# Patient Record
Sex: Female | Born: 1940 | Race: White | Hispanic: No | Marital: Married | Smoking: Current every day smoker
Health system: Southern US, Community
[De-identification: ages and names within clinical notes are randomized; demographics above are authoritative.]

## PROBLEM LIST (undated history)

## (undated) DIAGNOSIS — I480 Paroxysmal atrial fibrillation: Secondary | ICD-10-CM

## (undated) DIAGNOSIS — M549 Dorsalgia, unspecified: Secondary | ICD-10-CM

## (undated) DIAGNOSIS — Z720 Tobacco use: Secondary | ICD-10-CM

## (undated) DIAGNOSIS — I639 Cerebral infarction, unspecified: Secondary | ICD-10-CM

## (undated) DIAGNOSIS — H3530 Unspecified macular degeneration: Secondary | ICD-10-CM

## (undated) DIAGNOSIS — I2510 Atherosclerotic heart disease of native coronary artery without angina pectoris: Secondary | ICD-10-CM

## (undated) DIAGNOSIS — G8929 Other chronic pain: Secondary | ICD-10-CM

## (undated) DIAGNOSIS — H409 Unspecified glaucoma: Secondary | ICD-10-CM

## (undated) DIAGNOSIS — K219 Gastro-esophageal reflux disease without esophagitis: Secondary | ICD-10-CM

## (undated) DIAGNOSIS — E785 Hyperlipidemia, unspecified: Secondary | ICD-10-CM

## (undated) DIAGNOSIS — R002 Palpitations: Secondary | ICD-10-CM

## (undated) DIAGNOSIS — I10 Essential (primary) hypertension: Secondary | ICD-10-CM

## (undated) HISTORY — DX: Hyperlipidemia, unspecified: E78.5

## (undated) HISTORY — PX: TUBAL LIGATION: SHX77

## (undated) HISTORY — PX: CATARACT EXTRACTION: SUR2

## (undated) HISTORY — DX: Atherosclerotic heart disease of native coronary artery without angina pectoris: I25.10

## (undated) HISTORY — DX: Palpitations: R00.2

## (undated) HISTORY — DX: Tobacco use: Z72.0

## (undated) HISTORY — DX: Essential (primary) hypertension: I10

## (undated) HISTORY — DX: Unspecified macular degeneration: H35.30

## (undated) HISTORY — PX: APPENDECTOMY: SHX54

---

## 1999-08-18 ENCOUNTER — Ambulatory Visit (HOSPITAL_COMMUNITY): Admission: RE | Admit: 1999-08-18 | Discharge: 1999-08-18 | Payer: Self-pay | Admitting: Ophthalmology

## 2000-04-21 ENCOUNTER — Ambulatory Visit (HOSPITAL_COMMUNITY): Admission: RE | Admit: 2000-04-21 | Discharge: 2000-04-21 | Payer: Self-pay | Admitting: Cardiology

## 2000-06-07 ENCOUNTER — Ambulatory Visit (HOSPITAL_COMMUNITY): Admission: RE | Admit: 2000-06-07 | Discharge: 2000-06-07 | Payer: Self-pay | Admitting: Ophthalmology

## 2000-10-10 ENCOUNTER — Other Ambulatory Visit: Admission: RE | Admit: 2000-10-10 | Discharge: 2000-10-10 | Payer: Self-pay | Admitting: *Deleted

## 2000-10-21 ENCOUNTER — Encounter: Payer: Self-pay | Admitting: Emergency Medicine

## 2000-10-21 ENCOUNTER — Inpatient Hospital Stay (HOSPITAL_COMMUNITY): Admission: EM | Admit: 2000-10-21 | Discharge: 2000-10-23 | Payer: Self-pay | Admitting: Emergency Medicine

## 2000-10-25 ENCOUNTER — Ambulatory Visit (HOSPITAL_COMMUNITY): Admission: RE | Admit: 2000-10-25 | Discharge: 2000-10-25 | Payer: Self-pay | Admitting: Pulmonary Disease

## 2001-03-27 ENCOUNTER — Ambulatory Visit (HOSPITAL_COMMUNITY): Admission: RE | Admit: 2001-03-27 | Discharge: 2001-03-27 | Payer: Self-pay | Admitting: Internal Medicine

## 2001-09-16 ENCOUNTER — Inpatient Hospital Stay (HOSPITAL_COMMUNITY): Admission: EM | Admit: 2001-09-16 | Discharge: 2001-09-20 | Payer: Self-pay | Admitting: Internal Medicine

## 2001-09-16 ENCOUNTER — Encounter: Payer: Self-pay | Admitting: Internal Medicine

## 2001-09-20 ENCOUNTER — Encounter: Payer: Self-pay | Admitting: Internal Medicine

## 2001-11-02 ENCOUNTER — Ambulatory Visit (HOSPITAL_COMMUNITY): Admission: RE | Admit: 2001-11-02 | Discharge: 2001-11-02 | Payer: Self-pay | Admitting: Internal Medicine

## 2001-11-02 ENCOUNTER — Encounter: Payer: Self-pay | Admitting: Internal Medicine

## 2002-11-23 ENCOUNTER — Ambulatory Visit (HOSPITAL_COMMUNITY): Admission: RE | Admit: 2002-11-23 | Discharge: 2002-11-23 | Payer: Self-pay | Admitting: Internal Medicine

## 2002-11-23 ENCOUNTER — Encounter: Payer: Self-pay | Admitting: Internal Medicine

## 2002-11-28 ENCOUNTER — Ambulatory Visit (HOSPITAL_COMMUNITY): Admission: RE | Admit: 2002-11-28 | Discharge: 2002-11-28 | Payer: Self-pay | Admitting: Internal Medicine

## 2002-12-10 ENCOUNTER — Encounter (HOSPITAL_COMMUNITY): Admission: RE | Admit: 2002-12-10 | Discharge: 2003-01-09 | Payer: Self-pay | Admitting: Internal Medicine

## 2002-12-10 ENCOUNTER — Encounter: Payer: Self-pay | Admitting: Internal Medicine

## 2003-03-25 ENCOUNTER — Observation Stay (HOSPITAL_COMMUNITY): Admission: EM | Admit: 2003-03-25 | Discharge: 2003-03-26 | Payer: Self-pay | Admitting: *Deleted

## 2003-04-11 ENCOUNTER — Ambulatory Visit (HOSPITAL_COMMUNITY): Admission: RE | Admit: 2003-04-11 | Discharge: 2003-04-11 | Payer: Self-pay | Admitting: Internal Medicine

## 2003-07-17 ENCOUNTER — Ambulatory Visit (HOSPITAL_COMMUNITY): Admission: RE | Admit: 2003-07-17 | Discharge: 2003-07-17 | Payer: Self-pay | Admitting: Internal Medicine

## 2003-09-12 ENCOUNTER — Encounter (HOSPITAL_COMMUNITY): Admission: RE | Admit: 2003-09-12 | Discharge: 2003-10-12 | Payer: Self-pay | Admitting: Neurosurgery

## 2004-03-04 ENCOUNTER — Ambulatory Visit (HOSPITAL_COMMUNITY): Admission: RE | Admit: 2004-03-04 | Discharge: 2004-03-04 | Payer: Self-pay | Admitting: Internal Medicine

## 2004-06-11 ENCOUNTER — Ambulatory Visit: Payer: Self-pay | Admitting: *Deleted

## 2004-06-29 ENCOUNTER — Encounter (HOSPITAL_COMMUNITY): Admission: RE | Admit: 2004-06-29 | Discharge: 2004-07-29 | Payer: Self-pay | Admitting: *Deleted

## 2004-06-29 ENCOUNTER — Ambulatory Visit: Payer: Self-pay | Admitting: Cardiology

## 2004-07-01 ENCOUNTER — Ambulatory Visit: Payer: Self-pay | Admitting: *Deleted

## 2004-10-08 ENCOUNTER — Ambulatory Visit (HOSPITAL_COMMUNITY): Admission: RE | Admit: 2004-10-08 | Discharge: 2004-10-08 | Payer: Self-pay | Admitting: Internal Medicine

## 2005-03-24 ENCOUNTER — Ambulatory Visit: Payer: Self-pay | Admitting: Internal Medicine

## 2005-03-31 ENCOUNTER — Ambulatory Visit (HOSPITAL_COMMUNITY): Admission: RE | Admit: 2005-03-31 | Discharge: 2005-03-31 | Payer: Self-pay | Admitting: Internal Medicine

## 2005-03-31 ENCOUNTER — Ambulatory Visit: Payer: Self-pay | Admitting: Internal Medicine

## 2005-04-01 ENCOUNTER — Ambulatory Visit (HOSPITAL_COMMUNITY): Admission: RE | Admit: 2005-04-01 | Discharge: 2005-04-01 | Payer: Self-pay | Admitting: Internal Medicine

## 2005-06-14 ENCOUNTER — Ambulatory Visit: Payer: Self-pay | Admitting: Internal Medicine

## 2005-08-11 ENCOUNTER — Ambulatory Visit: Payer: Self-pay | Admitting: Internal Medicine

## 2005-08-11 ENCOUNTER — Encounter (INDEPENDENT_AMBULATORY_CARE_PROVIDER_SITE_OTHER): Payer: Self-pay | Admitting: Specialist

## 2005-08-11 ENCOUNTER — Ambulatory Visit (HOSPITAL_COMMUNITY): Admission: RE | Admit: 2005-08-11 | Discharge: 2005-08-11 | Payer: Self-pay | Admitting: Internal Medicine

## 2005-09-29 ENCOUNTER — Ambulatory Visit (HOSPITAL_COMMUNITY): Admission: RE | Admit: 2005-09-29 | Discharge: 2005-09-29 | Payer: Self-pay | Admitting: Internal Medicine

## 2005-12-10 ENCOUNTER — Ambulatory Visit (HOSPITAL_COMMUNITY): Admission: RE | Admit: 2005-12-10 | Discharge: 2005-12-10 | Payer: Self-pay | Admitting: Internal Medicine

## 2005-12-10 ENCOUNTER — Ambulatory Visit: Payer: Self-pay | Admitting: Internal Medicine

## 2006-01-13 ENCOUNTER — Ambulatory Visit (HOSPITAL_COMMUNITY): Admission: RE | Admit: 2006-01-13 | Discharge: 2006-01-13 | Payer: Self-pay | Admitting: Internal Medicine

## 2006-04-20 ENCOUNTER — Ambulatory Visit (HOSPITAL_COMMUNITY): Admission: RE | Admit: 2006-04-20 | Discharge: 2006-04-20 | Payer: Self-pay | Admitting: Internal Medicine

## 2007-01-02 ENCOUNTER — Ambulatory Visit (HOSPITAL_COMMUNITY): Admission: RE | Admit: 2007-01-02 | Discharge: 2007-01-02 | Payer: Self-pay | Admitting: Internal Medicine

## 2007-02-14 ENCOUNTER — Ambulatory Visit (HOSPITAL_COMMUNITY): Admission: RE | Admit: 2007-02-14 | Discharge: 2007-02-14 | Payer: Self-pay | Admitting: Internal Medicine

## 2007-03-23 ENCOUNTER — Ambulatory Visit (HOSPITAL_COMMUNITY): Admission: RE | Admit: 2007-03-23 | Discharge: 2007-03-23 | Payer: Self-pay | Admitting: Internal Medicine

## 2007-04-04 ENCOUNTER — Encounter (HOSPITAL_COMMUNITY): Admission: RE | Admit: 2007-04-04 | Discharge: 2007-05-04 | Payer: Self-pay | Admitting: Internal Medicine

## 2007-05-25 HISTORY — PX: COLONOSCOPY: SHX174

## 2007-06-16 ENCOUNTER — Ambulatory Visit (HOSPITAL_COMMUNITY): Admission: RE | Admit: 2007-06-16 | Discharge: 2007-06-16 | Payer: Self-pay | Admitting: Internal Medicine

## 2007-09-15 ENCOUNTER — Ambulatory Visit (HOSPITAL_COMMUNITY): Admission: RE | Admit: 2007-09-15 | Discharge: 2007-09-15 | Payer: Self-pay | Admitting: Internal Medicine

## 2009-01-08 ENCOUNTER — Ambulatory Visit (HOSPITAL_COMMUNITY): Admission: RE | Admit: 2009-01-08 | Discharge: 2009-01-08 | Payer: Self-pay | Admitting: Internal Medicine

## 2009-07-06 ENCOUNTER — Observation Stay (HOSPITAL_COMMUNITY): Admission: EM | Admit: 2009-07-06 | Discharge: 2009-07-08 | Payer: Self-pay | Admitting: Emergency Medicine

## 2009-07-06 ENCOUNTER — Encounter (INDEPENDENT_AMBULATORY_CARE_PROVIDER_SITE_OTHER): Payer: Self-pay | Admitting: General Surgery

## 2010-03-25 ENCOUNTER — Ambulatory Visit (HOSPITAL_COMMUNITY): Admission: RE | Admit: 2010-03-25 | Discharge: 2010-03-25 | Payer: Self-pay | Admitting: Internal Medicine

## 2010-06-14 ENCOUNTER — Encounter: Payer: Self-pay | Admitting: Internal Medicine

## 2010-06-14 ENCOUNTER — Encounter: Payer: Self-pay | Admitting: *Deleted

## 2010-08-12 LAB — GLUCOSE, CAPILLARY
Glucose-Capillary: 149 mg/dL — ABNORMAL HIGH (ref 70–99)
Glucose-Capillary: 165 mg/dL — ABNORMAL HIGH (ref 70–99)
Glucose-Capillary: 177 mg/dL — ABNORMAL HIGH (ref 70–99)
Glucose-Capillary: 203 mg/dL — ABNORMAL HIGH (ref 70–99)
Glucose-Capillary: 35 mg/dL — CL (ref 70–99)
Glucose-Capillary: 49 mg/dL — ABNORMAL LOW (ref 70–99)
Glucose-Capillary: 60 mg/dL — ABNORMAL LOW (ref 70–99)
Glucose-Capillary: 98 mg/dL (ref 70–99)

## 2010-08-12 LAB — CBC
HCT: 40 % (ref 36.0–46.0)
Hemoglobin: 13.5 g/dL (ref 12.0–15.0)
MCHC: 33.6 g/dL (ref 30.0–36.0)
RBC: 4.36 MIL/uL (ref 3.87–5.11)
WBC: 14.8 10*3/uL — ABNORMAL HIGH (ref 4.0–10.5)

## 2010-08-12 LAB — COMPREHENSIVE METABOLIC PANEL
ALT: 13 U/L (ref 0–35)
AST: 17 U/L (ref 0–37)
Alkaline Phosphatase: 51 U/L (ref 39–117)
CO2: 28 mEq/L (ref 19–32)
Creatinine, Ser: 0.81 mg/dL (ref 0.4–1.2)
GFR calc Af Amer: >60 mL/min (ref >60)
GFR calc non Af Amer: >60 mL/min (ref >60)
Glucose, Bld: 124 mg/dL — ABNORMAL HIGH (ref 70–99)
Potassium: 3.9 mEq/L (ref 3.5–5.1)
Sodium: 137 mEq/L (ref 135–145)

## 2010-08-12 LAB — URINALYSIS, ROUTINE W REFLEX MICROSCOPIC
Bilirubin Urine: NEGATIVE
Glucose, UA: NEGATIVE mg/dL
Ketones, ur: NEGATIVE mg/dL
pH: 5.5 (ref 5.0–8.0)

## 2010-08-12 LAB — URINE MICROSCOPIC-ADD ON

## 2010-08-12 LAB — DIFFERENTIAL
Basophils Relative: 0 % (ref 0–1)
Eosinophils Absolute: 0.2 10*3/uL (ref 0.0–0.7)
Eosinophils Relative: 2 % (ref 0–5)
Lymphocytes Relative: 10 % — ABNORMAL LOW (ref 12–46)
Lymphs Abs: 1.4 10*3/uL (ref 0.7–4.0)
Monocytes Relative: 4 % (ref 3–12)
Neutro Abs: 12.5 10*3/uL — ABNORMAL HIGH (ref 1.7–7.7)
Neutrophils Relative %: 85 % — ABNORMAL HIGH (ref 43–77)

## 2010-08-12 LAB — URINE CULTURE: Colony Count: 25000

## 2010-08-26 ENCOUNTER — Encounter: Payer: Self-pay | Admitting: Cardiology

## 2010-08-26 ENCOUNTER — Ambulatory Visit (INDEPENDENT_AMBULATORY_CARE_PROVIDER_SITE_OTHER): Payer: BC Managed Care – PPO | Admitting: Cardiology

## 2010-08-26 ENCOUNTER — Encounter: Payer: Self-pay | Admitting: *Deleted

## 2010-08-26 DIAGNOSIS — J45909 Unspecified asthma, uncomplicated: Secondary | ICD-10-CM | POA: Insufficient documentation

## 2010-08-26 DIAGNOSIS — R079 Chest pain, unspecified: Secondary | ICD-10-CM | POA: Insufficient documentation

## 2010-08-26 DIAGNOSIS — I359 Nonrheumatic aortic valve disorder, unspecified: Secondary | ICD-10-CM

## 2010-08-26 DIAGNOSIS — I10 Essential (primary) hypertension: Secondary | ICD-10-CM | POA: Insufficient documentation

## 2010-08-26 DIAGNOSIS — E785 Hyperlipidemia, unspecified: Secondary | ICD-10-CM | POA: Insufficient documentation

## 2010-08-26 DIAGNOSIS — H3530 Unspecified macular degeneration: Secondary | ICD-10-CM | POA: Insufficient documentation

## 2010-08-26 DIAGNOSIS — E119 Type 2 diabetes mellitus without complications: Secondary | ICD-10-CM

## 2010-08-26 DIAGNOSIS — Z720 Tobacco use: Secondary | ICD-10-CM | POA: Insufficient documentation

## 2010-08-26 DIAGNOSIS — R002 Palpitations: Secondary | ICD-10-CM

## 2010-08-26 DIAGNOSIS — I2510 Atherosclerotic heart disease of native coronary artery without angina pectoris: Secondary | ICD-10-CM | POA: Insufficient documentation

## 2010-08-26 MED ORDER — EZETIMIBE 10 MG PO TABS: 10.0000 mg | ORAL_TABLET | Freq: Every day | ORAL | Status: DC

## 2010-08-26 MED ORDER — NIACIN ER (ANTIHYPERLIPIDEMIC) 500 MG PO TBCR: 500.0000 mg | EXTENDED_RELEASE_TABLET | Freq: Every day | ORAL | Status: DC

## 2010-08-26 NOTE — Progress Notes (Signed)
HPI:  Barbara [name] is seen for initial evaluation of the kind request of Dr. Ouida Sills for evaluation of chest discomfort and palpitations.  These symptoms generally occur contemporaneously with a sense of rapid heartbeat and mild to moderate anterior chest burning radiating to the left neck and ear.  She lies down for a few minutes resulting in resolution of symptoms.  Chest discomfort is typically gone by the time that palpitations become imperceptible.  There is associated mild dyspnea but no diaphoresis nor nausea.  There is no relationship to exertion.  She also denies any relationship to body position, time of day, meals or any other factor she can identify.  Current Outpatient Prescriptions on File Prior to Visit  Medication Sig Dispense Refill  . aspirin 325 MG tablet Take 325 mg by mouth daily.        . insulin NPH (HUMULIN N) 100 UNIT/ML injection Inject 38 Units into the skin daily.        . insulin NPH-insulin regular (NOVOLIN 70/30) (70-30) 100 UNIT/ML injection Inject 4 Units into the skin 2 (two) times daily.        Marland Kitchen lisinopril (PRINIVIL,ZESTRIL) 40 MG tablet Take 40 mg by mouth daily.        . metFORMIN (GLUCOPHAGE) 500 MG tablet Take 500 mg by mouth daily.        . pravastatin (PRAVACHOL) 20 MG tablet Take 20 mg by mouth daily.        . niacin (NIASPAN) 500 MG CR tablet Take 1 tablet (500 mg total) by mouth at bedtime.  90 tablet  3    Allergies  Allergen Reactions  . Penicillins   . Sulfonamide Derivatives       Past Medical History  Diagnosis Date  . Hypertension   . Hyperlipidemia     total cholesterol of 271 and LDL of 187 in 07/2010  . CAD (coronary artery disease)     non-obstructive; negative stress nuclear and normal echo in 08/2009 by [facility] cardiac cath in 03/2000-20%  main 20% LAD, 30% circumflex, 30% RCA, hyperdynamic LV  . Macular degeneration   . Diabetes mellitus     A1c of 7.6 in 04/2010; moderate to high dose insulin  . Tobacco abuse   . Asthma      Mild     Past Surgical History  Procedure Date  . Cataract extraction     left     History reviewed. No pertinent family history.   History   Social History  . Marital Status: Married    Spouse Name: N/A    Number of Children: 1  . Years of Education: N/A   Occupational History  . retired     Interior and spatial designer   Social History Main Topics  . Smoking status: Current Everyday Smoker -- 0.5 packs/day    Types: Cigarettes  . Smokeless tobacco: Never Used  . Alcohol Use: No  . Drug Use: No  . Sexually Active: Not on file   Other Topics Concern  . Not on file   Social History Narrative  . No narrative on file     ROS:   Recurrent conjunctivitis on the left; macular degeneration; partial lower dentures; remote history of peptic ulcer disease with a history of gastric ulcers; all other systems reviewed and are negative.  PHYSICAL EXAM: BP 98/51  Pulse 80  Ht 5\' 3"  (1.6 m)  Wt 151 lb (68.493 kg)  BMI 26.75 kg/m2  SpO2 95%  General-Well-developed; no acute distress Body  habitus- Proportionate weight and height HEENT-[place]/AT; PERRL; EOM intact; conjunctiva and lids nl Neck-No JVD; right carotid bruit vs.transmitted murmur Endocrine-No thyromegaly Lungs-No tachypnea, clear without rales, rhonchi or wheezes Cardiovascular- normal PMI; normal S1 and S2; grade 2/6 early peaking systolic ejection murmur at the cardiac base Abdomen-BS normal; soft and non-tender without masses or organomegaly Musculoskeletal-No deformities, cyanosis or clubbing Neurologic-Nl cranial nerves; symmetric strength and tone Skin- Warm, no sig. lesions Extremities-Nl distal pulses; no edema  EKG:   Normal sinus rhythm; left atrial abnormality; right ventricular conduction delay; no previous tracing for comparison.  ASSESSMENT AND PLAN:

## 2010-08-26 NOTE — Patient Instructions (Signed)
Your physician recommends that you schedule a follow-up appointment in: 6 weeks Your physician has recommended you make the following change in your medication: begin zetia 10mg  daily, niaspan 500mg  at bedtime  Your physician recommends that you return for lab work in: 1 month  Your physician has requested that you have a stress echocardiogram. For further information please visit [email or web address] Please follow instruction sheet as given.  Your physician has requested that you have an echocardiogram. Echocardiography is a painless test that uses sound waves to create images of your heart. It provides your doctor with information about the size and shape of your heart and how well your heart's chambers and valves are working. This procedure takes approximately one hour. There are no restrictions for this procedure.  Your physician has recommended that you wear an event monitor. Event monitors are medical devices that record the heart's electrical activity. Doctors most often [place] these monitors to diagnose arrhythmias. Arrhythmias are problems with the speed or rhythm of the heartbeat. The monitor is a small, portable device. You can wear one while you do your normal daily activities. This is usually used to diagnose what is causing palpitations/syncope (passing out).

## 2010-08-26 NOTE — Assessment & Plan Note (Signed)
Patient understands the adverse effects of cigarette smoking.  This topic will be discussed in detail at future visits.

## 2010-08-26 NOTE — Assessment & Plan Note (Signed)
Blood pressure is excellent; current medication will be continued.

## 2010-08-26 NOTE — Assessment & Plan Note (Signed)
Chest discomfort is associated with palpitations and dust may be caused by arrhythmia rather than coronary disease.  For further evaluation, event recording and a stress echocardiogram will be undertaken.

## 2010-08-26 NOTE — Assessment & Plan Note (Signed)
Recent lipid profile values were quite suboptimal.  Patient has only been able to tolerate 10 mg of pravastatin.  Non-statin agents will be necessary.  Will start ezetimibe at a dose of 10 mg q.d. And Niaspan 500 mg q.d.  A lipid profile will be repeated in one month.

## 2010-08-26 NOTE — Assessment & Plan Note (Signed)
Event recorder will be provided as noted above.

## 2010-08-28 DIAGNOSIS — R55 Syncope and collapse: Secondary | ICD-10-CM

## 2010-09-02 ENCOUNTER — Ambulatory Visit (HOSPITAL_COMMUNITY)
Admission: RE | Admit: 2010-09-02 | Discharge: 2010-09-02 | Disposition: A | Discharge status: Home / Self Care | Payer: Medicare Other | Source: Ambulatory Visit | Attending: Cardiology | Admitting: Cardiology

## 2010-09-02 ENCOUNTER — Ambulatory Visit (HOSPITAL_COMMUNITY): Payer: Medicare Other

## 2010-09-02 DIAGNOSIS — I10 Essential (primary) hypertension: Secondary | ICD-10-CM | POA: Insufficient documentation

## 2010-09-02 DIAGNOSIS — I2510 Atherosclerotic heart disease of native coronary artery without angina pectoris: Secondary | ICD-10-CM

## 2010-09-02 DIAGNOSIS — R002 Palpitations: Secondary | ICD-10-CM | POA: Insufficient documentation

## 2010-09-02 DIAGNOSIS — E119 Type 2 diabetes mellitus without complications: Secondary | ICD-10-CM | POA: Insufficient documentation

## 2010-09-02 DIAGNOSIS — R072 Precordial pain: Secondary | ICD-10-CM

## 2010-09-26 LAB — LIPID PANEL
Cholesterol: 219 mg/dL — ABNORMAL HIGH (ref 0–200)
LDL Cholesterol: 127 mg/dL — ABNORMAL HIGH (ref 0–99)
Total CHOL/HDL Ratio: 5.8 Ratio
VLDL: 54 mg/dL — ABNORMAL HIGH (ref 0–40)

## 2010-09-29 ENCOUNTER — Encounter: Payer: Self-pay | Admitting: *Deleted

## 2010-09-29 NOTE — Progress Notes (Signed)
Result letter to pt.

## 2010-10-08 ENCOUNTER — Encounter: Payer: Self-pay | Admitting: Cardiology

## 2010-10-08 ENCOUNTER — Ambulatory Visit (INDEPENDENT_AMBULATORY_CARE_PROVIDER_SITE_OTHER): Payer: Medicare Other | Admitting: Cardiology

## 2010-10-08 DIAGNOSIS — I2510 Atherosclerotic heart disease of native coronary artery without angina pectoris: Secondary | ICD-10-CM

## 2010-10-08 DIAGNOSIS — I679 Cerebrovascular disease, unspecified: Secondary | ICD-10-CM

## 2010-10-08 DIAGNOSIS — R002 Palpitations: Secondary | ICD-10-CM

## 2010-10-08 MED ORDER — ACEBUTOLOL HCL 200 MG PO CAPS: 200.0000 mg | ORAL_CAPSULE | Freq: Two times a day (BID) | ORAL | Status: DC

## 2010-10-08 NOTE — Assessment & Plan Note (Signed)
No symptoms to suggest active myocardial ischemia; no evidence for progression of disease.

## 2010-10-08 NOTE — Assessment & Plan Note (Addendum)
Patient is quite sensitive to increased frequency of PACs, which occurs frequently.  She has experienced weakness while taking moderate dose metoprolol.  Sectral will be started at a dose of 200 mg b.i.d in substitution for metoprolol.

## 2010-10-08 NOTE — Progress Notes (Signed)
HPI : Barbara [name] is a pleasant woman currently undergoing evaluation for palpitations.  Since her last visit, she has only intermittently been troubled with mild palpitations which lasts a matter of seconds.  They're sometimes associated with dyspnea and lightheadedness.  She occasionally notes chest discomfort.  Current Outpatient Prescriptions on File Prior to Visit  Medication Sig Dispense Refill  . aspirin 325 MG tablet Take 325 mg by mouth daily.        Marland Kitchen ezetimibe (ZETIA) 10 MG tablet Take 1 tablet (10 mg total) by mouth daily.  90 tablet  3  . insulin NPH (HUMULIN N) 100 UNIT/ML injection Inject 38 Units into the skin daily.        . insulin NPH-insulin regular (NOVOLIN 70/30) (70-30) 100 UNIT/ML injection Inject 4 Units into the skin 2 (two) times daily.        Marland Kitchen lisinopril (PRINIVIL,ZESTRIL) 40 MG tablet Take 40 mg by mouth daily.        . pravastatin (PRAVACHOL) 20 MG tablet Take 20 mg by mouth daily.        Marland Kitchen DISCONTD: metFORMIN (GLUCOPHAGE) 500 MG tablet Take 500 mg by mouth daily.        Marland Kitchen DISCONTD: niacin (NIASPAN) 500 MG CR tablet Take 1 tablet (500 mg total) by mouth at bedtime.  90 tablet  3     Allergies  Allergen Reactions  . Penicillins   . Sulfonamide Derivatives       Past medical history, social history, and family history reviewed and updated.  ROS: No orthopnea, PND or, lightheadedness or syncope.  PHYSICAL EXAM: BP 106/60  Pulse 64  Ht 5\' 4"  (1.626 m)  Wt 151 lb (68.493 kg)  BMI 25.92 kg/m2  SpO2 95%  General-Well developed; no acute distress Body habitus-proportionate weight and height Neck-No JVD; Right carotid bruit Lungs-clear lung fields; resonant to percussion; modest systolic ejection murmur Cardiovascular-normal PMI; normal S1 and S2 Abdomen-normal bowel sounds; soft and non-tender without masses or organomegaly Musculoskeletal-No deformities, no cyanosis or clubbing Neurologic-Normal cranial nerves; symmetric strength and tone Skin-Warm, no  significant lesions Extremities-distal pulses intact; no edema  Event recorder-normal sinus rhythm; sinus bradycardia; sinus arrhythmia; frequent PACs; short runs of supraventricular tachycardia; a single 4 beat run of ventricular tachycardia, all without reported symptoms.  ASSESSMENT AND PLAN:

## 2010-10-08 NOTE — Patient Instructions (Signed)
Your physician recommends that you schedule a follow-up appointment in: 3 weeks Your physician has recommended you make the following change in your medication: stop metoprolol, begin sectral 200mg  twice daily Call for adverse effects if any

## 2010-10-09 DIAGNOSIS — I679 Cerebrovascular disease, unspecified: Secondary | ICD-10-CM | POA: Insufficient documentation

## 2010-10-09 NOTE — Discharge Summary (Signed)
[place]. [facility]  Patient:    Barbara [name], Barbara [name]                      MRN: [id number] Adm. Date:  [id number] Attending:  Ivor Messier                           Discharge Summary  NO DICTATION DD:  08/18/99 TD:  08/18/99 Job: [id number]

## 2010-10-09 NOTE — H&P (Signed)
NAME:  Barbara [name], Barbara [name]                         ACCOUNT NO.:  [id number]   MEDICAL RECORD NO.:  [id number]                   PATIENT TYPE:   LOCATION:                                       FACILITY:  [facility]   PHYSICIAN:  Lionel December, M.D.                 DATE OF BIRTH:  [birth year]   DATE OF ADMISSION:  11/22/2002  DATE OF DISCHARGE:                                HISTORY & PHYSICAL   CHIEF COMPLAINT:  Rectal bleeding.   HISTORY OF PRESENT ILLNESS:  Barbara [name] is a 70 year old Caucasian female who  presents today for further evaluation of rectal bleeding and epigastric  pain.  She called in on November 14, 2002, with complaints of rectal bleeding.  She has had intermittent rectal bleeding chronically.  Her last colonoscopy  was in November of 2002, at which time she had a single small polyp in the  cecum.  She had multiple small polyps in the rectum.  The cecal polyp was a  tubular adenoma.  She was advised followup colonoscopy in five years.  She  complains of intermittent rectal bleeding.  Last week she had to wear an  undergarment pad.  She was having bleeding without a bowel movement.  Volume  was small.  Denies any constipation, diarrhea, and melena.  She complains of  intermittent epigastric pain which has been going on for months.  It is  worse with eating.  She has nausea and rare vomiting.  She occasionally has  heartburn symptoms.  Denies any dysphagia or odynophagia.  She has an  abdominal ultrasound scheduled for tomorrow.   CURRENT MEDICATIONS:  1. Zocor 20 mg daily.  2. Aspirin 325 mg daily.  3. Novolin N 40 units daily.  4. Aciphex 20 mg daily, started today.  5. Vitamin E 1000 units daily.  6. Vitamin C 1000 units daily.  7. Caltrate 600 mg b.i.d.   ALLERGIES:  1. PENICILLIN.  2. SULFA.   PAST MEDICAL HISTORY:  1. Diabetes mellitus.  2. CVA two years ago.  3. Hypercholesterolemia.   PAST SURGICAL HISTORY:  1. Tubal ligation.  2. Cardiac catheterization.  3.  Three colonoscopies with a history of tubular adenoma as outlined above.  4. Two eye surgeries.   FAMILY HISTORY:  Mother died of MI.  Father died of throat cancer.  She has  two sisters who died of breast cancer and one who died of kidney failure.  A  brother with prostate cancer.  No family history of colorectal cancer.   SOCIAL HISTORY:  She has been married for 46 years.  She has one daughter  who required colectomy due to constipation/obstipation.  She is on  disability.  She quit smoking six months ago.  Denies any alcohol use.   REVIEW OF SYSTEMS:  GASTROINTESTINAL:  Please HPI.  GENERAL:  Denies any  weight loss.  CARDIOPULMONARY:  Denies any shortness of breath  or chest  pain.   PHYSICAL EXAMINATION:  WEIGHT:  147 pounds.  HEIGHT:  5 feet 4 inches.  VITAL SIGNS:  Temperature 98.5 degrees, blood pressure 118/68, pulse 78.  GENERAL APPEARANCE:  A pleasant, well-nourished, well-developed, Caucasian  female in no acute distress.  SKIN:  Warm and dry.  No jaundice.  HEENT:  Conjunctivae are pink.  Sclerae nonicteric.  Oropharyngeal mucosa  moist and pink.  No lesions, erythema, or exudate.  NECK:  No lymphadenopathy or thyromegaly.  CHEST:  Lungs clear to auscultation.  CARDIAC:  Regular rate and rhythm.  Normal S1 and S2.  No murmurs, rubs, or  gallops.  ABDOMEN:  Positive bowel sounds.  Soft and nondistended.  She has mild  epigastric tenderness to deep palpation.  No organomegaly or masses.  RECTAL:  Examination deferred.  The patient states that Dr. Ouida Sills did rectal  exam and was found to have hemorrhoids.  EXTREMITIES:  No edema.   IMPRESSION:  The patient is a pleasant 70 year old lady with a history of  colonic tubular adenomas, last colonoscopy in November of 2002.  She  continues to have intermittent small volume hematochezia, which is feel is  mostly due to benign anorectal source.  She complains of intermittent  postprandial epigastric pain associated with nausea.   She has occasional  heartburn.  Symptoms may be due to gastrointestinal reflux disease, peptic  ulcer disease, or biliary etiology.  I do not suspect there is a correlation  with her rectal bleeding and her epigastric pain.  Given the chronicity of  her symptoms, I have offered an upper endoscopy today.  She does have  gallbladder workup in progress.  I have advised her that if it turns out  that she is felt to have acute gallbladder disease, we can cancel the upper  endoscopy.  With regards to rectal bleeding, at this time I do not feel that  she needs to have a colonoscopy.  It has only been a year-and-a-half.  For  the patient's reassurance, I will discuss further with Dr. Karilyn Cota as well.   PLAN:  1. We will scheduled an EGD.  2. She will proceed with abdominal ultrasound as planned and ordered by Dr.     Ouida Sills.  3. Begin Aciphex 20 mg p.o. daily.  Medication provided by Dr. Ouida Sills.  4. Will check three Hemoccults at times when there is no obvious rectal     bleeding.  5.     Will discuss further with Dr. Karilyn Cota with regards to possible colonoscopy.     I feel at this time that repeat exam is not warranted.  6. She has hemorrhoid cream/suppositories at home which she may use p.r.n.     Tana Coast, P.A.                        Lionel December, M.D.    LL/MEDQ  D:  11/22/2002  T:  11/22/2002  Job:  [id number]   cc:   Kingsley Callander. Ouida Sills, M.D.  [address]  [place]  [place] [address]  Fax: [phone]   Lionel December, M.D.  [address]  [place]  [place] [address]  Fax: [phone]

## 2010-10-09 NOTE — H&P (Signed)
[place]. [facility]  Patient:    Barbara [name], Barbara [name]                      MRN: [id number] Adm. Date:  [id number] Disc. Date: [id number] Attending:  Ivor Messier                         History and Physical  INDICATIONS:  This was a planned outpatient surgical admission of this 70 year old insulin-dependent diabetic admitted for cataract implant surgery of the left eye.  HISTORY OF PRESENT ILLNESS:  This patient has been followed in my office for care since May 03, 1994.  At that time, the patient had been told that she had cataract formation in the left eye.  Over the ensuing year, the very incipient cataract became more obvious and mature, reducing her vision to 20/200.  The patient has elected to proceed with cataract implant surgery at this time, noting extreme photophobia and glare problems with associated blurred vision and decreased vision in the left eye.  She has signed an informed consent and arrangements made for her outpatient admission at this time.  PAST MEDICAL HISTORY:  The patient is under the care of Dr. Ouida Sills.  Her current  medications include the above-noted insulin, Novolin N insulin.  Otherwise, the  patient states she is in excellent general health.  REVIEW OF SYSTEMS:  No cardiorespiratory complaints.  PHYSICAL EXAMINATION:  VITAL SIGNS:  As recorded on admission.  Blood pressure 157/71, temperature 98.5, pulse 78, respirations 16.  GENERAL:  The patient is a pleasant, well-nourished, well-developed 70 year old  white female admitted for cataract implant surgery of the left eye.  HEENT:  Eyes:  Visual acuity as noted above.  External ocular and slitlamp examination:  Nuclear cataract formation greater in the left than right eye. Also, some component of posterior subcapsular cataract change.  Applanation tonometry  24 mm right eye, 23 left eye.  Refraction:  No help in improving vision. Fundus examination:   Dilated with Mydriacyl ophthalmic solution, cataract haze both eyes. The retina is attached with normal optic nerve, blood vessels, and macula.  No diabetic retinopathy is seen.  CHEST:  Lungs clear to percussion and auscultation.  HEART:  Normal sinus rhythm.  No cardiomegaly, no murmurs.  ABDOMEN:  Negative.  EXTREMITIES:  Negative.  ADMISSION DIAGNOSES:  Senile cataract both eyes, posterior subcapsular type. 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  SURGICAL PLAN:  Planned extracapsular cataract extraction with primary insertion of posterior chamber intraocular lens implant. DD:  08/18/99 TD:  08/18/99 Job: [id number]

## 2010-10-09 NOTE — H&P (Signed)
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 and urinated  all over herself.  After this, she was found to have significant right arm  and right leg weakness and inability to get out of the bed.  She then  promptly took two aspirins.  Her blood sugar upon arrival to the hospital  was 75, and she was noted to have 2/5 weakness in right upper extremity and  right lower extremity.  She is admitted for impending CVA/TIA.  She denies  any antecedent palpitations, chest pain, dizziness, or diaphoresis.  CT scan  in the ER was essentially unremarkable except for left sphenoid sinusitis  which may be chronic.   PAST MEDICAL HISTORY:  1. Insulin-dependent diabetes.  2. Hyperlipidemia.  3. Status post CVA.  4. Asthma.  5. History of borderline hypertension.   PAST SURGICAL HISTORY:  Unremarkable.   SOCIAL HISTORY:  She drinks alcohol occasionally.  She is married.  She  smoked one pack per day for 30 years, she quit two years ago.   CURRENT MEDICATIONS:  1. NPH insulin 38 units in the morning.  2. Aspirin 81 mg in the day.  3. Zocor 20 mg per day.  4. Albuterol inhaler p.r.n.   ALLERGIES:  1. PENICILLIN, rash.  2. SULFA, rash.   PHYSICAL EXAMINATION:  VITAL SIGNS:  Blood pressure 170/64, pulse 72 and  regular, temperature 97.6, O2 sat is 99%.  HEENT:  Head is normocephalic, atraumatic.  Eyes:  PERRLA.  Extraocular  movements  intact.  Sclerae clear.  Conjunctivae pink.  Throat shows no  erythema or exudates.  NECK:  Shows no JVD, no carotid bruit, no thyromegaly, no thyroid bruits.  LUNGS:  Show prolonged expiratory phase, no rales, rhonchi, or wheezes  appreciable.  HEART:  Regular rhythm, no murmurs, gallops, heaves, thrills, or rubs.  ABDOMEN:  Soft and nontender.  Bowel sounds are normoactive.  No guarding,  no rebound, no masses, no megaly.  EXTREMITIES:  No cyanosis, clubbing, or edema.  NEUROLOGIC:  Cranial nerves II-XII grossly intact.  Right arm and right leg  have 2/5 motor strength.  Plantar is downgoing on the left and equivocal on the right.  Reflexes are  1+ and symmetrical.   IMPRESSION:  1. Impending cerebrovascular accident/transient ischemic attack with some     partial resolution.  2. Insulin-dependent diabetes.  3. Status post cerebrovascular accident with mild right hemiparesis from two     years ago.  4. Hyperlipidemia.  5. Borderline hypertension.  6. Asthma.   PLAN:  Admit, Lovenox, aspirin, Zocor, get MRI in the morning, carotid  ultrasounds.  I will make further recommendations as the database expands.     ___________________________________________                                         Melvyn Novas, MD   RMD/MEDQ  D:  03/25/2003  T:  03/25/2003  Job:  [id number]

## 2010-10-09 NOTE — Op Note (Signed)
NAMECAGNEY, STEENSON               ACCOUNT NO.:  [id number]   MEDICAL RECORD NO.:  [id number]          PATIENT TYPE:  AMB   LOCATION:  DAY                           FACILITY:  [facility]   PHYSICIAN:  Lionel December, M.D.    DATE OF BIRTH:  Nov 03, 1940   DATE OF PROCEDURE:  08/11/2005  DATE OF DISCHARGE:                                 OPERATIVE REPORT   PROCEDURE:  Colonoscopy with polypectomy.   INDICATIONS:  Barbara [name] is 70 year old Caucasian female who has recurrent  hematochezia. She has history of colonic polyp. She had a cecal adenoma  removed in 2 attempts back in 2002. Her last colonoscopy was in November,  2002. Family history is negative for colorectal carcinoma.   Procedure and risks were reviewed with the patient and informed consent was  obtained.   MEDS FOR CONSCIOUS SEDATION:  Demerol 50 mg IV Versed 6 mg IV in divided  dose.   FINDINGS:  Procedure performed in endoscopy suite. The patient's vital signs  and O2 saturations were monitored during procedure and remained stable. The  patient was placed in the left lateral position and rectal examination  performed. No abnormality noted on external or digital exam. The patient was  placed in the left lateral position and rectal examination performed. No  abnormality noted on external or digital exam. Olympus videoscope was placed  in the rectum. Rectal mucosa was normal. There were a few tiny hyperplastic  polyps which were left alone.   The scope was retroflexed to examine anorectal junction and hemorrhoids were  noted below the dentate line. Endoscope was straightened and advanced to the  sigmoid colon and beyond. Preparation was satisfactory. Scope was passed to  cecum which was identified by ileocecal valve and appendiceal orifice.  Pictures taken for the record. There was a large polyp with a sessile  component was behind the fold, difficult to see. Part of this polyp could  not be elevated with submucosal injection of  normal saline. The part which  was bulky was snared piecemeal. The part which was difficult to see was  treated partially with APC in a short pulse mode. All of the pieces that  were snared were retrieved for histologic examination. As the scope was  withdrawn colonic mucosa was examined, for the second time, and there no  other abnormalities. Endoscope was withdrawn. The patient tolerated the  procedure well.   FINAL DIAGNOSIS:  1.  Cecal polyp on a fold across the ileocecal valve with bulky and a      sessile component.  This polyp was treated both with combination of      snare polypectomy and APC. Part of the polyp has not been treated; it is      a difficult approach.  2.  External hemorrhoids felt to be the source of hematochezia.   RECOMMENDATIONS:  She will resume her ASA in 5 days. Anusol-HC suppository 1  per rectum at bedtime for 2 weeks. She will a keep stool diary as to  frequency of bleeding episodes. If histology reveals benign polyp. She will  return for repeat  exam in 2-3 months.      Lionel December, M.D.  Electronically Signed     NR/MEDQ  D:  08/11/2005  T:  08/12/2005  Job:  [id number]   cc:   Kingsley Callander. Ouida Sills, MD  Fax: [phone]

## 2010-10-09 NOTE — H&P (Signed)
[facility]  Patient:    Barbara [name], Barbara [name] Visit Number: [id number] MRN: [id number]          Service Type: MED Location: 3A A331 01 Attending Physician:  Cassell Smiles. Dictated by:   Kari Baars, M.D. Admit Date:  09/16/2001   CC:         John Giovanni, M.D.  Carylon Perches, M.D.   History and Physical  REASON FOR ADMISSION:  Possible pyelonephritis.  HISTORY OF PRESENT ILLNESS:  This is a 70 year old who was in her usual state of fairly poor health at home when she developed chills today.  She says that she has not felt exactly right the last day or two.  When she came to the emergency room she was noted to have a temperature of 104 orally and was treated with Tylenol, etc., but appeared to have a possible pyelonephritis. She was complaining of back pain in the right flank area for about a week or so.  She had chills, as mentioned, prior to coming to the emergency room, saID that she is feeling a little better now.  She denies actually any dysuria. She has not been coughing.  She has not had any sputum production.  PAST MEDICAL HISTORY: 1. Diabetes. 2. Hyperlipidemia. 3. Hypertension. 4. Glaucoma. 5. History of cataract surgery. 6. Cataract, which showed normal left ventricular function and no significant    obstructive lesions. 7. Colon polyps remained in 2002. 8. Tubal ligation. 9. Multiple rib fractures in the past.  MEDICATIONS: 1. Novolin N 38 units daily. 2. Vitamin C. 3. Zinc. 4. Vitamin E. 5. Aspirin 1 daily. 6. She apparently has been on Zocor in the past.  ALLERGIES:  SULFA and PENICILLIN.  SOCIAL HISTORY:  She had about an 80-pack-year smoking history.  She does not use alcohol.  She lives at home with her husband.  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  REVIEW OF SYSTEMS:  Otherwise essentially negative.  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  PHYSICAL  EXAMINATION:  VITAL SIGNS:  On admission temperature was 104.1, pulse 109, respirations 28, blood pressure 152/79.  HEENT:  Pupils are equal, round, and reactive to light and accommodation. Nose and throat clear.  NECK:  Supple.  Without masses, bruits, jugular venous distention.  CHEST:  Clear.  Without wheezes, rales, or rhonchi.  HEART:  Regular.  Without murmur, gallop, or rub.  ABDOMEN:  Flank is minimally tender.  Abdomen is nontender.  EXTREMITIES:  Show no edema.  CNS:  Grossly intact.  LABORATORY DATA:  Urine shows moderate hemoglobin, positive urine nitrite, positive leukocyte esterase.  Sodium 133, glucose 343, potassium 4.2, chloride 100, CO2 24.  Liver enzymes are normal.  White count 6400, 87% neutrophils, 12 lymphocytes, hemoglobin 13.5, hematocrit 41.  ASSESSMENT:  She has probably a pyelonephritis.  PLAN:  Admit for IV treatment.  She is going to be on sliding scale insulin. IV antibiotics. Dictated by:   Kari Baars, M.D. Attending Physician:  Cassell Smiles DD:  09/16/01 TD:  09/17/01 Job: [id number]

## 2010-10-09 NOTE — Op Note (Signed)
[place]. [facility]  Patient:    Barbara [name], Barbara [name]                      MRN: [id number] Proc. Date: 08/18/99 Adm. Date:  [id number] Disc. Date: [id number] Attending:  Ivor Messier                           Operative Report  PREOPERATIVE DIAGNOSIS:  Posterior subcapsular cataract left eye.  POSTOPERATIVE DIAGNOSIS:  Posterior subcapsular cataract left eye.  OPERATION:  Planned extracapsular cataract extraction--phacoemulsification, primary insertion of posterior chamber intraocular lens implant.  SURGEON:  Guadelupe Sabin, M.D.  ASSISTANT:  Nurse.  ANESTHESIA:  Local 4% Xylocaine, 0.75 Marcaine, retrobulbar block, topical tetracaine, intraocular Xylocaine.  Anesthesia standby required in this diabetic patient.  The patient had an elevated blood sugar at 356 and was receiving an insulin drip.  She was seen by Dr. Zoila Shutter, the attending anesthesiologist, and felt to be in satisfactory condition for the proposed surgery under local anesthesia.  OPERATIVE PROCEDURE:  After the patient was prepped and draped, a speculum was inserted in the left eye.  The eye was turned downward and a superior rectus traction suture placed.  Schiotz tonometry was recorded at 6-7 scale units with a 5.5 g weight.  A peritomy was performed adjacent to the limbus from the 11 to 1  oclock position.  The corneoscleral junction was cleaned and a corneoscleral groove made with a 45-degree Superblade.  The anterior chamber was then entered  with the diamond 2.5 mm keratome at the 12 oclock position and the 15-degree blade at the 2:30 position.  Using a bent 26-gauge needle on a Healon syringe, a circular capsulorrhexis was begun and then completed with the Gradle forceps. Hydrodissection and hydrodelineation were performed using 1% Xylocaine.  The 30-degree phacoemulsification tip was then inserted with slow emulsification of the lens nucleus.  Total ultrasonic  time 1 minute 41 seconds.  Average power level 7%. Total amount of fluid used during the emulsification 50 cc.  Following removal f the nucleus, the residual cortex and posterior capsule were aspirated. Posterior capsule plaque was aspirated.  The posterior capsule appeared intact with a brilliant red fundus reflex.  It was therefore elected to insert an [employer] [id number] silicone posterior chamber intraocular lens implant with V absorber.  Diopter strength +20.00.  This was inserted with the McDonald forceps into the anterior chamber and then centered into the capsular bag using the [facility] lens rotator.  The lens appeared to be well-centered.  The Healon, which had been used during the procedure, was then aspirated and replaced with balanced salt solution with a small amount of added Miochol.  The operative incision appeared to be self-sealing and no sutures were required.  The conjunctiva was brushed forward over the operative incision.  Eserine ophthalmic ointment was instilled in the conjunctival cul-de-sac and a light patch and protector shield applied. Duration of procedure and anesthesia administration 45 minutes.  The patient tolerated the procedure well in general and left the operating room for the main recovery room. The patient was taken to the main recovery room due to her elevated blood sugar and the intravenous insulin drip continued until her blood sugar stabilized so that she could go home. DD:  08/18/99 TD:  08/18/99 Job: [id number]

## 2010-10-09 NOTE — Discharge Summary (Signed)
[facility]  Patient:    Barbara [name], Barbara [name] Visit Number: [id number] MRN: [id number]          Service Type: MED Location: 3A A331 01 Attending Physician:  Carylon Perches Dictated by:   Carylon Perches, M.D. Admit Date:  09/16/2001 Discharge Date: 09/20/2001                             Discharge Summary  SUBJECTIVE:  She continues to experience fever and right flank pain.  She is eating much better now.  She has right CVA tenderness which is fairly mild on examination. Her abdomen is otherwise benign.  Chest clear.  She has wanted to use albuterol on a p.r.n. basis.  No wheezes.  Her chest x-ray is clear. Heart regular with no murmurs.  She feels some peripheral edema, but really has no significant edema noticeable.  IMPRESSION:  Escherichia coli pyelonephritis and bacteremia.  Her sensitivities show a wide range of sensitivities to all antibiotics tested. Will continue Levaquin for now.  Will obtain an ultrasound of the kidney primarily to exclude any obstruction or abscess formation on the right side where her pain persists.  Will reduce her IV fluids. Dictated by:   Carylon Perches, M.D. Attending Physician:  Carylon Perches DD:  09/19/01 TD:  09/20/01 Job: [id number]

## 2010-10-09 NOTE — Assessment & Plan Note (Signed)
Right carotid bruit noted; no history of neurologic symptoms.  Patient reports periodic carotid ultrasounds; however, I can only locate a study from 2004 at which time she had mild atherosclerosis without significant focal disease.

## 2010-10-09 NOTE — H&P (Signed)
NAMEWEN, Barbara [name]               ACCOUNT NO.:  [id number]   MEDICAL RECORD NO.:  [id number]          PATIENT TYPE:  AMB   LOCATION:                                FACILITY:  [facility]   PHYSICIAN:  Lionel December, M.D.    DATE OF BIRTH:  1940-10-18   DATE OF ADMISSION:  06/14/2005  DATE OF DISCHARGE:  LH                                HISTORY & PHYSICAL   PRESENTING COMPLAINT:  Abdominal pain and rectal bleeding.   Barbara [name] is a 70 year old Caucasian female patient of Dr. Ouida Sills who was there  for a scheduled visit.  She was last seen on March 24, 2005.  At that time  she was complaining of epigastric pain and intermittent hematochezia which  she had for over 5 months.  Her hematochezia was felt to be secondary to  hemorrhoids.  She was asked to keep better records.  As far as her  epigastric pain is concerned, she has history of peptic ulcer disease, and  she is status post therapy for H. pylori gastritis in the past.  She had EGD  on March 31, 2005.  She had nonerosive antral gastritis, otherwise normal  EGD.  Felt that gastritis would not explain her pain.  She then had upper  abdominal ultrasound which was within normal limits.  She had been  prescribed Levosin .  Initially she thought it made her nauseated, but it  has helped with discomfort.  She states she is not having any more  epigastric pain, but she has continued to pass blood per rectum.  As  planned, she has kept a written record.  She had 5 episodes in November, 2  episodes in December, and only 1 episode this month.  She usually passes  blood with a bowel movement, at times she passed blood just with urination.  Only on 1 occasion it was dark blood; the rest of the time, it was bright  red.  It is small to moderate in amount.  She also complains of a stabbing  pain across her lower abdomen which is intermittent and does not last too  long and seemed to be relieved with a bowel movement or passing flatus.  She  has a good  appetite.  Last week she had non-bloody diarrhea for 3 days.  She  feels she may have had a virus.  She is now back to having normal stools.  She is maintaining her weight.  No history of frank melena, and her  heartburn is well controlled with therapy.   MEDICATIONS:  1.  ASA 325 mg daily.  2.  Novolin N 36 units q.a.m.  3.  Albuterol inhaler 2 puffs 4 times a day p.r.n.  4.  Flax seed oil 1 g daily.  5.  Fish oil 1 g daily.  6.  Vitamin C 500 mg daily.  7.  Caltrate 600 with vitamin D x2 daily.  8.  Vitamin D daily.  9.  Nexium 40 mg q.a.m.  10. Novolin R via sliding scale.  11. Lisinopril 40 mg daily.  12. Pepto Bismol p.r.n.  13. Lescol 80 mg daily.  14. Fibercaps 2 tablets daily.   PAST MEDICAL HISTORY:  1.  History of gastric ulcer diagnosed in July 2004.  She also had      gastroduodenitis.  She is status post therapy for H. pylori gastritis.      Followup EGD in November revealed this ulcer to have healed completely.      Recent EGD April 10, 2005, was negative for peptic ulcer disease and      revealed nonerosive antral gastritis.  2.  History of colonic polyps.  She had a cecal adenoma removed in February      2002.  A residual polyp was removed in November 2002 (broadly      hyperplastic and broadly adenomatous). She is scheduled to undergo      surveillance colonoscopy later this year.  3.  She has been diabetic for over 30 years.  4.  Hyperlipidemia.  5.  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  6.  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  7.  Tubal ligation.  8.  Bilateral cataract extraction.   ALLERGIES:  PENICILLIN and SULFA.   FAMILY HISTORY:  Positive for various malignancies.  Father died of  laryngeal carcinoma at age 46.  Two sisters died of breast carcinoma, one at  age 77 and the other one at age 38.  One brother died last year of stomach  cancer at age 3.  One sister died of renal failure.   SOCIAL  HISTORY:  She is married.  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  She has a daughter who has been treated for breast carcinoma and  remains in remission (age 18).  She smoked cigarettes for many years but  quit 3 years ago.  She drinks alcohol very occasionally.   OBJECTIVE:  VITAL SIGNS:  150 pounds.  She is 5 feet 4 inches tall.  Pulse  80 per minute.  Blood pressure 122/62, temperature 98.9.  HEENT:  Conjunctivae pink, sclerae nonicteric.  Oropharyngeal mucosa is  normal.  She has a complete upper and partial lower plate.  NECK:  No neck masses or thyromegaly noted.  CARDIAC: Regular rhythm, normal S1 and S2.  No murmur or gallop noted.  LUNGS:  Clear to auscultation.  ABDOMEN: Full but symmetrical.  She has areas of ecchymosis __________.  No  bruits noted.  On palpation, it is soft with mild tenderness at both iliac  fossa as well as mid epigastric area.  No organomegaly or masses noted.  RECTAL:  Exam was deferred.  EXTREMITIES: No peripheral edema or clubbing noted.   ASSESSMENT:  1.  Rectal bleeding.  This is chronic, suspected to be secondary to      hemorrhoids.  However, she does not have chronic bowel irregularity.  I      suspect that this is hemorrhoidal.  Since it is recurrent, we need to go      ahead and reexamine her colon now instead of waiting to the end of the      year.  She will have to hold off ASA just for 2 days.  She is agreeable.  2.  History of gastric ulcer and epigastric pain.  Recently EGD was      negative.  She appears to be doing better with p.r.n. use of Levosin SL      not mentioned above.   PLAN:  Colonoscopy to be performed at [facility] in the near  future.  Procedure risks reviewed with the patient.  She will only take 18  to 20 units of Novolin N the day before but continue with sliding scale.  She is also advised to drink a sugar drink or orange juice should her sugar drop.  She knows to check on the day of procedure  to make sure she is not  hyperglycemic.  She will be given cautious sedation for the procedure.      Lionel December, M.D.  Electronically Signed     NR/MEDQ  D:  06/14/2005  T:  06/14/2005  Job:  [id number]   cc:   Kingsley Callander. Ouida Sills, MD  Fax: [phone]   [facility]  Fax: [phone]

## 2010-10-09 NOTE — Op Note (Signed)
[name], [name]               ACCOUNT NO.:  [id number]   MEDICAL RECORD NO.:  [id number]          PATIENT TYPE:  AMB   LOCATION:  DAY                           FACILITY:  [facility]   PHYSICIAN:  Lionel December, M.D.    DATE OF BIRTH:  10/29/1940   DATE OF PROCEDURE:  03/31/2005  DATE OF DISCHARGE:                                 OPERATIVE REPORT   PROCEDURE:  Esophagogastroduodenoscopy.   INDICATIONS:  Barbara [name] is 70 year old Caucasian female with a five-month  history of epigastric pain. She has history of gastric ulcer which was  documented to have healed completely two years ago. She is on ASA for  antiplatelet function, but she is also on PPI daily. She is undergoing  diagnostic EGD. Procedure risks were reviewed with the patient, and informed  consent was obtained.   PREMEDICATION:  Cetacaine spray for oropharyngeal topical anesthesia,  Demerol 50 mg IV, Versed 5 mg IV in divided dose.   FINDINGS:  Procedure performed in endoscopy suite. The patient's vital signs  and O2 saturation were monitored during the procedure and remained stable.  The patient was placed in left lateral position, and Olympus videoscope was  passed via oropharynx without any difficulty into the esophagus.   Esophagus. Mucosa of the esophagus normal. GE junction was a 41 cm.   Stomach. It was empty and distended very well with insufflation. Folds of  proximal stomach were normal. Examination of mucosa revealed focal  prepyloric erythema, but no erosions or ulcers were noted. Angularis, fundus  and cardia were examined by retroflexing scope and were normal.   Duodenum. Bulbar mucosa was normal. Scope was passed to the second part of  duodenum where mucosa and folds were normal. Endoscope was withdrawn. The  patient tolerated the procedure well.   FINAL DIAGNOSIS:  Nonerosive antral gastritis, otherwise normal EGD. No  evidence of recurrent peptic ulcer disease. This gastritis would not explain  her  pain.   PLAN:  1.  She will continue Nexium as before.  2.  Levsin SL t.i.d. p.r.n.  3.  Upper abdominal ultrasound. thanks      Lionel December, M.D.  Electronically Signed     NR/MEDQ  D:  03/31/2005  T:  03/31/2005  Job:  [id number]

## 2010-10-09 NOTE — Op Note (Signed)
NAME:  Barbara [name], BLUESTONE                         ACCOUNT NO.:  [id number]   MEDICAL RECORD NO.:  [id number]                   PATIENT TYPE:  AMB   LOCATION:  DAY                                  FACILITY:  [facility]   PHYSICIAN:  Lionel December, M.D.                 DATE OF BIRTH:  04-25-1941   DATE OF PROCEDURE:  11/28/2002  DATE OF DISCHARGE:                                  PROCEDURE NOTE   PROCEDURE:  Esophagogastroduodenoscopy with esophageal dilatation.   ENDOSCOPIST:  Lionel December, M.D.   INDICATIONS:  Taytem is a 70 year old Caucasian female who has recent  epigastric pain.  Recent ultrasound was negative.  She also has been having  intermittent hematochezia, but this is felt to be due to hemorrhoids and her  last colonoscopy was in November 2002 and I, therefore, recommended not  proceeding with another colonoscopy.  The procedure and risks were reviewed  with the patient and informed consent was obtained.   PREOPERATIVE MEDICATIONS:  Cetacaine spray for oropharyngeal topical  anesthesia, Demerol 50 mg IV and Versed 5 mg IV in divided dose.   INSTRUMENT:  Olympus video system.   FINDINGS:  Procedure performed in endoscopy suite.  The patient's vital  signs and O2 saturation were monitored during the procedure and remained  stable.  The patient was placed in the left lateral recumbent position and  endoscope was passed via the oropharynx without any difficulty into the  esophagus.   ESOPHAGUS:  Mucosa of the esophagus was normal.  There was a tiny island of  gastric-type mucosa proximal to the GE junction which was wavy. There was no  ring or stricture noted.  The stomach was empty and distended with _____  insufflation.   STOMACH:  The folds in the proximal stomach were normal.  Examination of the  mucosa revealed a few antral erosions along with prepyloric ulcer about 8-10  mm in maximal diameter and had a clean base.  Pyloric channel was patent.  Angularis and fundus  were examined by retroflexing the scope and were  normal.   DUODENUM:  Examination of the bulb revealed patchy mucosal edema and  erythema, but no ulcers or erosions were noted.  The mucosal and folds of  the second part of the duodenum were normal. The endoscope was pulled back  into the stomach.  A stomach biopsy was taken from this ulcer margin.   Endoscope was withdrawn.  The patient tolerated the procedure well.   FINAL DIAGNOSES:  1. An 8-10 mm gastric ulcer in prepyloric area.  2. Erosive gastritis.  3. Bulbar duodenitis.    RECOMMENDATIONS:  1. H. pylori serology will be checked.  She will continue Aciphex at 20 mg     p.o. q.a.m.  2. As discussed with Dr. Ouida Sills, over the phone, ASA was decreased for the     time being.  It will be reduced to  81 mg daily.  3. Will bring her back in 10 weeks to document complete healing of the     ulcer.  Once this is documented she possibly could go back on 325 mg of     ASA daily, but she will need to stay on PPI chronically.                                               Lionel December, M.D.    NR/MEDQ  D:  11/28/2002  T:  11/29/2002  Job:  [id number]   cc:   Kingsley Callander. Ouida Sills, M.D.  [address]  [place]  [place] [address]  Fax: [phone]

## 2010-10-09 NOTE — Group Therapy Note (Signed)
[facility]  Patient:    Barbara [name], Barbara [name] Visit Number: [id number] MRN: [id number]          Service Type: MED Location: 3A A331 01 Attending Physician:  Cassell Smiles. Dictated by:   Kari Baars, M.D. Proc. Date: 09/17/01 Admit Date:  09/16/2001   CC:         Carylon Perches, M.D.   Progress Note  PROBLEM:  Presumed pyelonephritis.  SUBJECTIVE:  Ms. Ellenberger says she feels a great deal better this morning.  She is afebrile now.  She is still complaining of back pain.  She is on Vicodin for that and the Vicodin has not totally resolved her back pain, but she does feel better.  She has no other new complaints at this point.  OBJECTIVE:  Her examination shows that her chest is clear.  She is still tender in the back.  Her heart is regular.  She is afebrile.  Blood pressure 110/70, pulse about 80.  LABORATORY DATA:  White count 10,700, hemoglobin 11.1, platelets 174.  Glucose 69, BUN 10, creatinine 0.8.  Chloride 110, CO2 of 24, sodium 137, potassium 3.5.  Her blood sugars have been very well-controlled in general.  She does have a positive blood culture that preliminarily is showing gram-positive cocci.  ASSESSMENT:  It is not totally clear that what she has at this point is pyelonephritis, but I did not see a definite infiltrate on her chest x-ray, and she did have an abnormal urinalysis.  PLAN:  She is afebrile now, so I am not going to change her medication from the Levaquin that she is currently getting.  After the organism is known, then perhaps she may need a second antibiotic.  Will plan to continue with the current treatments and increase her Vicodin to two q.i.d. p.r.n. for pain. No other changes. Dictated by:   Kari Baars, M.D. Attending Physician:  Cassell Smiles DD:  09/17/01 TD:  09/18/01 Job: [id number]

## 2010-10-09 NOTE — Cardiovascular Report (Signed)
[place]  Patient:    Barbara [name], Barbara [name]                      MRN: [id number] Proc. Date: 04/21/00 Adm. Date:  [id number] Attending:  Learta Codding CC:         Carylon Perches, M.D.  Thomas C. Wall, M.D. [facility]   Cardiac Catheterization  PROCEDURE:  Coronary Arteriography.  CARDIOLOGIST:  Noralyn Pick. Eden Emms, M.D. [facility]  INDICATION:  Chest pain in a diabetic of over 30 years.  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  Left anterior descending artery had 20% multiple discrete lesions in the proximal and mid portion.  Distal vessel had a 40% discrete lesion.  The circumflex coronary artery had 30% multiple discrete lesions in the mid and distal portion.  Right coronary artery was dominant.  There were 30% multiple discrete lesions in the proximal, mid, and distal vessel.  The PDA had 20% multiple discrete lesions.  RAO VENTRICULOGRAPHY:  RAO ventriculography showed hyperdynamic LV function. The ejection fraction was in excess of 80%.  There was no gradient across the aortic valve and no MR.  Aortic pressure was 110/50.  LV pressure was 110/4.  IMPRESSION:  The patients chest pain would appear to be noncardiac in etiology.  She will be discharged home later today to follow up with Dr. Ouida Sills. DD:  04/21/00 TD:  04/21/00 Job: [id number]

## 2010-10-09 NOTE — Discharge Summary (Signed)
   NAME:  Barbara [name], Barbara [name]                         ACCOUNT NO.:  [id number]   MEDICAL RECORD NO.:  [id number]                   PATIENT TYPE:  INP   LOCATION:  A201                                 FACILITY:  [facility]   PHYSICIAN:  Kingsley Callander. Ouida Sills, M.D.                  DATE OF BIRTH:  [birth year]   DATE OF ADMISSION:  03/25/2003  DATE OF DISCHARGE:  03/26/2003                                 DISCHARGE SUMMARY   DISCHARGE DIAGNOSES:  1. Transient ischemic attack.  2. Diabetes.  3. Hyperlipidemia.  4. History of stroke.   PROCEDURES:  1. CT scan of the head.  2. MRI of the head.  3. Carotid ultrasound.   HOSPITAL COURSE:  This patient is a 70 year old female who presented with  weakness in the right arm and leg and difficulty walking.  A CT scan in the  emergency room revealed no acute intracranial abnormality.  Her symptoms  resolved.  She was treated with Lovenox and aspirin.  She was monitored on  2A with telemetry and had no arrhythmias.  She underwent ultrasound of her  carotids which revealed no hemodynamically significant stenosis.  She had an  MRI of her brain which revealed no acute intracranial abnormality.   Her diabetes was treated with Humulin N and sliding scale regular insulin.   She had a hemoglobin of 13.8.  Her renal function was normal with a  creatinine of 0.6.  She had previously been on ACE inhibitor but had stopped  it due to low blood pressures at home.  She has continued Zocor and aspirin  at home.   Since her neurological exam returned to baseline, she was felt to be stable  for discharge on March 26, 2003.  She will be seen in followup in the  office in two weeks.   DISCHARGE MEDICATIONS:  1. Zocor 20 mg q.h.s.  2. Aspirin 81 mg daily.  3. Novolin 38 units daily.  4. Prevacid 30 mg daily.     ___________________________________________                                         Kingsley Callander. Ouida Sills, M.D.   ROF/MEDQ  D:  03/26/2003  T:  03/26/2003  Job:   [id number]

## 2010-10-09 NOTE — H&P (Signed)
NAME:  Barbara [name], Barbara [name]               ACCOUNT NO.:  [id number]   MEDICAL RECORD NO.:  [id number]          PATIENT TYPE:  AMB   LOCATION:                                FACILITY:  [facility]   PHYSICIAN:  Lionel December, M.D.    DATE OF BIRTH:  1941/05/20   DATE OF ADMISSION:  DATE OF DISCHARGE:  LH                                HISTORY & PHYSICAL   PRESENTING COMPLAINTS:  Recurrent epigastric pain, rectal bleeding.   HISTORY OF PRESENT ILLNESS:  Barbara [name] is a 70 year old Caucasian female,  patient of Dr. Ouida Sills, who was here for a scheduled visit  .  She states for  the last five months she has been having epigastric pain off and on, at  times it is significant, other times it is mild.  She has not found any  association with her meals.  The pain is always relieved with Pepto-Bismol.  She is concerned her ulcer is back.  She is on ASA for antiplatelet function  but does not take any other OTC NSAIDs.  She denies nausea, vomiting,  anorexia, or weight loss.  In fact, she has been gradually gaining weight.  She says this is because she is not able to exercise much.  She also  complains of rectal bleeding off and on which she has had for five months.  She passes bright red blood without any red bowel movements.  Sometimes she  urinates and she squirts blood.  Other times she finds herself bleeding  taking a shower.  She denies constipation but has occasional diarrhea.  She  states she has not seen even a drop of blood in the last three weeks.   She is presently on:  1.  ASA 325 mg every day.  2.  Novolin N 36 units in the a.m.  3.  Novolin R via sliding scale.  4.  Vitamin E 1,000 units every day.  5.  Albuterol inhaler two puffs q.i.d. p.r.n.  6.  Flax seed oil 1 gram every day.  7.  Fish oil 1 gram every day.  8.  Vitamin C 500 mg every day.  9.  Caltrate 600 with D every day.  10. Tums two every day.  11. Nexium 40 mg q.a.m.  12. Lipitor 10 mg every day.  13. Lisinopril 40 mg every day.  14. Pepto-Bismol one b.i.d.   PAST MEDICAL HISTORY:  1.  History of gastric ulcer which was initially diagnosed, in July 2004,      when she presented with post prandial epigastric pain and nausea.  This      was a benign ulcer.  She has been treated for H. pylori gastritis.  She      returned for a followup EGD in November and also was noted to have      healed completely, but she was noted to have a few erosions.  2.  History of colonic polyps, last colonoscopy was in February 2002.  She      had a cecal polyp which was partially excised.  She returned for  followup colonoscopy, in November 2002, and the rest of the polyp was      removed.  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  She is due for followup colonoscopy in      one year.  3.  Diabetes mellitus.  4.  Hyperlipidemia.  5.  She has had CVA x3.  She states it has affected her short term memory.  6.  She has intermittent back pain.  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   PRIOR SURGERIES:  1.  Tubal ligation.  2.  She has had two eye surgeries.   ALLERGIES:  1.  PENICILLIN.  2.  SULFA.   FAMILY HISTORY:  Negative for colorectal carcinoma but positive for CAD in  her mother.  Her father died of unknown carcinoma at age 77.  One sister had  breast carcinoma, another died of renal failure.   SOCIAL HISTORY:  She is married.  She has a daughter, age 62, who has been  treated for breast carcinoma and remains in remission.  She is on  disability.  She smoked for years but quit about three years ago.  She  drinks alcohol rarely.   PHYSICAL EXAMINATION:  VITAL SIGNS:  Weight 150/5 pounds.  She is 54 inches  tall.  Pulse 80 per minute.  Blood pressure 130/62.  Temp is 98.0.  HEENT:  Conjunctivae is pink.  Sclerae is nonicteric.  Oropharyngeal mucosa  is normal.  She has complete upper and a partial lower plate.  NECK:  No neck masses or thyromegaly noted.  CARDIAC:  Regular rhythm.  Normal S1 and S2.  No  murmur or gallop noted.  LUNGS:  Clear to auscultation.  ABDOMEN:  Symmetrical and soft with mild mid epigastric tenderness.  No  organomegaly or masses.  RECTAL:  Reveals a small central skin tag.  The digital exam is normal.  She  has formed stool in the vault which is brown but guaiac positive.  EXTREMITIES:  No peripheral edema or clubbing noted.   ASSESSMENT:  Barbara [name] is a 71 year old Caucasian female with two  gastrointestinal issues.  1.  Recurrent epigastric pain.  She has a history of gastric ulcer which was      documented to have healed completely in November 2004.  She has been      treated for Helicobacter pylori gastritis with ten days of double dose      proton pump inhibitor, Biaxin, and Flagyl since she is allergic to      penicillin.  Her symptoms are suggestive of recurrent peptic ulcer      disease.  Her risk factors include chronic ASA therapy.  2.  Intermittent rectal bleeding of five months' duration.  Suspect this is      secondary to hemorrhoids but other lesions need to be ruled out.  Since      she has not bled for the last three weeks, we will monitor her for now.      Her colonoscopy may have to be done now rather than a year from now.   RECOMMENDATIONS:  1.  Diagnostic esophagogastroduodenoscopy to be performed at [facility] in the near future.  2.  She will have CBC at that time.  3.  She will keep a written record as to the frequency of these bleeding      episodes.  Whether or not she will need a colonoscopy in  the near future      will depend on whether or not she has frequent bleed.      Lionel December, M.D.  Electronically Signed     NR/MEDQ  D:  03/24/2005  T:  03/24/2005  Job:  [id number]   cc:   Kingsley Callander. Ouida Sills, MD  Fax: [phone]   [facility]  Fax: [phone]

## 2010-10-09 NOTE — Group Therapy Note (Signed)
   NAME:  Barbara [name], Barbara [name]                         ACCOUNT NO.:  [id number]   MEDICAL RECORD NO.:  [id number]                   PATIENT TYPE:  INP   LOCATION:  A201                                 FACILITY:  [facility]   PHYSICIAN:  Kingsley Callander. Ouida Sills, M.D.                  DATE OF BIRTH:  19-Oct-1940   DATE OF PROCEDURE:  DATE OF DISCHARGE:                                   PROGRESS NOTE   [name] was admitted during the night after awakening with weakness on  her right side.  She had involuntarily voided as well.  There had been no  witnessed seizure activity.  She was evaluated in the emergency room with a  CT scan which revealed no acute intracranial process.   At the time of my exam her strength has returned to normal on the right  side.  She is alert and oriented.  Chest clear.  Heart regular, with no  murmurs.  No carotid bruits.   IMPRESSION:  1. Transient ischemic attack:  She has been started on Lovenox.  She has     been on aspirin and Zocor.  She had previously been on an ACE inhibitor     but stopped it, she states, because of low blood pressures at home.  An     MRI and a carotid ultrasound were ordered by the admitting physician.  2. Diabetes:  Will reduce her Novolin N dose while she is in the hospital     and treat with sliding scale short-acting insulin as needed based on a.c.     and h.s. Accu-Cheks.      ___________________________________________                                            Kingsley Callander. Ouida Sills, M.D.   ROF/MEDQ  D:  03/26/2003  T:  03/26/2003  Job:  [id number]

## 2010-10-09 NOTE — H&P (Signed)
[place]  Patient:    Barbara [name], Barbara [name]                     MRN: [id number] Adm. Date:  06/07/00 Attending:  Guadelupe Sabin, M.D.                         History and Physical  This was a planned outpatient surgical readmission of this 70 year old white female insulin-dependent diabetic admitted for cataract implant surgery of the right eye.  HISTORY OF PRESENT ILLNESS:  This patient had noted deterioration of vision in both eyes due to cataract formation.  The patient was previously admitted to this hospital on August 13, 1999, and had cataract implant surgery performed of the left eye without complications.  The patient tolerated the procedure well and vision returned to 20/20.  Meanwhile the unoperated eye had deteriorated to 20/50 to 20/65 and the patient has elected to proceed with similar surgery. She signed an informed consent and arrangements were made for her outpatient admission at this time.  PAST MEDICAL HISTORY:  The patient is under the care of Dr. Ouida Sills.  The patient resides in [place], [place].  Dr. Ouida Sills feels the patient has a past history of diabetes, hyperlipidemia, hypercholesterolemia.  She has been followed by her cardiologist and is said to be in satisfactory condition for the proposed surgery.  REVIEW OF SYSTEMS:  No current cardiorespiratory complaints.  PHYSICAL EXAMINATION:  GENERAL:  The patient is a pleasant, well-nourished, well-developed white female in no acute ocular distress.  VITAL SIGNS:  Blood pressure 127/64, temperature 98.4, pulse 73, respirations 18.  HEENT:  Eyes; visual acuity as noted above.  External ocular and slit lamp examination; the eyes are white and clear with nuclear cataract formation right eye and a well-centered posterior chamber interocular lens implant in the left eye.  Fundus examination reveals a clear vitreous, attached retina, with normal optic nerve and blood vessels.   No diabetic retinopathy is present.  The macula appears clear.  CHEST:  Lungs clear to auscultation and percussion.  HEART:  Normal sinus rhythm with no cardiomegaly and no murmurs.  ABDOMEN:  Negative.  EXTREMITIES:  Negative.  ADMISSION DIAGNOSIS: 1. Senile cataract, right eye. 2. Pseudophakia, left eye. 3. Diabetes mellitus, insulin-dependent type.  PLAN:  Cataract implant surgery right eye. DD:  06/07/00 TD:  06/07/00 Job: [id number]

## 2010-10-09 NOTE — Op Note (Signed)
Barbara [name], Barbara [name]               ACCOUNT NO.:  [id number]   MEDICAL RECORD NO.:  [id number]          PATIENT TYPE:  AMB   LOCATION:  DAY                           FACILITY:  [facility]   PHYSICIAN:  Lionel December, M.D.    DATE OF BIRTH:  04/03/1941   DATE OF PROCEDURE:  12/10/2005  DATE OF DISCHARGE:                                 OPERATIVE REPORT   PROCEDURE:  Colonoscopy with APC of residual cecal polyp.   INDICATION:  [name] is 70 year old Caucasian female who has history of  colonic polyps.  She underwent surveillance colonoscopy in March of 2007.  She had cecal polyp which was partly polypoid and partially sessile.  It was  treated in piecemeal fashion.  It was a benign adenoma.  She is now  returning just to make sure she does not have a residual polyp and if she  does, it will be treated.  Procedure was reviewed with the patient and  informed consent was obtained.   MEDICATIONS FOR CONSCIOUS SEDATION:  Demerol 50 mg IV and Versed 5 mg IV.   FINDINGS:  Procedure performed in endoscopy suite.  The patient's vital  signs and O2 sats were monitored during the procedure and remained stable.  The patient was placed in the left lateral recumbent position and rectal  examination performed.  No abnormality noted on external or digital exam.  Olympus videoscope was placed in the rectum, advanced under vision into  sigmoid colon and beyond.  Preparation was satisfactory.  Scope was passed  into cecum which was identified by ileocecal valve and appendix appendiceal  orifice.  There was some residual polyp to the right of appendiceal orifice  and there was a small residual polyp above that.  All of these were treated  with APC.  The area behind the valve on the right side was clearly seen and  there was no more residual polyp left.  Pictures taken for the record.  As  the scope was withdrawn, colonic mucosa was once again carefully examined.  There no other lesions noted.  There were a few  tiny hyperplastic appearing  polyps in the rectum which were left alone.  Scope was retroflexed to  examine anorectal junction and hemorrhoids were noted below the dentate  line.  Endoscope was straightened and withdrawn.  The patient tolerated the  procedure well.   FINAL DIAGNOSES:  1.  Examination performed to cecum.  2.  Residual cecal polyp ablated with the use of argon plasma coagulator.  3.  External hemorrhoids.   RECOMMENDATIONS:  1.  Standard instructions given.  She will resume her ASA in 3 days.  I am      afraid to keep her off it longer given history of CVA.  2.  She will have yearly Hemoccults and repeat exam in 3 years from now.      Lionel December, M.D.  Electronically Signed     NR/MEDQ  D:  12/10/2005  T:  12/10/2005  Job:  [id number]   cc:   Kingsley Callander. Ouida Sills, MD  Fax: [phone]

## 2010-10-09 NOTE — Op Note (Signed)
NAME:  Barbara [name], Barbara [name]                         ACCOUNT NO.:  [id number]   MEDICAL RECORD NO.:  [id number]                   PATIENT TYPE:  AMB   LOCATION:  DAY                                  FACILITY:  [facility]   PHYSICIAN:  Lionel December, M.D.                 DATE OF BIRTH:  Jan 08, 1941   DATE OF PROCEDURE:  DATE OF DISCHARGE:                                 OPERATIVE REPORT   PROCEDURE:  Esophagogastroduodenoscopy.   ENDOSCOPIST:  Lionel December, M.D.   INDICATIONS:  Barbara [name] is a 70 year old Caucasian female who was found to have  large gastric ulcer at antrum about 14 weeks ago.  She has been treated for  H. pylori gastritis.  She remains on PPI but still has intermittent pain.  She was hospitalized recently for what was felt to be TIA. She is presently  on ASA 81 mg daily and it is felt that she needs a higher dose.  She is  undergoing EGD to document complete healing of this ulcer before therapy  change.  Procedure and risks were reviewed with the patient and informed  consent was obtained.   PREOPERATIVE MEDICATIONS:  Demerol 50 mg IV and Versed 5 mg IV in divided  dose.   FINDINGS:  Procedure performed in endoscopy suite.  The patient's vital  signs and O2 saturation were monitored during the procedure and remained  stable.  The patient was placed in the left lateral recumbent position and  Olympus videoscope was passed via the oropharynx without any difficulty into  the esophagus.   ESOPHAGUS:  Mucosa of the esophagus was normal throughout.  Squamocolumnar  junction was unremarkable.   STOMACH:  It was empty and distended very well with insufflation.  The folds  of the proximal stomach were normal.  Examination of the mucosa revealed a  few antral erosions, but antral/prepyloric ulcer had completely healed.  The  pyloric channel was patent.  Angularis, fundus, and cardia were examined by  retroflexing the scope and were normal.   DUODENUM:  Examination of the bulb and  postbulbar duodenum was normal.   The endoscope was withdrawn.  The patient tolerated the procedure well.   FINAL DIAGNOSIS:  The gastric ulcer has completely healed, but she still has  a few antral erosions.   RECOMMENDATIONS:  1. She will continue Nexium 40 mg q.a.m. as long as she is on ASA.  2. I feel that the benefit of a higher dose of ASA exceeds the risk; and,     therefore, the dose will be increased to 325     mg daily with meals.  3. The patient advised not to take any OTC NSAID.  Should she develop a     tarry stool she needs to get in contact with Dr. Ouida Sills or myself.      ___________________________________________  Lionel December, M.D.   NR/MEDQ  D:  04/11/2003  T:  04/11/2003  Job:  [id number]

## 2010-10-09 NOTE — Op Note (Signed)
[place]. [facility]  Patient:    Barbara [name], Barbara [name]                     MRN: [id number] Proc. Date: 06/07/00 Adm. Date:  06/07/00 Attending:  Guadelupe Sabin, M.D.                           Operative Report  PREOPERATIVE DIAGNOSIS:  Senile cataract, right eye.  POSTOPERATIVE DIAGNOSIS:  Senile cataract, right eye.  PROCEDURE:  Planned extracapsular cataract extraction - phakoemulsification, primary insertion of posterior chamber interocular lens implant.  SURGEON:  Guadelupe Sabin, M.D.  ASSISTANT:  Nurse.  ANESTHESIA:  Local 4% Xylocaine, 0.75% Marcaine retrobulbar block, topical Tetracaine, interocular Xylocaine.  Anesthesia standby required in this diabetic patient.  The patient given sodium Pentothal intravenously during the period of retrobulbar injection.  DESCRIPTION OF PROCEDURE:  After the patient was prepped and draped, a lid speculum was inserted in the right eye.  The eye was turned downward and a superior rectus traction suture placed.  Schiotz tonometry was recorded at 6 to 7 scale units with a 5.5 gram weight.  A peritomy was performed adjacent to the limbus from 11 to 1 oclock position.  The corneoscleral junction was cleaned and a corneoscleral groove made with a 45 degree Superblade.  The anterior chamber was then entered with a 2.5 mm diamond Keratome at the 12 oclock position and the 15 degree blade at the 2:30 position.  Using a bent 26 gauge needle on a Helon syringe, a circular capsulorrhexis was begun and then completed with the Graebo forceps.  Hydrodissection and hydrodelineationw were performed using 1% Xylocaine.  The 30 degree phacoemulsification tip was then inserted with slow controlled emulsification of the lens nucleus.  Total ultrasonic time 43 seconds, average power level 11%, total amount of fluid used 30 cc.  Following removal of the nucleus, the residual cortex was aspirated with the irrigation aspiration tip.   The posterior capsule appeared intact with a brilliant red fundus reflex.  It was therefore elected to insert an [employer] [id number] silicon posterior chamber interocular lens implant with UV absorber.  Diopter strength +21.50.  This was inserted with the McDonald forceps into the anterior chamber and then centered into the capsular bag using the [facility] lens rotator.  The lens appeared to be well centered.  The Helon which had been used during the procedure was then aspirated and replaced with balanced salt solution and Miochol ophthalmic solution.  The operative incisions appeared to be self sealing and no sutures were required.  Maxitrol ointment was instilled in the conjunctival cul-de-sac and a light patch and protector shield applied.  Duration of the procedure and anesthesia administration was 45 minutes.  The patient tolerated the procedure well in general and left the operating room for the recovery room in good condition. DD:  06/07/00 TD:  06/07/00 Job: [id number]

## 2010-11-02 ENCOUNTER — Ambulatory Visit: Payer: Medicare Other | Admitting: Cardiology

## 2010-12-03 ENCOUNTER — Encounter: Payer: Self-pay | Admitting: Cardiology

## 2010-12-03 ENCOUNTER — Ambulatory Visit (INDEPENDENT_AMBULATORY_CARE_PROVIDER_SITE_OTHER): Payer: Medicare Other | Admitting: Cardiology

## 2010-12-03 DIAGNOSIS — I10 Essential (primary) hypertension: Secondary | ICD-10-CM

## 2010-12-03 DIAGNOSIS — R002 Palpitations: Secondary | ICD-10-CM

## 2010-12-03 DIAGNOSIS — E785 Hyperlipidemia, unspecified: Secondary | ICD-10-CM

## 2010-12-03 MED ORDER — METOPROLOL SUCCINATE ER 25 MG PO TB24: 25.0000 mg | ORAL_TABLET | Freq: Every day | ORAL | Status: DC

## 2010-12-03 NOTE — Patient Instructions (Signed)
**Note De-identified Barbara [name] Obfuscation** Your physician recommends that you continue on your current medications as directed. Please refer to the Current Medication list given to you today.  Your physician recommends that you schedule a follow-up appointment in: 6 months  

## 2010-12-03 NOTE — Assessment & Plan Note (Signed)
Blood pressure stable ? ?

## 2010-12-03 NOTE — Assessment & Plan Note (Signed)
Recent lipid panel reviewed with patient. This is treated by Dr. Ouida Sills who she is scheduled to see.

## 2010-12-03 NOTE — Assessment & Plan Note (Signed)
Patient had a reaction after one dose a sacral and stopped it. She is doing quite well on Toprol XL 25 mg once daily. Her palpitations have lessened. We will continue with this dose.

## 2010-12-03 NOTE — Progress Notes (Signed)
GLO:VFIE is a 70 year old white female patient who is here for followup of palpitations. She last saw Dr. Dietrich Pates Oct 12, 2010 complaining of palpitations. She had been experiencing pain and weakness while taking moderate doses of metoprolol so he substituted Sectral 200 mg b.i.d. After taking her first dose she became quite short of breath and claims to have developed a severe allergic reaction. She start metoprolol 25 mg once daily and is doing much better. Her palpitations are much better controlled and she denies any weakness symptoms associated with it. She has no cardiac complaints today.  The patient does continue to smoke a pack of cigarettes every 3 days.  Allergies  Allergen Reactions  . Penicillins   . Sulfonamide Derivatives     Current Outpatient Prescriptions on File Prior to Visit  Medication Sig Dispense Refill  . Ascorbic Acid (VITAMIN C) 1000 MG tablet Take 1,000 mg by mouth daily.        Marland Kitchen aspirin 325 MG tablet Take 325 mg by mouth daily.        . Calcium Carbonate (CALTRATE 600 PO) Take 1 tablet by mouth daily.        . Cholecalciferol (VITAMIN D) 1000 UNITS capsule Take 1,000 Units by mouth daily.        . Coenzyme Q10 (CO Q 10) 100 MG CAPS Take 1 tablet by mouth daily.        Marland Kitchen ezetimibe (ZETIA) 10 MG tablet Take 1 tablet (10 mg total) by mouth daily.  90 tablet  3  . insulin NPH (HUMULIN N) 100 UNIT/ML injection Inject 38 Units into the skin daily.        . insulin NPH-insulin regular (NOVOLIN 70/30) (70-30) 100 UNIT/ML injection Inject 4 Units into the skin 2 (two) times daily.        Marland Kitchen lisinopril (PRINIVIL,ZESTRIL) 40 MG tablet Take 40 mg by mouth daily.        . Multiple Vitamins-Minerals (ICAPS PO) Take 1 capsule by mouth daily.        . pravastatin (PRAVACHOL) 20 MG tablet Take 20 mg by mouth daily.          Past Medical History  Diagnosis Date  . Hypertension   . Hyperlipidemia     total cholesterol of 271 and LDL of 187 in 07/2010  . CAD (coronary artery  disease)     non-obstructive; negative stress nuclear and normal echo in 08/2009 [facility] in 03/2000-20%  main 20% LAD, 30% circumflex, 30% RCA, hyperdynamic LV  . Macular degeneration   . Diabetes mellitus     A1c of 7.6 in 04/2010; moderate to high dose insulin  . Tobacco abuse   . Asthma     Mild  . Palpitations   . Chest pain     Past Surgical History  Procedure Date  . Cataract extraction     left  . Appendectomy   . Tubal ligation   . Colonoscopy     within the past 3 years    No family history on file.  History   Social History  . Marital Status: Married    Spouse Name: N/A    Number of Children: 1  . Years of Education: N/A   Occupational History  . retired     Interior and spatial designer   Social History Main Topics  . Smoking status: Current Everyday Smoker -- 0.5 packs/day    Types: Cigarettes  . Smokeless tobacco: Never Used  . Alcohol Use: No  .  Drug Use: No  . Sexually Active: Not on file   Other Topics Concern  . Not on file   Social History Narrative  . No narrative on file    ROS: See HPI Eyes: Negative Ears:Negative for hearing loss, tinnitus Cardiovascular: Negative for chest pain, palpitations,irregular heartbeat, dyspnea, dyspnea on exertion, near-syncope, orthopnea, paroxysmal nocturnal dyspnia and syncope,edema, claudication, cyanosis,.  Respiratory:   Negative for cough, hemoptysis, shortness of breath, sleep disturbances due to breathing, sputum production and wheezing.   Endocrine: Negative for cold intolerance and heat intolerance.  Hematologic/Lymphatic: Negative for adenopathy and bleeding problem. Does not bruise/bleed easily.  Musculoskeletal: Negative.   Gastrointestinal: Negative for nausea, vomiting, reflux, abdominal pain, diarrhea, constipation.   Genitourinary: Negative for bladder incontinence, dysuria, flank pain, frequency, hematuria, hesitancy, nocturia and urgency.  Neurological: Negative.    Allergic/Immunologic: Negative for environmental allergies.   PHYSICAL EXAM  BP 138/63  Pulse 74  Ht 5\' 4"  (1.626 m)  Wt 153 lb (69.4 kg)  BMI 26.26 kg/m2  SpO2 98% Well-nournished, in no acute distress. Neck: No JVD, HJR, Bruit, or thyroid enlargement Lungs: No tachypnea, clear without wheezing, rales, or rhonchi Cardiovascular: RRR, PMI not displaced, heart sounds normal, no murmurs, gallops, bruit, thrill, or heave. Abdomen: BS normal. Soft without organomegaly, masses, lesions or tenderness. Extremities: without cyanosis, clubbing or edema. Good distal pulses bilateral SKin: Warm, no lesions or rashes  Musculoskeletal: No deformities Neuro: no focal signs   ASSESSMENT AND PLAN:

## 2010-12-11 ENCOUNTER — Encounter: Payer: Self-pay | Admitting: Cardiology

## 2011-06-02 ENCOUNTER — Ambulatory Visit (INDEPENDENT_AMBULATORY_CARE_PROVIDER_SITE_OTHER): Payer: Medicare Other | Admitting: Cardiology

## 2011-06-02 ENCOUNTER — Encounter: Payer: Self-pay | Admitting: Cardiology

## 2011-06-02 DIAGNOSIS — R002 Palpitations: Secondary | ICD-10-CM

## 2011-06-02 DIAGNOSIS — R079 Chest pain, unspecified: Secondary | ICD-10-CM

## 2011-06-02 DIAGNOSIS — I10 Essential (primary) hypertension: Secondary | ICD-10-CM

## 2011-06-02 DIAGNOSIS — E785 Hyperlipidemia, unspecified: Secondary | ICD-10-CM

## 2011-06-02 DIAGNOSIS — I2510 Atherosclerotic heart disease of native coronary artery without angina pectoris: Secondary | ICD-10-CM

## 2011-06-02 DIAGNOSIS — E119 Type 2 diabetes mellitus without complications: Secondary | ICD-10-CM

## 2011-06-02 DIAGNOSIS — F17200 Nicotine dependence, unspecified, uncomplicated: Secondary | ICD-10-CM

## 2011-06-02 DIAGNOSIS — Z720 Tobacco use: Secondary | ICD-10-CM

## 2011-06-02 MED ORDER — LORAZEPAM 0.5 MG PO TABS: 0.5000 mg | ORAL_TABLET | Freq: Three times a day (TID) | ORAL | Status: AC | PRN

## 2011-06-02 MED ORDER — NITROGLYCERIN 0.4 MG SL SUBL: 0.4000 mg | SUBLINGUAL_TABLET | SUBLINGUAL | Status: DC | PRN

## 2011-06-02 MED ORDER — CHOLESTYRAMINE POWD: 1.0000 | Freq: Three times a day (TID) | Status: DC

## 2011-06-02 NOTE — Progress Notes (Signed)
Patient ID: FRANZISKA PODGURSKI, female   DOB: 1940-09-13, 71 y.o.   MRN: [id number] HPI: Scheduled return visit for this nice woman with a history of palpitations, multiple cardiovascular risk factors and nonobstructive coronary disease.  Since her last visit, she has done fine.  She has occasional palpitations lasting matter of seconds.  She notes no chest discomfort nor dyspnea and denies claudication.  She's had no neurologic symptoms.  Prior to Admission medications   Medication Sig Start Date End Date Taking? Authorizing Provider  Ascorbic Acid (VITAMIN C) 1000 MG tablet Take 1,000 mg by mouth daily.     Yes Historical Provider, MD  aspirin 325 MG tablet Take 325 mg by mouth daily.     Yes Historical Provider, MD  Biotin 5000 MCG CAPS Take 1 capsule by mouth daily.     Yes Historical Provider, MD  Calcium Carbonate (CALTRATE 600 PO) Take 1 tablet by mouth daily.     Yes Historical Provider, MD  Cholecalciferol (VITAMIN D) 1000 UNITS capsule Take 1,000 Units by mouth daily.     Yes Historical Provider, MD  Coenzyme Q10 (CO Q 10) 100 MG CAPS Take 1 tablet by mouth daily.     Yes Historical Provider, MD  ezetimibe (ZETIA) 10 MG tablet Take 1 tablet (10 mg total) by mouth daily. 08/26/10  Yes Gerrit Friends. Akshay Spang, MD  insulin NPH (HUMULIN N) 100 UNIT/ML injection Inject 38 Units into the skin daily.     Yes Historical Provider, MD  insulin NPH-insulin regular (NOVOLIN 70/30) (70-30) 100 UNIT/ML injection Inject 4 Units into the skin 2 (two) times daily.     Yes Historical Provider, MD  lisinopril (PRINIVIL,ZESTRIL) 40 MG tablet Take 40 mg by mouth daily.     Yes Historical Provider, MD  metoprolol succinate (TOPROL-XL) 25 MG 24 hr tablet Take 1 tablet (25 mg total) by mouth daily. 12/03/10  Yes Gerrit Friends. Halvor Behrend, MD  Multiple Vitamins-Minerals (ICAPS PO) Take 1 capsule by mouth daily.     Yes Historical Provider, MD  pravastatin (PRAVACHOL) 20 MG tablet Take 20 mg by mouth daily.     Yes Historical Provider,  MD  vitamin E 1000 UNIT capsule Take 1,000 Units by mouth daily.     Yes Historical Provider, MD    Allergies  Allergen Reactions  . Penicillins   . Sulfonamide Derivatives       Past medical history, social history, and family history reviewed and updated.  ROS: See history of present illness.  PHYSICAL EXAM: BP 112/64  Pulse 69  Ht 5\' 4"  (1.626 m)  Wt 69.001 kg (152 lb 1.9 oz)  BMI 26.11 kg/m2  General-Well developed; no acute distress Body habitus-proportionate weight and height Neck-No JVD; no carotid bruits Lungs-clear lung fields; resonant to percussion Cardiovascular-normal PMI; normal S1 and S2 Abdomen-normal bowel sounds; soft and non-tender without masses or organomegaly Musculoskeletal-No deformities, no cyanosis or clubbing Neurologic-Normal cranial nerves; symmetric strength and tone Skin-Warm, no significant lesions Extremities-1-2+ distal pulses; no edema  ASSESSMENT AND PLAN:  Industry Bing, MD 06/02/2011 1:20 PM

## 2011-06-02 NOTE — Assessment & Plan Note (Addendum)
Patient reports that cigarette consumption is down to 1/4 pack per day.  She is encouraged to quit entirely.

## 2011-06-02 NOTE — Assessment & Plan Note (Signed)
No recent chest discomfort.  Functional testing is not required.

## 2011-06-02 NOTE — Assessment & Plan Note (Addendum)
Patient has been able to tolerate only low doses of nonpalpable statins and thus has not had adequate control of hyperlipidemia.  Nonpharmacologic approaches including plant sterols, oat brain and red yeast rice were suggested to her.  Cholestyramine will also be started at a dose of 6 g 3 times a day with meals and lipids recheck in 1-2 months.

## 2011-06-02 NOTE — Assessment & Plan Note (Signed)
Blood pressure control is excellent; current medications will be continued. 

## 2011-06-02 NOTE — Assessment & Plan Note (Signed)
No current symptoms to suggest the presence of myocardial ischemia.  We will continue to optimally control cardiovascular risk factors.

## 2011-06-02 NOTE — Assessment & Plan Note (Signed)
She does not appear to have any significant arrhythmias.  Low-dose acebutolol has been well tolerated and is providing adequate treatment for this problem.

## 2011-06-02 NOTE — Assessment & Plan Note (Signed)
Diabetic control is good.  No renal involvement apparent based upon urine albumin excretion.

## 2011-06-02 NOTE — Patient Instructions (Signed)
Your physician recommends that you schedule a follow-up appointment in: 2 months  Your physician has recommended you make the following change in your medication:  1 - Ativan 0.5 mg and take 1/2 tablet 3 times a day as needed.  May increase to 1 tablet 3 times a day if needed. 2 - Cholestyramine powder 1 scoop 3 times a day in water with meals  Increase Oat Bran Substitute benechol for butter

## 2011-08-13 ENCOUNTER — Ambulatory Visit: Payer: Medicare Other | Admitting: Cardiology

## 2011-08-23 ENCOUNTER — Other Ambulatory Visit (HOSPITAL_COMMUNITY): Payer: Self-pay | Admitting: Internal Medicine

## 2011-08-23 DIAGNOSIS — R1011 Right upper quadrant pain: Secondary | ICD-10-CM

## 2011-08-24 ENCOUNTER — Ambulatory Visit (HOSPITAL_COMMUNITY)
Admission: RE | Admit: 2011-08-24 | Discharge: 2011-08-24 | Disposition: A | Discharge status: Home / Self Care | Payer: Medicare Other | Source: Ambulatory Visit | Attending: Internal Medicine | Admitting: Internal Medicine

## 2011-08-24 ENCOUNTER — Encounter: Payer: Self-pay | Admitting: Cardiology

## 2011-08-24 DIAGNOSIS — R110 Nausea: Secondary | ICD-10-CM | POA: Insufficient documentation

## 2011-08-24 DIAGNOSIS — R1011 Right upper quadrant pain: Secondary | ICD-10-CM

## 2011-08-31 ENCOUNTER — Ambulatory Visit: Payer: Medicare Other | Admitting: Cardiology

## 2011-09-01 ENCOUNTER — Ambulatory Visit (INDEPENDENT_AMBULATORY_CARE_PROVIDER_SITE_OTHER): Payer: Medicare Other | Admitting: Internal Medicine

## 2011-09-01 ENCOUNTER — Encounter (INDEPENDENT_AMBULATORY_CARE_PROVIDER_SITE_OTHER): Payer: Self-pay | Admitting: Internal Medicine

## 2011-09-01 VITALS — BP 118/60 | HR 72 | Temp 98.4°F | Ht 64.0 in | Wt 152.6 lb

## 2011-09-01 DIAGNOSIS — R109 Unspecified abdominal pain: Secondary | ICD-10-CM

## 2011-09-01 DIAGNOSIS — R1032 Left lower quadrant pain: Secondary | ICD-10-CM | POA: Insufficient documentation

## 2011-09-01 NOTE — Patient Instructions (Addendum)
HIda scan and CmetAbdominal Pain Abdominal pain can be caused by many things. Your caregiver decides the seriousness of your pain by an examination and possibly blood tests and X-rays. Many cases can be observed and treated at home. Most abdominal pain is not caused by a disease and will probably improve without treatment. However, in many cases, more time must pass before a clear cause of the pain can be found. Before that point, it may not be known if you need more testing, or if hospitalization or surgery is needed. HOME CARE INSTRUCTIONS    Do not take laxatives unless directed by your caregiver.   Take pain medicine only as directed by your caregiver.   Only take over-the-counter or prescription medicines for pain, discomfort, or fever as directed by your caregiver.   Try a clear liquid diet (broth, tea, or water) for as long as directed by your caregiver. Slowly move to a bland diet as tolerated.  SEEK IMMEDIATE MEDICAL CARE IF:    The pain does not go away.   You have a fever.   You keep throwing up (vomiting).   The pain is felt only in portions of the abdomen. Pain in the right side could possibly be appendicitis. In an adult, pain in the left lower portion of the abdomen could be colitis or diverticulitis.   You pass bloody or black tarry stools.  MAKE SURE YOU:    Understand these instructions.   Will watch your condition.   Will get help right away if you are not doing well or get worse.  Document Released: 02/17/2005 Document Revised: 04/29/2011 Document Reviewed: 12/27/2007 [facility] Patient Information 2012 [place], [place].

## 2011-09-01 NOTE — Progress Notes (Signed)
Subjective:     Patient ID: Barbara [name], female   DOB: 10-07-1940, 71 y.o.   MRN: [id number]  HPIPresents today with c/o severe rt upper quadrant pain x 1 yr. The pain comes and goes. The pain will occur 3-4 times a week.  The pain is not related to food. She can just be sitting or turn a certain way and she will have the pain.  The pain will also radiate around to her left side.  Appetite is good. No weight loss. Sometimes she will swell in her epigastric region and will become hard.  She will have a BM about every day.  No melena or bright red rectal bleeding.     Abdominal US 08/23/2011 normal. CBD 4.1 EGD11/2004: Hx of large gastric ulcer at antrum. The gastric ulcer has completely healed, but she still has a few antral erosions. 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 No high grade dysplasia or invasive malignancy identified.  HIDA scan 04/04/2007: Patent biliary tree. Abnormal GB response to fatty meal stimulation with decreased gall bladder ejection fraction of 26%.  Review of Systems Current Outpatient Prescriptions  Medication Sig Dispense Refill  . Ascorbic Acid (VITAMIN C) 1000 MG tablet Take 1,000 mg by mouth daily.        Marland Kitchen aspirin 325 MG tablet Take 325 mg by mouth daily.        . Biotin 5000 MCG CAPS Take 1 capsule by mouth daily.        . Calcium Carbonate (CALTRATE 600 PO) Take 1 tablet by mouth daily.        . Cholecalciferol (VITAMIN D) 1000 UNITS capsule Take 1,000 Units by mouth daily.        . Coenzyme Q10 (CO Q 10) 100 MG CAPS Take 1 tablet by mouth daily.        Marland Kitchen ezetimibe (ZETIA) 10 MG tablet Take 1 tablet (10 mg total) by mouth daily.  90 tablet  3  . insulin NPH (HUMULIN N) 100 UNIT/ML injection Inject 38 Units into the skin daily.        . insulin NPH-insulin regular (NOVOLIN 70/30) (70-30) 100 UNIT/ML injection Inject 4 Units into the skin 2 (two) times daily.        Marland Kitchen lisinopril (PRINIVIL,ZESTRIL) 40 MG tablet Take 40 mg by mouth daily.        .  metoprolol succinate (TOPROL-XL) 25 MG 24 hr tablet Take 1 tablet (25 mg total) by mouth daily.  90 tablet  3  . Multiple Vitamins-Minerals (ICAPS PO) Take 1 capsule by mouth daily.        . nitroGLYCERIN (NITROSTAT) 0.4 MG SL tablet Place 1 tablet (0.4 mg total) under the tongue every 5 (five) minutes as needed for chest pain.  100 tablet  3  . pravastatin (PRAVACHOL) 20 MG tablet Take 20 mg by mouth daily.        . vitamin E 1000 UNIT capsule Take 1,000 Units by mouth daily.         Past Medical History  Diagnosis Date  . Hypertension   . Hyperlipidemia     total cholesterol of 271 and LDL of 187 in 07/2010  . 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 Macular degeneration   . Diabetes mellitus     A1c of 7.6 in 04/2010; moderate  to high dose insulin  . Tobacco abuse   . Asthma     Mild  . Palpitations   . Chest pain    History   Social History  . Marital Status: Married    Spouse Name: N/A    Number of Children: 1  . Years of Education: N/A   Occupational History  . retired     Interior and spatial designer   Social History Main Topics  . Smoking status: Current Everyday Smoker -- 0.5 packs/day    Types: Cigarettes  . Smokeless tobacco: Never Used   Comment: QUIT SMOKING 6 months ago after smoking20 yrs  . Alcohol Use: No  . Drug Use: No  . Sexually Active: Not on file   Other Topics Concern  . Not on file   Social History Narrative  . No narrative on file   Past Surgical History  Procedure Date  . Cataract extraction     left  . Appendectomy   . Tubal ligation   . Colonoscopy 2009   History   Social History  . Marital Status: Married    Spouse Name: N/A    Number of Children: 1  . 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 Smoking status: Current Everyday Smoker -- 0.5 packs/day    Types: Cigarettes  . Smokeless tobacco: Never Used   Comment: QUIT SMOKING 6 months ago after smoking20 yrs  . Alcohol Use: No  . Drug Use: No  . Sexually Active: Not on file   Other Topics Concern  . Not on file   Social History Narrative  . No narrative on file   Family Status  Relation Status Death Age  . Mother Deceased 51    myocardial infarction  . Father Deceased 72    throat cancer  . Sister Deceased 47's    breast cnacer  . Brother Deceased 83    MI  . Brother Deceased 55    MI  . Sister Deceased 68's    breast cancer  . Sister Deceased 80's    renal failure   Allergies  Allergen Reactions  . Penicillins   . Sulfonamide Derivatives        Objective:   Physical Exam Filed Vitals:   09/01/11 1545  Height: 5\' 4"  (1.626 m)  Weight: 152 lb 9.6 oz (69.219 kg)   Alert and oriented. Skin warm and dry. Oral mucosa is moist.   . Sclera anicteric, conjunctivae is pink. Thyroid not enlarged. No cervical lymphadenopathy. Lungs clear. Heart regular rate and rhythm.  Abdomen is soft. Bowel sounds are positive. No hepatomegaly. No abdominal masses felt. No tenderness.  No edema to lower extremities. Patient is alert and oriented.      Assessment:   Rt upper quadrant pain.  Abnormal HIDA scan in 2008    Plan:    CMET, HIDA can, Further recommendations once we have results back.

## 2011-09-02 LAB — COMPREHENSIVE METABOLIC PANEL
ALT: 12 U/L (ref 0–35)
Albumin: 4.2 g/dL (ref 3.5–5.2)
CO2: 25 mEq/L (ref 19–32)
Chloride: 106 mEq/L (ref 96–112)
Glucose, Bld: 59 mg/dL — ABNORMAL LOW (ref 70–99)
Potassium: 4.6 mEq/L (ref 3.5–5.3)
Sodium: 138 mEq/L (ref 135–145)
Total Protein: 6.4 g/dL (ref 6.0–8.3)

## 2011-09-07 ENCOUNTER — Encounter (HOSPITAL_COMMUNITY)
Admission: RE | Admit: 2011-09-07 | Discharge: 2011-09-07 | Disposition: A | Discharge status: Home / Self Care | Payer: Medicare Other | Source: Ambulatory Visit | Attending: Internal Medicine | Admitting: Internal Medicine

## 2011-09-07 ENCOUNTER — Encounter (HOSPITAL_COMMUNITY): Payer: Self-pay

## 2011-09-07 DIAGNOSIS — R1011 Right upper quadrant pain: Secondary | ICD-10-CM | POA: Insufficient documentation

## 2011-09-07 DIAGNOSIS — R109 Unspecified abdominal pain: Secondary | ICD-10-CM

## 2011-09-07 MED ORDER — SINCALIDE 5 MCG IJ SOLR
1.4800 ug | Freq: Once | INTRAMUSCULAR | Status: AC
  Administered 2011-09-07: 1.48 ug via INTRAVENOUS

## 2011-09-07 MED ORDER — TECHNETIUM TC 99M MEBROFENIN IV KIT
5.0000 | PACK | Freq: Once | INTRAVENOUS | Status: AC | PRN
  Administered 2011-09-07: 4.9 via INTRAVENOUS

## 2011-09-08 ENCOUNTER — Telehealth (INDEPENDENT_AMBULATORY_CARE_PROVIDER_SITE_OTHER): Payer: Self-pay | Admitting: Internal Medicine

## 2011-09-08 ENCOUNTER — Other Ambulatory Visit (INDEPENDENT_AMBULATORY_CARE_PROVIDER_SITE_OTHER): Payer: Self-pay | Admitting: *Deleted

## 2011-09-08 DIAGNOSIS — R1114 Bilious vomiting: Secondary | ICD-10-CM

## 2011-09-08 NOTE — Telephone Encounter (Signed)
Vomiting yellow bile. She is better today. She is requesting an EGD. No acid reflux. She has a hx of ulcer.  No burning.  Frequent burping.Dewayne Hatch, Please schedule and EGD with Dr. Karilyn Cota.

## 2011-09-08 NOTE — Telephone Encounter (Signed)
EGD sch'd 10/21/11--I offered patient appt 09/10/11 for EGD but she had something to do, I explained my next available was end of May and she was ok with waiting until end of May

## 2011-10-01 ENCOUNTER — Ambulatory Visit: Payer: Medicare Other | Admitting: Cardiology

## 2011-10-20 ENCOUNTER — Encounter (HOSPITAL_COMMUNITY): Payer: Self-pay | Admitting: Pharmacy Technician

## 2011-10-20 MED ORDER — SODIUM CHLORIDE 0.45 % IV SOLN
Freq: Once | INTRAVENOUS | Status: AC
  Administered 2011-10-21: 1000 mL via INTRAVENOUS

## 2011-10-21 ENCOUNTER — Encounter (HOSPITAL_COMMUNITY)
Admission: RE | Disposition: A | Discharge status: Home / Self Care | Payer: Self-pay | Source: Ambulatory Visit | Attending: Internal Medicine

## 2011-10-21 ENCOUNTER — Ambulatory Visit (HOSPITAL_COMMUNITY)
Admission: RE | Admit: 2011-10-21 | Discharge: 2011-10-21 | Disposition: A | Discharge status: Home / Self Care | Payer: Medicare Other | Source: Ambulatory Visit | Attending: Internal Medicine | Admitting: Internal Medicine

## 2011-10-21 ENCOUNTER — Encounter (HOSPITAL_COMMUNITY): Payer: Self-pay | Admitting: *Deleted

## 2011-10-21 DIAGNOSIS — R1011 Right upper quadrant pain: Secondary | ICD-10-CM

## 2011-10-21 DIAGNOSIS — R112 Nausea with vomiting, unspecified: Secondary | ICD-10-CM

## 2011-10-21 DIAGNOSIS — E785 Hyperlipidemia, unspecified: Secondary | ICD-10-CM | POA: Insufficient documentation

## 2011-10-21 DIAGNOSIS — K219 Gastro-esophageal reflux disease without esophagitis: Secondary | ICD-10-CM | POA: Insufficient documentation

## 2011-10-21 DIAGNOSIS — K2980 Duodenitis without bleeding: Secondary | ICD-10-CM | POA: Insufficient documentation

## 2011-10-21 DIAGNOSIS — Z79899 Other long term (current) drug therapy: Secondary | ICD-10-CM | POA: Insufficient documentation

## 2011-10-21 DIAGNOSIS — R1114 Bilious vomiting: Secondary | ICD-10-CM

## 2011-10-21 DIAGNOSIS — K2990 Gastroduodenitis, unspecified, without bleeding: Secondary | ICD-10-CM

## 2011-10-21 DIAGNOSIS — I10 Essential (primary) hypertension: Secondary | ICD-10-CM | POA: Insufficient documentation

## 2011-10-21 DIAGNOSIS — R1013 Epigastric pain: Secondary | ICD-10-CM | POA: Insufficient documentation

## 2011-10-21 DIAGNOSIS — K208 Other esophagitis without bleeding: Secondary | ICD-10-CM

## 2011-10-21 DIAGNOSIS — Z7982 Long term (current) use of aspirin: Secondary | ICD-10-CM | POA: Insufficient documentation

## 2011-10-21 DIAGNOSIS — K2970 Gastritis, unspecified, without bleeding: Secondary | ICD-10-CM

## 2011-10-21 DIAGNOSIS — E119 Type 2 diabetes mellitus without complications: Secondary | ICD-10-CM | POA: Insufficient documentation

## 2011-10-21 DIAGNOSIS — K259 Gastric ulcer, unspecified as acute or chronic, without hemorrhage or perforation: Secondary | ICD-10-CM | POA: Insufficient documentation

## 2011-10-21 DIAGNOSIS — Z794 Long term (current) use of insulin: Secondary | ICD-10-CM | POA: Insufficient documentation

## 2011-10-21 HISTORY — PX: ESOPHAGOGASTRODUODENOSCOPY: SHX5428

## 2011-10-21 HISTORY — DX: Gastro-esophageal reflux disease without esophagitis: K21.9

## 2011-10-21 LAB — GLUCOSE, CAPILLARY

## 2011-10-21 SURGERY — EGD (ESOPHAGOGASTRODUODENOSCOPY)
Anesthesia: Moderate Sedation

## 2011-10-21 MED ORDER — MIDAZOLAM HCL 5 MG/5ML IJ SOLN
INTRAMUSCULAR | Status: DC | PRN
  Administered 2011-10-21 (×2): 2 mg via INTRAVENOUS

## 2011-10-21 MED ORDER — MEPERIDINE HCL 25 MG/ML IJ SOLN
INTRAMUSCULAR | Status: DC | PRN
  Administered 2011-10-21 (×2): 25 mg via INTRAVENOUS

## 2011-10-21 MED ORDER — MIDAZOLAM HCL 5 MG/5ML IJ SOLN
INTRAMUSCULAR | Status: AC
  Filled 2011-10-21: qty 10

## 2011-10-21 MED ORDER — PANTOPRAZOLE SODIUM 40 MG PO TBEC: 40.0000 mg | DELAYED_RELEASE_TABLET | Freq: Two times a day (BID) | ORAL | Status: DC

## 2011-10-21 MED ORDER — BUTAMBEN-TETRACAINE-BENZOCAINE 2-2-14 % EX AERO
INHALATION_SPRAY | CUTANEOUS | Status: DC | PRN
  Administered 2011-10-21: 2 via TOPICAL

## 2011-10-21 MED ORDER — STERILE WATER FOR IRRIGATION IR SOLN
Status: DC | PRN
  Administered 2011-10-21: 12:00:00

## 2011-10-21 MED ORDER — MEPERIDINE HCL 50 MG/ML IJ SOLN
INTRAMUSCULAR | Status: AC
  Filled 2011-10-21: qty 1

## 2011-10-21 MED ORDER — PANTOPRAZOLE SODIUM 40 MG PO TBEC: 40.0000 mg | DELAYED_RELEASE_TABLET | Freq: Every day | ORAL | Status: DC

## 2011-10-21 NOTE — Discharge Instructions (Signed)
Discontinue Prilosec or omeprazole. Start pantoprazole 40 mg by mouth twice a day ; 30 minutes before breakfast and evening meal. No driving for 24 hours  Physician will contact you with the results of blood work . Office visit in 3 weeks       Endoscopy Care After Please read the instructions outlined below and refer to this sheet in the next few weeks. These discharge instructions provide you with general information on caring for yourself after you leave the hospital. Your doctor may also give you specific instructions. While your treatment has been planned according to the most current medical practices available, unavoidable complications occasionally occur. If you have any problems or questions after discharge, please call your doctor. HOME CARE INSTRUCTIONS Activity  You may resume your regular activity but move at a slower pace for the next 24 hours.   Take frequent rest periods for the next 24 hours.   Walking will help expel (get rid of) the air and reduce the bloated feeling in your abdomen.   No driving for 24 hours (because of the anesthesia (medicine) used during the test).   You may shower.   Do not sign any important legal documents or operate any machinery for 24 hours (because of the anesthesia used during the test).  Nutrition  Drink plenty of fluids.   You may resume your normal diet.   Begin with a light meal and progress to your normal diet.   Avoid alcoholic beverages for 24 hours or as instructed by your caregiver.  Medications You may resume your normal medications unless your caregiver tells you otherwise. What you can expect today  You may experience abdominal discomfort such as a feeling of fullness or "gas" pains.   You may experience a sore throat for 2 to 3 days. This is normal. Gargling with salt water may help this.  Follow-up Your doctor will discuss the results of your test with you. SEEK IMMEDIATE MEDICAL CARE IF:  You have excessive  nausea (feeling sick to your stomach) and/or vomiting.   You have severe abdominal pain and distention (swelling).   You have trouble swallowing.   You have a temperature over 100 F (37.8 C).   You have rectal bleeding or vomiting of blood.  Document Released: 12/23/2003 Document Revised: 04/29/2011 Document Reviewed: 07/05/2007 [facility] Patient Information 2012 [place], [place].

## 2011-10-21 NOTE — Op Note (Signed)
EGD PROCEDURE REPORT  PATIENT:  Barbara [name]  MR#:  [id number] Birthdate:  [birth year], 71 y.o., female Endoscopist:  Dr. Malissa Hippo, MD Referred By:  Dr. Carylon Perches, MD Procedure Date: 10/21/2011  Procedure:   EGD  Indications:  Patient is 71 year old occasion female with history of gastric ulcer who presents with worsening right-sided low quadrant and epigastric pain associated with nausea and vomiting. Ultrasound was negative for cholelithiasis and HIDA scan revealed EF was 76%. She is on full dose aspirin but does not take other NSAIDs.            Informed Consent:  The risks, benefits, alternatives & imponderables which include, but are not limited to, bleeding, infection, perforation, drug reaction and potential missed lesion have been reviewed.  The potential for biopsy, lesion removal, esophageal dilation, etc. have also been discussed.  Questions have been answered.  All parties agreeable.  Please see history & physical in medical record for more information.  Medications:  Demerol 50 mg IV Versed 4 mg IV Cetacaine spray topically for oropharyngeal anesthesia  Description of procedure:  The endoscope was introduced through the mouth and advanced to the second portion of the duodenum without difficulty or limitations. The mucosal surfaces were surveyed very carefully during advancement of the scope and upon withdrawal.  Findings:  Esophagus:  Mucosa of the esophagus was normal. Edema and edema noted at GE junction on the gastric side. GEJ:  39 cm Stomach:  Stomach was empty and distended very well with insufflation. Folds in the proximal stomach are normal. Examination mucosa revealed multiple antral erosions along with stellate scar on a raised mucosa assistant with healing ulcer. Angularis fundus and cardia were examined by retroflexing the scope and were normal. Patent pylorus with edema erythema and mucosal friability. Duodenum:  Patchy bulbar erythema edema and erosions.  Motor changes noted just past the angle of the duodenum. 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 Multiple antral erosions with healing ulcer. Pyloric channel patent but mucosa revealed inflammatory changes. Erosive bulbar duodenitis.  Recommendations:  Discontinue omeprazole. H. pylori serology. Pantoprazole 40 mg by mouth twice a day Office visit in 3 weeks  Hollie Wojahn U  10/21/2011  11:56 AM  CC: Dr. Carylon Perches, MD, MD & Dr. Bonnetta Barry ref. provider found

## 2011-10-21 NOTE — H&P (Signed)
Barbara [name] is an 71 y.o. female.   Chief Complaint: Patient is here for diagnostic EGD. HPI: Patient is 71 year old Caucasian female with several month history of intermittent right upper quadrant epigastric pain now coming more frequent troubles associated with nausea and vomiting. She generally vomits bile. At times she wakes up with this pain in the middle of night. Ultrasound was negative for cholelithiasis. She had hepatobiliary scan he have a 76%. She did report pain during the procedure. She denies melena weight loss.. her appetite is fair. She takes full dose aspirin for antiplatelet function. This morning she had an episode of hematochezia felt to be secondary to hemorrhoids. She has history of colonic polyps and have undergone regular colonoscopies.  Past Medical History  Diagnosis Date  . Hypertension   . Hyperlipidemia     total cholesterol of 271 and LDL of 187 in 07/2010  . 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 Macular degeneration   . Diabetes mellitus     A1c of 7.6 in 04/2010; moderate to high dose insulin  . Tobacco abuse   . Asthma     Mild  . Palpitations   . Chest pain   . GERD (gastroesophageal reflux disease)     Past Surgical History  Procedure Date  . Cataract extraction     left  . Appendectomy   . Tubal ligation   . Colonoscopy 2009    History reviewed. No pertinent family history. Social History:  reports that she has quit smoking. Her smoking use included Cigarettes. She has a 7.5 pack-year smoking history. She has never used smokeless tobacco. She reports that she does not drink alcohol or use illicit drugs.  Allergies:  Allergies  Allergen Reactions  . Bee Venom Anaphylaxis  . Sulfonamide Derivatives Nausea And Vomiting  . Penicillins Itching and Rash     Medications Prior to Admission  Medication Sig Dispense Refill  . albuterol (PROVENTIL HFA;VENTOLIN HFA) 108 (90 BASE) MCG/ACT inhaler Inhale 2 puffs into the lungs every 4 (four) hours as needed. FOR SHORTNESS OF BREATH      . aspirin 325 MG tablet Take 325 mg by mouth at bedtime.       . Calcium Carbonate Antacid (TUMS E-X SUGAR FREE PO) Take 2 tablets by mouth every morning.      . Cholecalciferol (VITAMIN D) 1000 UNITS capsule Take 1,000 Units by mouth at bedtime.       Marland Kitchen HYDROcodone-acetaminophen (VICODIN) 5-500 MG per tablet Take 1 tablet by mouth every 4 (four) hours as needed. FOR PAIN      . insulin NPH (HUMULIN N) 100 UNIT/ML injection Inject 38 Units into the skin every morning.       Marland Kitchen ketotifen (ZADITOR) 0.025 % ophthalmic solution Place 1 drop into both eyes 2 (two) times daily.      Marland Kitchen lisinopril (PRINIVIL,ZESTRIL) 40 MG tablet Take 40 mg by mouth at bedtime.       . metoprolol succinate (TOPROL-XL) 25 MG 24 hr tablet Take 1 tablet (25 mg total) by mouth daily.  90 tablet  3  . Bevacizumab (AVASTIN) 100 MG/4ML SOLN Inject 1.25 mg into the vein every 8 (eight) weeks.      . insulin regular (NOVOLIN R,HUMULIN R) 100 units/mL injection Inject 2-10 Units into the skin 3 (three) times daily before  meals. SLIDING SCALE      . Multiple Vitamins-Minerals (ICAPS PO) Take 1 capsule by mouth 2 (two) times daily.       . nitroGLYCERIN (NITROSTAT) 0.4 MG SL tablet Place 0.4 mg under the tongue every 5 (five) minutes x 3 doses as needed.      Marland Kitchen omeprazole (PRILOSEC) 20 MG capsule Take 20 mg by mouth daily as needed. FOR ACID REFLUX      . vitamin A 8000 UNIT capsule Take 8,000 Units by mouth at bedtime.      . vitamin C (ASCORBIC ACID) 500 MG tablet Take 500 mg by mouth at bedtime.      . vitamin E 1000 UNIT capsule Take 1,000 Units by mouth at bedtime.       Marland Kitchen zinc gluconate 50 MG tablet Take 50 mg by mouth at bedtime.        Results for orders placed during the hospital encounter of  10/21/11 (from the past 48 hour(s))  GLUCOSE, CAPILLARY     Status: Abnormal   Collection Time   10/21/11 10:56 AM      Component Value Range Comment   Glucose-Capillary 181 (*) 70 - 99 (mg/dL)    No results found.  ROS  Blood pressure 136/63, pulse 65, temperature 98.2 F (36.8 C), temperature source Oral, resp. rate 27, SpO2 97.00%. Physical Exam  Constitutional: She appears well-developed and well-nourished.  HENT:  Mouth/Throat: Oropharynx is clear and moist.  Eyes: Conjunctivae are normal. No scleral icterus.  Neck: No thyromegaly present.  Cardiovascular: Normal rate, regular rhythm and normal heart sounds.   No murmur heard. Respiratory: Effort normal and breath sounds normal.  GI: Soft. Bowel sounds are normal. She exhibits no distension and no mass. There is Tenderness: mild tenderness at the epigastrium and right upper quadrant..  Musculoskeletal: She exhibits no edema.  Lymphadenopathy:    She has no cervical adenopathy.  Neurological: She is alert.  Skin: Skin is warm and dry.     Assessment/Plan Epigastric/right upper quadrant abdominal pain associated with nausea and vomiting. Diagnostic EGD.  [name] U 10/21/2011, 11:28 AM

## 2011-10-22 ENCOUNTER — Telehealth (INDEPENDENT_AMBULATORY_CARE_PROVIDER_SITE_OTHER): Payer: Self-pay | Admitting: *Deleted

## 2011-10-22 NOTE — Telephone Encounter (Signed)
Per Dr Patty Sermons op note, patient needs ov in 3 weeks

## 2011-10-26 ENCOUNTER — Encounter (HOSPITAL_COMMUNITY): Payer: Self-pay | Admitting: Internal Medicine

## 2011-11-16 ENCOUNTER — Encounter (INDEPENDENT_AMBULATORY_CARE_PROVIDER_SITE_OTHER): Payer: Self-pay | Admitting: Internal Medicine

## 2011-11-16 ENCOUNTER — Ambulatory Visit (INDEPENDENT_AMBULATORY_CARE_PROVIDER_SITE_OTHER): Payer: Medicare Other | Admitting: Internal Medicine

## 2011-11-16 VITALS — BP 116/72 | HR 76 | Temp 97.8°F | Resp 20 | Ht 64.0 in | Wt 152.2 lb

## 2011-11-16 DIAGNOSIS — R1011 Right upper quadrant pain: Secondary | ICD-10-CM | POA: Insufficient documentation

## 2011-11-16 DIAGNOSIS — R1031 Right lower quadrant pain: Secondary | ICD-10-CM

## 2011-11-16 DIAGNOSIS — K279 Peptic ulcer, site unspecified, unspecified as acute or chronic, without hemorrhage or perforation: Secondary | ICD-10-CM

## 2011-11-16 DIAGNOSIS — R1032 Left lower quadrant pain: Secondary | ICD-10-CM

## 2011-11-16 MED ORDER — DICYCLOMINE HCL 10 MG PO CAPS: 10.0000 mg | ORAL_CAPSULE | Freq: Two times a day (BID) | ORAL | Status: DC

## 2011-11-16 NOTE — Progress Notes (Signed)
Presenting complaint;  Followup for right upper quadrant and lower abdominal pain. Subjective:  Barbara [name] 71 year old Caucasian female patient of Dr. Ilean Skill was there for scheduled visit. She does not feel well. She is quite frustrated because we have not been able to ameliorate her symptoms. She had EGD on 10/21/2011 which revealed mild changes of reflux esophagitis limited to GE junction along with multiple antral erosions and [name] healing gastric ulcer and bulbar erosions. She was switched from omeprazole to pantoprazole at 40 mg twice [name] day. H. pylori serology was negative. She continues to complain of postprandial pain and right upper quadrant which radiates posteriorly. It is associated with frequent nausea and sporadic vomiting. She has had 4 episodes of vomiting since her EGD. She denies hematemesis or melena. This pain is described [name] mild to moderate and not intractable. She is convinced that her symptoms are secondary to gallbladder disease despite negative ultrasound and HIDA scan. She tells me her sister had similar symptoms in negative studies but had severely diseased gallbladder. She also complains of intermittent pain across her lower abdomen. Unlike RUQ pain this does not occur every day and not necessarily associated with meals. It may be associated with urgency and diarrhea. Most of the time her stool caliber is thin. Patient's last colonoscopy was in August 2010. Her appetite is not normal she has not lost any weight  Current Medications: Current Outpatient Prescriptions  Medication Sig Dispense Refill  . albuterol (PROVENTIL HFA;VENTOLIN HFA) 108 (90 BASE) MCG/ACT inhaler Inhale 2 puffs into the lungs every 4 (four) hours as needed. FOR SHORTNESS OF BREATH      . aspirin 325 MG tablet Take 325 mg by mouth at bedtime.       . Bevacizumab (AVASTIN) 100 MG/4ML SOLN Inject 1.25 mg into the vein every 8 (eight) weeks.      . Calcium Carbonate Antacid (TUMS E-X SUGAR FREE PO) Take 2  tablets by mouth every morning.      . Cholecalciferol (VITAMIN D) 1000 UNITS capsule Take 1,000 Units by mouth at bedtime.       Marland Kitchen HYDROcodone-acetaminophen (VICODIN) 5-500 MG per tablet Take 1 tablet by mouth every 4 (four) hours as needed. FOR PAIN      . insulin regular (NOVOLIN R,HUMULIN R) 100 units/mL injection Inject 2-10 Units into the skin 3 (three) times daily before meals. SLIDING SCALE      . lisinopril (PRINIVIL,ZESTRIL) 40 MG tablet Take 40 mg by mouth at bedtime.       . metoprolol succinate (TOPROL-XL) 25 MG 24 hr tablet Take 1 tablet (25 mg total) by mouth daily.  90 tablet  3  . Multiple Vitamins-Minerals (ICAPS PO) Take 1 capsule by mouth 2 (two) times daily.       . nitroGLYCERIN (NITROSTAT) 0.4 MG SL tablet Place 0.4 mg under the tongue every 5 (five) minutes x 3 doses as needed.      . pantoprazole (PROTONIX) 40 MG tablet Take 1 tablet (40 mg total) by mouth 2 (two) times daily before [name] meal.  60 tablet  5  . vitamin C (ASCORBIC ACID) 500 MG tablet Take 500 mg by mouth at bedtime.      . vitamin E 1000 UNIT capsule Take 1,000 Units by mouth at bedtime.       Marland Kitchen zinc gluconate 50 MG tablet Take 50 mg by mouth at bedtime.         Objective: Blood pressure 116/72, pulse 76, temperature 97.8 F (  36.6 C), temperature source Oral, resp. rate 20, height 5\' 4"  (1.626 m), weight 152 lb 3.2 oz (69.037 kg). Patient is alert and appears to be in no acute distress Conjunctiva is pink. Sclera is nonicteric Oropharyngeal mucosa is normal. No neck masses or thyromegaly noted. Cardiac exam with regular rhythm normal S1 and S2. No murmur or gallop noted. Lungs are clear to auscultation. Abdomen abdomen is full. Bowel sounds are normal. No bruits noted. She has mild tenderness across her lower abdomen and also had right upper quadrant. He reports discomfort in right upper quadrant during palpation at LLQ. No organomegaly or masses.  No LE edema or clubbing  noted.    Assessment: Patient's GI symptoms can be summarized as below.  #1. Right upper quadrant pain associated with nausea and sporadic vomiting triggered by meals. It is very suspicious for gallbladder disease but ultrasound was negative for cholelithiasis or gallbladder wall thickening and HIDA scan reveals EF of 76% but it appears her pain was reproduced. Endoscopic findings would not explain patient's symptoms. Therefore surgical consultation is sought what further evaluate her symptoms of abdominopelvic CT to make sure she does not have pancreatic lesion disease to visceral arteries. #2. Intermittent lower abdominal pain appears to be secondary to IBS.   Plan:  Dicyclomine 10 mg by mouth before breakfast and lunch daily. Continue pantoprazole at 40 mg by mouth twice [name] day. Abdominal pelvic CT with contrast to be scheduled.

## 2011-11-16 NOTE — Patient Instructions (Signed)
Abdominopelvic CT to be scheduled. Call if you have any side effects with dicyclomine.

## 2011-11-17 ENCOUNTER — Encounter (INDEPENDENT_AMBULATORY_CARE_PROVIDER_SITE_OTHER): Payer: Self-pay | Admitting: Internal Medicine

## 2011-11-18 ENCOUNTER — Other Ambulatory Visit: Payer: Self-pay | Admitting: Cardiology

## 2011-11-18 MED ORDER — METOPROLOL SUCCINATE ER 25 MG PO TB24: 25.0000 mg | ORAL_TABLET | Freq: Every day | ORAL | Status: DC

## 2011-11-24 ENCOUNTER — Other Ambulatory Visit (INDEPENDENT_AMBULATORY_CARE_PROVIDER_SITE_OTHER): Payer: Self-pay | Admitting: Internal Medicine

## 2011-11-30 ENCOUNTER — Ambulatory Visit (HOSPITAL_COMMUNITY)
Admission: RE | Admit: 2011-11-30 | Discharge: 2011-11-30 | Disposition: A | Discharge status: Home / Self Care | Payer: Medicare Other | Source: Ambulatory Visit | Attending: Internal Medicine | Admitting: Internal Medicine

## 2011-11-30 DIAGNOSIS — K228 Other specified diseases of esophagus: Secondary | ICD-10-CM | POA: Insufficient documentation

## 2011-11-30 DIAGNOSIS — I10 Essential (primary) hypertension: Secondary | ICD-10-CM | POA: Insufficient documentation

## 2011-11-30 DIAGNOSIS — K279 Peptic ulcer, site unspecified, unspecified as acute or chronic, without hemorrhage or perforation: Secondary | ICD-10-CM

## 2011-11-30 DIAGNOSIS — R1031 Right lower quadrant pain: Secondary | ICD-10-CM

## 2011-11-30 DIAGNOSIS — K2289 Other specified disease of esophagus: Secondary | ICD-10-CM | POA: Insufficient documentation

## 2011-11-30 DIAGNOSIS — E119 Type 2 diabetes mellitus without complications: Secondary | ICD-10-CM | POA: Insufficient documentation

## 2011-11-30 DIAGNOSIS — R1011 Right upper quadrant pain: Secondary | ICD-10-CM | POA: Insufficient documentation

## 2011-11-30 MED ORDER — IOHEXOL 300 MG/ML  SOLN
100.0000 mL | Freq: Once | INTRAMUSCULAR | Status: AC | PRN
  Administered 2011-11-30: 100 mL via INTRAVENOUS

## 2011-12-30 ENCOUNTER — Encounter (INDEPENDENT_AMBULATORY_CARE_PROVIDER_SITE_OTHER): Payer: Self-pay | Admitting: *Deleted

## 2011-12-31 ENCOUNTER — Encounter (INDEPENDENT_AMBULATORY_CARE_PROVIDER_SITE_OTHER): Payer: Self-pay

## 2012-01-10 ENCOUNTER — Ambulatory Visit (INDEPENDENT_AMBULATORY_CARE_PROVIDER_SITE_OTHER): Payer: Medicare Other | Admitting: Internal Medicine

## 2012-01-27 ENCOUNTER — Other Ambulatory Visit (INDEPENDENT_AMBULATORY_CARE_PROVIDER_SITE_OTHER): Payer: Self-pay | Admitting: *Deleted

## 2012-01-27 ENCOUNTER — Telehealth (INDEPENDENT_AMBULATORY_CARE_PROVIDER_SITE_OTHER): Payer: Self-pay | Admitting: *Deleted

## 2012-01-27 ENCOUNTER — Encounter (INDEPENDENT_AMBULATORY_CARE_PROVIDER_SITE_OTHER): Payer: Self-pay | Admitting: *Deleted

## 2012-01-27 DIAGNOSIS — Z86010 Personal history of colonic polyps: Secondary | ICD-10-CM

## 2012-01-27 DIAGNOSIS — Z800 Family history of malignant neoplasm of digestive organs: Secondary | ICD-10-CM

## 2012-01-27 MED ORDER — PEG-KCL-NACL-NASULF-NA ASC-C 100 G PO SOLR: 1.0000 | Freq: Once | ORAL | Status: DC

## 2012-01-27 NOTE — Telephone Encounter (Signed)
Patient needs movi prep 

## 2012-02-21 ENCOUNTER — Encounter (INDEPENDENT_AMBULATORY_CARE_PROVIDER_SITE_OTHER): Payer: Self-pay | Admitting: *Deleted

## 2012-03-01 ENCOUNTER — Telehealth (INDEPENDENT_AMBULATORY_CARE_PROVIDER_SITE_OTHER): Payer: Self-pay | Admitting: *Deleted

## 2012-03-01 NOTE — Telephone Encounter (Signed)
PCP/Requesting MD: fagan  Name & DOB: allyx pinnix 07/29/1940   Procedure: tcs  Reason/Indication:  Hx polyps, fam hx colon ca  Has patient had this procedure before?  yes  If so, when, by whom and where?  8/10  Is there a family history of colon cancer?  yes  Who?  What age when diagnosed?  sister  Is patient diabetic?   yes      Does patient have prosthetic heart valve?  no  Do you have a pacemaker?  no  Has patient had joint replacement within last 12 months?  no  Is patient on Coumadin, Plavix and/or Aspirin? yes  Medications: asa 325 mg daily, protonix 40 mg daily, dicyclomine 10 mg daily, lisinopril 40 mg daily, novolin insulin 38 units daily, novolin sliding scale, metoprolol 25 mg daily, vitamins  Allergies: sulfur, pcn  Medication Adjustment: asa 2 days, decrease novolin insulin to 20 units  Procedure date & time: 03/30/12 at 730

## 2012-03-01 NOTE — Telephone Encounter (Signed)
triage

## 2012-03-02 NOTE — Telephone Encounter (Signed)
agree

## 2012-03-22 ENCOUNTER — Encounter (HOSPITAL_COMMUNITY): Payer: Self-pay | Admitting: Pharmacy Technician

## 2012-03-24 ENCOUNTER — Telehealth (INDEPENDENT_AMBULATORY_CARE_PROVIDER_SITE_OTHER): Payer: Self-pay | Admitting: *Deleted

## 2012-03-24 MED ORDER — PEG-KCL-NACL-NASULF-NA ASC-C 100 G PO SOLR: 1.0000 | Freq: Once | ORAL | Status: DC

## 2012-03-24 NOTE — Telephone Encounter (Signed)
Please resend, patient states pharmacy didn't have

## 2012-03-30 ENCOUNTER — Encounter (HOSPITAL_COMMUNITY): Payer: Self-pay

## 2012-03-30 ENCOUNTER — Ambulatory Visit (HOSPITAL_COMMUNITY)
Admission: RE | Admit: 2012-03-30 | Discharge: 2012-03-30 | Disposition: A | Discharge status: Home / Self Care | Payer: Medicare Other | Source: Ambulatory Visit | Attending: Internal Medicine | Admitting: Internal Medicine

## 2012-03-30 ENCOUNTER — Encounter (HOSPITAL_COMMUNITY)
Admission: RE | Disposition: A | Discharge status: Home / Self Care | Payer: Self-pay | Source: Ambulatory Visit | Attending: Internal Medicine

## 2012-03-30 DIAGNOSIS — E119 Type 2 diabetes mellitus without complications: Secondary | ICD-10-CM | POA: Insufficient documentation

## 2012-03-30 DIAGNOSIS — K5730 Diverticulosis of large intestine without perforation or abscess without bleeding: Secondary | ICD-10-CM | POA: Insufficient documentation

## 2012-03-30 DIAGNOSIS — I10 Essential (primary) hypertension: Secondary | ICD-10-CM | POA: Insufficient documentation

## 2012-03-30 DIAGNOSIS — D126 Benign neoplasm of colon, unspecified: Secondary | ICD-10-CM | POA: Insufficient documentation

## 2012-03-30 DIAGNOSIS — Z800 Family history of malignant neoplasm of digestive organs: Secondary | ICD-10-CM | POA: Insufficient documentation

## 2012-03-30 DIAGNOSIS — Z860100 Personal history of colon polyps, unspecified: Secondary | ICD-10-CM | POA: Insufficient documentation

## 2012-03-30 DIAGNOSIS — Z01812 Encounter for preprocedural laboratory examination: Secondary | ICD-10-CM | POA: Insufficient documentation

## 2012-03-30 DIAGNOSIS — D129 Benign neoplasm of anus and anal canal: Secondary | ICD-10-CM

## 2012-03-30 DIAGNOSIS — D128 Benign neoplasm of rectum: Secondary | ICD-10-CM

## 2012-03-30 DIAGNOSIS — Z794 Long term (current) use of insulin: Secondary | ICD-10-CM | POA: Insufficient documentation

## 2012-03-30 HISTORY — DX: Dorsalgia, unspecified: M54.9

## 2012-03-30 HISTORY — PX: COLONOSCOPY: SHX5424

## 2012-03-30 HISTORY — DX: Cerebral infarction, unspecified: I63.9

## 2012-03-30 HISTORY — DX: Other chronic pain: G89.29

## 2012-03-30 LAB — GLUCOSE, CAPILLARY: Glucose-Capillary: 210 mg/dL — ABNORMAL HIGH (ref 70–99)

## 2012-03-30 SURGERY — COLONOSCOPY
Anesthesia: Moderate Sedation

## 2012-03-30 MED ORDER — NYSTATIN-TRIAMCINOLONE 100000-0.1 UNIT/GM-% EX OINT: TOPICAL_OINTMENT | Freq: Two times a day (BID) | CUTANEOUS | Status: DC

## 2012-03-30 MED ORDER — SODIUM CHLORIDE 0.45 % IV SOLN
INTRAVENOUS | Status: DC
  Administered 2012-03-30: 07:00:00 via INTRAVENOUS

## 2012-03-30 MED ORDER — STERILE WATER FOR IRRIGATION IR SOLN
Status: DC | PRN
  Administered 2012-03-30: 08:00:00

## 2012-03-30 MED ORDER — MIDAZOLAM HCL 5 MG/5ML IJ SOLN
INTRAMUSCULAR | Status: DC | PRN
  Administered 2012-03-30 (×2): 2 mg via INTRAVENOUS

## 2012-03-30 MED ORDER — MIDAZOLAM HCL 5 MG/5ML IJ SOLN
INTRAMUSCULAR | Status: AC
  Filled 2012-03-30: qty 10

## 2012-03-30 MED ORDER — MEPERIDINE HCL 50 MG/ML IJ SOLN
INTRAMUSCULAR | Status: AC
  Filled 2012-03-30: qty 1

## 2012-03-30 MED ORDER — MEPERIDINE HCL 50 MG/ML IJ SOLN
INTRAMUSCULAR | Status: DC | PRN
  Administered 2012-03-30 (×2): 25 mg via INTRAVENOUS

## 2012-03-30 NOTE — H&P (Signed)
Barbara [name] is an 71 y.o. female.   Chief Complaint: Patient is in for colonoscopy. HPI: Patient is 71 year old Caucasian female with history of colonic adenomas and is here for surveillance colonoscopy. Her last exam was 3 years ago at [facility] in [facility]. He did notice some hematochezia after she movie prep. She denies frank bleeding. She has good appetite and weight stable. She still has intermittent epigastric pain. She has history of PUD. Family history is significant for colon carcinoma sister was diagnosed at age 40 and is doing fine 4 years later.  Past Medical History  Diagnosis Date  . Hypertension   . Hyperlipidemia     total cholesterol of 271 and LDL of 187 in 07/2010  . 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 Macular degeneration   . Diabetes mellitus     A1c of 7.6 in 04/2010; moderate to high dose insulin  . Tobacco abuse   . Asthma     Mild  . Palpitations   . Chest pain   . GERD (gastroesophageal reflux disease)   . Stroke 1993 2008    short term memory loss  . Chronic back pain     Past Surgical History  Procedure Date  . Cataract extraction     left  . Appendectomy   . Tubal ligation   . Colonoscopy 2009  . Esophagogastroduodenoscopy 10/21/2011    Procedure: ESOPHAGOGASTRODUODENOSCOPY (EGD);  Surgeon: Malissa Hippo, MD;  Location: [facility];  Service: Endoscopy;  Laterality: N/A;  1200    History reviewed. No pertinent family history. Social History:  reports that she has quit smoking. Her smoking use included Cigarettes. She has a 7.5 pack-year smoking history. She has never used smokeless tobacco. She reports that she does not drink alcohol or use illicit drugs.  Allergies:  Allergies  Allergen Reactions  . Bee Venom Anaphylaxis  . Sulfonamide  Derivatives Nausea And Vomiting  . Penicillins Itching and Rash    Medications Prior to Admission  Medication Sig Dispense Refill  . aspirin 325 MG tablet Take 325 mg by mouth at bedtime.       . Bevacizumab (AVASTIN) 100 MG/4ML SOLN Inject 1.25 mg into the vein every 8 (eight) weeks.      Marland Kitchen BIOTIN 5000 PO Take 1 tablet by mouth daily.      . Calcium Carbonate Antacid (TUMS E-X SUGAR FREE PO) Take 2 tablets by mouth every morning.      . Cholecalciferol (VITAMIN D) 1000 UNITS capsule Take 1,000 Units by mouth at bedtime.       Marland Kitchen HYDROcodone-acetaminophen (VICODIN) 5-500 MG per tablet Take 1 tablet by mouth every 4 (four) hours as needed. FOR PAIN      . insulin NPH (HUMULIN N,NOVOLIN N) 100 UNIT/ML injection Inject 38 Units into the skin daily.      . insulin regular (NOVOLIN R,HUMULIN R) 100 units/mL injection Inject 2-10 Units into the skin 3 (three) times daily before meals. SLIDING SCALE      . lisinopril (PRINIVIL,ZESTRIL) 40 MG tablet Take 40 mg by mouth at bedtime.       . metoprolol succinate (TOPROL-XL) 25 MG 24 hr tablet Take 1 tablet (25 mg total) by mouth daily.  90 tablet  3  . Multiple Vitamins-Minerals (ICAPS PO) Take 1 capsule by mouth  2 (two) times daily.       . pantoprazole (PROTONIX) 40 MG tablet Take 1 tablet (40 mg total) by mouth 2 (two) times daily before a meal.  60 tablet  5  . peg 3350 powder (MOVIPREP) 100 G SOLR Take 1 kit (100 g total) by mouth once.  1 kit  0  . Polyethyl Glycol-Propyl Glycol (SYSTANE OP) Apply 1 drop to eye 2 (two) times daily.      . vitamin C (ASCORBIC ACID) 500 MG tablet Take 1,000 mg by mouth at bedtime.       . vitamin E 1000 UNIT capsule Take 1,000 Units by mouth at bedtime.       Marland Kitchen zinc gluconate 50 MG tablet Take 50 mg by mouth at bedtime.      Marland Kitchen albuterol (PROVENTIL HFA;VENTOLIN HFA) 108 (90 BASE) MCG/ACT inhaler Inhale 2 puffs into the lungs every 4 (four) hours as needed. FOR SHORTNESS OF BREATH      . nitroGLYCERIN (NITROSTAT) 0.4  MG SL tablet Place 0.4 mg under the tongue every 5 (five) minutes x 3 doses as needed.        Results for orders placed during the hospital encounter of 03/30/12 (from the past 48 hour(s))  GLUCOSE, CAPILLARY     Status: Abnormal   Collection Time   03/30/12  7:07 AM      Component Value Range Comment   Glucose-Capillary 210 (*) 70 - 99 mg/dL    No results found.  ROS  Blood pressure 121/56, pulse 67, temperature 98.1 F (36.7 C), temperature source Oral, resp. rate 20, height 5\' 4"  (1.626 m), weight 152 lb (68.947 kg), SpO2 96.00%. Physical Exam  Constitutional: She appears well-developed and well-nourished.  HENT:  Mouth/Throat: Oropharynx is clear and moist.  Eyes: Conjunctivae normal are normal. No scleral icterus.  Neck: No thyromegaly present.  Cardiovascular: Normal rate, regular rhythm and normal heart sounds.   No murmur heard. Respiratory: Effort normal and breath sounds normal.  GI: She exhibits no distension and no mass. There is Tenderness: mild epigastric tenderness..  Musculoskeletal: She exhibits no edema.  Lymphadenopathy:    She has no cervical adenopathy.  Neurological: She is alert.  Skin: Skin is warm and dry.     Assessment/Plan History of colonic adenomas. Family history of colorectal carcinoma in a first-degree relative late onset. Surveillance colonoscopy  Barbara [name] U 03/30/2012, 7:37 AM

## 2012-03-30 NOTE — Op Note (Signed)
COLONOSCOPY PROCEDURE REPORT  PATIENT:  Barbara [name]  MR#:  [id number] Birthdate:  04-25-1941, 71 y.o., female Endoscopist:  Dr. Malissa Hippo, MD Referred By:  Dr. Carylon Perches, MD Procedure Date: 03/30/2012  Procedure:   Colonoscopy with snare polypectomy.  Indications: Patient is 71 year old Caucasian female with history of colonic polyps and family history of colon carcinoma. Patient's last colonoscopy was 3 years ago at [facility] in [facility].  Informed Consent:  The procedure and risks were reviewed with the patient and informed consent was obtained.  Medications:  Demerol 50 mg IV Versed 4 mg IV  Description of procedure:  After a digital rectal exam was performed, that colonoscope was advanced from the anus through the rectum and colon to the area of the cecum, ileocecal valve and appendiceal orifice. The cecum was deeply intubated. These structures were well-seen and photographed for the record. From the level of the cecum and ileocecal valve, the scope was slowly and cautiously withdrawn. The mucosal surfaces were carefully surveyed utilizing scope tip to flexion to facilitate fold flattening as needed. The scope was pulled down into the rectum where a thorough exam including retroflexion was performed.  Findings:   Prep satisfactory. Polyp at cecum across from ileocecal valve next to a scar.  It was about 10 x 6 mm. Polyp was snared piecemeal. Polypectomy complete. Another small cecal polyp was coagulated using snare tip. Few scattered diverticula at sigmoid colon. Three small polyps the rectum where ablated using snare tip. Hemorrhoids below the dentate line.   Therapeutic/Diagnostic Maneuvers Performed:  See above  Complications:  None  Cecal Withdrawal Time:  19 minutes  Impression:  Examination performed to cecum. 10 x 6 mm cecal polyp snared piecemeal. This polyp appeared to be recurrence of previous polypectomy site. Scattered diverticula at sigmoid  colon. Three small rectal polyps coagulated using snare tip. External hemorrhoids.  Recommendations:  No aspirin for 5 days. I would be contacting patient with biopsy results and further recommendations.  Juelle Dickmann U  03/30/2012 8:18 AM  CC: Dr. Carylon Perches, MD & Dr. Bonnetta Barry ref. provider found

## 2012-04-04 ENCOUNTER — Encounter (HOSPITAL_COMMUNITY): Payer: Self-pay | Admitting: Internal Medicine

## 2012-04-04 ENCOUNTER — Encounter (INDEPENDENT_AMBULATORY_CARE_PROVIDER_SITE_OTHER): Payer: Self-pay | Admitting: *Deleted

## 2012-10-20 ENCOUNTER — Other Ambulatory Visit: Payer: Self-pay | Admitting: *Deleted

## 2012-10-20 MED ORDER — METOPROLOL SUCCINATE ER 25 MG PO TB24: 25.0000 mg | ORAL_TABLET | Freq: Every day | ORAL | Status: DC

## 2013-02-12 ENCOUNTER — Encounter: Payer: Self-pay | Admitting: *Deleted

## 2013-02-27 ENCOUNTER — Other Ambulatory Visit (HOSPITAL_COMMUNITY): Payer: Self-pay | Admitting: Internal Medicine

## 2013-02-27 ENCOUNTER — Ambulatory Visit (HOSPITAL_COMMUNITY)
Admission: RE | Admit: 2013-02-27 | Discharge: 2013-02-27 | Disposition: A | Discharge status: Home / Self Care | Payer: Medicare Other | Source: Ambulatory Visit | Attending: Internal Medicine | Admitting: Internal Medicine

## 2013-02-27 DIAGNOSIS — I700 Atherosclerosis of aorta: Secondary | ICD-10-CM | POA: Insufficient documentation

## 2013-02-27 DIAGNOSIS — M549 Dorsalgia, unspecified: Secondary | ICD-10-CM

## 2013-02-27 DIAGNOSIS — M51379 Other intervertebral disc degeneration, lumbosacral region without mention of lumbar back pain or lower extremity pain: Secondary | ICD-10-CM | POA: Insufficient documentation

## 2013-02-27 DIAGNOSIS — M5137 Other intervertebral disc degeneration, lumbosacral region: Secondary | ICD-10-CM | POA: Insufficient documentation

## 2013-02-28 ENCOUNTER — Other Ambulatory Visit (HOSPITAL_COMMUNITY): Payer: Self-pay | Admitting: Internal Medicine

## 2013-02-28 DIAGNOSIS — M549 Dorsalgia, unspecified: Secondary | ICD-10-CM

## 2013-03-01 ENCOUNTER — Ambulatory Visit (HOSPITAL_COMMUNITY)
Admission: RE | Admit: 2013-03-01 | Discharge: 2013-03-01 | Disposition: A | Discharge status: Home / Self Care | Payer: Medicare Other | Source: Ambulatory Visit | Attending: Internal Medicine | Admitting: Internal Medicine

## 2013-03-01 DIAGNOSIS — M47814 Spondylosis without myelopathy or radiculopathy, thoracic region: Secondary | ICD-10-CM | POA: Insufficient documentation

## 2013-03-01 DIAGNOSIS — IMO0002 Reserved for concepts with insufficient information to code with codable children: Secondary | ICD-10-CM | POA: Insufficient documentation

## 2013-03-01 DIAGNOSIS — R209 Unspecified disturbances of skin sensation: Secondary | ICD-10-CM | POA: Insufficient documentation

## 2013-03-01 DIAGNOSIS — M549 Dorsalgia, unspecified: Secondary | ICD-10-CM

## 2013-08-14 ENCOUNTER — Emergency Department (HOSPITAL_COMMUNITY): Payer: Medicare Other

## 2013-08-14 ENCOUNTER — Encounter (HOSPITAL_COMMUNITY): Payer: Self-pay | Admitting: Emergency Medicine

## 2013-08-14 ENCOUNTER — Inpatient Hospital Stay (HOSPITAL_COMMUNITY)
Admission: EM | Admit: 2013-08-14 | Discharge: 2013-08-16 | DRG: 310 | Disposition: A | Discharge status: Home / Self Care | Payer: Medicare Other | Attending: Internal Medicine | Admitting: Internal Medicine

## 2013-08-14 DIAGNOSIS — K219 Gastro-esophageal reflux disease without esophagitis: Secondary | ICD-10-CM | POA: Diagnosis present

## 2013-08-14 DIAGNOSIS — I679 Cerebrovascular disease, unspecified: Secondary | ICD-10-CM

## 2013-08-14 DIAGNOSIS — E785 Hyperlipidemia, unspecified: Secondary | ICD-10-CM

## 2013-08-14 DIAGNOSIS — E119 Type 2 diabetes mellitus without complications: Secondary | ICD-10-CM

## 2013-08-14 DIAGNOSIS — R011 Cardiac murmur, unspecified: Secondary | ICD-10-CM

## 2013-08-14 DIAGNOSIS — Z7901 Long term (current) use of anticoagulants: Secondary | ICD-10-CM

## 2013-08-14 DIAGNOSIS — R002 Palpitations: Secondary | ICD-10-CM

## 2013-08-14 DIAGNOSIS — Z8249 Family history of ischemic heart disease and other diseases of the circulatory system: Secondary | ICD-10-CM

## 2013-08-14 DIAGNOSIS — I4891 Unspecified atrial fibrillation: Secondary | ICD-10-CM

## 2013-08-14 DIAGNOSIS — Z794 Long term (current) use of insulin: Secondary | ICD-10-CM

## 2013-08-14 DIAGNOSIS — R079 Chest pain, unspecified: Secondary | ICD-10-CM

## 2013-08-14 DIAGNOSIS — I10 Essential (primary) hypertension: Secondary | ICD-10-CM

## 2013-08-14 DIAGNOSIS — H3530 Unspecified macular degeneration: Secondary | ICD-10-CM | POA: Diagnosis present

## 2013-08-14 DIAGNOSIS — Z720 Tobacco use: Secondary | ICD-10-CM

## 2013-08-14 DIAGNOSIS — Z79899 Other long term (current) drug therapy: Secondary | ICD-10-CM

## 2013-08-14 DIAGNOSIS — F17200 Nicotine dependence, unspecified, uncomplicated: Secondary | ICD-10-CM | POA: Diagnosis present

## 2013-08-14 DIAGNOSIS — Z5181 Encounter for therapeutic drug level monitoring: Secondary | ICD-10-CM

## 2013-08-14 DIAGNOSIS — I2510 Atherosclerotic heart disease of native coronary artery without angina pectoris: Secondary | ICD-10-CM

## 2013-08-14 DIAGNOSIS — Z8673 Personal history of transient ischemic attack (TIA), and cerebral infarction without residual deficits: Secondary | ICD-10-CM

## 2013-08-14 DIAGNOSIS — J45909 Unspecified asthma, uncomplicated: Secondary | ICD-10-CM

## 2013-08-14 DIAGNOSIS — J449 Chronic obstructive pulmonary disease, unspecified: Secondary | ICD-10-CM

## 2013-08-14 LAB — COMPREHENSIVE METABOLIC PANEL
ALK PHOS: 95 U/L (ref 39–117)
ALT: 15 U/L (ref 0–35)
AST: 19 U/L (ref 0–37)
Albumin: 3.7 g/dL (ref 3.5–5.2)
BUN: 20 mg/dL (ref 6–23)
CALCIUM: 9.4 mg/dL (ref 8.4–10.5)
CO2: 24 meq/L (ref 19–32)
Chloride: 98 mEq/L (ref 96–112)
Creatinine, Ser: 0.83 mg/dL (ref 0.50–1.10)
GFR, EST AFRICAN AMERICAN: 81 mL/min — AB (ref >90)
GFR, EST NON AFRICAN AMERICAN: 70 mL/min — AB (ref >90)
GLUCOSE: 185 mg/dL — AB (ref 70–99)
Potassium: 4.8 mEq/L (ref 3.7–5.3)
Sodium: 134 mEq/L — ABNORMAL LOW (ref 137–147)
TOTAL PROTEIN: 7.2 g/dL (ref 6.0–8.3)
Total Bilirubin: <0.2 mg/dL — ABNORMAL LOW (ref 0.3–1.2)

## 2013-08-14 LAB — CBC
HEMATOCRIT: 38.4 % (ref 36.0–46.0)
HEMOGLOBIN: 12.8 g/dL (ref 12.0–15.0)
MCH: 30.1 pg (ref 26.0–34.0)
MCHC: 33.3 g/dL (ref 30.0–36.0)
MCV: 90.4 fL (ref 78.0–100.0)
Platelets: 237 10*3/uL (ref 150–400)
RBC: 4.25 MIL/uL (ref 3.87–5.11)
RDW: 13.1 % (ref 11.5–15.5)
WBC: 9.3 10*3/uL (ref 4.0–10.5)

## 2013-08-14 LAB — TROPONIN I

## 2013-08-14 MED ORDER — ASPIRIN 81 MG PO CHEW
324.0000 mg | CHEWABLE_TABLET | Freq: Once | ORAL | Status: AC
  Administered 2013-08-14: 324 mg via ORAL
  Filled 2013-08-14: qty 4

## 2013-08-14 MED ORDER — SODIUM CHLORIDE 0.9 % IV SOLN
20.0000 mL | INTRAVENOUS | Status: DC
  Administered 2013-08-14: 20 mL via INTRAVENOUS

## 2013-08-14 MED ORDER — DILTIAZEM HCL 100 MG IV SOLR
5.0000 mg/h | INTRAVENOUS | Status: DC
  Administered 2013-08-14: 5 mg/h via INTRAVENOUS
  Filled 2013-08-14: qty 100

## 2013-08-14 NOTE — ED Provider Notes (Signed)
CSN: [id number]     Arrival date & time 08/14/13  2234 History  This chart was scribed for Teressa Lower, MD by Jenne Campus, ED Scribe. This patient was seen in room APA04/APA04 and the patient's care was started at 11:04 PM.    Chief Complaint  Patient presents with  . Chest Pain      Patient is a 73 y.o. female presenting with chest pain. The history is provided by the patient. No language interpreter was used.  Chest Pain Pain location:  Substernal area Pain quality: pressure   Pain radiates to:  L arm, R arm, L jaw and R jaw Duration:  3 hours Timing:  Constant Progression:  Improving Context: at rest   Associated symptoms: diaphoresis, nausea, palpitations and shortness of breath   Associated symptoms: no cough and not vomiting     HPI Comments: Barbara [name] is a 73 y.o. female with a h/o A. Fib per pt who presents to the Emergency Department complaining of CP described as heaviness with radiation into the bilateral jaws and bilateral arms. The pain started around 8 PM tonight while at rest with associated nausea, SOB and diaphoresis during the episode as well. She rates her pain an 8 out of 10 currently. She states that she has been having palpitations for the past few weeks and admits that she was having palpations tonight prior to developing the CP. She denies any leg pain or swelling. She is currently on ASA but denies any other anticoagulant therapy due to a prior TIA.  Last cardiac cath was in 201 and was that showed mild CAD. She had a normal stress nuclear and echo in 2011.  Cards is Dr. Lattie Haw, she has an appt with Dr. Harl Bowie at [facility]    Past Medical History  Diagnosis Date  . Hypertension   . Hyperlipidemia     total cholesterol of 271 and LDL of 187 in 07/2010  . Arteriosclerotic cardiovascular disease (ASCVD)     non-obstructive; negative stress nuclear and normal [facility] 08/[facility] in 03/2000-20%  main 20%  LAD, 30% circumflex, 30% RCA, hyperdynamic LV  . Macular degeneration   . Diabetes mellitus     A1c of 7.6 in 04/2010; moderate to high dose insulin  . Tobacco abuse   . Asthma     Mild  . Palpitations   . Chest pain   . GERD (gastroesophageal reflux disease)   . Stroke 1993 2008    short term memory loss  . Chronic back pain    Past Surgical History  Procedure Laterality Date  . Cataract extraction      left  . Appendectomy    . Tubal ligation    . Colonoscopy  2009  . Esophagogastroduodenoscopy  10/21/2011    Procedure: ESOPHAGOGASTRODUODENOSCOPY (EGD);  Surgeon: Rogene Houston, MD;  Location: [facility];  Service: Endoscopy;  Laterality: N/A;  1200  . Colonoscopy  03/30/2012    Procedure: COLONOSCOPY;  Surgeon: Rogene Houston, MD;  Location: [facility];  Service: Endoscopy;  Laterality: N/A;  730   History reviewed. No pertinent family history. History  Substance Use Topics  . Smoking status: Former Smoker -- 0.50 packs/day for 15 years    Types: Cigarettes  . Smokeless tobacco: Never Used     Comment: QUIT SMOKING 6 months ago after smoking20 yrs  . Alcohol Use: No   No OB history provided.  Review of Systems  Constitutional:  Positive for diaphoresis.  Respiratory: Positive for shortness of breath. Negative for cough.   Cardiovascular: Positive for chest pain and palpitations. Negative for leg swelling.  Gastrointestinal: Positive for nausea. Negative for vomiting.  Musculoskeletal: Negative for myalgias.  All other systems reviewed and are negative.     Allergies  Bee venom; Sulfonamide derivatives; and Penicillins  Home Medications   Current Outpatient Rx  Name  Route  Sig  Dispense  Refill  . albuterol (PROVENTIL HFA;VENTOLIN HFA) 108 (90 BASE) MCG/ACT inhaler   Inhalation   Inhale 2 puffs into the lungs every 4 (four) hours as needed. FOR SHORTNESS OF BREATH         . Bevacizumab (AVASTIN) 100 MG/4ML SOLN   Intravenous   Inject 1.25 mg into  the vein every 8 (eight) weeks.         Marland Kitchen BIOTIN 5000 PO   Oral   Take 1 tablet by mouth daily.         . Calcium Carbonate Antacid (TUMS E-X SUGAR FREE PO)   Oral   Take 2 tablets by mouth every morning.         . Cholecalciferol (VITAMIN D) 1000 UNITS capsule   Oral   Take 1,000 Units by mouth at bedtime.          Marland Kitchen HYDROcodone-acetaminophen (VICODIN) 5-500 MG per tablet   Oral   Take 1 tablet by mouth every 4 (four) hours as needed. FOR PAIN         . insulin NPH (HUMULIN N,NOVOLIN N) 100 UNIT/ML injection   Subcutaneous   Inject 38 Units into the skin daily.         . insulin regular (NOVOLIN R,HUMULIN R) 100 units/mL injection   Subcutaneous   Inject 2-10 Units into the skin 3 (three) times daily before meals. SLIDING SCALE         . lisinopril (PRINIVIL,ZESTRIL) 40 MG tablet   Oral   Take 40 mg by mouth at bedtime.          . metoprolol succinate (TOPROL-XL) 25 MG 24 hr tablet   Oral   Take 1 tablet (25 mg total) by mouth daily.   90 tablet   0     Patient needs to schedule follow up for further re ...   . Multiple Vitamins-Minerals (ICAPS PO)   Oral   Take 1 capsule by mouth 2 (two) times daily.          Marland Kitchen EXPIRED: nitroGLYCERIN (NITROSTAT) 0.4 MG SL tablet   Sublingual   Place 0.4 mg under the tongue every 5 (five) minutes x 3 doses as needed.         . nystatin-triamcinolone ointment (MYCOLOG)   Topical   Apply topically 2 (two) times daily. Applied to the anal canal twice daily for 10 days and thereafter on as needed basis   60 g   0   . EXPIRED: pantoprazole (PROTONIX) 40 MG tablet   Oral   Take 1 tablet (40 mg total) by mouth 2 (two) times daily before a meal.   60 tablet   5   . Polyethyl Glycol-Propyl Glycol (SYSTANE OP)   Ophthalmic   Apply 1 drop to eye 2 (two) times daily.         . vitamin C (ASCORBIC ACID) 500 MG tablet   Oral   Take 1,000 mg by mouth at bedtime.          . vitamin E 1000 UNIT  capsule    Oral   Take 1,000 Units by mouth at bedtime.          Marland Kitchen zinc gluconate 50 MG tablet   Oral   Take 50 mg by mouth at bedtime.          Triage Vitals: BP 114/90  Pulse 53  Temp(Src) 97.9 F (36.6 C) (Oral)  Resp 20  Ht 5\' 4"  (1.626 m)  Wt 150 lb (68.04 kg)  BMI 25.73 kg/m2  SpO2 98%  Physical Exam  Nursing note and vitals reviewed. Constitutional: She is oriented to person, place, and time. She appears well-developed and well-nourished. No distress.  HENT:  Head: Normocephalic and atraumatic.  Eyes: EOM are normal.  Neck: Neck supple. No tracheal deviation present.  Cardiovascular: An irregular rhythm present. Tachycardia present.   Pulmonary/Chest: Effort normal and breath sounds normal. No respiratory distress.  Musculoskeletal: Normal range of motion. She exhibits edema (trace symmetric pre-tibal edema ). She exhibits no tenderness (no calf tenderness ).  Neurological: She is alert and oriented to person, place, and time.  Skin: Skin is warm and dry.  Psychiatric: She has a normal mood and affect. Her behavior is normal.    ED Course  Procedures (including critical care time)  DIAGNOSTIC STUDIES: Oxygen Saturation is 98% on RA, normal by my interpretation.    COORDINATION OF CARE: 11:08 PM-Discussed treatment plan which includes CXR, CBC panel, CMP and possible transfer to [facility] with pt at bedside and pt agreed to plan.   11:45 PM- Pt rechecked. HR is 70s to 90s and irregular. Pt still reports a 6 out of 10 pain.  Labs Review Labs Reviewed  COMPREHENSIVE METABOLIC PANEL - Abnormal; Notable for the following:    Sodium 134 (*)    Glucose, Bld 185 (*)    Total Bilirubin <0.2 (*)    GFR calc non Af Amer 70 (*)    GFR calc Af Amer 81 (*)    All other components within normal limits  CBC  TROPONIN I   Imaging Review Dg Chest Portable 1 View  08/14/2013   CLINICAL DATA:  Chest pain.  EXAM: PORTABLE CHEST - 1 VIEW  COMPARISON:  None available for comparison at  time of study interpretation.  FINDINGS: The cardiac silhouette appears mildly enlarged, mediastinal silhouette is nonsuspicious, mildly calcified aortic knob. Mild interstitial prominence without pleural effusions or focal consolidations. No pneumothorax.  Multiple EKG lines overlie the patient and may obscure subtle underlying pathology. Soft tissue planes and included osseous structures are nonsuspicious.  IMPRESSION: Mild cardiomegaly and apparent COPD without superimposed acute cardiopulmonary process.   Electronically Signed   By: Elon Alas   On: 08/14/2013 23:41   CRITICAL CARE Performed by: Teressa Lower Total critical care time: 40 Critical care time was exclusive of separately billable procedures and treating other patients. Critical care was necessary to treat or prevent imminent or life-threatening deterioration. Critical care was time spent personally by me on the following activities: development of treatment plan with patient and/or surrogate as well as nursing, discussions with consultants, evaluation of patient's response to treatment, examination of patient, obtaining history from patient or surrogate, ordering and performing treatments and interventions, ordering and review of laboratory studies, ordering and review of radiographic studies, pulse oximetry and re-evaluation of patient's condition. ASA PO. Diltiazem drip rate control improves rate not pain. CAR consult. MED consult/ admit. IV narcotics pain control   Date: 08/15/2013  Rate: 123  Rhythm: atrial fibrillation  QRS Axis:  normal  Intervals: normal  ST/T Wave abnormalities: nonspecific ST changes  Conduction Disutrbances:none  Narrative Interpretation: rapid afib  Old EKG Reviewed: none available  12:18 AM d/w CAR [place] as above, he feels PT is appropriate for local admit to hospitalist versus CAR admit/ transfer to [facility]. Dr Arnoldo Morale will admit SDU  MDM   Dx: Chest pain, rapid afib  ECG, CXR, labs obtained/  reviewed Diltiazem rate control IV narcotics pain control   I personally performed the services described in this documentation, which was scribed in my presence. The recorded information has been reviewed and is accurate.      Teressa Lower, MD 08/15/13 [id number]

## 2013-08-14 NOTE — ED Notes (Addendum)
Pt reporting pain in mid chest, radiating into jaws and arms.  Reporting some nausea and some diaphoresis. Reports symptoms began tonight about 8. Pt reports she did have some ASA tonight.

## 2013-08-15 ENCOUNTER — Ambulatory Visit: Payer: Medicare Other | Admitting: Cardiology

## 2013-08-15 ENCOUNTER — Encounter (HOSPITAL_COMMUNITY): Payer: Self-pay | Admitting: *Deleted

## 2013-08-15 DIAGNOSIS — J449 Chronic obstructive pulmonary disease, unspecified: Secondary | ICD-10-CM

## 2013-08-15 DIAGNOSIS — I4891 Unspecified atrial fibrillation: Principal | ICD-10-CM

## 2013-08-15 DIAGNOSIS — R079 Chest pain, unspecified: Secondary | ICD-10-CM

## 2013-08-15 DIAGNOSIS — J45909 Unspecified asthma, uncomplicated: Secondary | ICD-10-CM

## 2013-08-15 DIAGNOSIS — R011 Cardiac murmur, unspecified: Secondary | ICD-10-CM

## 2013-08-15 DIAGNOSIS — I2510 Atherosclerotic heart disease of native coronary artery without angina pectoris: Secondary | ICD-10-CM

## 2013-08-15 DIAGNOSIS — I10 Essential (primary) hypertension: Secondary | ICD-10-CM

## 2013-08-15 DIAGNOSIS — E119 Type 2 diabetes mellitus without complications: Secondary | ICD-10-CM

## 2013-08-15 DIAGNOSIS — F17200 Nicotine dependence, unspecified, uncomplicated: Secondary | ICD-10-CM

## 2013-08-15 DIAGNOSIS — I7090 Unspecified atherosclerosis: Secondary | ICD-10-CM

## 2013-08-15 DIAGNOSIS — I679 Cerebrovascular disease, unspecified: Secondary | ICD-10-CM

## 2013-08-15 DIAGNOSIS — J4489 Other specified chronic obstructive pulmonary disease: Secondary | ICD-10-CM

## 2013-08-15 DIAGNOSIS — R002 Palpitations: Secondary | ICD-10-CM

## 2013-08-15 DIAGNOSIS — E785 Hyperlipidemia, unspecified: Secondary | ICD-10-CM

## 2013-08-15 DIAGNOSIS — I059 Rheumatic mitral valve disease, unspecified: Secondary | ICD-10-CM

## 2013-08-15 LAB — PROTIME-INR
INR: 1.14 (ref 0.00–1.49)
Prothrombin Time: 14.4 seconds (ref 11.6–15.2)

## 2013-08-15 LAB — BASIC METABOLIC PANEL
BUN: 17 mg/dL (ref 6–23)
CALCIUM: 8.5 mg/dL (ref 8.4–10.5)
CO2: 25 meq/L (ref 19–32)
CREATININE: 0.81 mg/dL (ref 0.50–1.10)
Chloride: 106 mEq/L (ref 96–112)
GFR calc Af Amer: 83 mL/min — ABNORMAL LOW (ref >90)
GFR calc non Af Amer: 72 mL/min — ABNORMAL LOW (ref >90)
Glucose, Bld: 124 mg/dL — ABNORMAL HIGH (ref 70–99)
Potassium: 4.7 mEq/L (ref 3.7–5.3)
Sodium: 141 mEq/L (ref 137–147)

## 2013-08-15 LAB — CBC
HCT: 36 % (ref 36.0–46.0)
Hemoglobin: 11.8 g/dL — ABNORMAL LOW (ref 12.0–15.0)
MCH: 30.1 pg (ref 26.0–34.0)
MCHC: 32.8 g/dL (ref 30.0–36.0)
MCV: 91.8 fL (ref 78.0–100.0)
PLATELETS: 210 10*3/uL (ref 150–400)
RBC: 3.92 MIL/uL (ref 3.87–5.11)
RDW: 13.2 % (ref 11.5–15.5)
WBC: 8.6 10*3/uL (ref 4.0–10.5)

## 2013-08-15 LAB — GLUCOSE, CAPILLARY
GLUCOSE-CAPILLARY: 187 mg/dL — AB (ref 70–99)
GLUCOSE-CAPILLARY: 120 mg/dL — AB (ref 70–99)
GLUCOSE-CAPILLARY: 151 mg/dL — AB (ref 70–99)
Glucose-Capillary: 68 mg/dL — ABNORMAL LOW (ref 70–99)

## 2013-08-15 LAB — TROPONIN I
Troponin I: <0.3 ng/mL (ref <0.30)
Troponin I: <0.3 ng/mL (ref <0.30)
Troponin I: <0.3 ng/mL (ref <0.30)

## 2013-08-15 LAB — HEPARIN LEVEL (UNFRACTIONATED)
Heparin Unfractionated: 0.8 IU/mL — ABNORMAL HIGH (ref 0.30–0.70)
Heparin Unfractionated: 0.48 IU/mL (ref 0.30–0.70)

## 2013-08-15 LAB — MRSA PCR SCREENING: MRSA by PCR: NEGATIVE

## 2013-08-15 MED ORDER — POLYETHYL GLYCOL-PROPYL GLYCOL 0.4-0.3 % OP SOLN: 1.0000 [drp] | Freq: Two times a day (BID) | OPHTHALMIC | Status: DC

## 2013-08-15 MED ORDER — HEPARIN (PORCINE) IN NACL 100-0.45 UNIT/ML-% IJ SOLN
750.0000 [IU]/h | INTRAMUSCULAR | Status: DC
  Administered 2013-08-15: 900 [IU]/h via INTRAVENOUS
  Administered 2013-08-15: 750 [IU]/h via INTRAVENOUS
  Filled 2013-08-15 (×2): qty 250

## 2013-08-15 MED ORDER — POLYVINYL ALCOHOL 1.4 % OP SOLN
2.0000 [drp] | Freq: Two times a day (BID) | OPHTHALMIC | Status: DC
  Administered 2013-08-15 – 2013-08-16 (×3): 2 [drp] via OPHTHALMIC
  Filled 2013-08-15: qty 15

## 2013-08-15 MED ORDER — ALUM & MAG HYDROXIDE-SIMETH 200-200-20 MG/5ML PO SUSP: 30.0000 mL | Freq: Four times a day (QID) | ORAL | Status: DC | PRN

## 2013-08-15 MED ORDER — WARFARIN - PHARMACIST DOSING INPATIENT: Status: DC

## 2013-08-15 MED ORDER — NITROGLYCERIN 2 % TD OINT
0.5000 [in_us] | TOPICAL_OINTMENT | Freq: Four times a day (QID) | TRANSDERMAL | Status: DC
  Administered 2013-08-15: 0.5 [in_us] via TOPICAL
  Filled 2013-08-15: qty 1

## 2013-08-15 MED ORDER — OXYCODONE HCL 5 MG PO TABS
5.0000 mg | ORAL_TABLET | ORAL | Status: DC | PRN
  Administered 2013-08-15: 5 mg via ORAL
  Filled 2013-08-15: qty 1

## 2013-08-15 MED ORDER — INSULIN ASPART 100 UNIT/ML ~~LOC~~ SOLN
0.0000 [IU] | Freq: Three times a day (TID) | SUBCUTANEOUS | Status: DC
  Administered 2013-08-15: 2 [IU] via SUBCUTANEOUS

## 2013-08-15 MED ORDER — PANTOPRAZOLE SODIUM 40 MG PO TBEC
40.0000 mg | DELAYED_RELEASE_TABLET | Freq: Two times a day (BID) | ORAL | Status: DC
  Administered 2013-08-15 – 2013-08-16 (×3): 40 mg via ORAL
  Filled 2013-08-15 (×3): qty 1

## 2013-08-15 MED ORDER — LISINOPRIL 10 MG PO TABS: 20.0000 mg | ORAL_TABLET | Freq: Every day | ORAL | Status: DC

## 2013-08-15 MED ORDER — DILTIAZEM HCL 30 MG PO TABS
30.0000 mg | ORAL_TABLET | Freq: Four times a day (QID) | ORAL | Status: DC
  Administered 2013-08-15 – 2013-08-16 (×4): 30 mg via ORAL
  Filled 2013-08-15 (×4): qty 1

## 2013-08-15 MED ORDER — SODIUM CHLORIDE 0.9 % IV SOLN: 250.0000 mL | INTRAVENOUS | Status: DC | PRN

## 2013-08-15 MED ORDER — ASPIRIN EC 325 MG PO TBEC
325.0000 mg | DELAYED_RELEASE_TABLET | Freq: Every day | ORAL | Status: DC
  Administered 2013-08-15: 325 mg via ORAL
  Filled 2013-08-15: qty 1

## 2013-08-15 MED ORDER — HEPARIN BOLUS VIA INFUSION
3000.0000 [IU] | Freq: Once | INTRAVENOUS | Status: AC
  Administered 2013-08-15: 3000 [IU] via INTRAVENOUS
  Filled 2013-08-15: qty 3000

## 2013-08-15 MED ORDER — SODIUM CHLORIDE 0.9 % IJ SOLN: 3.0000 mL | INTRAMUSCULAR | Status: DC | PRN

## 2013-08-15 MED ORDER — ACETAMINOPHEN 650 MG RE SUPP: 650.0000 mg | Freq: Four times a day (QID) | RECTAL | Status: DC | PRN

## 2013-08-15 MED ORDER — VITAMIN E 45 MG (100 UNIT) PO CAPS
1000.0000 [IU] | ORAL_CAPSULE | Freq: Every day | ORAL | Status: DC
  Administered 2013-08-15: 1000 [IU] via ORAL
  Filled 2013-08-15 (×2): qty 2

## 2013-08-15 MED ORDER — SODIUM CHLORIDE 0.9 % IJ SOLN
3.0000 mL | Freq: Two times a day (BID) | INTRAMUSCULAR | Status: DC
  Administered 2013-08-15: 3 mL via INTRAVENOUS

## 2013-08-15 MED ORDER — DILTIAZEM HCL ER COATED BEADS 120 MG PO CP24: 120.0000 mg | ORAL_CAPSULE | Freq: Every day | ORAL | Status: DC

## 2013-08-15 MED ORDER — VITAMIN C 500 MG PO TABS
1000.0000 mg | ORAL_TABLET | Freq: Every day | ORAL | Status: DC
  Administered 2013-08-15: 1000 mg via ORAL
  Filled 2013-08-15: qty 2

## 2013-08-15 MED ORDER — PATIENT'S GUIDE TO USING COUMADIN BOOK
Freq: Once | Status: DC
  Filled 2013-08-15: qty 1

## 2013-08-15 MED ORDER — INSULIN GLARGINE 100 UNIT/ML ~~LOC~~ SOLN
25.0000 [IU] | Freq: Every day | SUBCUTANEOUS | Status: DC
  Administered 2013-08-15 – 2013-08-16 (×2): 25 [IU] via SUBCUTANEOUS
  Filled 2013-08-15 (×3): qty 0.25

## 2013-08-15 MED ORDER — HYDROMORPHONE HCL PF 1 MG/ML IJ SOLN: 0.5000 mg | INTRAMUSCULAR | Status: DC | PRN

## 2013-08-15 MED ORDER — NITROGLYCERIN 0.4 MG SL SUBL: 0.4000 mg | SUBLINGUAL_TABLET | SUBLINGUAL | Status: DC | PRN

## 2013-08-15 MED ORDER — INSULIN ASPART 100 UNIT/ML ~~LOC~~ SOLN: 0.0000 [IU] | Freq: Every day | SUBCUTANEOUS | Status: DC

## 2013-08-15 MED ORDER — METOPROLOL SUCCINATE ER 25 MG PO TB24
25.0000 mg | ORAL_TABLET | Freq: Every day | ORAL | Status: DC
  Administered 2013-08-15 – 2013-08-16 (×2): 25 mg via ORAL
  Filled 2013-08-15 (×2): qty 1

## 2013-08-15 MED ORDER — ONDANSETRON HCL 4 MG/2ML IJ SOLN: 4.0000 mg | Freq: Four times a day (QID) | INTRAMUSCULAR | Status: DC | PRN

## 2013-08-15 MED ORDER — LISINOPRIL 10 MG PO TABS: 40.0000 mg | ORAL_TABLET | Freq: Every day | ORAL | Status: DC

## 2013-08-15 MED ORDER — WARFARIN SODIUM 5 MG PO TABS: 5.0000 mg | ORAL_TABLET | Freq: Once | ORAL | Status: DC

## 2013-08-15 MED ORDER — WARFARIN VIDEO: Freq: Once | Status: DC

## 2013-08-15 MED ORDER — INSULIN NPH (HUMAN) (ISOPHANE) 100 UNIT/ML ~~LOC~~ SUSP
38.0000 [IU] | Freq: Every day | SUBCUTANEOUS | Status: DC
  Filled 2013-08-15: qty 10

## 2013-08-15 MED ORDER — FENTANYL CITRATE 0.05 MG/ML IJ SOLN
50.0000 ug | Freq: Once | INTRAMUSCULAR | Status: AC
  Administered 2013-08-15: 50 ug via INTRAVENOUS
  Filled 2013-08-15: qty 2

## 2013-08-15 MED ORDER — ONDANSETRON HCL 4 MG PO TABS: 4.0000 mg | ORAL_TABLET | Freq: Four times a day (QID) | ORAL | Status: DC | PRN

## 2013-08-15 MED ORDER — OCUVITE-LUTEIN PO CAPS
1.0000 | ORAL_CAPSULE | Freq: Two times a day (BID) | ORAL | Status: DC
  Administered 2013-08-15 – 2013-08-16 (×3): 1 via ORAL
  Filled 2013-08-15 (×3): qty 1

## 2013-08-15 MED ORDER — VITAMIN D 1000 UNITS PO TABS
1000.0000 [IU] | ORAL_TABLET | Freq: Every day | ORAL | Status: DC
  Administered 2013-08-15: 1000 [IU] via ORAL
  Filled 2013-08-15: qty 1

## 2013-08-15 MED ORDER — ACETAMINOPHEN 325 MG PO TABS: 650.0000 mg | ORAL_TABLET | Freq: Four times a day (QID) | ORAL | Status: DC | PRN

## 2013-08-15 MED ORDER — ALBUTEROL SULFATE (2.5 MG/3ML) 0.083% IN NEBU: 2.5000 mg | INHALATION_SOLUTION | RESPIRATORY_TRACT | Status: DC | PRN

## 2013-08-15 NOTE — Progress Notes (Signed)
Heart Rate ranging from 65-77, Cardizem gtt discontinued at this time per titration orders protocol.

## 2013-08-15 NOTE — Progress Notes (Signed)
[facility] for Heparin Indication: atrial fibrillation  Allergies  Allergen Reactions  . Bee Venom Anaphylaxis  . Sulfonamide Derivatives Nausea And Vomiting  . Penicillins Itching and Rash    Patient Measurements: Height: 5\' 4"  (162.6 cm) Weight: 150 lb (68.04 kg) IBW/kg (Calculated) : 54.7 Heparin Dosing Weight: 68 kg  Vital Signs: Temp: 98.8 F (37.1 C) (03/25 2000) Temp src: Oral (03/25 2000) BP: 87/55 mmHg (03/25 2000) Pulse Rate: 42 (03/25 2000)  Labs:  Recent Labs  08/14/13 2258 08/15/13 0134 08/15/13 0444 08/15/13 0727 08/15/13 1021 08/15/13 1552 08/15/13 1918  HGB 12.8  --  11.8*  --   --   --   --   HCT 38.4  --  36.0  --   --   --   --   PLT 237  --  210  --   --   --   --   LABPROT  --   --  14.4  --   --   --   --   INR  --   --  1.14  --   --   --   --   HEPARINUNFRC  --   --   --   --  0.80*  --  0.48  CREATININE 0.83  --  0.81  --   --   --   --   TROPONINI <0.30 <0.30  --  <0.30  --  <0.30  --     Estimated Creatinine Clearance: 61.2 ml/min (by C-G formula based on Cr of 0.81).   Medical History: Past Medical History  Diagnosis Date  . Hypertension   . Hyperlipidemia     total cholesterol of 271 and LDL of 187 in 07/2010  . 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 Macular degeneration   . Diabetes mellitus     A1c of 7.6 in 04/2010; moderate to high dose insulin  . Tobacco abuse   . Asthma     Mild  . Palpitations   . Chest pain   . GERD (gastroesophageal reflux disease)   . Stroke 1993 2008    short term memory loss  . Chronic back pain     Medications:  Prescriptions prior to admission  Medication Sig Dispense Refill  . Bevacizumab (AVASTIN) 100 MG/4ML SOLN Inject 1.25 mg into the vein every 8 (eight)  weeks.      Marland Kitchen BIOTIN 5000 PO Take 1 tablet by mouth daily.      . Calcium Carbonate Antacid (TUMS E-X SUGAR FREE PO) Take 2 tablets by mouth every morning.      . Cholecalciferol (VITAMIN D) 1000 UNITS capsule Take 1,000 Units by mouth at bedtime.       Marland Kitchen HYDROcodone-acetaminophen (VICODIN) 5-500 MG per tablet Take 1 tablet by mouth every 4 (four) hours as needed. FOR PAIN      . insulin NPH (HUMULIN N,NOVOLIN N) 100 UNIT/ML injection Inject 36 Units into the skin daily before breakfast.       . insulin regular (NOVOLIN R,HUMULIN R) 100 units/mL injection Inject 2-10 Units into the skin 3 (three) times daily before meals. SLIDING SCALE      . lisinopril (PRINIVIL,ZESTRIL) 40 MG tablet Take 40 mg by mouth at bedtime.       . metoprolol tartrate (LOPRESSOR)  25 MG tablet Take 12.5-25 mg by mouth See admin instructions. Patient takes 25 mg at 0800 and 2000. She also takes 12.5 mg at [facility] if she feels her heart fluttering/racing.      . Multiple Vitamins-Minerals (ICAPS PO) Take 1 capsule by mouth 2 (two) times daily.       Vladimir Faster Glycol-Propyl Glycol (SYSTANE OP) Apply 1 drop to eye 2 (two) times daily.      . vitamin C (ASCORBIC ACID) 500 MG tablet Take 1,000 mg by mouth at bedtime.       . vitamin E 1000 UNIT capsule Take 1,000 Units by mouth at bedtime.       Marland Kitchen zinc gluconate 50 MG tablet Take 50 mg by mouth at bedtime.      . nitroGLYCERIN (NITROSTAT) 0.4 MG SL tablet Place 0.4 mg under the tongue every 5 (five) minutes x 3 doses as needed.        Assessment: 73 yo F admitted with Afib.  CHADS2 score =4.  Patient has been started on heparin.  Warfarin cancelled; noted plan for NOAC tomorrow.  Heparin level therapeutic.  No bleeding noted.   Goal of Therapy:  Heparin level 0.3-0.7 units/ml Monitor platelets by anticoagulation protocol: Yes   Plan:  Continue heparin at 750 units/hr Daily heparin level & CBC while on heparin F/U orders for [facility] tomorrow  Biagio Borg 08/15/2013,9:58 PM

## 2013-08-15 NOTE — Consult Note (Signed)
The patient was seen and examined, and I agree with the assessment and plan as documented above, with modifications as noted below. Pt admitted with tachycardia-palpitations, found to be in rapid atrial fibrillation. Has been experiencing both palpitations and chest tightness with radiation into neck/jaw and bilateral arms, accompanied by fatigue, at a minimum of 1-2 times per week and sometimes more frequently for the past several weeks. She is currently on a diltiazem infusion at 5 mg/hr along with Toprol-XL 25 mg daily for rate control, along with a heparin infusion. She smokes 0.5 ppd, and has a h/o diabetes, stroke, and HTN.  She is currently going in and out of atrial fibrillation. I will transition her to short-acting diltiazem 30 mg q 6 hours and attempt to wean her off the infusion (have discussed with her nurse). I will reduce lisinopril dose as she is relatively hypotensive, as I am concomitantly starting her on oral diltiazem. Will also d/c nitro patch and use SL nitro prn. I will continue heparin for the time being. Will obtain an echocardiogram to assess both systolic function and valvular pathology. If she has no significant valvular pathology, I will plan to start a novel anticoagulant, as he would prefer not to be on warfarin long-term due to frequent INR monitoring. Her CHADS-Vasc score is 6/9, thus giving her a high unadjusted ischemic annual stroke rate and I discussed the importance of anticoagulation with her. She has several cardiovascular risk factors, and I feel an outpatient nuclear stress test is warranted, which I can help to arrange when I follow up with her in clinic. Prior cath showed nonobstructive CAD.

## 2013-08-15 NOTE — Progress Notes (Signed)
ANTICOAGULATION CONSULT NOTE - Initial Consult  Pharmacy Consult for Heparin Indication: atrial fibrillation  Allergies  Allergen Reactions  . Bee Venom Anaphylaxis  . Sulfonamide Derivatives Nausea And Vomiting  . Penicillins Itching and Rash    Patient Measurements: Height: 5\' 4"  (162.6 cm) Weight: 150 lb (68.04 kg) IBW/kg (Calculated) : 54.7 Heparin Dosing Weight: 68 kg  Vital Signs: Temp: 97.8 F (36.6 C) (03/25 0216) Temp src: Oral (03/25 0216) BP: 110/69 mmHg (03/25 0145) Pulse Rate: 72 (03/25 0145)  Labs:  Recent Labs  08/14/13 2258 08/15/13 0134  HGB 12.8  --   HCT 38.4  --   PLT 237  --   CREATININE 0.83  --   TROPONINI <0.30 <0.30    Estimated Creatinine Clearance: 59.7 ml/min (by C-G formula based on Cr of 0.83).   Medical History: Past Medical History  Diagnosis Date  . Hypertension   . Hyperlipidemia     total cholesterol of 271 and LDL of 187 in 07/2010  . 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 Macular degeneration   . Diabetes mellitus     A1c of 7.6 in 04/2010; moderate to high dose insulin  . Tobacco abuse   . Asthma     Mild  . Palpitations   . Chest pain   . GERD (gastroesophageal reflux disease)   . Stroke 1993 2008    short term memory loss  . Chronic back pain     Medications:  Prescriptions prior to admission  Medication Sig Dispense Refill  . albuterol (PROVENTIL HFA;VENTOLIN HFA) 108 (90 BASE) MCG/ACT inhaler Inhale 2 puffs into the lungs every 4 (four) hours as needed. FOR SHORTNESS OF BREATH      . Bevacizumab (AVASTIN) 100 MG/4ML SOLN Inject 1.25 mg into the vein every 8 (eight) weeks.      Marland Kitchen BIOTIN 5000 PO Take 1 tablet by mouth daily.      . Calcium Carbonate Antacid (TUMS E-X SUGAR FREE PO) Take 2 tablets by mouth every morning.       . Cholecalciferol (VITAMIN D) 1000 UNITS capsule Take 1,000 Units by mouth at bedtime.       Marland Kitchen HYDROcodone-acetaminophen (VICODIN) 5-500 MG per tablet Take 1 tablet by mouth every 4 (four) hours as needed. FOR PAIN      . insulin NPH (HUMULIN N,NOVOLIN N) 100 UNIT/ML injection Inject 38 Units into the skin daily.      . insulin regular (NOVOLIN R,HUMULIN R) 100 units/mL injection Inject 2-10 Units into the skin 3 (three) times daily before meals. SLIDING SCALE      . lisinopril (PRINIVIL,ZESTRIL) 40 MG tablet Take 40 mg by mouth at bedtime.       . metoprolol succinate (TOPROL-XL) 25 MG 24 hr tablet Take 1 tablet (25 mg total) by mouth daily.  90 tablet  0  . Multiple Vitamins-Minerals (ICAPS PO) Take 1 capsule by mouth 2 (two) times daily.       . nitroGLYCERIN (NITROSTAT) 0.4 MG SL tablet Place 0.4 mg under the tongue every 5 (five) minutes x 3 doses as needed.      . nystatin-triamcinolone ointment (MYCOLOG) Apply topically 2 (two) times daily. Applied to the anal canal twice daily for 10 days and thereafter on as needed basis  60 g  0  . pantoprazole (PROTONIX) 40  MG tablet Take 1 tablet (40 mg total) by mouth 2 (two) times daily before a meal.  60 tablet  5  . Polyethyl Glycol-Propyl Glycol (SYSTANE OP) Apply 1 drop to eye 2 (two) times daily.      . vitamin C (ASCORBIC ACID) 500 MG tablet Take 1,000 mg by mouth at bedtime.       . vitamin E 1000 UNIT capsule Take 1,000 Units by mouth at bedtime.       Marland Kitchen zinc gluconate 50 MG tablet Take 50 mg by mouth at bedtime.        Assessment: Okay for Protocol ,  Baseline anticoag labs ordered.  Heparin to be started for Afib.  No bleeding complications noted per RN.  Goal of Therapy:  Heparin level 0.3-0.7 units/ml Monitor platelets by anticoagulation protocol: Yes   Plan:  Give 3000 units bolus x 1 Start heparin infusion at 900 units/hr Check anti-Xa level in 6-8 hours and daily while on heparin Continue to monitor H&H and  platelets  Pricilla Larsson 08/15/2013,2:39 AM

## 2013-08-15 NOTE — Progress Notes (Signed)
[facility] for Heparin---> Coumadin Indication: atrial fibrillation  Allergies  Allergen Reactions  . Bee Venom Anaphylaxis  . Sulfonamide Derivatives Nausea And Vomiting  . Penicillins Itching and Rash    Patient Measurements: Height: 5\' 4"  (162.6 cm) Weight: 150 lb (68.04 kg) IBW/kg (Calculated) : 54.7 Heparin Dosing Weight: 68 kg  Vital Signs: Temp: 97.8 F (36.6 C) (03/25 0800) Temp src: Oral (03/25 0800) BP: 96/60 mmHg (03/25 1007) Pulse Rate: 117 (03/25 1007)  Labs:  Recent Labs  08/14/13 2258 08/15/13 0134 08/15/13 0444 08/15/13 0727 08/15/13 1021  HGB 12.8  --  11.8*  --   --   HCT 38.4  --  36.0  --   --   PLT 237  --  210  --   --   LABPROT  --   --  14.4  --   --   INR  --   --  1.14  --   --   HEPARINUNFRC  --   --   --   --  0.80*  CREATININE 0.83  --  0.81  --   --   TROPONINI <0.30 <0.30  --  <0.30  --     Estimated Creatinine Clearance: 61.2 ml/min (by C-G formula based on Cr of 0.81).   Medical History: Past Medical History  Diagnosis Date  . Hypertension   . Hyperlipidemia     total cholesterol of 271 and LDL of 187 in 07/2010  . 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 Macular degeneration   . Diabetes mellitus     A1c of 7.6 in 04/2010; moderate to high dose insulin  . Tobacco abuse   . Asthma     Mild  . Palpitations   . Chest pain   . GERD (gastroesophageal reflux disease)   . Stroke 1993 2008    short term memory loss  . Chronic back pain     Medications:  Prescriptions prior to admission  Medication Sig Dispense Refill  . Bevacizumab (AVASTIN) 100 MG/4ML SOLN Inject 1.25 mg into the vein every 8 (eight) weeks.      Marland Kitchen BIOTIN 5000 PO Take 1 tablet by mouth daily.      . Calcium Carbonate Antacid (TUMS  E-X SUGAR FREE PO) Take 2 tablets by mouth every morning.      . Cholecalciferol (VITAMIN D) 1000 UNITS capsule Take 1,000 Units by mouth at bedtime.       Marland Kitchen HYDROcodone-acetaminophen (VICODIN) 5-500 MG per tablet Take 1 tablet by mouth every 4 (four) hours as needed. FOR PAIN      . insulin NPH (HUMULIN N,NOVOLIN N) 100 UNIT/ML injection Inject 36 Units into the skin daily before breakfast.       . insulin regular (NOVOLIN R,HUMULIN R) 100 units/mL injection Inject 2-10 Units into the skin 3 (three) times daily before meals. SLIDING SCALE      . lisinopril (PRINIVIL,ZESTRIL) 40 MG tablet Take 40 mg by mouth at bedtime.       . metoprolol tartrate (LOPRESSOR) 25 MG tablet Take 12.5-25 mg by mouth See admin instructions. Patient takes 25 mg at 0800 and 2000. She also takes 12.5 mg at [facility] if she feels her heart fluttering/racing.      . Multiple Vitamins-Minerals (ICAPS PO) Take 1 capsule by mouth  2 (two) times daily.       Vladimir Faster Glycol-Propyl Glycol (SYSTANE OP) Apply 1 drop to eye 2 (two) times daily.      . vitamin C (ASCORBIC ACID) 500 MG tablet Take 1,000 mg by mouth at bedtime.       . vitamin E 1000 UNIT capsule Take 1,000 Units by mouth at bedtime.       Marland Kitchen zinc gluconate 50 MG tablet Take 50 mg by mouth at bedtime.      . nitroGLYCERIN (NITROSTAT) 0.4 MG SL tablet Place 0.4 mg under the tongue every 5 (five) minutes x 3 doses as needed.        Assessment: 73 yo F admitted with Afib.  CHADS2 score =4.  Patient has been started on anticoagulation.  Initial heparin level above goal range.  INR at baseline. No bleeding noted.   Goal of Therapy:  Heparin level 0.3-0.7 units/ml Monitor platelets by anticoagulation protocol: Yes INR 2-3   Plan:  Decrease heparin to 750 units/hr Recheck 8hr heparin level Daily heparin level & CBC while on heparin Coumadin 5mg  po x1  Daily INR Initiate Coumadin education  Biagio Borg 08/15/2013,11:09 AM

## 2013-08-15 NOTE — Progress Notes (Signed)
UR chart review completed.  

## 2013-08-15 NOTE — Progress Notes (Signed)
Barbara [name], Barbara [name]               ACCOUNT NO.:  [id number]  MEDICAL RECORD NO.:  [id number]  LOCATION:  IC05                          FACILITY:  [facility]  PHYSICIAN:  Paula Compton. Willey Blade, MD       DATE OF BIRTH:  10/11/1940  DATE OF PROCEDURE:  08/15/2013 DATE OF DISCHARGE:                                PROGRESS NOTE   SUBJECTIVE:  Ms. Burnett was admitted last night after presenting with palpitations and chest pain.  She was found to be in rapid atrial fibrillation which has responded well to IV diltiazem.  She has been treated with IV heparin drip as well.  She has no chest pain now.  OBJECTIVE:  VITAL SIGNS:  Temperature 97.9, pulse 60, blood pressure 101/54. LUNGS:  Clear. HEART:  Irregularly irregular. ABDOMEN:  Soft and nontender. EXTREMITIES:  No edema. NEURO:  Alert, oriented and in no distress.  IMPRESSION/PLAN: 1. Paroxysmal atrial fibrillation.  Continue IV diltiazem and heparin.     Consult Cardiology.  She had an appointment with Cardiology already     set for today. 2. Coronary artery disease and chest pain.  Three troponins are normal     at less than 0.30. 3. Diabetes.  Glucose 59 by Accu-Chek this morning.  We will modify     her long-acting insulin to Lantus while she is in the hospital.     She has been using Humulin N for years and has been resistant to     transitioning this to Lantus or Levemir.  Her glucose at the lab     earlier this morning was 68.     Paula Compton. Willey Blade, MD     ROF/MEDQ  D:  08/15/2013  T:  08/15/2013  Job:  [id number]

## 2013-08-15 NOTE — Consult Note (Signed)
Reason for Consult:chest pain & atrial fibrillation Referring Physician: Dr. Malachi Paradise Barbara [name] is an 73 y.o. female.  HPI: This is a 73 y.o. Female patient scheduled to see Dr. Harl Bowie in the office today(former patient of Dr. Lattie Haw). Yesterday about 4:00 while sitting in a recliner she developed rapid palpitations and chest tightness radiating into her neck, jaw, and arms. In the ER she was found to be in rapid atrial fibrillations treated with IV heparin and Cardizem. Troponins negative so far. She complains of 1 month history of palpitations occasionally accompanied by chest tightness. Activity makes it worse.   She has a history of nonobstructive CAD cath 2001(see below), negative stress nuclear and normal echo 2011 [facility]. She has long history of palpitations, but never documented Atrial fibrillation. She smokes 1/2 pack cigarettes/day with no intentions of quiting. She has HTN, DM, Hx CVA '93 & '08, hyperlipidemia.    Past Medical History  Diagnosis Date  . Hypertension   . Hyperlipidemia     total cholesterol of 271 and LDL of 187 in 07/2010  . 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 Macular degeneration   . Diabetes mellitus     A1c of 7.6 in 04/2010; moderate to high dose insulin  . Tobacco abuse   . Asthma     Mild  . Palpitations   . Chest pain   . GERD (gastroesophageal reflux disease)   . Stroke 1993 2008    short term memory loss  . Chronic back pain     Past Surgical History  Procedure Laterality Date  . Cataract extraction      left  . Appendectomy    . Tubal ligation    . Colonoscopy  2009  . Esophagogastroduodenoscopy  10/21/2011    Procedure: ESOPHAGOGASTRODUODENOSCOPY (EGD);  Surgeon: Rogene Houston, MD;  Location: [facility];  Service: Endoscopy;   Laterality: N/A;  1200  . Colonoscopy  03/30/2012    Procedure: COLONOSCOPY;  Surgeon: Rogene Houston, MD;  Location: [facility];  Service: Endoscopy;  Laterality: N/A;  730    History reviewed. No pertinent family history.  Social History:  reports that she has quit smoking. Her smoking use included Cigarettes. She has a 7.5 pack-year smoking history. She has never used smokeless tobacco. She reports that she does not drink alcohol or use illicit drugs.  Allergies:  Allergies  Allergen Reactions  . Bee Venom Anaphylaxis  . Sulfonamide Derivatives Nausea And Vomiting  . Penicillins Itching and Rash    Medications:  Scheduled Meds: . aspirin EC  325 mg Oral Daily  . cholecalciferol  1,000 Units Oral QHS  . insulin aspart  0-5 Units Subcutaneous QHS  . insulin aspart  0-9 Units Subcutaneous TID WC  . insulin glargine  25 Units Subcutaneous Daily  . lisinopril  40 mg Oral QHS  . metoprolol succinate  25 mg Oral Daily  . multivitamin-lutein  1 capsule Oral BID  . nitroGLYCERIN  0.5 inch Topical 4 times per day  . pantoprazole  40 mg Oral BID AC  . polyvinyl alcohol  2 drop Both Eyes BID  . sodium chloride  3 mL Intravenous Q12H  . vitamin C  1,000 mg Oral QHS  . vitamin E  1,000 Units Oral QHS   Continuous Infusions: . diltiazem (CARDIZEM) infusion  5 mg/hr (08/15/13 0600)  . heparin 900 Units/hr (08/15/13 0600)   PRN Meds:.sodium chloride, acetaminophen, acetaminophen, albuterol, alum & mag hydroxide-simeth, HYDROmorphone (DILAUDID) injection, nitroGLYCERIN, ondansetron (ZOFRAN) IV, ondansetron, oxyCODONE, sodium chloride   Results for orders placed during the hospital encounter of 08/14/13 (from the past 48 hour(s))  CBC     Status: None   Collection Time    08/14/13 10:58 PM      Result Value Ref Range   WBC 9.3  4.0 - 10.5 K/uL   RBC 4.25  3.87 - 5.11 MIL/uL   Hemoglobin 12.8  12.0 - 15.0 g/dL   HCT 38.4  36.0 - 46.0 %   MCV 90.4  78.0 - 100.0 fL   MCH 30.1  26.0  - 34.0 pg   MCHC 33.3  30.0 - 36.0 g/dL   RDW 13.1  11.5 - 15.5 %   Platelets 237  150 - 400 K/uL  COMPREHENSIVE METABOLIC PANEL     Status: Abnormal   Collection Time    08/14/13 10:58 PM      Result Value Ref Range   Sodium 134 (*) 137 - 147 mEq/L   Potassium 4.8  3.7 - 5.3 mEq/L   Chloride 98  96 - 112 mEq/L   CO2 24  19 - 32 mEq/L   Glucose, Bld 185 (*) 70 - 99 mg/dL   BUN 20  6 - 23 mg/dL   Creatinine, Ser 0.83  0.50 - 1.10 mg/dL   Calcium 9.4  8.4 - 10.5 mg/dL   Total Protein 7.2  6.0 - 8.3 g/dL   Albumin 3.7  3.5 - 5.2 g/dL   AST 19  0 - 37 U/L   ALT 15  0 - 35 U/L   Alkaline Phosphatase 95  39 - 117 U/L   Total Bilirubin <0.2 (*) 0.3 - 1.2 mg/dL   GFR calc non Af Amer 70 (*) >90 mL/min   GFR calc Af Amer 81 (*) >90 mL/min   Comment: (NOTE)     The eGFR has been calculated using the CKD EPI equation.     This calculation has not been validated in all clinical situations.     eGFR's persistently <90 mL/min signify possible Chronic Kidney     Disease.  TROPONIN I     Status: None   Collection Time    08/14/13 10:58 PM      Result Value Ref Range   Troponin I <0.30  <0.30 ng/mL   Comment:            Due to the release kinetics of cTnI,     a negative result within the first hours     of the onset of symptoms does not rule out     myocardial infarction with certainty.     If myocardial infarction is still suspected,     repeat the test at appropriate intervals.  TROPONIN I     Status: None   Collection Time    08/15/13  1:34 AM      Result Value Ref Range   Troponin I <0.30  <0.30 ng/mL   Comment:            Due to the release kinetics of cTnI,     a negative result within the first hours     of the onset of symptoms does not rule out     myocardial infarction with certainty.     If myocardial infarction is still suspected,  repeat the test at appropriate intervals.  MRSA PCR SCREENING     Status: None   Collection Time    08/15/13  2:00 AM      Result  Value Ref Range   MRSA by PCR NEGATIVE  NEGATIVE   Comment:            The GeneXpert MRSA Assay (FDA     approved for NASAL specimens     only), is one component of a     comprehensive MRSA colonization     surveillance program. It is not     intended to diagnose MRSA     infection nor to guide or     monitor treatment for     MRSA infections.  BASIC METABOLIC PANEL     Status: Abnormal   Collection Time    08/15/13  4:44 AM      Result Value Ref Range   Sodium 141  137 - 147 mEq/L   Comment: DELTA CHECK NOTED   Potassium 4.7  3.7 - 5.3 mEq/L   Chloride 106  96 - 112 mEq/L   CO2 25  19 - 32 mEq/L   Glucose, Bld 124 (*) 70 - 99 mg/dL   BUN 17  6 - 23 mg/dL   Creatinine, Ser 0.81  0.50 - 1.10 mg/dL   Calcium 8.5  8.4 - 10.5 mg/dL   GFR calc non Af Amer 72 (*) >90 mL/min   GFR calc Af Amer 83 (*) >90 mL/min   Comment: (NOTE)     The eGFR has been calculated using the CKD EPI equation.     This calculation has not been validated in all clinical situations.     eGFR's persistently <90 mL/min signify possible Chronic Kidney     Disease.  CBC     Status: Abnormal   Collection Time    08/15/13  4:44 AM      Result Value Ref Range   WBC 8.6  4.0 - 10.5 K/uL   RBC 3.92  3.87 - 5.11 MIL/uL   Hemoglobin 11.8 (*) 12.0 - 15.0 g/dL   HCT 36.0  36.0 - 46.0 %   MCV 91.8  78.0 - 100.0 fL   MCH 30.1  26.0 - 34.0 pg   MCHC 32.8  30.0 - 36.0 g/dL   RDW 13.2  11.5 - 15.5 %   Platelets 210  150 - 400 K/uL  PROTIME-INR     Status: None   Collection Time    08/15/13  4:44 AM      Result Value Ref Range   Prothrombin Time 14.4  11.6 - 15.2 seconds   INR 1.14  0.00 - 1.49  TROPONIN I     Status: None   Collection Time    08/15/13  7:27 AM      Result Value Ref Range   Troponin I <0.30  <0.30 ng/mL   Comment:            Due to the release kinetics of cTnI,     a negative result within the first hours     of the onset of symptoms does not rule out     myocardial infarction with  certainty.     If myocardial infarction is still suspected,     repeat the test at appropriate intervals.  GLUCOSE, CAPILLARY     Status: Abnormal   Collection Time    08/15/13  8:03 AM      Result  Value Ref Range   Glucose-Capillary 68 (*) 70 - 99 mg/dL    Dg Chest Portable 1 View  08/14/2013   CLINICAL DATA:  Chest pain.  EXAM: PORTABLE CHEST - 1 VIEW  COMPARISON:  None available for comparison at time of study interpretation.  FINDINGS: The cardiac silhouette appears mildly enlarged, mediastinal silhouette is nonsuspicious, mildly calcified aortic knob. Mild interstitial prominence without pleural effusions or focal consolidations. No pneumothorax.  Multiple EKG lines overlie the patient and may obscure subtle underlying pathology. Soft tissue planes and included osseous structures are nonsuspicious.  IMPRESSION: Mild cardiomegaly and apparent COPD without superimposed acute cardiopulmonary process.   Electronically Signed   By: Elon Alas   On: 08/14/2013 23:41    ROS See HPI Eyes: Negative Ears:Negative for hearing loss, tinnitus Cardiovascular: Positive for chest pain, palpitations,irregular heartbeat, dyspnea, dyspnea on exertion, no near-syncope, orthopnea, paroxysmal nocturnal dyspnea and syncope,edema, claudication, cyanosis,.  Respiratory:  positive for cough,shortness of breath,no sleep disturbances due to breathing, sputum production and wheezing.   Endocrine: Negative for cold intolerance and heat intolerance.  Hematologic/Lymphatic: Negative for adenopathy and bleeding problem. Does not bruise/bleed easily.  Musculoskeletal: chronic back pain.   Gastrointestinal: Negative for nausea, vomiting, reflux, abdominal pain, diarrhea, constipation.   Genitourinary: Negative for bladder incontinence, dysuria, flank pain, frequency, hematuria, hesitancy, nocturia and urgency.  Neurological: Negative.  Allergic/Immunologic: Negative for environmental allergies.  Blood pressure  101/54, pulse 60, temperature 97.9 F (36.6 C), temperature source Oral, resp. rate 14, height _0  (1.626 m), weight 150 lb (68.04 kg), SpO2 94.00%. Physical Exam PHYSICAL EXAM: Well-nournished, in no acute distress. Neck: No JVD, HJR, Bruit, or thyroid enlargement Lungs: No tachypnea, clear without wheezing, rales, or rhonchi Cardiovascular: RRR, with some skipping, 2/6 systolic murmur LSB no gallops, bruit, thrill, or heave. Abdomen: BS normal. Soft without organomegaly, masses, lesions or tenderness. Extremities: without cyanosis, clubbing or edema. Good distal pulses bilateral SKin: Warm, no lesions or rashes  Musculoskeletal: No deformities Neuro: no focal signs  EKG: Atrial fibrillation with controlled rate. Nonspecific ST changes.  Assessment/Plan:  Atrial fibrillation with rapid ventricular rate: Currently NSR, but still going in/out. Rated controlled on IV Cardizem & on IV heparin.Also on Toprol XL 3m. CHADS2 score is 4 so will need anticoagulation.  Chest pain worrisome for ischemia but Troponins negative. Nonobstructive CAD on cath 2001, negative stress myoview 2011, but multiple risk factors CAD. Will need evaluated with stress test.  HTN: controlled  DM  Tobacco Abuse: counselling given, but patient not willing to quit at this time.  Hx CVA 1993 & 2008   Hyperlipidemia: takes Zetia on occasion, but stopped Pravachol because it causes her legs to hurt.No recent lipid profile. Will order.  MErmalinda Barrios3/25/2015, 9:09 AM

## 2013-08-15 NOTE — H&P (Signed)
[facility] History and Physical  Barbara [name] CWC:[id number] DOB: 1940/07/27 DOA: 08/14/2013  Referring physician: EDP PCP: Asencion Noble, MD  Specialists:   Chief Complaint: Chest Pian and Palpitations  HPI: Barbara [name] is a 73 y.o. female with a history of CAD, HTN and DM2 who resented to the ED with complaints of worsening of her chest pain and palpitations since 8:30 pm.  She reports that she has had chest pain and palpitations off and on for months, but this evening she reports the pain was in her Substernal chest that radiated into her jaw and down both arms.  She had associated SOB, dizziness, and diaphoresis.   She wasevaluated in the ED and found to be in Atrial fibrillation with RVR and was started on a Cardizem drip.   She reports that she was to see her cardiologist  Dr. Harl Bowie in the AM on 03/25.    The first troponin was negative.    Review of Systems:  Constitutional: No Weight Loss, No Weight Gain, Night Sweats, Fevers, Chills, Fatigue, or Generalized Weakness HEENT: No Headaches, Difficulty Swallowing,Tooth/Dental Problems,Sore Throat,  No Sneezing, Rhinitis, Ear Ache, Nasal Congestion, or Post Nasal Drip,  Cardio-vascular:  +Chest pain, Orthopnea, PND, Edema in lower extremities, Anasarca, +Dizziness, +Palpitations  Resp: +Dyspnea, No DOE, No Productive Cough, No Non-Productive Cough, No Hemoptysis, No Change in Color of Mucus,  No Wheezing.    GI: No Heartburn, Indigestion, Abdominal Pain, Nausea, Vomiting, Diarrhea, Change in Bowel Habits,  Loss of Appetite  GU: No Dysuria, Change in Color of Urine, No Urgency or Frequency.  No flank pain.  Musculoskeletal: No Joint Pain or Swelling.  No Decreased Range of Motion. No Back Pain.  Neurologic: No Syncope, No Seizures, Muscle Weakness, Paresthesia, Vision Disturbance or Loss, No Diplopia, No Vertigo, No Difficulty Walking,  Skin: No Rash or Lesions. Psych: No Change in Mood or Affect. No Depression or Anxiety. No Memory  loss. No Confusion or Hallucinations   Past Medical History  Diagnosis Date  . Hypertension   . Hyperlipidemia     total cholesterol of 271 and LDL of 187 in 07/2010  . 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 Macular degeneration   . Diabetes mellitus     A1c of 7.6 in 04/2010; moderate to high dose insulin  . Tobacco abuse   . Asthma     Mild  . Palpitations   . Chest pain   . GERD (gastroesophageal reflux disease)   . Stroke 1993 2008    short term memory loss  . Chronic back pain       Past Surgical History  Procedure Laterality Date  . Cataract extraction      left  . Appendectomy    . Tubal ligation    . Colonoscopy  2009  . Esophagogastroduodenoscopy  10/21/2011    Procedure: ESOPHAGOGASTRODUODENOSCOPY (EGD);  Surgeon: Rogene Houston, MD;  Location: [facility];  Service: Endoscopy;  Laterality: N/A;  1200  . Colonoscopy  03/30/2012    Procedure: COLONOSCOPY;  Surgeon: Rogene Houston, MD;  Location: [facility];  Service: Endoscopy;  Laterality: N/A;  730       Prior to Admission medications   Medication Sig Start Date End Date Taking? Authorizing Provider  albuterol (PROVENTIL HFA;VENTOLIN HFA) 108 (90 BASE) MCG/ACT inhaler Inhale  2 puffs into the lungs every 4 (four) hours as needed. FOR SHORTNESS OF BREATH    Historical Provider, MD  Bevacizumab (AVASTIN) 100 MG/4ML SOLN Inject 1.25 mg into the vein every 8 (eight) weeks.    Historical Provider, MD  BIOTIN 5000 PO Take 1 tablet by mouth daily.    Historical Provider, MD  Calcium Carbonate Antacid (TUMS E-X SUGAR FREE PO) Take 2 tablets by mouth every morning.    Historical Provider, MD  Cholecalciferol (VITAMIN D) 1000 UNITS capsule Take 1,000 Units by mouth at bedtime.     Historical Provider, MD   HYDROcodone-acetaminophen (VICODIN) 5-500 MG per tablet Take 1 tablet by mouth every 4 (four) hours as needed. FOR PAIN    Historical Provider, MD  insulin NPH (HUMULIN N,NOVOLIN N) 100 UNIT/ML injection Inject 38 Units into the skin daily.    Historical Provider, MD  insulin regular (NOVOLIN R,HUMULIN R) 100 units/mL injection Inject 2-10 Units into the skin 3 (three) times daily before meals. SLIDING SCALE    Historical Provider, MD  lisinopril (PRINIVIL,ZESTRIL) 40 MG tablet Take 40 mg by mouth at bedtime.     Historical Provider, MD  metoprolol succinate (TOPROL-XL) 25 MG 24 hr tablet Take 1 tablet (25 mg total) by mouth daily. 10/20/12   Yehuda Savannah, MD  Multiple Vitamins-Minerals (ICAPS PO) Take 1 capsule by mouth 2 (two) times daily.     Historical Provider, MD  nitroGLYCERIN (NITROSTAT) 0.4 MG SL tablet Place 0.4 mg under the tongue every 5 (five) minutes x 3 doses as needed. 06/02/11 06/01/12  Yehuda Savannah, MD  nystatin-triamcinolone ointment [facility]) Apply topically 2 (two) times daily. Applied to the anal canal twice daily for 10 days and thereafter on as needed basis 03/30/12   Rogene Houston, MD  pantoprazole (PROTONIX) 40 MG tablet Take 1 tablet (40 mg total) by mouth 2 (two) times daily before a meal. 10/21/11 10/20/12  Rogene Houston, MD  Polyethyl Glycol-Propyl Glycol (SYSTANE OP) Apply 1 drop to eye 2 (two) times daily.    Historical Provider, MD  vitamin C (ASCORBIC ACID) 500 MG tablet Take 1,000 mg by mouth at bedtime.     Historical Provider, MD  vitamin E 1000 UNIT capsule Take 1,000 Units by mouth at bedtime.     Historical Provider, MD  zinc gluconate 50 MG tablet Take 50 mg by mouth at bedtime.    Historical Provider, MD      Allergies  Allergen Reactions  . Bee Venom Anaphylaxis  . Sulfonamide Derivatives Nausea And Vomiting  . Penicillins Itching and Rash     Social History:  reports that she has quit smoking. Her smoking use included Cigarettes. She has a  7.5 pack-year smoking history. She has never used smokeless tobacco. She reports that she does not drink alcohol or use illicit drugs.     Family History:     CAD in Mother and in 2 Brothers  SVT in Daughter and GrandDaughter    Physical Exam:  GEN:  Pleasant Elderly Thin well developed 73 y.o. female examined  and in no acute distress; cooperative with exam Filed Vitals:   08/14/13 2241 08/14/13 2325 08/14/13 2345 08/14/13 2351  BP: 114/90 138/91 120/88   Pulse: 53 88 92 103  Temp: 97.9 F (36.6 C)     TempSrc: Oral     Resp: 20 18 14 17   Height: 5\' 4"  (1.626 m)     Weight: 68.04 kg (150 lb)  SpO2: 98% 97% 97% 96%   Blood pressure 120/88, pulse 103, temperature 97.9 F (36.6 C), temperature source Oral, resp. rate 17, height 5\' 4"  (1.626 m), weight 68.04 kg (150 lb), SpO2 96.00%. PSYCH: She is alert and oriented x4; does not appear anxious does not appear depressed; affect is normal HEENT: Normocephalic and Atraumatic, Mucous membranes pink; PERRLA; EOM intact; Fundi:  Benign;  No scleral icterus, Nares: Patent, Oropharynx: Clear,  Fair Dentition, Neck:  FROM, no cervical lymphadenopathy nor thyromegaly or carotid bruit; no JVD; Breasts:: Not examined CHEST WALL: No tenderness CHEST: Normal respiration, clear to auscultation bilaterally HEART: Regular rate and rhythm; no murmurs rubs or gallops BACK: No kyphosis or scoliosis; no CVA tenderness ABDOMEN: Positive Bowel Sounds, soft non-tender; no masses, no organomegaly Rectal Exam: Not done EXTREMITIES: No cyanosis, clubbing or edema; no ulcerations. Genitalia: not examined PULSES: 2+ and symmetric SKIN: Normal hydration no rash or ulceration CNS:   Vascular: pulses palpable throughout    Labs on Admission:  Basic Metabolic Panel:  Recent Labs Lab 08/14/13 2258  NA 134*  K 4.8  CL 98  CO2 24  GLUCOSE 185*  BUN 20  CREATININE 0.83  CALCIUM 9.4   Liver Function Tests:  Recent Labs Lab 08/14/13 2258  AST  19  ALT 15  ALKPHOS 95  BILITOT <0.2*  PROT 7.2  ALBUMIN 3.7   No results found for this basename: LIPASE, AMYLASE,  in the last 168 hours No results found for this basename: AMMONIA,  in the last 168 hours CBC:  Recent Labs Lab 08/14/13 2258  WBC 9.3  HGB 12.8  HCT 38.4  MCV 90.4  PLT 237   Cardiac Enzymes:  Recent Labs Lab 08/14/13 2258  TROPONINI <0.30    BNP (last 3 results) No results found for this basename: PROBNP,  in the last 8760 hours CBG: No results found for this basename: GLUCAP,  in the last 168 hours  Radiological Exams on Admission: Dg Chest Portable 1 View  08/14/2013   CLINICAL DATA:  Chest pain.  EXAM: PORTABLE CHEST - 1 VIEW  COMPARISON:  None available for comparison at time of study interpretation.  FINDINGS: The cardiac silhouette appears mildly enlarged, mediastinal silhouette is nonsuspicious, mildly calcified aortic knob. Mild interstitial prominence without pleural effusions or focal consolidations. No pneumothorax.  Multiple EKG lines overlie the patient and may obscure subtle underlying pathology. Soft tissue planes and included osseous structures are nonsuspicious.  IMPRESSION: Mild cardiomegaly and apparent COPD without superimposed acute cardiopulmonary process.   Electronically Signed   By: Elon Alas   On: 08/14/2013 23:41      EKG: Independently reviewed.Atrial Fibrillation at Rate 123     Assessment/Plan:   73 y.o. female with  Principal Problem:   Atrial fibrillation with RVR Active Problems:   Chest pain   Hypertension   Hyperlipidemia   Diabetes mellitus   Arteriosclerotic cardiovascular disease (ASCVD)   Tobacco abuse   Asthma    1.  Atrial Fibrillation with RVR-    IV Cardizem Drip, and IV Heparin Drip, cycle troponins.   Cardiac monitoring.     2.   Chest Pain- Cardiac Moniotring,  Cycle Troponins, ASA, and O2,RX,  Nitropaste if BP tolerates.    3.    HTN-   Continue  Metoprolol, and Lisinopril Rx.    Monitor BPs.     4.    Hyperlipidemia- check lipids.    5.    DM2 on Insulinrx-  Continue Insulin  Rx, adn Add SSI coverage and check HbA1C.    6.    CAD/ASCVD-   See #2, and #4.    7.     Tobacco Abuse -  Decreasing, Counseled re: Cessation, Nicotine patch while inpatient.       Code Status:   FULL CODE Family Communication:    Family at Bedside Disposition Plan:       Inpatient to Stepdown  Time spent:  [facility] Pager [phone]  If 7PM-7AM, please contact night-coverage [email or web address] Password [email or web address] 08/15/2013, 1:22 AM

## 2013-08-15 NOTE — Progress Notes (Signed)
*  PRELIMINARY RESULTS* Echocardiogram 2D Echocardiogram has been performed.  [place], [place] 08/15/2013, 4:31 PM

## 2013-08-16 ENCOUNTER — Telehealth: Payer: Self-pay

## 2013-08-16 DIAGNOSIS — Z7901 Long term (current) use of anticoagulants: Secondary | ICD-10-CM

## 2013-08-16 DIAGNOSIS — I4891 Unspecified atrial fibrillation: Secondary | ICD-10-CM

## 2013-08-16 DIAGNOSIS — Z5181 Encounter for therapeutic drug level monitoring: Secondary | ICD-10-CM

## 2013-08-16 LAB — GLUCOSE, CAPILLARY
GLUCOSE-CAPILLARY: 65 mg/dL — AB (ref 70–99)
Glucose-Capillary: 140 mg/dL — ABNORMAL HIGH (ref 70–99)

## 2013-08-16 LAB — LIPID PANEL
CHOLESTEROL: 231 mg/dL — AB (ref 0–200)
HDL: 42 mg/dL (ref >39)
LDL Cholesterol: 158 mg/dL — ABNORMAL HIGH (ref 0–99)
Total CHOL/HDL Ratio: 5.5 RATIO
Triglycerides: 155 mg/dL — ABNORMAL HIGH (ref <150)
VLDL: 31 mg/dL (ref 0–40)

## 2013-08-16 LAB — CBC
HCT: 39.2 % (ref 36.0–46.0)
HEMOGLOBIN: 12.7 g/dL (ref 12.0–15.0)
MCH: 29.6 pg (ref 26.0–34.0)
MCHC: 32.4 g/dL (ref 30.0–36.0)
MCV: 91.4 fL (ref 78.0–100.0)
Platelets: 213 10*3/uL (ref 150–400)
RBC: 4.29 MIL/uL (ref 3.87–5.11)
RDW: 13.2 % (ref 11.5–15.5)
WBC: 7.1 10*3/uL (ref 4.0–10.5)

## 2013-08-16 LAB — PROTIME-INR
INR: 0.99 (ref 0.00–1.49)
Prothrombin Time: 12.9 seconds (ref 11.6–15.2)

## 2013-08-16 LAB — HEPARIN LEVEL (UNFRACTIONATED): HEPARIN UNFRACTIONATED: 0.34 [IU]/mL (ref 0.30–0.70)

## 2013-08-16 MED ORDER — RIVAROXABAN 20 MG PO TABS: 20.0000 mg | ORAL_TABLET | Freq: Every day | ORAL | Status: DC

## 2013-08-16 MED ORDER — LISINOPRIL 10 MG PO TABS: 10.0000 mg | ORAL_TABLET | Freq: Every day | ORAL | Status: DC

## 2013-08-16 MED ORDER — INSULIN GLARGINE 100 UNIT/ML ~~LOC~~ SOLN: 25.0000 [IU] | Freq: Every day | SUBCUTANEOUS | Status: DC

## 2013-08-16 MED ORDER — DILTIAZEM HCL 60 MG PO TABS: 60.0000 mg | ORAL_TABLET | Freq: Two times a day (BID) | ORAL | Status: DC

## 2013-08-16 NOTE — Progress Notes (Signed)
SUBJECTIVE: Pt feeling very well this morning and denies chest pain, shortness of breath, and jaw pain. She is still weighing the differences between warfarin and a target-specific oral anticoagulant, and goes back and forth.     Intake/Output Summary (Last 24 hours) at 08/16/13 0904 Last data filed at 08/16/13 0600  Gross per 24 hour  Intake 668.85 ml  Output   2200 ml  Net -1531.15 ml    Current Facility-Administered Medications  Medication Dose Route Frequency Provider Last Rate Last Dose  . 0.9 %  sodium chloride infusion  250 mL Intravenous PRN Theressa Millard, MD      . acetaminophen (TYLENOL) tablet 650 mg  650 mg Oral Q6H PRN Theressa Millard, MD       Or  . acetaminophen (TYLENOL) suppository 650 mg  650 mg Rectal Q6H PRN Theressa Millard, MD      . albuterol (PROVENTIL) (2.5 MG/3ML) 0.083% nebulizer solution 2.5 mg  2.5 mg Inhalation Q4H PRN Theressa Millard, MD      . alum & mag hydroxide-simeth (MAALOX/MYLANTA) 200-200-20 MG/5ML suspension 30 mL  30 mL Oral Q6H PRN Theressa Millard, MD      . cholecalciferol (VITAMIN D) tablet 1,000 Units  1,000 Units Oral QHS Theressa Millard, MD   1,000 Units at 08/15/13 2126  . diltiazem (CARDIZEM) tablet 60 mg  60 mg Oral Q12H Herminio Commons, MD      . HYDROmorphone (DILAUDID) injection 0.5-1 mg  0.5-1 mg Intravenous Q3H PRN Theressa Millard, MD      . insulin aspart (novoLOG) injection 0-5 Units  0-5 Units Subcutaneous QHS Asencion Noble, MD      . insulin aspart (novoLOG) injection 0-9 Units  0-9 Units Subcutaneous TID WC Asencion Noble, MD   2 Units at 08/15/13 1154  . insulin glargine (LANTUS) injection 25 Units  25 Units Subcutaneous Daily Asencion Noble, MD   25 Units at 08/15/13 1006  . lisinopril (PRINIVIL,ZESTRIL) tablet 20 mg  20 mg Oral QHS Herminio Commons, MD      . metoprolol succinate (TOPROL-XL) 24 hr tablet 25 mg  25 mg Oral Daily Theressa Millard, MD   25 mg at 08/15/13 1007  . multivitamin-lutein  (OCUVITE-LUTEIN) capsule 1 capsule  1 capsule Oral BID Theressa Millard, MD   1 capsule at 08/15/13 2126  . nitroGLYCERIN (NITROSTAT) SL tablet 0.4 mg  0.4 mg Sublingual Q5 Min x 3 PRN Theressa Millard, MD      . ondansetron (ZOFRAN) tablet 4 mg  4 mg Oral Q6H PRN Theressa Millard, MD       Or  . ondansetron (ZOFRAN) injection 4 mg  4 mg Intravenous Q6H PRN Theressa Millard, MD      . oxyCODONE (Oxy IR/ROXICODONE) immediate release tablet 5 mg  5 mg Oral Q4H PRN Theressa Millard, MD   5 mg at 08/15/13 2129  . pantoprazole (PROTONIX) EC tablet 40 mg  40 mg Oral BID AC Theressa Millard, MD   40 mg at 08/16/13 0753  . polyvinyl alcohol (LIQUIFILM TEARS) 1.4 % ophthalmic solution 2 drop  2 drop Both Eyes BID Theressa Millard, MD   2 drop at 08/15/13 2129  . Rivaroxaban (XARELTO) tablet 20 mg  20 mg Oral Q supper Herminio Commons, MD      . sodium chloride 0.9 % injection 3 mL  3 mL Intravenous Q12H Harvette C  Arnoldo Morale, MD   3 mL at 08/15/13 2126  . sodium chloride 0.9 % injection 3 mL  3 mL Intravenous PRN Theressa Millard, MD      . vitamin C (ASCORBIC ACID) tablet 1,000 mg  1,000 mg Oral QHS Theressa Millard, MD   1,000 mg at 08/15/13 2126  . vitamin E capsule 1,000 Units  1,000 Units Oral QHS Theressa Millard, MD   1,000 Units at 08/15/13 2130    Filed Vitals:   08/16/13 0200 08/16/13 0300 08/16/13 0400 08/16/13 0500  BP: 80/59 88/46 87/55  103/69  Pulse: 40 30 26 61  Temp:   98.2 F (36.8 C)   TempSrc:   Oral   Resp: 20 15 12 19   Height:      Weight:    150 lb 2.1 oz (68.1 kg)  SpO2: 92% 93% 92% 98%    PHYSICAL EXAM General: NAD Neck: No JVD, no thyromegaly.  Lungs: Clear to auscultation bilaterally with normal respiratory effort. CV: Nondisplaced PMI.  Regular rate and rhythm, normal S1/S2, no S3/S4, no murmur.  No pretibial edema.  No carotid bruit.  Normal pedal pulses.  Abdomen: Soft, nontender, no hepatosplenomegaly, no distention.  Neurologic: Alert and  oriented x 3.  Psych: Normal affect. Extremities: No clubbing or cyanosis.   TELEMETRY: Reviewed telemetry pt in sinus rhythm with occasional atrial ectopy and atrial fibrillation.  LABS: Basic Metabolic Panel:  Recent Labs  08/14/13 2258 08/15/13 0444  NA 134* 141  K 4.8 4.7  CL 98 106  CO2 24 25  GLUCOSE 185* 124*  BUN 20 17  CREATININE 0.83 0.81  CALCIUM 9.4 8.5   Liver Function Tests:  Recent Labs  08/14/13 2258  AST 19  ALT 15  ALKPHOS 95  BILITOT <0.2*  PROT 7.2  ALBUMIN 3.7   No results found for this basename: LIPASE, AMYLASE,  in the last 72 hours CBC:  Recent Labs  08/15/13 0444 08/16/13 0412  WBC 8.6 7.1  HGB 11.8* 12.7  HCT 36.0 39.2  MCV 91.8 91.4  PLT 210 213   Cardiac Enzymes:  Recent Labs  08/15/13 0134 08/15/13 0727 08/15/13 1552  TROPONINI <0.30 <0.30 <0.30   BNP: No components found with this basename: POCBNP,  D-Dimer: No results found for this basename: DDIMER,  in the last 72 hours Hemoglobin A1C: No results found for this basename: HGBA1C,  in the last 72 hours Fasting Lipid Panel:  Recent Labs  08/16/13 0412  CHOL 231*  HDL 42  LDLCALC 158*  TRIG 155*  CHOLHDL 5.5   Thyroid Function Tests: No results found for this basename: TSH, T4TOTAL, FREET3, T3FREE, THYROIDAB,  in the last 72 hours Anemia Panel: No results found for this basename: VITAMINB12, FOLATE, FERRITIN, TIBC, IRON, RETICCTPCT,  in the last 72 hours  RADIOLOGY: Dg Chest Portable 1 View  08/14/2013   CLINICAL DATA:  Chest pain.  EXAM: PORTABLE CHEST - 1 VIEW  COMPARISON:  None available for comparison at time of study interpretation.  FINDINGS: The cardiac silhouette appears mildly enlarged, mediastinal silhouette is nonsuspicious, mildly calcified aortic knob. Mild interstitial prominence without pleural effusions or focal consolidations. No pneumothorax.  Multiple EKG lines overlie the patient and may obscure subtle underlying pathology. Soft tissue  planes and included osseous structures are nonsuspicious.  IMPRESSION: Mild cardiomegaly and apparent COPD without superimposed acute cardiopulmonary process.   Electronically Signed   By: Elon Alas   On: 08/14/2013 23:41  ASSESSMENT AND PLAN: She is currently going in and out of atrial fibrillation, but much less so than before and her rate has been well controlled on diltiazem 30 mg q 6 hours, which I will switch to 60 mg bid. We had a lengthy discussion as the patient keeps changing her mind about warfarin vs Xarelto, as she saw a commercial that warned against bleeding complications. Ultimately, she has now decided to use Xarelto. I will also d/c both ASA and heparin, and start Xarelto 20 mg qhs. I will continue her on the reduced dose of lisinopril as she is now on oral diltiazem to ensure she does not become hypotensive. Will use SL nitro prn.  Echocardiogram showed normal LV systolic function and only mild mitral regurgitation. She prefers not to be on warfarin longterm due to frequent INR monitoring.  Her CHADS-Vasc score is 6/9, thus giving her a high unadjusted ischemic annual stroke rate and I discussed the importance of anticoagulation with her.  She has several cardiovascular risk factors, and I feel an outpatient nuclear stress test is warranted, which I can help to arrange when I follow up with her in clinic. Prior cath showed nonobstructive CAD.    Kate Sable, M.D., F.A.C.C.

## 2013-08-16 NOTE — Progress Notes (Signed)
Patient discharged to home, with daughter. Patient able to "teach back" information regarding follow-up appointments, activity, diet, and medications, and reasons to seek emergency treatment. Teaching given regarding xarelto, bleeding risk, and signs of bleeding. Patient instructed not stop medication without physician knowledge d/t risk of CVA. Vital signs stable at transfer.

## 2013-08-16 NOTE — Telephone Encounter (Signed)
Per staff message from [place] pt needs lexi cardiolite then post hospital fu with   I have placed the orders and will route to [employer]

## 2013-08-16 NOTE — Discharge Instructions (Signed)
Arrive for stress test at 7:15 am.  Information on my medicine - ELIQUIS (apixaban)  This medication education was reviewed with me or my healthcare representative as part of my discharge preparation.  The pharmacist that spoke with me during my hospital stay was:  Biagio Borg, [facility]  Why was Eliquis prescribed for you? Eliquis was prescribed for you to reduce the risk of a blood clot forming that can cause a stroke if you have a medical condition called atrial fibrillation (a type of irregular heartbeat).  What do You need to know about Eliquis ? Take your Eliquis TWICE DAILY - one tablet in the morning and one tablet in the evening with or without food. If you have difficulty swallowing the tablet whole please discuss with your pharmacist how to take the medication safely.  Take Eliquis exactly as prescribed by your doctor and DO NOT stop taking Eliquis without talking to the doctor who prescribed the medication.  Stopping may increase your risk of developing a stroke.  Refill your prescription before you run out.  After discharge, you should have regular check-up appointments with your healthcare provider that is prescribing your Eliquis.  In the future your dose may need to be changed if your kidney function or weight changes by a significant amount or as you get older.  What do you do if you miss a dose? If you miss a dose, take it as soon as you remember on the same day and resume taking twice daily.  Do not take more than one dose of ELIQUIS at the same time to make up a missed dose.  Important Safety Information A possible side effect of Eliquis is bleeding. You should call your healthcare provider right away if you experience any of the following:   Bleeding from an injury or your nose that does not stop.   Unusual colored urine (red or dark brown) or unusual colored stools (red or black).   Unusual bruising for unknown reasons.   A serious fall or if you hit  your head (even if there is no bleeding).  Some medicines may interact with Eliquis and might increase your risk of bleeding or clotting while on Eliquis. To help avoid this, consult your healthcare provider or pharmacist prior to using any new prescription or non-prescription medications, including herbals, vitamins, non-steroidal anti-inflammatory drugs (NSAIDs) and supplements.  This website has more information on Eliquis (apixaban): [email or web address].

## 2013-08-17 NOTE — Discharge Summary (Signed)
Barbara [name], Barbara [name]               ACCOUNT NO.:  [id number]  MEDICAL RECORD NO.:  [id number]  LOCATION:  IC05                          FACILITY:  [facility]  PHYSICIAN:  Paula Compton. Willey Blade, MD       DATE OF BIRTH:  [birth year]  DATE OF ADMISSION:  08/14/2013 DATE OF DISCHARGE:  03/26/2015LH                              DISCHARGE SUMMARY   DISCHARGE DIAGNOSES: 1. Paroxysmal atrial fibrillation. 2. Coronary artery disease. 3. Type 2 diabetes. 4. Hyperlipidemia. 5. Hypertension.  HOSPITAL COURSE:  This patient is a 73 year old female who was admitted with rapid heart beating and chest pain.  She was found to be in atrial fibrillation with a rapid ventricular response.  She was treated with IV diltiazem.  She was started on IV heparin.  Her echocardiogram revealed a normal ejection fraction with a left ventricular ejection fraction of 60% to 65%.  There were no wall motion abnormalities.  She had mild mitral regurgitation.  She converted to sinus rhythm.  Diltiazem was modified to the oral route.  She was continued on metoprolol.  The decision was made to start anticoagulation with Xarelto, based on her CHADS-VASc score.  She was hyperlipidemic with a cholesterol of 231 with an HDL of 42, LDL of 158, and triglycerides of 155.  She has a history of nonobstructive coronary artery disease.  Troponins were consistently normal at less than 0.30.  Diabetes therapy was modified to Lantus from Humulin N.  She was improved and stable for discharge on the morning of August 16, 2013.  She will have followup in the office in 2 weeks.  A TSH is pending.  DISCHARGE MEDICATIONS: 1. Diltiazem 60 mg b.i.d. 2. Lisinopril 10 mg daily. 3. Xarelto 20 mg daily. 4. Metoprolol 25 mg b.i.d. 5. Avastin 1.25 mg every 8 weeks. 6. Biotin 5000 one tablet daily. 7. Tums E-X 2 tablets daily. 8. Vitamin D 1000 units daily. 9. Vicodin q.4 h. p.r.n. 10.Lantus 25 units daily. 11.Multivitamin daily. 12.Sublingual  nitroglycerin p.r.n. 13.Vitamin C 1000 mg daily. 14.Vitamin E 1000 units daily. 15.Zinc 50 mg daily.     Paula Compton. Willey Blade, MD     ROF/MEDQ  D:  08/17/2013  T:  08/17/2013  Job:  [id number]

## 2013-08-20 ENCOUNTER — Encounter (HOSPITAL_COMMUNITY): Payer: Medicare Other

## 2013-08-20 ENCOUNTER — Inpatient Hospital Stay (HOSPITAL_COMMUNITY): Admit: 2013-08-20 | Payer: Medicare Other

## 2013-08-21 ENCOUNTER — Other Ambulatory Visit: Payer: Self-pay

## 2013-08-21 ENCOUNTER — Telehealth: Payer: Self-pay | Admitting: Cardiovascular Disease

## 2013-08-21 DIAGNOSIS — K922 Gastrointestinal hemorrhage, unspecified: Secondary | ICD-10-CM

## 2013-08-21 LAB — CBC
HCT: 35.9 % — ABNORMAL LOW (ref 36.0–46.0)
Hemoglobin: 12.5 g/dL (ref 12.0–15.0)
MCH: 29.4 pg (ref 26.0–34.0)
MCHC: 34.8 g/dL (ref 30.0–36.0)
MCV: 84.5 fL (ref 78.0–100.0)
Platelets: 255 10*3/uL (ref 150–400)
RBC: 4.25 MIL/uL (ref 3.87–5.11)
RDW: 13.8 % (ref 11.5–15.5)
WBC: 8.7 10*3/uL (ref 4.0–10.5)

## 2013-08-21 NOTE — Telephone Encounter (Signed)
Patient c/o dark stool today (normal stool yesterday) and "raw" inside mouth.Patient is on Xarelto.Denied abdominal pain,hematuria.  Upon advise of K.Lawrence NP patient will hold Xarelto and have cbc and hemoccult stool done

## 2013-08-21 NOTE — Telephone Encounter (Signed)
Patient states that she is having allergic reaction to Xarelto. Please return call/ tgs

## 2013-08-22 ENCOUNTER — Encounter: Payer: Self-pay | Admitting: *Deleted

## 2013-09-04 ENCOUNTER — Encounter: Payer: Self-pay | Admitting: Adult Health

## 2013-09-04 ENCOUNTER — Ambulatory Visit (INDEPENDENT_AMBULATORY_CARE_PROVIDER_SITE_OTHER): Payer: Medicare Other | Admitting: Adult Health

## 2013-09-04 VITALS — BP 132/49 | HR 68 | Ht 64.0 in | Wt 147.0 lb

## 2013-09-04 DIAGNOSIS — I2510 Atherosclerotic heart disease of native coronary artery without angina pectoris: Secondary | ICD-10-CM

## 2013-09-04 DIAGNOSIS — I4891 Unspecified atrial fibrillation: Secondary | ICD-10-CM

## 2013-09-04 DIAGNOSIS — I7090 Unspecified atherosclerosis: Secondary | ICD-10-CM

## 2013-09-04 DIAGNOSIS — I10 Essential (primary) hypertension: Secondary | ICD-10-CM

## 2013-09-04 MED ORDER — DILTIAZEM HCL ER COATED BEADS 120 MG PO CP24: 120.0000 mg | ORAL_CAPSULE | Freq: Every day | ORAL | Status: DC

## 2013-09-04 NOTE — Assessment & Plan Note (Signed)
Blood pressure is well-controlled currently. I have made no changes in her medications. For ease in administration, I have changed her Cardizem 60 mg twice a day 120 mg daily.

## 2013-09-04 NOTE — Assessment & Plan Note (Signed)
She remains in normal sinus rhythm. She continues on Xarelto, along with ties and 60 mg twice a day, and metoprolol 25 mg twice a day. She has had one episode of blood from her rectum which she attributes to hemorrhoids. She said it was brief, and refuses any followup labs. I have advised her she continues to have bleeding from her rectum she needs to call us immediately and stop the Xarelto. She may be having some issues with hemorrhoids, but I am concerned that if the bleeding continues she will have to beats seen by GI or surgery, to take care of the hemorrhoids in order to continue use of Xarelto as I find this to be extremely necessary with a history of paroxysmal atrial fibrillation. As significant as 3 months unless symptomatic.

## 2013-09-04 NOTE — Patient Instructions (Addendum)
Your physician recommends that you schedule a follow-up appointment in: 3 months with Dr Harl Bowie  Your physician has recommended you make the following change in your medication:  Start Diltazem 120 mg daily Stop Diltiazem 60 mg

## 2013-09-04 NOTE — Assessment & Plan Note (Signed)
No recurrent chest pain. She will continue current medication regimen as directed. She is no longer on aspirin

## 2013-09-04 NOTE — Progress Notes (Deleted)
Name: Barbara [name]    DOB: [birth year]  Age: 73 y.o.  MR#: [id number]       PCP:  Carylon Perches, MD      Insurance: Payor: [employer] [place] [employer] / Plan: BLUE MEDICARE / Product Type: *No Product type* /   CC:    Chief Complaint  Patient presents with  . Atrial Fibrillation  . Coronary Artery Disease    VS Filed Vitals:   09/04/13 1312  BP: 132/49  Pulse: 68  Height: 5\' 4"  (1.626 m)  Weight: 147 lb (66.679 kg)    Weights Current Weight  09/04/13 147 lb (66.679 kg)  08/16/13 150 lb 2.1 oz (68.1 kg)  03/30/12 152 lb (68.947 kg)    Blood Pressure  BP Readings from Last 3 Encounters:  09/04/13 132/49  08/16/13 103/69  03/30/12 110/60     Admit date:  (Not on file) Last encounter with RMR:  Visit date not found   Allergy Bee venom; Sulfonamide derivatives; and Penicillins  Current Outpatient Prescriptions  Medication Sig Dispense Refill  . Bevacizumab (AVASTIN) 100 MG/4ML SOLN Inject 1.25 mg into the vein every 8 (eight) weeks.      13/07/13 BIOTIN 5000 PO Take 1 tablet by mouth daily.      . Calcium Carbonate Antacid (TUMS E-X SUGAR FREE PO) Take 2 tablets by mouth every morning.      . Cholecalciferol (VITAMIN D) 1000 UNITS capsule Take 1,000 Units by mouth at bedtime.       Marland Kitchen diltiazem (CARDIZEM) 60 MG tablet Take 1 tablet (60 mg total) by mouth every 12 (twelve) hours.  60 tablet  12  . HYDROcodone-acetaminophen (VICODIN) 5-500 MG per tablet Take 1 tablet by mouth every 4 (four) hours as needed. FOR PAIN      . insulin glargine (LANTUS) 100 UNIT/ML injection Inject 0.25 mLs (25 Units total) into the skin daily.  10 mL  11  . insulin regular (NOVOLIN R,HUMULIN R) 100 units/mL injection Inject 2-10 Units into the skin 3 (three) times daily before meals. SLIDING SCALE      . lisinopril (PRINIVIL,ZESTRIL) 10 MG tablet Take 1 tablet (10 mg total) by mouth at bedtime.  30 tablet  12  . metoprolol tartrate (LOPRESSOR) 25 MG tablet Take 12.5-25 mg by mouth See admin  instructions. Patient takes 25 mg at 0800 and 2000. She also takes 12.5 mg at [facility] if she feels her heart fluttering/racing.      . Multiple Vitamins-Minerals (ICAPS PO) Take 1 capsule by mouth 2 (two) times daily.       . Rivaroxaban (XARELTO) 20 MG TABS tablet Take 1 tablet (20 mg total) by mouth daily with supper.  30 tablet  12  . vitamin C (ASCORBIC ACID) 500 MG tablet Take 1,000 mg by mouth at bedtime.       . vitamin E 1000 UNIT capsule Take 1,000 Units by mouth at bedtime.       [facility] zinc gluconate 50 MG tablet Take 50 mg by mouth at bedtime.      . nitroGLYCERIN (NITROSTAT) 0.4 MG SL tablet Place 0.4 mg under the tongue every 5 (five) minutes x 3 doses as needed.      Marland Kitchen Glycol-Propyl Glycol (SYSTANE OP) Apply 1 drop to eye 2 (two) times daily.       No current facility-administered medications for this visit.    Discontinued Meds:   There are no discontinued medications.  Patient Active Problem List  Diagnosis Date Noted  . Rapid atrial fibrillation 08/15/2013  . Atrial fibrillation with RVR 08/15/2013  . Right upper quadrant pain 11/16/2011  . Abdominal pain, bilateral lower quadrant 09/01/2011  . Cerebrovascular disease 10/09/2010  . Hypertension   . Hyperlipidemia   . Diabetes mellitus   . Arteriosclerotic cardiovascular disease (ASCVD)   . Macular degeneration   . Tobacco abuse   . Asthma   . Palpitations   . Chest pain     LABS    Component Value Date/Time   NA 141 08/15/2013 0444   NA 134* 08/14/2013 2258   NA 138 09/01/2011 1600   K 4.7 08/15/2013 0444   K 4.8 08/14/2013 2258   K 4.6 09/01/2011 1600   CL 106 08/15/2013 0444   CL 98 08/14/2013 2258   CL 106 09/01/2011 1600   CO2 25 08/15/2013 0444   CO2 24 08/14/2013 2258   CO2 25 09/01/2011 1600   GLUCOSE 124* 08/15/2013 0444   GLUCOSE 185* 08/14/2013 2258   GLUCOSE 59* 09/01/2011 1600   BUN 17 08/15/2013 0444   BUN 20 08/14/2013 2258   BUN 18 09/01/2011 1600   CREATININE 0.81 08/15/2013 0444   CREATININE  0.83 08/14/2013 2258   CREATININE 0.78 11/24/2011 0724   CREATININE 0.81 09/01/2011 1600   CREATININE 0.81 07/06/2009 1130   CALCIUM 8.5 08/15/2013 0444   CALCIUM 9.4 08/14/2013 2258   CALCIUM 8.8 09/01/2011 1600   GFRNONAA 72* 08/15/2013 0444   GFRNONAA 70* 08/14/2013 2258   GFRNONAA >60 07/06/2009 1130   GFRAA 83* 08/15/2013 0444   GFRAA 81* 08/14/2013 2258   GFRAA  Value: >60        The eGFR has been calculated using the MDRD equation. This calculation has not been validated in all clinical situations. eGFR's persistently <60 mL/min signify possible Chronic Kidney Disease. 07/06/2009 1130   CMP     Component Value Date/Time   NA 141 08/15/2013 0444   K 4.7 08/15/2013 0444   CL 106 08/15/2013 0444   CO2 25 08/15/2013 0444   GLUCOSE 124* 08/15/2013 0444   BUN 17 08/15/2013 0444   CREATININE 0.81 08/15/2013 0444   CREATININE 0.78 11/24/2011 0724   CALCIUM 8.5 08/15/2013 0444   PROT 7.2 08/14/2013 2258   ALBUMIN 3.7 08/14/2013 2258   AST 19 08/14/2013 2258   ALT 15 08/14/2013 2258   ALKPHOS 95 08/14/2013 2258   BILITOT <0.2* 08/14/2013 2258   GFRNONAA 72* 08/15/2013 0444   GFRAA 83* 08/15/2013 0444       Component Value Date/Time   WBC 8.7 08/21/2013 1205   WBC 7.1 08/16/2013 0412   WBC 8.6 08/15/2013 0444   HGB 12.5 08/21/2013 1205   HGB 12.7 08/16/2013 0412   HGB 11.8* 08/15/2013 0444   HCT 35.9* 08/21/2013 1205   HCT 39.2 08/16/2013 0412   HCT 36.0 08/15/2013 0444   MCV 84.5 08/21/2013 1205   MCV 91.4 08/16/2013 0412   MCV 91.8 08/15/2013 0444    Lipid Panel     Component Value Date/Time   CHOL 231* 08/16/2013 0412   TRIG 155* 08/16/2013 0412   HDL 42 08/16/2013 0412   CHOLHDL 5.5 08/16/2013 0412   VLDL 31 08/16/2013 0412   LDLCALC 158* 08/16/2013 0412    ABG No results found for this basename: phart, pco2, pco2art, po2, po2art, hco3, tco2, acidbasedef, o2sat     No results found for this basename: TSH   BNP (last 3 results) No results found for  this basename: PROBNP,  in the last 8760  hours Cardiac Panel (last 3 results) No results found for this basename: CKTOTAL, CKMB, TROPONINI, RELINDX,  in the last 72 hours  Iron/TIBC/Ferritin No results found for this basename: iron, tibc, ferritin     EKG Orders placed during the hospital encounter of 08/14/13  . ED EKG  . ED EKG  . EKG 12-LEAD  . EKG 12-LEAD  . EKG 12-LEAD  . EKG 12-LEAD  . EKG  . EKG 12-LEAD  . EKG 12-LEAD     Prior Assessment and Plan Problem List as of 09/04/2013     Cardiovascular and Mediastinum   Hypertension   Last Assessment & Plan   06/02/2011 Office Visit Written 06/02/2011  7:55 PM by Yehuda Savannah, MD     Blood pressure control is excellent; current medications will be continued.    Arteriosclerotic cardiovascular disease (ASCVD)   Last Assessment & Plan   06/02/2011 Office Visit Written 06/02/2011  7:51 PM by Yehuda Savannah, MD     No current symptoms to suggest the presence of myocardial ischemia.  We will continue to optimally control cardiovascular risk factors.    Cerebrovascular disease   Last Assessment & Plan   10/08/2010 Office Visit Written 10/09/2010  8:16 AM by Yehuda Savannah, MD     Right carotid bruit noted; no history of neurologic symptoms.  Patient reports periodic carotid ultrasounds; however, I can only locate a study from 2004 at which time she had mild atherosclerosis without significant focal disease.     Rapid atrial fibrillation   Atrial fibrillation with RVR     Respiratory   Asthma     Endocrine   Diabetes mellitus   Last Assessment & Plan   06/02/2011 Office Visit Written 06/02/2011  8:01 PM by Yehuda Savannah, MD     Diabetic control is good.  No renal involvement apparent based upon urine albumin excretion.      Other   Hyperlipidemia   Last Assessment & Plan   06/02/2011 Office Visit Edited 06/09/2011  3:40 PM by Yehuda Savannah, MD     Patient has been able to tolerate only low doses of nonpalpable statins and thus has not had adequate control  of hyperlipidemia.  Nonpharmacologic approaches including plant sterols, oat brain and red yeast rice were suggested to her.  Cholestyramine will also be started at a dose of 6 g 3 times a day with meals and lipids recheck in 1-2 months.    Macular degeneration   Tobacco abuse   Last Assessment & Plan   06/02/2011 Office Visit Edited 06/09/2011  3:41 PM by Yehuda Savannah, MD     Patient reports that cigarette consumption is down to 1/4 pack per day.  She is encouraged to quit entirely.    Palpitations   Last Assessment & Plan   06/02/2011 Office Visit Written 06/02/2011  7:56 PM by Yehuda Savannah, MD     She does not appear to have any significant arrhythmias.  Low-dose acebutolol has been well tolerated and is providing adequate treatment for this problem.    Chest pain   Last Assessment & Plan   06/02/2011 Office Visit Written 06/02/2011  7:53 PM by Yehuda Savannah, MD     No recent chest discomfort.  Functional testing is not required.    Abdominal pain, bilateral lower quadrant   Right upper quadrant pain       Imaging: Dg Chest Portable  1 View  08/14/2013   CLINICAL DATA:  Chest pain.  EXAM: PORTABLE CHEST - 1 VIEW  COMPARISON:  None available for comparison at time of study interpretation.  FINDINGS: The cardiac silhouette appears mildly enlarged, mediastinal silhouette is nonsuspicious, mildly calcified aortic knob. Mild interstitial prominence without pleural effusions or focal consolidations. No pneumothorax.  Multiple EKG lines overlie the patient and may obscure subtle underlying pathology. Soft tissue planes and included osseous structures are nonsuspicious.  IMPRESSION: Mild cardiomegaly and apparent COPD without superimposed acute cardiopulmonary process.   Electronically Signed   By: Elon Alas   On: 08/14/2013 23:41

## 2013-09-04 NOTE — Progress Notes (Signed)
HPI: Barbara [name] is a 73 year old patient of Dr.Branch is seen post hospitalization where she was admitted with atrial fibrillation with RVR with known history of CAD type 2 diabetes hyperlipidemia and hypertension. During hospitalization the patient was started on IV Cardizem and transition to by mouth diltiazem and metoprolol. She was also started on Xarelto.              Echocardiogram was completed during hospitalization revealing a normal EF with ejection fraction of 60-65%. Mitral regurg. She comes today with out further complaints of rapid palpitations. She does state that occasionally she did feel her "heart acting up" but it is not sustained, and she is asymptomatic with it. She is tolerating the Xarelto, but is complaining of the cost issue. She has had some hemorrhoidal bleeding since she has come home, but this was very transient. She refuses any further up lab work concerning this. In his only had one brief episode.   She denies any complaints of chest pain dyspnea on exertion or dizziness.          Allergies  Allergen Reactions  . Bee Venom Anaphylaxis  . Sulfonamide Derivatives Nausea And Vomiting  . Penicillins Itching and Rash    Current Outpatient Prescriptions  Medication Sig Dispense Refill  . Bevacizumab (AVASTIN) 100 MG/4ML SOLN Inject 1.25 mg into the vein every 8 (eight) weeks.      Marland Kitchen BIOTIN 5000 PO Take 1 tablet by mouth daily.      . Calcium Carbonate Antacid (TUMS E-X SUGAR FREE PO) Take 2 tablets by mouth every morning.      . Cholecalciferol (VITAMIN D) 1000 UNITS capsule Take 1,000 Units by mouth at bedtime.       Marland Kitchen HYDROcodone-acetaminophen (VICODIN) 5-500 MG per tablet Take 1 tablet by mouth every 4 (four) hours as needed. FOR PAIN      . insulin glargine (LANTUS) 100 UNIT/ML injection Inject 0.25 mLs (25 Units total) into the skin daily.  10 mL  11  . insulin regular (NOVOLIN R,HUMULIN R) 100 units/mL injection Inject 2-10 Units into the skin 3 (three)  times daily before meals. SLIDING SCALE      . lisinopril (PRINIVIL,ZESTRIL) 10 MG tablet Take 1 tablet (10 mg total) by mouth at bedtime.  30 tablet  12  . metoprolol tartrate (LOPRESSOR) 25 MG tablet Take 12.5-25 mg by mouth See admin instructions. Patient takes 25 mg at 0800 and 2000. She also takes 12.5 mg at [facility] if she feels her heart fluttering/racing.      . Multiple Vitamins-Minerals (ICAPS PO) Take 1 capsule by mouth 2 (two) times daily.       . Rivaroxaban (XARELTO) 20 MG TABS tablet Take 1 tablet (20 mg total) by mouth daily with supper.  30 tablet  12  . vitamin C (ASCORBIC ACID) 500 MG tablet Take 1,000 mg by mouth at bedtime.       . vitamin E 1000 UNIT capsule Take 1,000 Units by mouth at bedtime.       Marland Kitchen zinc gluconate 50 MG tablet Take 50 mg by mouth at bedtime.      Marland Kitchen diltiazem (CARDIZEM CD) 120 MG 24 hr capsule Take 1 capsule (120 mg total) by mouth daily.  90 capsule  3  . nitroGLYCERIN (NITROSTAT) 0.4 MG SL tablet Place 0.4 mg under the tongue every 5 (five) minutes x 3 doses as needed.      Vladimir Faster Glycol-Propyl Glycol (SYSTANE OP) Apply 1 drop  to eye 2 (two) times daily.       No current facility-administered medications for this visit.    Past Medical History  Diagnosis Date  . Hypertension   . Hyperlipidemia     total cholesterol of 271 and LDL of 187 in 07/2010  . 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 Macular degeneration   . Diabetes mellitus     A1c of 7.6 in 04/2010; moderate to high dose insulin  . Tobacco abuse   . Asthma     Mild  . Palpitations   . Chest pain   . GERD (gastroesophageal reflux disease)   . Stroke 1993 2008    short term memory loss  . Chronic back pain     Past Surgical History  Procedure Laterality Date  . Cataract extraction      left    . Appendectomy    . Tubal ligation    . Colonoscopy  2009  . Esophagogastroduodenoscopy  10/21/2011    Procedure: ESOPHAGOGASTRODUODENOSCOPY (EGD);  Surgeon: Rogene Houston, MD;  [facility];  Service: Endoscopy;  Laterality: N/A;  1200  . Colonoscopy  03/30/2012    Procedure: COLONOSCOPY;  Surgeon: Rogene Houston, MD;  [facility]: [facility];  Service: Endoscopy;  Laterality: N/A;  730    ROS: Review of systems complete and found to be negative unless listed above  PHYSICAL EXAM BP 132/49  Pulse 68  Ht 5\' 4"  (1.626 m)  Wt 147 lb (66.679 kg)  BMI 25.22 kg/m2  General: Well developed, well nourished, in no acute distress Head: Eyes PERRLA, No xanthomas.   Normal cephalic and atramatic  Lungs: Clear bilaterally to auscultation and percussion. Heart: HRRR S1 S2, with 1/6 systolic murmur  Pulses are 2+ & equal.            No carotid bruit. No JVD.  No abdominal bruits. No femoral bruits. Abdomen: Bowel sounds are positive, abdomen soft and non-tender without masses or                  Hernia's noted. Msk:  Back normal, normal gait. Normal strength and tone for age. Extremities: No clubbing, cyanosis or edema.  DP +1 Neuro: Alert and oriented X 3. Psych:  Good affect, responds appropriately     ASSESSMENT AND PLAN

## 2013-09-27 ENCOUNTER — Telehealth: Payer: Self-pay | Admitting: Adult Health

## 2013-09-27 MED ORDER — DILTIAZEM HCL 60 MG PO TABS
60.0000 mg | ORAL_TABLET | Freq: Two times a day (BID) | ORAL | Status: DC
Start: 2013-09-27 — End: 2013-10-01

## 2013-09-27 MED ORDER — LISINOPRIL 5 MG PO TABS: 5.0000 mg | ORAL_TABLET | Freq: Every day | ORAL | Status: DC

## 2013-09-27 NOTE — Telephone Encounter (Signed)
Pt of Barbara Sims, NP, will see Dr Harl Bowie on 10-01-13 . Patient states the time release does not work right on her and wants to go back to her old prescription for Diltiazem 60 mg. Pt states ever since she was put on the new medication (4-14) her heart has been racing and skipping more. Also, She is real weak and tired. BP is running 70's/40's. Took BP while on the phone it is 107/54 pulse 69   Pt advised she is taking Lisinopril 10 mg daily.  Please advise

## 2013-09-27 NOTE — Telephone Encounter (Signed)
Reviewed recent note by Ms. Lawrence NP. Patient of Dr. Harl Bowie. If she feels she was doing better on Diltiazem 60 mg BID, then can go back to that for now. Heart rate can be reassessed at her pending visit. Would cut Lisinopril to 5 mg daily - keep an eye on blood pressure.

## 2013-09-27 NOTE — Telephone Encounter (Signed)
Made pt aware . Medication sent via escribe.  

## 2013-10-01 ENCOUNTER — Encounter: Payer: Self-pay | Admitting: Cardiology

## 2013-10-01 ENCOUNTER — Ambulatory Visit (INDEPENDENT_AMBULATORY_CARE_PROVIDER_SITE_OTHER): Payer: Medicare Other | Admitting: Cardiology

## 2013-10-01 VITALS — BP 131/61 | HR 100 | Ht 64.0 in | Wt 147.4 lb

## 2013-10-01 DIAGNOSIS — I4891 Unspecified atrial fibrillation: Secondary | ICD-10-CM

## 2013-10-01 MED ORDER — LISINOPRIL 5 MG PO TABS: 2.5000 mg | ORAL_TABLET | Freq: Every day | ORAL | Status: DC

## 2013-10-01 MED ORDER — DILTIAZEM HCL 90 MG PO TABS: 90.0000 mg | ORAL_TABLET | Freq: Two times a day (BID) | ORAL | Status: DC

## 2013-10-01 NOTE — Progress Notes (Signed)
Clinical Summary Barbara [name] is a 73 y.o.female last seen by NP Purcell Nails, this is our first visit together. She is seen for the following medical problems. This is a focused visit on her symptomatic afib.   1. Afib - on rate control with metoprolol and dilit, xarelto for anticoag. - last visit reports increased symptoms when her dilt was changed from 60mg  bid to 120mg  daily. Reports some low blood pressures around this time as well - continues to have feelings of palpitations since last visit even after going back to dilt 60mg  bid. Occurs daily, with dizziness and occasional SOB.     Past Medical History  Diagnosis Date  . Hypertension   . Hyperlipidemia     total cholesterol of 271 and LDL of 187 in 07/2010  . 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 Macular degeneration   . Diabetes mellitus     A1c of 7.6 in 04/2010; moderate to high dose insulin  . Tobacco abuse   . Asthma     Mild  . Palpitations   . Chest pain   . GERD (gastroesophageal reflux disease)   . Stroke 1993 2008    short term memory loss  . Chronic back pain      Allergies  Allergen Reactions  . Bee Venom Anaphylaxis  . Sulfonamide Derivatives Nausea And Vomiting  . Penicillins Itching and Rash     Current Outpatient Prescriptions  Medication Sig Dispense Refill  . Bevacizumab (AVASTIN) 100 MG/4ML SOLN Inject 1.25 mg into the vein every 8 (eight) weeks.      Marland Kitchen BIOTIN 5000 PO Take 1 tablet by mouth daily.      . Calcium Carbonate Antacid (TUMS E-X SUGAR FREE PO) Take 2 tablets by mouth every morning.      . Cholecalciferol (VITAMIN D) 1000 UNITS capsule Take 1,000 Units by mouth at bedtime.       Marland Kitchen diltiazem (CARDIZEM) 60 MG tablet Take 1 tablet (60 mg total) by mouth 2 (two) times daily.  120 tablet  3  .  HYDROcodone-acetaminophen (VICODIN) 5-500 MG per tablet Take 1 tablet by mouth every 4 (four) hours as needed. FOR PAIN      . insulin glargine (LANTUS) 100 UNIT/ML injection Inject 0.25 mLs (25 Units total) into the skin daily.  10 mL  11  . insulin regular (NOVOLIN R,HUMULIN R) 100 units/mL injection Inject 2-10 Units into the skin 3 (three) times daily before meals. SLIDING SCALE      . lisinopril (PRINIVIL,ZESTRIL) 5 MG tablet Take 1 tablet (5 mg total) by mouth at bedtime.  60 tablet  4  . metoprolol tartrate (LOPRESSOR) 25 MG tablet Take 12.5-25 mg by mouth See admin instructions. Patient takes 25 mg at 0800 and 2000. She also takes 12.5 mg at [facility] if she feels her heart fluttering/racing.      . Multiple Vitamins-Minerals (ICAPS PO) Take 1 capsule by mouth 2 (two) times daily.       . nitroGLYCERIN (NITROSTAT) 0.4 MG SL tablet Place 0.4 mg under the tongue every 5 (five) minutes x 3 doses as needed.      Vladimir Faster Glycol-Propyl Glycol (SYSTANE OP) Apply 1 drop to eye 2 (two) times daily.      . Rivaroxaban (XARELTO) 20 MG TABS tablet Take 1 tablet (20  mg total) by mouth daily with supper.  30 tablet  12  . vitamin C (ASCORBIC ACID) 500 MG tablet Take 1,000 mg by mouth at bedtime.       . vitamin E 1000 UNIT capsule Take 1,000 Units by mouth at bedtime.       Marland Kitchen zinc gluconate 50 MG tablet Take 50 mg by mouth at bedtime.       No current facility-administered medications for this visit.     Past Surgical History  Procedure Laterality Date  . Cataract extraction      left  . Appendectomy    . Tubal ligation    . Colonoscopy  2009  . Esophagogastroduodenoscopy  10/21/2011    Procedure: ESOPHAGOGASTRODUODENOSCOPY (EGD);  Surgeon: Rogene Houston, MD;  [facility]: [facility];  Service: Endoscopy;  Laterality: N/A;  1200  . Colonoscopy  03/30/2012    Procedure: COLONOSCOPY;  Surgeon: Rogene Houston, MD;  [facility]: [facility];  Service: Endoscopy;  Laterality: N/A;  730      Allergies  Allergen Reactions  . Bee Venom Anaphylaxis  . Sulfonamide Derivatives Nausea And Vomiting  . Penicillins Itching and Rash      No family history on file.   Social History Ms. Buerkle reports that she has quit smoking. Her smoking use included Cigarettes. She has a 7.5 pack-year smoking history. She has never used smokeless tobacco. Ms. Widener reports that she does not drink alcohol.   Review of Systems CONSTITUTIONAL: No weight loss, fever, chills, weakness or fatigue.  HEENT: Eyes: No visual loss, blurred vision, double vision or yellow sclerae.No hearing loss, sneezing, congestion, runny nose or sore throat.  SKIN: No rash or itching.  CARDIOVASCULAR: per HPI RESPIRATORY: No shortness of breath, cough or sputum.  GASTROINTESTINAL: No anorexia, nausea, vomiting or diarrhea. No abdominal pain or blood.  GENITOURINARY: No burning on urination, no polyuria NEUROLOGICAL: No headache, dizziness, syncope, paralysis, ataxia, numbness or tingling in the extremities. No change in bowel or bladder control.  MUSCULOSKELETAL: No muscle, back pain, joint pain or stiffness.  LYMPHATICS: No enlarged nodes. No history of splenectomy.  PSYCHIATRIC: No history of depression or anxiety.  ENDOCRINOLOGIC: No reports of sweating, cold or heat intolerance. No polyuria or polydipsia.  Marland Kitchen   Physical Examination p 100 bp 131/61 Wt 147 lbs BMI 25 Gen: resting comfortably, no acute distress HEENT: no scleral icterus, pupils equal round and reactive, no palptable cervical adenopathy,  CV: irreg, no m/r/g, no JVD Resp: Clear to auscultation bilaterally GI: abdomen is soft, non-tender, non-distended, normal bowel sounds, no hepatosplenomegaly MSK: extremities are warm, no edema.  Skin: warm, no rash Neuro:  no focal deficits Psych: appropriate affect   Diagnostic Studies 03/2010 Cath RESULTS:  The left main coronary artery had a 20% discrete stenosis.  Left anterior descending  artery had 20% multiple discrete lesions in the  proximal and mid portion. Distal vessel had a 40% discrete lesion.  The circumflex coronary artery had 30% multiple discrete lesions in the mid  and distal portion.  Right coronary artery was dominant. There were 30% multiple discrete lesions  in the proximal, mid, and distal vessel. The PDA had 20% multiple discrete  lesions.  RAO VENTRICULOGRAPHY: RAO ventriculography showed hyperdynamic LV function.  The ejection fraction was in excess of 80%. There was no gradient across the  aortic valve and no MR. Aortic pressure was 110/50. LV pressure was 110/4.  IMPRESSION: The patients chest pain would appear to be noncardiac  in  etiology. She will be discharged home later today to follow up with Dr.  Willey Blade.   07/2013 Echo Study Conclusions - Procedure narrative: Transthoracic echocardiography. Image quality was suboptimal. The study was technically difficult, as a result of poor sound wave transmission. - Left ventricle: The cavity size was normal. Wall thickness was increased in a pattern of mild LVH. Systolic function was normal. The estimated ejection fraction was in the range of 60% to 65%. Wall motion was normal; there were no regional wall motion abnormalities. The study was not technically sufficient to allow evaluation of LV diastolic dysfunction due to atrial fibrillation. - Mitral valve: Mildly thickened leaflets . Mild regurgitation.     Assessment and Plan  1. Afib - currently having symptoms, will increase her dilt to 90mg  bid, continue current metoprolol dose - continue xarelto for anticoag  2. HTN - decrease lisinopril to 2.5mg  daily to allow room with bp for increased dilt    - f/u 1 month   Arnoldo Lenis, M.D., F.A.C.C.

## 2013-10-01 NOTE — Patient Instructions (Signed)
Your physician recommends that you schedule a follow-up appointment in: 1 month     Your physician has recommended you make the following change in your medication:     INCREASE Cardizem to 90 mg twice a day   DECREASE Lisinopril to 2.5 mg daily

## 2013-11-19 ENCOUNTER — Encounter: Payer: Self-pay | Admitting: Cardiology

## 2013-11-19 ENCOUNTER — Ambulatory Visit (INDEPENDENT_AMBULATORY_CARE_PROVIDER_SITE_OTHER): Payer: Medicare Other | Admitting: Cardiology

## 2013-11-19 VITALS — BP 122/62 | HR 49 | Ht 64.0 in | Wt 144.0 lb

## 2013-11-19 DIAGNOSIS — I10 Essential (primary) hypertension: Secondary | ICD-10-CM

## 2013-11-19 DIAGNOSIS — I4891 Unspecified atrial fibrillation: Secondary | ICD-10-CM

## 2013-11-19 NOTE — Progress Notes (Signed)
Clinical Summary Barbara [name] is a 73 y.o.female seen today for follow up of the following medical problems.  1. Afib  - on rate control with metoprolol and dilit  - last visit increased her diltiazem to 90mg  bid. She is taking metoprolol 25mg  tid. Overall symptoms much improved. Denies any lightheadedness or dizziness. Managed with shorting acting dilt and lopressor due to some occasional lability in bp and heart rate. - she stopped xarelto because of bright red blood in commode, off x 4 weeks. Has appt with GI soon. Has started ASA 325mg  daily, no bleeding issues on ASA.   2. HTN - compliant with meds - checks regularly at home, typically 120s/60  3. Hyperlipidemia - followed by pcp, reports prior side effects on statins   Past Medical History  Diagnosis Date  . Hypertension   . Hyperlipidemia     total cholesterol of 271 and LDL of 187 in 07/2010  . 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 Macular degeneration   . Diabetes mellitus     A1c of 7.6 in 04/2010; moderate to high dose insulin  . Tobacco abuse   . Asthma     Mild  . Palpitations   . Chest pain   . GERD (gastroesophageal reflux disease)   . Stroke 1993 2008    short term memory loss  . Chronic back pain      Allergies  Allergen Reactions  . Bee Venom Anaphylaxis  . Sulfonamide Derivatives Nausea And Vomiting  . Penicillins Itching and Rash     Current Outpatient Prescriptions  Medication Sig Dispense Refill  . Bevacizumab (AVASTIN) 100 MG/4ML SOLN Inject 1.25 mg into the vein every 8 (eight) weeks.      Marland Kitchen BIOTIN 5000 PO Take 1 tablet by mouth daily.      . Calcium Carbonate Antacid (TUMS E-X SUGAR FREE PO) Take 2 tablets by mouth every morning.      . Cholecalciferol (VITAMIN D) 1000 UNITS capsule Take 1,000  Units by mouth at bedtime.       Marland Kitchen diltiazem (CARDIZEM) 90 MG tablet Take 1 tablet (90 mg total) by mouth 2 (two) times daily.  180 tablet  3  . HYDROcodone-acetaminophen (VICODIN) 5-500 MG per tablet Take 1 tablet by mouth every 4 (four) hours as needed. FOR PAIN      . insulin glargine (LANTUS) 100 UNIT/ML injection Inject 0.25 mLs (25 Units total) into the skin daily.  10 mL  11  . insulin regular (NOVOLIN R,HUMULIN R) 100 units/mL injection Inject 2-10 Units into the skin 3 (three) times daily before meals. SLIDING SCALE      . lisinopril (PRINIVIL,ZESTRIL) 5 MG tablet Take 0.5 tablets (2.5 mg total) by mouth at bedtime.  45 tablet  3  . metoprolol tartrate (LOPRESSOR) 25 MG tablet Take 12.5-25 mg by mouth See admin instructions. Patient takes 25 mg at 0800 and 2000. She also takes 12.5 mg at [facility] if she feels her heart fluttering/racing.      . Multiple Vitamins-Minerals (ICAPS PO) Take 1 capsule by mouth 2 (two) times daily.       . nitroGLYCERIN (NITROSTAT) 0.4 MG SL tablet Place 0.4 mg under the tongue every 5 (five) minutes x 3 doses as needed.      Vladimir Faster Glycol-Propyl Glycol (SYSTANE OP)  Apply 1 drop to eye 2 (two) times daily.      . Rivaroxaban (XARELTO) 20 MG TABS tablet Take 1 tablet (20 mg total) by mouth daily with supper.  30 tablet  12  . vitamin C (ASCORBIC ACID) 500 MG tablet Take 1,000 mg by mouth at bedtime.       . vitamin E 1000 UNIT capsule Take 1,000 Units by mouth at bedtime.       Marland Kitchen zinc gluconate 50 MG tablet Take 50 mg by mouth at bedtime.       No current facility-administered medications for this visit.     Past Surgical History  Procedure Laterality Date  . Cataract extraction      left  . Appendectomy    . Tubal ligation    . Colonoscopy  2009  . Esophagogastroduodenoscopy  10/21/2011    Procedure: ESOPHAGOGASTRODUODENOSCOPY (EGD);  Surgeon: Rogene Houston, MD;  [facility];  Service: Endoscopy;  Laterality: N/A;  1200  . Colonoscopy   03/30/2012    Procedure: COLONOSCOPY;  Surgeon: Rogene Houston, MD;  [facility];  Service: Endoscopy;  Laterality: N/A;  730     Allergies  Allergen Reactions  . Bee Venom Anaphylaxis  . Sulfonamide Derivatives Nausea And Vomiting  . Penicillins Itching and Rash      No family history on file.   Social History Barbara [name] reports that she has quit smoking. Her smoking use included Cigarettes. She has a 7.5 pack-year smoking history. She has never used smokeless tobacco. Barbara [name] reports that she does not drink alcohol.   Review of Systems CONSTITUTIONAL: No weight loss, fever, chills, weakness or fatigue.  HEENT: Eyes: No visual loss, blurred vision, double vision or yellow sclerae.No hearing loss, sneezing, congestion, runny nose or sore throat.  SKIN: No rash or itching.  CARDIOVASCULAR: per HPI RESPIRATORY: No shortness of breath, cough or sputum.  GASTROINTESTINAL: +hematochezia GENITOURINARY: No burning on urination, no polyuria NEUROLOGICAL: No headache, dizziness, syncope, paralysis, ataxia, numbness or tingling in the extremities. No change in bowel or bladder control.  MUSCULOSKELETAL: No muscle, back pain, joint pain or stiffness.  LYMPHATICS: No enlarged nodes. No history of splenectomy.  PSYCHIATRIC: No history of depression or anxiety.  ENDOCRINOLOGIC: No reports of sweating, cold or heat intolerance. No polyuria or polydipsia.  Marland Kitchen   Physical Examination p 56 bp 122/62 Wt 144 lbs BMI 25 Gen: resting comfortably, no acute distress HEENT: no scleral icterus, pupils equal round and reactive, no palptable cervical adenopathy,  CV: RRR, no m/r/g, no JVD Resp: Clear to auscultation bilaterally GI: abdomen is soft, non-tender, non-distended, normal bowel sounds, no hepatosplenomegaly MSK: extremities are warm, no edema.  Skin: warm, no rash Neuro:  no focal deficits Psych: appropriate affect   Diagnostic Studies 03/2010 Cath  RESULTS:  The  left main coronary artery had a 20% discrete stenosis.  Left anterior descending artery had 20% multiple discrete lesions in the  proximal and mid portion. Distal vessel had a 40% discrete lesion.  The circumflex coronary artery had 30% multiple discrete lesions in the mid  and distal portion.  Right coronary artery was dominant. There were 30% multiple discrete lesions  in the proximal, mid, and distal vessel. The PDA had 20% multiple discrete  lesions.  RAO VENTRICULOGRAPHY: RAO ventriculography showed hyperdynamic LV function.  The ejection fraction was in excess of 80%. There was no gradient across the  aortic valve and no MR. Aortic pressure was 110/50.  LV pressure was 110/4.  IMPRESSION: The patients chest pain would appear to be noncardiac in  etiology. She will be discharged home later today to follow up with Dr.  Willey Blade.   07/2013 Echo  Study Conclusions - Procedure narrative: Transthoracic echocardiography. Image quality was suboptimal. The study was technically difficult, as a result of poor sound wave transmission. - Left ventricle: The cavity size was normal. Wall thickness was increased in a pattern of mild LVH. Systolic function was normal. The estimated ejection fraction was in the range of 60% to 65%. Wall motion was normal; there were no regional wall motion abnormalities. The study was not technically sufficient to allow evaluation of LV diastolic dysfunction due to atrial fibrillation. - Mitral valve: Mildly thickened leaflets . Mild regurgitation.        Assessment and Plan   1. Afib  - symptoms better after dilt increase last visit, she remains on short acting agents to allow more ability to vary her regimen on a daily basis as she can at times have labile heart rates and blood pressures - awaiting GI workup due to recent GI bleed, she is off xarelto for now, continuing aspirin  2. HTN  - at goal, continue current meds  3. Hyperlipidemia - followed by  pcp, reportedly side effects to multiple agents in the past  F/u 6 months      Arnoldo Lenis, M.D., F.A.C.C.

## 2013-11-19 NOTE — Patient Instructions (Signed)
Your physician wants you to follow-up in: 6 months You will receive a reminder letter in the mail two months in advance. If you don't receive a letter, please call our office to schedule the follow-up appointment.     Your physician recommends that you continue on your current medications as directed. Please refer to the Current Medication list given to you today.      Thank you for choosing [facility] !        

## 2013-11-28 ENCOUNTER — Ambulatory Visit (INDEPENDENT_AMBULATORY_CARE_PROVIDER_SITE_OTHER): Payer: Medicare Other | Admitting: Internal Medicine

## 2013-12-05 ENCOUNTER — Telehealth (INDEPENDENT_AMBULATORY_CARE_PROVIDER_SITE_OTHER): Payer: Self-pay | Admitting: *Deleted

## 2013-12-05 ENCOUNTER — Encounter (INDEPENDENT_AMBULATORY_CARE_PROVIDER_SITE_OTHER): Payer: Self-pay | Admitting: Internal Medicine

## 2013-12-05 ENCOUNTER — Other Ambulatory Visit (INDEPENDENT_AMBULATORY_CARE_PROVIDER_SITE_OTHER): Payer: Self-pay | Admitting: *Deleted

## 2013-12-05 ENCOUNTER — Ambulatory Visit (INDEPENDENT_AMBULATORY_CARE_PROVIDER_SITE_OTHER): Payer: Medicare Other | Admitting: Internal Medicine

## 2013-12-05 VITALS — BP 112/58 | HR 60 | Temp 98.0°F | Ht 64.0 in | Wt 146.0 lb

## 2013-12-05 DIAGNOSIS — Z860100 Personal history of colon polyps, unspecified: Secondary | ICD-10-CM | POA: Insufficient documentation

## 2013-12-05 DIAGNOSIS — K921 Melena: Secondary | ICD-10-CM

## 2013-12-05 DIAGNOSIS — K625 Hemorrhage of anus and rectum: Secondary | ICD-10-CM

## 2013-12-05 DIAGNOSIS — Z1211 Encounter for screening for malignant neoplasm of colon: Secondary | ICD-10-CM

## 2013-12-05 LAB — CBC
HCT: 36.8 % (ref 36.0–46.0)
HEMOGLOBIN: 12.4 g/dL (ref 12.0–15.0)
MCH: 29.4 pg (ref 26.0–34.0)
MCHC: 33.7 g/dL (ref 30.0–36.0)
MCV: 87.2 fL (ref 78.0–100.0)
Platelets: 236 10*3/uL (ref 150–400)
RBC: 4.22 MIL/uL (ref 3.87–5.11)
RDW: 13.4 % (ref 11.5–15.5)
WBC: 8.2 10*3/uL (ref 4.0–10.5)

## 2013-12-05 MED ORDER — PEG-KCL-NACL-NASULF-NA ASC-C 100 G PO SOLR: 1.0000 | Freq: Once | ORAL | Status: DC

## 2013-12-05 NOTE — Telephone Encounter (Signed)
Patient needs movi prep 

## 2013-12-05 NOTE — Progress Notes (Signed)
Subjective:     Patient ID: Barbara [name], female   DOB: 07-09-1940, 73 y.o.   MRN: [id number]  HPI Referred to our office for rectal bleeding.  She was Xarelto 3 months ago for a fast heart beat/atrial fib. She noticed she had rectal bleeding when she would take the [facility]. Stools were black in color. Stool were black for about a week in March. She also tells me she  had bright red rectal bleeding in 3 weeks.  She has been off the Xarelto for about a month. Since being off the [place], there has been no further rectal bleeding (atrial fib) Appetite is fair. She has lost about 8 pounds since March. No abdominal pain. She usually has a BM once a day.  Stools are brown in color. No recent BRRB. No melena.  There has been no change in her stool.   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  Multiple antral erosions with healing ulcer.  Pyloric channel patent but mucosa revealed inflammatory changes.  Erosive bulbar duodenitis.  03/30/2012 Procedure: Colonoscopy with snare polypectomy.  Indications: Patient is 73 year old Caucasian female with history of colonic polyps and family history of colon carcinoma (sister had colon cancer in her 28-70s). Patient's last colonoscopy was 3 years ago at [facility] in [facility] (per Dr. Olevia Perches notes). Impression:  Examination performed to cecum.  10 x 6 mm cecal polyp snared piecemeal. This polyp appeared to be recurrence of previous polypectomy site.  Scattered diverticula at sigmoid colon.  Three small rectal polyps coagulated using snare tip.  External hemorrhoids.  Notes Recorded by Rogene Houston, MD on 04/01/2012 at 2:47 PM Result/message left on her answering service. 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 Hypertension   . Hyperlipidemia     total cholesterol of 271 and LDL of 187 in 07/2010  . Arteriosclerotic  cardiovascular disease (ASCVD)     non-obstructive; negative stress nuclear and normal echo in 08/[facility] in 03/2000-20%  main 20% LAD, 30% circumflex, 30% RCA, hyperdynamic LV  . Macular degeneration   . Diabetes mellitus     A1c of 7.6 in 04/2010; moderate to high dose insulin  . Tobacco abuse   . Asthma     Mild  . Palpitations   . Chest pain   . GERD (gastroesophageal reflux disease)   . Stroke 1993 2008    short term memory loss  . Chronic back pain     Past Surgical History  Procedure Laterality Date  . Cataract extraction      left  . Appendectomy    . Tubal ligation    . Colonoscopy  2009  . Esophagogastroduodenoscopy  10/21/2011    Procedure: ESOPHAGOGASTRODUODENOSCOPY (EGD);  Surgeon: Rogene Houston, MD;  Location: [facility];  Service: Endoscopy;  Laterality: N/A;  1200  . Colonoscopy  03/30/2012    Procedure: COLONOSCOPY;  Surgeon: Rogene Houston, MD;  Location: [facility];  Service: Endoscopy;  Laterality: N/A;  730    Allergies  Allergen Reactions  . Bee Venom Anaphylaxis  . Sulfonamide Derivatives Nausea And Vomiting  . Penicillins Itching and Rash    Current Outpatient Prescriptions on File Prior to Visit  Medication Sig Dispense Refill  . aspirin 325 MG tablet Take 325 mg by mouth daily.      . Bevacizumab (AVASTIN)  100 MG/4ML SOLN Inject 1.25 mg into the vein every 8 (eight) weeks.      . Calcium Carbonate Antacid (TUMS E-X SUGAR FREE PO) Take 2 tablets by mouth every morning.      . Cholecalciferol (VITAMIN D) 1000 UNITS capsule Take 1,000 Units by mouth at bedtime.       Marland Kitchen diltiazem (CARDIZEM) 90 MG tablet Take 1 tablet (90 mg total) by mouth 2 (two) times daily.  180 tablet  3  . HYDROcodone-acetaminophen (VICODIN) 5-500 MG per tablet Take 1 tablet by mouth every 4 (four) hours as needed. FOR PAIN      . insulin regular (NOVOLIN R,HUMULIN R) 100 units/mL injection Inject 2-10 Units into the skin 3 (three) times  daily before meals. SLIDING SCALE      . metoprolol tartrate (LOPRESSOR) 25 MG tablet Take 25 mg by mouth 3 (three) times daily. Patient takes 25 mg at 0800 and 2000. She also takes 12.5 mg at [facility] if she feels her heart fluttering/racing.      Vladimir Faster Glycol-Propyl Glycol (SYSTANE OP) Apply 1 drop to eye 2 (two) times daily.      . vitamin C (ASCORBIC ACID) 500 MG tablet Take 1,000 mg by mouth at bedtime.       . vitamin E 1000 UNIT capsule Take 1,000 Units by mouth at bedtime.       . nitroGLYCERIN (NITROSTAT) 0.4 MG SL tablet Place 0.4 mg under the tongue every 5 (five) minutes x 3 doses as needed.       No current facility-administered medications on file prior to visit.        Objective:   Physical Exam   Filed Vitals:   12/05/13 1601  BP: 112/58  Pulse: 60  Temp: 98 F (36.7 C)  Height: 5\' 4"  (1.626 m)  Weight: 146 lb (66.225 kg)  Alert and oriented. Skin warm and dry. Oral mucosa is moist.   . Sclera anicteric, conjunctivae is pink. Thyroid not enlarged. No cervical lymphadenopathy. Lungs clear. Heart regular rate and rhythm.  Abdomen is soft. Bowel sounds are positive. No hepatomegaly. No abdominal masses felt. No tenderness.  No edema to lower extremities.        Assessment:     Rectal bleeding. Colonic neoplasm needs to be ruled out.  Family hx of colon cancer. Hx of melena: PUD needs to be ruled out.  I discussed with Dr. Laural Golden.     Plan:     EGD/Colonoscopy.The risks and benefits such as perforation, bleeding, and infection were reviewed with the patient and is agreeable. CBC today.

## 2013-12-05 NOTE — Patient Instructions (Signed)
EGD/Colonoscopy. The risks and benefits such as perforation, bleeding, and infection were reviewed with the patient and is agreeable. 

## 2013-12-05 NOTE — Addendum Note (Signed)
Addended by: Butch Penny on: 12/05/2013 04:56 PM   Modules accepted: Orders

## 2013-12-05 NOTE — Addendum Note (Signed)
Addended by: Worthy Keeler on: 12/05/2013 04:55 PM   Modules accepted: Orders

## 2013-12-10 ENCOUNTER — Encounter (HOSPITAL_COMMUNITY): Payer: Self-pay | Admitting: Pharmacy Technician

## 2013-12-13 ENCOUNTER — Ambulatory Visit (HOSPITAL_COMMUNITY)
Admission: RE | Admit: 2013-12-13 | Discharge: 2013-12-13 | Disposition: A | Discharge status: Home / Self Care | Payer: Medicare Other | Source: Ambulatory Visit | Attending: Internal Medicine | Admitting: Internal Medicine

## 2013-12-13 ENCOUNTER — Encounter (HOSPITAL_COMMUNITY): Payer: Self-pay | Admitting: *Deleted

## 2013-12-13 ENCOUNTER — Encounter (HOSPITAL_COMMUNITY)
Admission: RE | Disposition: A | Discharge status: Home / Self Care | Payer: Self-pay | Source: Ambulatory Visit | Attending: Internal Medicine

## 2013-12-13 ENCOUNTER — Ambulatory Visit: Admit: 2013-12-13 | Payer: Self-pay | Admitting: Internal Medicine

## 2013-12-13 DIAGNOSIS — Z794 Long term (current) use of insulin: Secondary | ICD-10-CM | POA: Diagnosis not present

## 2013-12-13 DIAGNOSIS — K219 Gastro-esophageal reflux disease without esophagitis: Secondary | ICD-10-CM | POA: Insufficient documentation

## 2013-12-13 DIAGNOSIS — Z7982 Long term (current) use of aspirin: Secondary | ICD-10-CM | POA: Diagnosis not present

## 2013-12-13 DIAGNOSIS — Z800 Family history of malignant neoplasm of digestive organs: Secondary | ICD-10-CM | POA: Diagnosis not present

## 2013-12-13 DIAGNOSIS — K5730 Diverticulosis of large intestine without perforation or abscess without bleeding: Secondary | ICD-10-CM | POA: Diagnosis not present

## 2013-12-13 DIAGNOSIS — Z860100 Personal history of colon polyps, unspecified: Secondary | ICD-10-CM | POA: Insufficient documentation

## 2013-12-13 DIAGNOSIS — K279 Peptic ulcer, site unspecified, unspecified as acute or chronic, without hemorrhage or perforation: Secondary | ICD-10-CM

## 2013-12-13 DIAGNOSIS — E119 Type 2 diabetes mellitus without complications: Secondary | ICD-10-CM | POA: Diagnosis not present

## 2013-12-13 DIAGNOSIS — K644 Residual hemorrhoidal skin tags: Secondary | ICD-10-CM | POA: Insufficient documentation

## 2013-12-13 DIAGNOSIS — Z79899 Other long term (current) drug therapy: Secondary | ICD-10-CM | POA: Diagnosis not present

## 2013-12-13 DIAGNOSIS — K259 Gastric ulcer, unspecified as acute or chronic, without hemorrhage or perforation: Secondary | ICD-10-CM | POA: Insufficient documentation

## 2013-12-13 DIAGNOSIS — F17200 Nicotine dependence, unspecified, uncomplicated: Secondary | ICD-10-CM | POA: Diagnosis not present

## 2013-12-13 DIAGNOSIS — K2960 Other gastritis without bleeding: Secondary | ICD-10-CM

## 2013-12-13 DIAGNOSIS — I10 Essential (primary) hypertension: Secondary | ICD-10-CM | POA: Insufficient documentation

## 2013-12-13 DIAGNOSIS — K625 Hemorrhage of anus and rectum: Secondary | ICD-10-CM | POA: Diagnosis not present

## 2013-12-13 DIAGNOSIS — K2100 Gastro-esophageal reflux disease with esophagitis, without bleeding: Secondary | ICD-10-CM

## 2013-12-13 DIAGNOSIS — E785 Hyperlipidemia, unspecified: Secondary | ICD-10-CM | POA: Insufficient documentation

## 2013-12-13 DIAGNOSIS — K921 Melena: Secondary | ICD-10-CM | POA: Insufficient documentation

## 2013-12-13 DIAGNOSIS — K449 Diaphragmatic hernia without obstruction or gangrene: Secondary | ICD-10-CM

## 2013-12-13 DIAGNOSIS — K2980 Duodenitis without bleeding: Secondary | ICD-10-CM

## 2013-12-13 HISTORY — PX: COLONOSCOPY: SHX5424

## 2013-12-13 HISTORY — PX: ESOPHAGOGASTRODUODENOSCOPY: SHX5428

## 2013-12-13 LAB — GLUCOSE, CAPILLARY: Glucose-Capillary: 263 mg/dL — ABNORMAL HIGH (ref 70–99)

## 2013-12-13 SURGERY — COLONOSCOPY
Anesthesia: Moderate Sedation

## 2013-12-13 SURGERY — EGD (ESOPHAGOGASTRODUODENOSCOPY)
Anesthesia: Moderate Sedation

## 2013-12-13 MED ORDER — MEPERIDINE HCL 50 MG/ML IJ SOLN
INTRAMUSCULAR | Status: DC | PRN
  Administered 2013-12-13 (×2): 25 mg via INTRAVENOUS

## 2013-12-13 MED ORDER — BUTAMBEN-TETRACAINE-BENZOCAINE 2-2-14 % EX AERO
INHALATION_SPRAY | CUTANEOUS | Status: DC | PRN
  Administered 2013-12-13: 2 via TOPICAL

## 2013-12-13 MED ORDER — PANTOPRAZOLE SODIUM 40 MG PO TBEC: 40.0000 mg | DELAYED_RELEASE_TABLET | Freq: Every day | ORAL | Status: DC

## 2013-12-13 MED ORDER — LIDOCAINE HCL 2 % EX GEL
CUTANEOUS | Status: AC
  Filled 2013-12-13: qty 30

## 2013-12-13 MED ORDER — MEPERIDINE HCL 50 MG/ML IJ SOLN
INTRAMUSCULAR | Status: AC
  Filled 2013-12-13: qty 1

## 2013-12-13 MED ORDER — STERILE WATER FOR IRRIGATION IR SOLN
Status: DC | PRN
  Administered 2013-12-13: 12:00:00

## 2013-12-13 MED ORDER — SODIUM CHLORIDE 0.9 % IV SOLN
INTRAVENOUS | Status: DC
  Administered 2013-12-13: 11:00:00 via INTRAVENOUS

## 2013-12-13 MED ORDER — MIDAZOLAM HCL 5 MG/5ML IJ SOLN
INTRAMUSCULAR | Status: AC
  Filled 2013-12-13: qty 10

## 2013-12-13 MED ORDER — MIDAZOLAM HCL 5 MG/5ML IJ SOLN
INTRAMUSCULAR | Status: DC | PRN
  Administered 2013-12-13: 2 mg via INTRAVENOUS
  Administered 2013-12-13: 1 mg via INTRAVENOUS
  Administered 2013-12-13: 2 mg via INTRAVENOUS

## 2013-12-13 NOTE — Op Note (Addendum)
EGD AND COLONOSCOPY PROCEDURE REPORT  PATIENT:  Barbara [name]  MR#:  [id number] Birthdate:  1940-08-25, 73 y.o., female Endoscopist:  Dr. Rogene Houston, MD Referred By:  Dr. Asencion Noble, MD  Procedure Date: 12/13/2013  Procedure:   EGD & Colonoscopy  Indications:  Patient is 73 year old Caucasian who experienced melena and related rectal bleeding while on Xeralto. She has history of colonic adenomas and family history of colon carcinoma. Rations last colonoscopy with polypectomy was in November 2013.            Informed Consent:  The risks, benefits, alternatives & imponderables which include, but are not limited to, bleeding, infection, perforation, drug reaction and potential missed lesion have been reviewed.  The potential for biopsy, lesion removal, esophageal dilation, etc. have also been discussed.  Questions have been answered.  All parties agreeable.  Please see history & physical in medical record for more information.  Medications:  Demerol 50 mg IV Versed 5 mg IV Cetacaine spray topically for oropharyngeal anesthesia  EGD  Description of procedure:  The endoscope was introduced through the mouth and advanced to the second portion of the duodenum without difficulty or limitations. The mucosal surfaces were surveyed very carefully during advancement of the scope and upon withdrawal.  Findings:  Esophagus:  Mucosa of the esophagus was normal. Focal erythema and edema noted at GE junction. GEJ:  38 cm Hiatus:  40 cm Stomach:  Stomach was empty and distended very well with insufflation. Folds in the proximal stomach were normal. Multiple antral erosions noted along with two small ulcers with clean base in prepyloric region. Mucosa at pyloric channel was friable but no ulcer or stricture noted. Angularis fundus and cardia were unremarkable. Duodenum:  Patchy bulbar edema and erythema noted.  Therapeutic/Diagnostic Maneuvers Performed:  None  COLONOSCOPY Description of procedure:   After a digital rectal exam was performed, that colonoscope was advanced from the anus through the rectum and colon to the area of the cecum, ileocecal valve and appendiceal orifice. The cecum was deeply intubated. These structures were well-seen and photographed for the record. From the level of the cecum and ileocecal valve, the scope was slowly and cautiously withdrawn. The mucosal surfaces were carefully surveyed utilizing scope tip to flexion to facilitate fold flattening as needed. The scope was pulled down into the rectum where a thorough exam including retroflexion was performed.  Findings:   Prep excellent. Scattered diverticula noted at descending and sigmoid colon. Normal rectal mucosa. Small hemorrhoids below the dentate line.  Therapeutic/Diagnostic Maneuvers Performed:  None  Complications:  None  Cecal Withdrawal Time:  7 minutes  Impression:   EGD findings; Mild changes of reflux esophagitis at GE junction. Small sliding hiatal hernia. Two small gastric ulcer at antrum along with multiple erosions. Nonerosive bulbar duodenitis.  Colonoscopy findings; Examination performed to cecum. No evidence of recurrent polyps. Left-sided diverticulosis. Small external hemorrhoids.  Comments; She possibly bled from gastric ulcers and colonic diverticula. No further workup unless bleeding recurs.  Recommendations:  Pantoprazole 40 mg by mouth every morning. Resume aspirin at full dose until office visit with cardiologist at which time will discuss anticoagulation options. H. pylori serology. Next colonoscopy in 5 years.   REHMAN,NAJEEB U  12/13/2013 12:44 PM  CC: Dr. Asencion Noble, MD & Dr. Rayne Du ref. provider found

## 2013-12-13 NOTE — Discharge Instructions (Signed)
Resume usual medications and high fiber diet. Pantoprazole 40 mg by mouth 30 minutes before breakfast daily. No driving for 24 hours. Physician will cause it results of blood test.    Peptic Ulcer A peptic ulcer is a sore in the lining of your esophagus (esophageal ulcer), stomach (gastric ulcer), or in the first part of your small intestine (duodenal ulcer). The ulcer causes erosion into the deeper tissue. CAUSES  Normally, the lining of the stomach and the small intestine protects itself from the acid that digests food. The protective lining can be damaged by:  An infection caused by a bacterium called Helicobacter pylori (H. pylori).  Regular use of nonsteroidal anti-inflammatory drugs (NSAIDs), such as ibuprofen or aspirin.  Smoking tobacco. Other risk factors include being older than 49, drinking alcohol excessively, and having a family history of ulcer disease.  SYMPTOMS   Burning pain or gnawing in the area between the chest and the belly button.  Heartburn.  Nausea and vomiting.  Bloating. The pain can be worse on an empty stomach and at night. If the ulcer results in bleeding, it can cause:  Black, tarry stools.  Vomiting of bright red blood.  Vomiting of coffee-ground-looking materials. DIAGNOSIS  A diagnosis is usually made based upon your history and an exam. Other tests and procedures may be performed to find the cause of the ulcer. Finding a cause will help determine the best treatment. Tests and procedures may include:  Blood tests, stool tests, or breath tests to check for the bacterium H. pylori.  An upper gastrointestinal (GI) series of the esophagus, stomach, and small intestine.  An endoscopy to examine the esophagus, stomach, and small intestine.  A biopsy. TREATMENT  Treatment may include:  Eliminating the cause of the ulcer, such as smoking, NSAIDs, or alcohol.  Medicines to reduce the amount of acid in your digestive tract.  Antibiotic  medicines if the ulcer is caused by the H. pylori bacterium.  An upper endoscopy to treat a bleeding ulcer.  Surgery if the bleeding is severe or if the ulcer created a hole somewhere in the digestive system. HOME CARE INSTRUCTIONS   Avoid tobacco, alcohol, and caffeine. Smoking can increase the acid in the stomach, and continued smoking will impair the healing of ulcers.  Avoid foods and drinks that seem to cause discomfort or aggravate your ulcer.  Only take medicines as directed by your caregiver. Do not substitute over-the-counter medicines for prescription medicines without talking to your caregiver.  Keep any follow-up appointments and tests as directed. SEEK MEDICAL CARE IF:   Your do not improve within 7 days of starting treatment.  You have ongoing indigestion or heartburn. SEEK IMMEDIATE MEDICAL CARE IF:   You have sudden, sharp, or persistent abdominal pain.  You have bloody or dark black, tarry stools.  You vomit blood or vomit that looks like coffee grounds.  You become light-headed, weak, or feel faint.  You become sweaty or clammy. MAKE SURE YOU:   Understand these instructions.  Will watch your condition.  Will get help right away if you are not doing well or get worse. Document Released: 05/07/2000 Document Revised: 09/24/2013 Document Reviewed: 12/08/2011 [facility] Patient Information 2015 [place], [place]. This information is not intended to replace advice given to you by your health care provider. Make sure you discuss any questions you have with your health care provider. Hemorrhoids Hemorrhoids are swollen veins around the rectum or anus. There are two types of hemorrhoids:   Internal hemorrhoids. These  occur in the veins just inside the rectum. They may poke through to the outside and become irritated and painful.  External hemorrhoids. These occur in the veins outside the anus and can be felt as a painful swelling or hard lump near the  anus. CAUSES  Pregnancy.   Obesity.   Constipation or diarrhea.   Straining to have a bowel movement.   Sitting for long periods on the toilet.  Heavy lifting or other activity that caused you to strain.  Anal intercourse. SYMPTOMS   Pain.   Anal itching or irritation.   Rectal bleeding.   Fecal leakage.   Anal swelling.   One or more lumps around the anus.  DIAGNOSIS  Your caregiver may be able to diagnose hemorrhoids by visual examination. Other examinations or tests that may be performed include:   Examination of the rectal area with a gloved hand (digital rectal exam).   Examination of anal canal using a small tube (scope).   A blood test if you have lost a significant amount of blood.  A test to look inside the colon (sigmoidoscopy or colonoscopy). TREATMENT Most hemorrhoids can be treated at home. However, if symptoms do not seem to be getting better or if you have a lot of rectal bleeding, your caregiver may perform a procedure to help make the hemorrhoids get smaller or remove them completely. Possible treatments include:   Placing a rubber band at the base of the hemorrhoid to cut off the circulation (rubber band ligation).   Injecting a chemical to shrink the hemorrhoid (sclerotherapy).   Using a tool to burn the hemorrhoid (infrared light therapy).   Surgically removing the hemorrhoid (hemorrhoidectomy).   Stapling the hemorrhoid to block blood flow to the tissue (hemorrhoid stapling).  HOME CARE INSTRUCTIONS   Eat foods with fiber, such as whole grains, beans, nuts, fruits, and vegetables. Ask your doctor about taking products with added fiber in them (fibersupplements).  Increase fluid intake. Drink enough water and fluids to keep your urine clear or pale yellow.   Exercise regularly.   Go to the bathroom when you have the urge to have a bowel movement. Do not wait.   Avoid straining to have bowel movements.   Keep the  anal area dry and clean. Use wet toilet paper or moist towelettes after a bowel movement.   Medicated creams and suppositories may be used or applied as directed.   Only take over-the-counter or prescription medicines as directed by your caregiver.   Take warm sitz baths for 15-20 minutes, 3-4 times a day to ease pain and discomfort.   Place ice packs on the hemorrhoids if they are tender and swollen. Using ice packs between sitz baths may be helpful.   Put ice in a plastic bag.   Place a towel between your skin and the bag.   Leave the ice on for 15-20 minutes, 3-4 times a day.   Do not use a donut-shaped pillow or sit on the toilet for long periods. This increases blood pooling and pain.  SEEK MEDICAL CARE IF:  You have increasing pain and swelling that is not controlled by treatment or medicine.  You have uncontrolled bleeding.  You have difficulty or you are unable to have a bowel movement.  You have pain or inflammation outside the area of the hemorrhoids. MAKE SURE YOU:  Understand these instructions.  Will watch your condition.  Will get help right away if you are not doing well or get worse.  Document Released: 05/07/2000 Document Revised: 04/26/2012 Document Reviewed: 03/14/2012 [facility] Patient Information 2015 [place], [place]. This information is not intended to replace advice given to you by your health care provider. Make sure you discuss any questions you have with your health care provider. Diverticulosis Diverticulosis is the condition that develops when small pouches (diverticula) form in the wall of your colon. Your colon, or large intestine, is where water is absorbed and stool is formed. The pouches form when the inside layer of your colon pushes through weak spots in the outer layers of your colon. CAUSES  No one knows exactly what causes diverticulosis. RISK FACTORS  Being older than 55. Your risk for this condition increases with age.  Diverticulosis is rare in people younger than 40 years. By age 39, almost everyone has it.  Eating a low-fiber diet.  Being frequently constipated.  Being overweight.  Not getting enough exercise.  Smoking.  Taking over-the-counter pain medicines, like aspirin and ibuprofen. SYMPTOMS  Most people with diverticulosis do not have symptoms. DIAGNOSIS  Because diverticulosis often has no symptoms, health care providers often discover the condition during an exam for other colon problems. In many cases, a health care provider will diagnose diverticulosis while using a flexible scope to examine the colon (colonoscopy). TREATMENT  If you have never developed an infection related to diverticulosis, you may not need treatment. If you have had an infection before, treatment may include:  Eating more fruits, vegetables, and grains.  Taking a fiber supplement.  Taking a live bacteria supplement (probiotic).  Taking medicine to relax your colon. HOME CARE INSTRUCTIONS   Drink at least 6-8 glasses of water each day to prevent constipation.  Try not to strain when you have a bowel movement.  Keep all follow-up appointments. If you have had an infection before:  Increase the fiber in your diet as directed by your health care provider or dietitian.  Take a dietary fiber supplement if your health care provider approves.  Only take medicines as directed by your health care provider. SEEK MEDICAL CARE IF:   You have abdominal pain.  You have bloating.  You have cramps.  You have not gone to the bathroom in 3 days. SEEK IMMEDIATE MEDICAL CARE IF:   Your pain gets worse.  Yourbloating becomes very bad.  You have a fever or chills, and your symptoms suddenly get worse.  You begin vomiting.  You have bowel movements that are bloody or black. MAKE SURE YOU:  Understand these instructions.  Will watch your condition.  Will get help right away if you are not doing well or  get worse. Document Released: 02/05/2004 Document Revised: 05/15/2013 Document Reviewed: 04/04/2013 [facility] Patient Information 2015 [place], [place]. This information is not intended to replace advice given to you by your health care provider. Make sure you discuss any questions you have with your health care provider.

## 2013-12-13 NOTE — H&P (Signed)
Barbara [name] is an 73 y.o. female.   Chief Complaint: Patient is here for EGD and colonoscopy. HPI: Patient is 73 year old Caucasian female who who presents with history of melena and rectal bleeding. Initially she had melena while she was on Xeralto. She stopped the medication for a few days and then back 100 and then had melena. She therefore has stopped the medication. She denies nausea vomiting abdominal pain. She has history of heartburn but lately she's been asymptomatic. She has history of colonic adenomas. Last colonoscopy with polypectomy was in November 2013. Family history is positive for CRC in her sister. H&H last week was 12.4 and 36.8  Past Medical History  Diagnosis Date  . Hypertension   . Hyperlipidemia     total cholesterol of 271 and LDL of 187 in 07/2010  . 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 Macular degeneration   . Diabetes mellitus     A1c of 7.6 in 04/2010; moderate to high dose insulin  . Tobacco abuse   . Asthma     Mild  . Palpitations   . Chest pain   . GERD (gastroesophageal reflux disease)   . Stroke 1993 2008    short term memory loss  . Chronic back pain     Past Surgical History  Procedure Laterality Date  . Cataract extraction      left  . Appendectomy    . Tubal ligation    . Colonoscopy  2009  . Esophagogastroduodenoscopy  10/21/2011    Procedure: ESOPHAGOGASTRODUODENOSCOPY (EGD);  Surgeon: Rogene Houston, MD;  [facility];  Service: Endoscopy;  Laterality: N/A;  1200  . Colonoscopy  03/30/2012    Procedure: COLONOSCOPY;  Surgeon: Rogene Houston, MD;  [facility];  Service: Endoscopy;  Laterality: N/A;  730    History reviewed. No pertinent family history. Social History:  reports that she has been smoking Cigarettes.  She  has a 3.75 pack-year smoking history. She has never used smokeless tobacco. She reports that she does not drink alcohol or use illicit drugs.  Allergies:  Allergies  Allergen Reactions  . Bee Venom Anaphylaxis  . Sulfonamide Derivatives Nausea And Vomiting  . Penicillins Itching and Rash    Medications Prior to Admission  Medication Sig Dispense Refill  . aspirin 325 MG tablet Take 325 mg by mouth daily.      . Bevacizumab (AVASTIN) 100 MG/4ML SOLN Inject 1.25 mg into the vein every 8 (eight) weeks.      . Calcium Carbonate Antacid (TUMS E-X SUGAR FREE PO) Take 2 tablets by mouth every morning.      . Cholecalciferol (VITAMIN D) 1000 UNITS capsule Take 1,000 Units by mouth at bedtime.       Marland Kitchen diltiazem (CARDIZEM) 90 MG tablet Take 1 tablet (90 mg total) by mouth 2 (two) times daily.  180 tablet  3  . HYDROcodone-acetaminophen (VICODIN) 5-500 MG per tablet Take 1 tablet by mouth every 4 (four) hours as needed. FOR PAIN      . insulin glargine (LANTUS) 100 UNIT/ML injection Inject 26 Units into the skin daily.      . insulin regular (NOVOLIN R,HUMULIN R) 100 units/mL injection Inject 2-10 Units into the skin 3 (three) times daily before meals. SLIDING SCALE      . lisinopril (  PRINIVIL,ZESTRIL) 5 MG tablet Take 5 mg by mouth at bedtime.      . metoprolol tartrate (LOPRESSOR) 25 MG tablet Take 25 mg by mouth 3 (three) times daily.       . Multiple Vitamins-Minerals (PRESERVISION AREDS 2) CAPS Take 1 capsule by mouth 2 (two) times daily.      . nitroGLYCERIN (NITROSTAT) 0.4 MG SL tablet Place 0.4 mg under the tongue every 5 (five) minutes x 3 doses as needed.      . peg 3350 powder (MOVIPREP) 100 G SOLR Take 1 kit (200 g total) by mouth once.  1 kit  0  . Polyethyl Glycol-Propyl Glycol (SYSTANE OP) Apply 1 drop to eye 2 (two) times daily.      . vitamin C (ASCORBIC ACID) 500 MG tablet Take 1,000 mg by mouth at bedtime.       . vitamin E 1000 UNIT capsule Take 1,000 Units by mouth at bedtime.          Results for orders placed during the hospital encounter of 12/13/13 (from the past 48 hour(s))  GLUCOSE, CAPILLARY     Status: Abnormal   Collection Time    12/13/13 11:22 AM      Result Value Ref Range   Glucose-Capillary 263 (*) 70 - 99 mg/dL   No results found.  ROS  Blood pressure 130/52, pulse 61, temperature 97 F (36.1 C), temperature source Oral, resp. rate 21, SpO2 97.00%. Physical Exam  Constitutional: She appears well-developed and well-nourished.  HENT:  Mouth/Throat: Oropharynx is clear and moist.  Eyes: Conjunctivae are normal. No scleral icterus.  Neck: No thyromegaly present.  Cardiovascular: Normal rate, regular rhythm and normal heart sounds.   No murmur heard. Respiratory: Effort normal and breath sounds normal.  GI: Soft. She exhibits no distension and no mass. There is no tenderness.  Musculoskeletal: She exhibits no edema.  Lymphadenopathy:    She has no cervical adenopathy.  Neurological: She is alert.  Skin: Skin is warm.     Assessment/Plan History of GI bleed while on anticoagulant(Barbara [name] and rectal bleeding). History of colonic adenomas and family history of CRC. Diagnostic EGD and colonoscopy.  Barbara [name] U 12/13/2013, 12:02 PM

## 2013-12-14 ENCOUNTER — Encounter (HOSPITAL_COMMUNITY): Payer: Self-pay | Admitting: Internal Medicine

## 2013-12-14 LAB — H. PYLORI ANTIBODY, IGG: H Pylori IgG: 0.64 {ISR}

## 2013-12-25 ENCOUNTER — Telehealth (INDEPENDENT_AMBULATORY_CARE_PROVIDER_SITE_OTHER): Payer: Self-pay | Admitting: *Deleted

## 2013-12-25 NOTE — Telephone Encounter (Signed)
Barbara [name] said Dr. Laural Golden prescribed Protonix for her. She has a bad heart and the medicine, every time she takes it, makes her heart hurt worse. Would like a call back at [phone].

## 2013-12-25 NOTE — Telephone Encounter (Signed)
Per Dr.Rehman the patient is to stop the Pantoprazole.  Patient was called and made aware.

## 2013-12-27 ENCOUNTER — Telehealth (INDEPENDENT_AMBULATORY_CARE_PROVIDER_SITE_OTHER): Payer: Self-pay | Admitting: *Deleted

## 2013-12-27 NOTE — Telephone Encounter (Signed)
Barbara [name] said Dr. Laural Golden was going to call something in, in the place of Pantoprazol. She was having problems yesterday and is needing something. Please refer to phone message dated 12/25/13: Per Dr.Rehman the patient is to stop the Pantoprazole. The return phone number is  [phone].

## 2013-12-27 NOTE — Telephone Encounter (Signed)
Dr.Rehman - I have looked through the info that we have on the patient. I have also called the pharmacies listed. Both only have Pantoprazole listed , no other PPI's.  What would you like for me to call in?

## 2013-12-28 NOTE — Telephone Encounter (Signed)
A prescription was called to [facility] for the Pepcid 20 mg Take 1 by mouth twice a day #60 with 5 refills. Patient was called and made aware.

## 2013-12-28 NOTE — Telephone Encounter (Signed)
Could try Pepcid 20 mg twice a day; Not be covered by insurance but can call script.

## 2013-12-31 NOTE — Telephone Encounter (Signed)
Patient having side effects with PPIs. She was begun on Pepcid 20 mg by mouth twice a day.

## 2014-01-03 ENCOUNTER — Telehealth: Payer: Self-pay | Admitting: Cardiology

## 2014-01-03 NOTE — Telephone Encounter (Signed)
Beth with Dr.Fagan's office called. Patient's daughter called patient's PCP and stated that they have been requesting a referral with Dr.Klein but we would not do it.  I looked in patient's chart and there is no mention of a referral or no reason for  Referral.  Office note states that patient's a-fib is controlled with meds and as of visit in 6/15 her symptoms had improved.  There is also no telephone note or any other documentation of patient or any family member requesting referral to [place].  Beth with Dr. Ria Comment office has asked that we contact patient for further details in regards to this,. / tgs

## 2014-01-03 NOTE — Telephone Encounter (Signed)
I will forward to Dr.Branch for his advise.

## 2014-01-04 NOTE — Telephone Encounter (Signed)
I am unaware of any such discussion and do not have anything documented. Dr Caryl Comes does not see patients in [facility], only in [place], any consideration for EP evaluation would have been to Dr Lovena Le in our [place] office. Please discuss with family and sort out the confusion, who did she talk to?. If they would like to see EP Dr Lovena Le we can have that arranged. If there is a special preference to see Dr Francena Hanly in [place] we will need to discuss with him for approval.   Zandra Abts MD

## 2014-01-04 NOTE — Telephone Encounter (Signed)
Scheduled apt with Dr.Klein in [place] on 9/29 at 2:30 pm  I spoke with pt,she never mentioned to Dr.Branch about wanting to see Dr.Klein but is appreciative of the apt.She believes her daughter was concerned and wants her to be seen

## 2014-02-19 ENCOUNTER — Ambulatory Visit (INDEPENDENT_AMBULATORY_CARE_PROVIDER_SITE_OTHER): Payer: Medicare Other | Admitting: Internal Medicine

## 2014-02-19 ENCOUNTER — Encounter: Payer: Self-pay | Admitting: Internal Medicine

## 2014-02-19 VITALS — BP 128/52 | HR 57 | Ht 64.0 in | Wt 142.6 lb

## 2014-02-19 DIAGNOSIS — I480 Paroxysmal atrial fibrillation: Secondary | ICD-10-CM

## 2014-02-19 DIAGNOSIS — I4891 Unspecified atrial fibrillation: Secondary | ICD-10-CM

## 2014-02-19 MED ORDER — ASPIRIN 325 MG PO TABS: 162.5000 mg | ORAL_TABLET | Freq: Every day | ORAL | Status: DC

## 2014-02-19 MED ORDER — METOPROLOL TARTRATE 25 MG PO TABS: 50.0000 mg | ORAL_TABLET | Freq: Two times a day (BID) | ORAL | Status: DC

## 2014-02-19 MED ORDER — DILTIAZEM HCL 90 MG PO TABS: 45.0000 mg | ORAL_TABLET | Freq: Two times a day (BID) | ORAL | Status: DC

## 2014-02-19 NOTE — Patient Instructions (Signed)
Your physician has recommended you make the following change in your medication:  1) CHANGE schedule and dosage for Metoprolol. You will take 50 mg Metoprolol twice a day. 2) DECREASE Cardizem to 45 mg twice a day. 3) STOP LISINOPRIL 4) DECREASE Aspirin dosage. Take 1/2 of a 325 mg tablet Aspirin for a dosage of 164 mg daily.  Your physician has requested that you have an adenosine myoview. For further information please visit [email or web address]. Please follow instruction sheet, as given.  Your physician has recommended that you wear an event monitor. Event monitors are medical devices that record the heart's electrical activity. Doctors most often [place] these monitors to diagnose arrhythmias. Arrhythmias are problems with the speed or rhythm of the heartbeat. The monitor is a small, portable device. You can wear one while you do your normal daily activities. This is usually used to diagnose what is causing palpitations/syncope (passing out).  Your physician recommends that you schedule a follow-up appointment in: 8 weeks with Dr. Caryl Comes.

## 2014-02-19 NOTE — Progress Notes (Signed)
ELECTROPHYSIOLOGY CONSULT NOTE  Patient ID: Barbara [name], MRN: [id number], DOB/AGE: 1941/02/22 73 y.o. Admit date: (Not on file) Date of Consult: 02/19/2014  Primary Physician: Asencion Noble, MD Primary Cardiologist:JB  Chief Complaint: Afib   HPI Barbara [name] is a 73 y.o. female  Self referred for question of atrial fibrillation.  She was hospitalized 3/15 with palpitations and chest pain. A diagnosis of atrial fibrillation was made. Anticoagulation was started subsequently discontinued because of bleeding without identifiable source and thought possibly related to hemorrhoids.  Echo 3/15  EF normal with normal LA size  Palpitations and associated with chest discomfort and some lightheadedness. She also has dyspnea on exertion with some peripheral edema.  she smokes.  She has a history of 3 prior strokes. One occurred about 15-20 years ago. Most recent couple years ago.    Past Medical History  Diagnosis Date  . Hypertension   . Hyperlipidemia     total cholesterol of 271 and LDL of 187 in 07/2010  . 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 Macular degeneration   . Diabetes mellitus     A1c of 7.6 in 04/2010; moderate to high dose insulin  . Tobacco abuse   . Asthma     Mild  . Palpitations   . Chest pain   . GERD (gastroesophageal reflux disease)   . Stroke 1993 2008    short term memory loss  . Chronic back pain       Surgical History:  Past Surgical History  Procedure Laterality Date  . Cataract extraction      left  . Appendectomy    . Tubal ligation    . Colonoscopy  2009  . Esophagogastroduodenoscopy  10/21/2011    Procedure: ESOPHAGOGASTRODUODENOSCOPY (EGD);  Surgeon: Rogene Houston, MD;  [facility];  Service: Endoscopy;  Laterality: N/A;  1200  .  Colonoscopy  03/30/2012    Procedure: COLONOSCOPY;  Surgeon: Rogene Houston, MD;  [facility];  Service: Endoscopy;  Laterality: N/A;  730  . Colonoscopy N/A 12/13/2013    Procedure: COLONOSCOPY;  Surgeon: Rogene Houston, MD;  [facility];  Service: Endoscopy;  Laterality: N/A;  1200  . Esophagogastroduodenoscopy N/A 12/13/2013    Procedure: ESOPHAGOGASTRODUODENOSCOPY (EGD);  Surgeon: Rogene Houston, MD;  [facility];  Service: Endoscopy;  Laterality: N/A;     Home Meds: Prior to Admission medications   Medication Sig Start Date End Date Taking? Authorizing Provider  aspirin 325 MG tablet Take 325 mg by mouth daily.   Yes Historical Provider, MD  Bevacizumab (AVASTIN) 100 MG/4ML SOLN Inject 1.25 mg into the vein every 8 (eight) weeks.   Yes Historical Provider, MD  Calcium Carbonate Antacid (TUMS E-X SUGAR FREE PO) Take 2 tablets by mouth every morning.   Yes Historical Provider, MD  Cholecalciferol (VITAMIN D) 1000 UNITS capsule Take 1,000 Units by mouth at bedtime.    Yes Historical Provider, MD  diltiazem (CARDIZEM) 90 MG tablet Take 1 tablet (90 mg total) by mouth 2 (two) times daily. 10/01/13  Yes Arnoldo Lenis, MD  famotidine (PEPCID) 20 MG tablet Take 20 mg by mouth 2 (two) times daily.   Yes Historical Provider, MD  HYDROcodone-acetaminophen (VICODIN) 5-500 MG per tablet Take 1 tablet by mouth every 4 (four)  hours as needed. FOR PAIN   Yes Historical Provider, MD  insulin glargine (LANTUS) 100 UNIT/ML injection Inject 26 Units into the skin daily. 08/16/13  Yes Asencion Noble, MD  insulin regular (NOVOLIN R,HUMULIN R) 100 units/mL injection Inject 2-10 Units into the skin 3 (three) times daily before meals. SLIDING SCALE   Yes Historical Provider, MD  lisinopril (PRINIVIL,ZESTRIL) 5 MG tablet Take 5 mg by mouth at bedtime. 10/01/13  Yes Arnoldo Lenis, MD  metoprolol tartrate (LOPRESSOR) 25 MG tablet Take 25 mg by mouth 3 (three) times daily.    Yes  Historical Provider, MD  Multiple Vitamins-Minerals (PRESERVISION AREDS 2) CAPS Take 1 capsule by mouth 2 (two) times daily.   Yes Historical Provider, MD  nitroGLYCERIN (NITROSTAT) 0.4 MG SL tablet Place 0.4 mg under the tongue every 5 (five) minutes x 3 doses as needed. 06/02/11  Yes Yehuda Savannah, MD  Polyethyl Glycol-Propyl Glycol (SYSTANE OP) Apply 1 drop to eye 2 (two) times daily.   Yes Historical Provider, MD  vitamin C (ASCORBIC ACID) 500 MG tablet Take 1,000 mg by mouth at bedtime.    Yes Historical Provider, MD  vitamin E 1000 UNIT capsule Take 1,000 Units by mouth at bedtime.    Yes Historical Provider, MD      Allergies:  Allergies  Allergen Reactions  . Bee Venom Anaphylaxis  . Penicillins Itching and Rash  . Sulfonamide Derivatives Nausea And Vomiting    History   Social History  . Marital Status: Married    Spouse Name: N/A    Number of Children: 1  . Years of Education: N/A   Occupational History  . retired     Theme park manager   Social History Main Topics  . Smoking status: Current Every Day Smoker -- 0.25 packs/day for 15 years    Types: Cigarettes  . Smokeless tobacco: Never Used     Comment: QUIT SMOKING 6 months ago after smoking20 yrs  . Alcohol Use: No  . Drug Use: No  . Sexual Activity: Not Currently   Other Topics Concern  . Not on file   Social History Narrative  . No narrative on file     History reviewed. No pertinent family history.   ROS:  Please see the history of present illness.     All other systems reviewed and negative.    Physical Exam:   Blood pressure 128/52, pulse 57, height 5\' 4"  (1.626 m), weight 142 lb 9.6 oz (64.683 kg). General: Well developed, well nourished female in no acute distress. Head: Normocephalic, atraumatic, sclera non-icteric, no xanthomas, nares are without discharge. EENT: normal Lymph Nodes:  none Back: without scoliosis/kyphosis , no CVA tendersness Neck: Negative for carotid bruits. JVD not  elevated. Lungs: Clear bilaterally to auscultation without wheezes, rales, or rhonchi. Breathing is unlabored. Heart: RRR with S1 S2.  2/6 systolic  murmur , rubs, or gallops appreciated. Abdomen: Soft, non-tender, non-distended with normoactive bowel sounds. No hepatomegaly. No rebound/guarding. No obvious abdominal masses. Msk:  Strength and tone appear normal for age. Extremities: No clubbing or cyanosis. No  edema.  Distal pedal pulses are 2+ and equal bilaterally. Skin: Warm and Dry Neuro: Alert and oriented X 3. CN III-XII intact Grossly normal sensory and motor function . Psych:  Responds to questions appropriately with a normal affect.      Labs: Cardiac Enzymes No results found for this basename: CKTOTAL, CKMB, TROPONINI,  in the last 72 hours CBC Lab Results  Component Value Date  WBC 8.2 12/05/2013   HGB 12.4 12/05/2013   HCT 36.8 12/05/2013   MCV 87.2 12/05/2013   PLT 236 12/05/2013   PROTIME: No results found for this basename: LABPROT, INR,  in the last 72 hours Chemistry No results found for this basename: NA, K, CL, CO2, BUN, CREATININE, CALCIUM, LABALBU, PROT, BILITOT, ALKPHOS, ALT, AST, GLUCOSE,  in the last 168 hours Lipids Lab Results  Component Value Date   CHOL 231* 08/16/2013   HDL 42 08/16/2013   LDLCALC 158* 08/16/2013   TRIG 155* 08/16/2013   BNP No results found for this basename: probnp   Miscellaneous No results found for this basename: DDIMER    Radiology/Studies:  No results found.  EKG: Sinus rhythm at 57 Intervals 15[birth year]  Old EKGs and in-hospital telemetry were reviewed. No shows atrial fibrillation. All of them show nonsustained atrial tachycardia   Assessment and Plan:  Palpitations with documented nonsustained atrial tachycardia  Prior stroke x3  GI bleeding recent-question hemorrhoidal  Chest pain  Diabetes   tobacco abuse  Orthostatic lightheadedness The first issue is whether or not she has atrial fibrillation as this  would inform anticoagulation recommendations. Nonsustained atrial tachycardia I think is insufficient. We will plan is a 30 day event recorder and this is not illuminating to proceed with an implantable loop recorder.  Given her hypotension, we will hold her lisinopril for now we'll decrease her diltiazem to 45 twice daily and use it as needed for palpitations.  As relates to the potential for antiarrhythmic therapy, and her chest pain syndrome, will undertake Myoview scanning; she cannot tolerate dobutamine in the past we will use a LexiScan She has multiple cardiac risk factors  I've encouraged her to stop smoking.        Virl Axe

## 2014-02-20 ENCOUNTER — Ambulatory Visit (INDEPENDENT_AMBULATORY_CARE_PROVIDER_SITE_OTHER): Payer: Medicare Other | Admitting: *Deleted

## 2014-02-20 ENCOUNTER — Other Ambulatory Visit (HOSPITAL_COMMUNITY): Payer: Self-pay | Admitting: Internal Medicine

## 2014-02-20 ENCOUNTER — Ambulatory Visit (HOSPITAL_COMMUNITY)
Admission: RE | Admit: 2014-02-20 | Discharge: 2014-02-20 | Disposition: A | Discharge status: Home / Self Care | Payer: Medicare Other | Source: Ambulatory Visit | Attending: Internal Medicine | Admitting: Internal Medicine

## 2014-02-20 VITALS — BP 128/58 | Ht 64.0 in | Wt 141.0 lb

## 2014-02-20 DIAGNOSIS — R05 Cough: Secondary | ICD-10-CM | POA: Diagnosis present

## 2014-02-20 DIAGNOSIS — R059 Cough, unspecified: Secondary | ICD-10-CM

## 2014-02-20 DIAGNOSIS — I4891 Unspecified atrial fibrillation: Secondary | ICD-10-CM

## 2014-02-20 DIAGNOSIS — R079 Chest pain, unspecified: Secondary | ICD-10-CM | POA: Diagnosis not present

## 2014-02-20 NOTE — Progress Notes (Signed)
Pt came in for event monitor placement per Dr. Caryl Comes. Pt had no complaints and placement and instructions were explained.

## 2014-02-20 NOTE — Patient Instructions (Addendum)
Thank you for choosing [facility]!!  Dr. Olin Pia office will contact you regarding your event monitor.  I have placed monitor on you today and gave you instructions.

## 2014-02-21 DIAGNOSIS — I480 Paroxysmal atrial fibrillation: Secondary | ICD-10-CM

## 2014-02-26 ENCOUNTER — Ambulatory Visit (HOSPITAL_COMMUNITY): Payer: Medicare Other | Attending: Cardiology | Admitting: Radiology

## 2014-02-26 VITALS — BP 127/58 | HR 57 | Ht 64.0 in | Wt 142.0 lb

## 2014-02-26 DIAGNOSIS — R42 Dizziness and giddiness: Secondary | ICD-10-CM | POA: Diagnosis not present

## 2014-02-26 DIAGNOSIS — I4891 Unspecified atrial fibrillation: Secondary | ICD-10-CM | POA: Insufficient documentation

## 2014-02-26 DIAGNOSIS — E109 Type 1 diabetes mellitus without complications: Secondary | ICD-10-CM | POA: Diagnosis not present

## 2014-02-26 DIAGNOSIS — R002 Palpitations: Secondary | ICD-10-CM | POA: Insufficient documentation

## 2014-02-26 DIAGNOSIS — I2510 Atherosclerotic heart disease of native coronary artery without angina pectoris: Secondary | ICD-10-CM | POA: Insufficient documentation

## 2014-02-26 DIAGNOSIS — R079 Chest pain, unspecified: Secondary | ICD-10-CM | POA: Insufficient documentation

## 2014-02-26 DIAGNOSIS — R0600 Dyspnea, unspecified: Secondary | ICD-10-CM | POA: Diagnosis not present

## 2014-02-26 DIAGNOSIS — I480 Paroxysmal atrial fibrillation: Secondary | ICD-10-CM

## 2014-02-26 DIAGNOSIS — Z794 Long term (current) use of insulin: Secondary | ICD-10-CM | POA: Insufficient documentation

## 2014-02-26 DIAGNOSIS — I10 Essential (primary) hypertension: Secondary | ICD-10-CM | POA: Insufficient documentation

## 2014-02-26 DIAGNOSIS — R0789 Other chest pain: Secondary | ICD-10-CM

## 2014-02-26 MED ORDER — TECHNETIUM TC 99M SESTAMIBI GENERIC - CARDIOLITE
11.0000 | Freq: Once | INTRAVENOUS | Status: AC | PRN
  Administered 2014-02-26: 11 via INTRAVENOUS

## 2014-02-26 MED ORDER — REGADENOSON 0.4 MG/5ML IV SOLN
0.4000 mg | Freq: Once | INTRAVENOUS | Status: AC
  Administered 2014-02-26: 0.4 mg via INTRAVENOUS

## 2014-02-26 MED ORDER — TECHNETIUM TC 99M SESTAMIBI GENERIC - CARDIOLITE
33.0000 | Freq: Once | INTRAVENOUS | Status: AC | PRN
  Administered 2014-02-26: 33 via INTRAVENOUS

## 2014-02-26 NOTE — Progress Notes (Signed)
  [facility] [address] [place], [place] [address] [phone]    Cardiology Nuclear Med Study  Barbara [name] is a 73 y.o. female     MRN : [id number]     DOB: [birth year]  Procedure Date: 02/26/2014  Nuclear Med Background Indication for Stress Test:  Evaluation for Ischemia History:  CAD, MPI 2011 (normal), Asthma, Afib Cardiac Risk Factors: Hypertension and IDDM  Symptoms:  Chest Pain, DOE, Light-Headedness and Palpitations   Nuclear Pre-Procedure Caffeine/Decaff Intake:  None NPO After: 7:30am   Lungs:  clear O2 Sat: 96% on room air. IV 0.9% NS with Angio Cath:  22g  IV Site: R Hand  IV Started by:  Matilde Haymaker, RN  Chest Size (in):  38 Cup Size: B  Height: 5\' 4"  (1.626 m)  Weight:  142 lb (64.411 kg)  BMI:  Body mass index is 24.36 kg/(m^2). Tech Comments:  Lopressor taken at [employer] today    Nuclear Med Study 1 or 2 day study: 1 day  Stress Test Type:  Treadmill/Lexiscan  Reading MD: n/a  Order Authorizing Provider:  Herbert Pun  Resting Radionuclide: Technetium 43m Sestamibi  Resting Radionuclide Dose: 11.0 mCi   Stress Radionuclide:  Technetium 30m Sestamibi  Stress Radionuclide Dose: 33.0 mCi           Stress Protocol Rest HR: 57 Stress HR: 93  Rest BP: 127/58 Stress BP: 131/54  Exercise Time (min): n/a METS: n/a           Dose of Adenosine (mg):  n/a Dose of Lexiscan: 0.4 mg  Dose of Atropine (mg): n/a Dose of Dobutamine: n/a mcg/kg/min (at max HR)  Stress Test Technologist: Glade Lloyd, BS-ES  Nuclear Technologist:  Earl Many, CNMT     Rest Procedure:  Myocardial perfusion imaging was performed at rest 45 minutes following the intravenous administration of Technetium 65m Sestamibi. Rest ECG: Normal sinus rhythm. Nonspecific ST flattening.  Stress Procedure:  The patient received IV Lexiscan 0.4 mg over 15-seconds with concurrent low level exercise and then Technetium 70m Sestamibi was injected at  30-seconds while the patient continued walking one more minute.  Quantitative spect images were obtained after a 45-minute delay.  During the infusion of Lexiscan the patient complained of SOB and chest heaviness.  These symptoms subsided in recovery.  Stress ECG: No significant change from baseline ECG  QPS Raw Data Images:  Normal; no motion artifact; normal heart/lung ratio. Stress Images:  There is scattered areas of slight decreased activity. This is insignificant. There is mild breast attenuation. Rest Images:  Rest images are the same as the stress images. Subtraction (SDS):  No evidence of ischemia. Transient Ischemic Dilatation (Normal <1.22):  1.06 Lung/Heart Ratio (Normal <0.45):  0.34  Quantitative Gated Spect Images QGS EDV:  51 ml QGS ESV:  15 ml  Impression Exercise Capacity:  Lexiscan with low level exercise. BP Response:  Normal blood pressure response. Clinical Symptoms:  Chest heaviness ECG Impression:  No significant ST segment change suggestive of ischemia. Comparison with Prior Nuclear Study: No images to compare  Overall Impression:  Normal stress nuclear study. There is no scar or ischemia. This is a low risk scan.  LV Ejection Fraction: 70%.  LV Wall Motion:  Normal Wall Motion.  Dola Argyle , MD

## 2014-03-06 ENCOUNTER — Other Ambulatory Visit: Payer: Self-pay

## 2014-03-06 MED ORDER — METOPROLOL TARTRATE 25 MG PO TABS: 50.0000 mg | ORAL_TABLET | Freq: Two times a day (BID) | ORAL | Status: DC

## 2014-03-07 ENCOUNTER — Encounter: Payer: Self-pay | Admitting: Internal Medicine

## 2014-03-07 NOTE — Telephone Encounter (Signed)
New message    Patient calling today stating someone just called her home did not leave a message .

## 2014-03-07 NOTE — Telephone Encounter (Signed)
This encounter was created in error - please disregard.

## 2014-03-08 ENCOUNTER — Other Ambulatory Visit: Payer: Self-pay

## 2014-03-08 MED ORDER — METOPROLOL TARTRATE 50 MG PO TABS: 50.0000 mg | ORAL_TABLET | Freq: Two times a day (BID) | ORAL | Status: DC

## 2014-03-25 ENCOUNTER — Telehealth: Payer: Self-pay | Admitting: *Deleted

## 2014-03-25 NOTE — Telephone Encounter (Signed)
Received EOS report from event monitor. Faxed to [address] office and will also have DOD sign so that it can be scanned to pt chart.

## 2014-03-28 ENCOUNTER — Other Ambulatory Visit: Payer: Self-pay | Admitting: *Deleted

## 2014-03-28 DIAGNOSIS — I480 Paroxysmal atrial fibrillation: Secondary | ICD-10-CM

## 2014-04-15 ENCOUNTER — Ambulatory Visit (INDEPENDENT_AMBULATORY_CARE_PROVIDER_SITE_OTHER): Payer: Medicare Other | Admitting: Internal Medicine

## 2014-04-15 ENCOUNTER — Encounter: Payer: Self-pay | Admitting: Internal Medicine

## 2014-04-15 VITALS — BP 142/60 | HR 59 | Ht 64.0 in | Wt 144.4 lb

## 2014-04-15 DIAGNOSIS — I10 Essential (primary) hypertension: Secondary | ICD-10-CM

## 2014-04-15 DIAGNOSIS — R42 Dizziness and giddiness: Secondary | ICD-10-CM

## 2014-04-15 DIAGNOSIS — Z8673 Personal history of transient ischemic attack (TIA), and cerebral infarction without residual deficits: Secondary | ICD-10-CM

## 2014-04-15 DIAGNOSIS — I471 Supraventricular tachycardia: Secondary | ICD-10-CM

## 2014-04-15 DIAGNOSIS — Z8719 Personal history of other diseases of the digestive system: Secondary | ICD-10-CM

## 2014-04-15 MED ORDER — APIXABAN 5 MG PO TABS
5.0000 mg | ORAL_TABLET | Freq: Two times a day (BID) | ORAL | Status: DC
Start: 2014-04-15 — End: 2015-02-04

## 2014-04-15 MED ORDER — APIXABAN 5 MG PO TABS: 5.0000 mg | ORAL_TABLET | Freq: Two times a day (BID) | ORAL | Status: DC

## 2014-04-15 NOTE — Patient Instructions (Signed)
Your physician has recommended you make the following change in your medication:  1) STOP Aspirin 2) START Eliquis 5 mg twice daily  Your physician wants you to follow-up in: 6 months with Dr. Caryl Comes. You will receive a reminder letter in the mail two months in advance. If you don't receive a letter, please call our office to schedule the follow-up appointment.

## 2014-04-15 NOTE — Progress Notes (Signed)
Patient Care Team: Asencion Noble, MD as PCP - General (Internal Medicine) Arnoldo Lenis, MD as Consulting Physician (Cardiology)   HPI  Barbara [name] is a 73 y.o. female Seen in follow-up for atrial fibrillation.  She had documented nonsustained atrial tachycardia but no atrial fibrillation. We elected to undertake a 30 day event recorder.  This was done in [place]. We do not have it available.  She has a history of prior strokes.  We undertook a Myoview scan to help inform antiarrhythmic drug decisions; 10/15 this was normal.   History of orthostatic hypotension and hypertension. Her blood pressures are much better.  Past Medical History  Diagnosis Date  . Hypertension   . Hyperlipidemia     total cholesterol of 271 and LDL of 187 in 07/2010  . 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 Macular degeneration   . Diabetes mellitus     A1c of 7.6 in 04/2010; moderate to high dose insulin  . Tobacco abuse   . Asthma     Mild  . Palpitations   . Chest pain   . GERD (gastroesophageal reflux disease)   . Stroke 1993 2008    short term memory loss  . Chronic back pain     Past Surgical History  Procedure Laterality Date  . Cataract extraction      left  . Appendectomy    . Tubal ligation    . Colonoscopy  2009  . Esophagogastroduodenoscopy  10/21/2011    Procedure: ESOPHAGOGASTRODUODENOSCOPY (EGD);  Surgeon: Rogene Houston, MD;  [facility];  Service: Endoscopy;  Laterality: N/A;  1200  . Colonoscopy  03/30/2012    Procedure: COLONOSCOPY;  Surgeon: Rogene Houston, MD;  [facility];  Service: Endoscopy;  Laterality: N/A;  730  . Colonoscopy N/A 12/13/2013    Procedure: COLONOSCOPY;  Surgeon: Rogene Houston, MD;  [facility];  Service:  Endoscopy;  Laterality: N/A;  1200  . Esophagogastroduodenoscopy N/A 12/13/2013    Procedure: ESOPHAGOGASTRODUODENOSCOPY (EGD);  Surgeon: Rogene Houston, MD;  [facility];  Service: Endoscopy;  Laterality: N/A;    Current Outpatient Prescriptions  Medication Sig Dispense Refill  . aspirin 325 MG tablet Take 0.5 tablets (162.5 mg total) by mouth daily. 30 tablet 15  . Bevacizumab (AVASTIN) 100 MG/4ML SOLN Inject 1.25 mg into the vein every 8 (eight) weeks.    . Calcium Carbonate Antacid (TUMS E-X SUGAR FREE PO) Take 2 tablets by mouth every morning.    . Cholecalciferol (VITAMIN D) 1000 UNITS capsule Take 1,000 Units by mouth at bedtime.     Marland Kitchen diltiazem (CARDIZEM) 90 MG tablet Take 0.5 tablets (45 mg total) by mouth 2 (two) times daily. 90 tablet 0  . famotidine (PEPCID) 20 MG tablet Take 20 mg by mouth 2 (two) times daily.    Marland Kitchen HYDROcodone-acetaminophen (VICODIN) 5-500 MG per tablet Take 1 tablet by mouth every 4 (four) hours as needed. FOR PAIN    . insulin glargine (LANTUS) 100 UNIT/ML injection Inject 28 Units into the skin daily.     . insulin regular (NOVOLIN R,HUMULIN R) 100 units/mL injection Inject 2-10 Units into the skin 3 (three) times daily before meals. SLIDING SCALE    . metoprolol (LOPRESSOR) 50 MG tablet Take 1 tablet (  50 mg total) by mouth 2 (two) times daily. 180 tablet 3  . Multiple Vitamins-Minerals (PRESERVISION AREDS 2) CAPS Take 1 capsule by mouth 2 (two) times daily.    . nitroGLYCERIN (NITROSTAT) 0.4 MG SL tablet Place 0.4 mg under the tongue every 5 (five) minutes x 3 doses as needed.    Vladimir Faster Glycol-Propyl Glycol (SYSTANE OP) Apply 1 drop to eye 2 (two) times daily.    . vitamin C (ASCORBIC ACID) 500 MG tablet Take 1,000 mg by mouth at bedtime.     . vitamin E 1000 UNIT capsule Take 1,000 Units by mouth at bedtime.      No current facility-administered medications for this visit.    Allergies  Allergen Reactions  . Bee Venom Anaphylaxis  .  Penicillins Itching and Rash  . Sulfonamide Derivatives Nausea And Vomiting    Review of Systems negative except from HPI and PMH  Physical Exam BP 142/60 mmHg  Pulse 59  Ht 5\' 4"  (1.626 m)  Wt 65.499 kg (144 lb 6.4 oz)  BMI 24.77 kg/m2 Well developed and well nourished in no acute distress HENT normal E scleral and icterus clear Neck Supple JVP flat; carotids brisk and full Clear to ausculation *Regular rate and rhythm, no murmurs gallops or rub Soft with active bowel sounds No clubbing cyanosis  Edema Alert and oriented, grossly normal motor and sensory function Skin Warm and Dry  ECG demonstrates sinus rhythm at 59 Intervals 13/07/44 Axis is 60 Nonspecific ST changes   Assessment and  Plan  Atrial fibrillation/complex atrial ectopy  Hypertension  Orthostatic lightheadedness  History of GI bleeding  Overall blood pressure control is much better. She is having less dizziness also.  There've been recent papers raising the concern that atrial ectopy by itself may be a marker for increased risk of stroke. Given the prior concerns of atrial fibrillation and her high CHADS-VASc score (greater than or equal to 4) we will stop her aspirin she previously did not tolerateand put her on apixaban. We have reviewed bleeding risks.

## 2014-04-29 ENCOUNTER — Other Ambulatory Visit: Payer: Self-pay | Admitting: *Deleted

## 2014-04-29 MED ORDER — DILTIAZEM HCL 90 MG PO TABS: 45.0000 mg | ORAL_TABLET | Freq: Two times a day (BID) | ORAL | Status: DC

## 2014-05-21 ENCOUNTER — Telehealth: Payer: Self-pay | Admitting: *Deleted

## 2014-05-21 ENCOUNTER — Encounter (INDEPENDENT_AMBULATORY_CARE_PROVIDER_SITE_OTHER): Payer: Self-pay

## 2014-05-21 NOTE — Telephone Encounter (Signed)
Eliquis approved on Tier 3 level through 04/24/2015.

## 2015-01-09 ENCOUNTER — Ambulatory Visit: Payer: Self-pay | Admitting: Internal Medicine

## 2015-02-04 ENCOUNTER — Ambulatory Visit (INDEPENDENT_AMBULATORY_CARE_PROVIDER_SITE_OTHER): Payer: Medicare Other | Admitting: Internal Medicine

## 2015-02-04 ENCOUNTER — Encounter: Payer: Self-pay | Admitting: Internal Medicine

## 2015-02-04 ENCOUNTER — Other Ambulatory Visit: Payer: Self-pay

## 2015-02-04 VITALS — BP 140/70 | HR 66 | Ht 64.0 in | Wt 153.2 lb

## 2015-02-04 DIAGNOSIS — I480 Paroxysmal atrial fibrillation: Secondary | ICD-10-CM | POA: Diagnosis not present

## 2015-02-04 NOTE — Progress Notes (Signed)
Patient Care Team: Asencion Noble, MD as PCP - General (Internal Medicine) Arnoldo Lenis, MD as Consulting Physician (Cardiology)   HPI  Barbara [name] is a 74 y.o. female Seen in follow-up for atrial fibrillation.  She had documented nonsustained atrial tachycardia but no atrial fibrillation. We elected to undertake a 30 day event recorder.  This was done in [place]. We do not have it available.  She has a history of prior strokes.  We undertook a Myoview scan to help inform antiarrhythmic drug decisions; 10/15 this was normal.   History of orthostatic hypotension and hypertension. Her blood pressures are much better.  Past Medical History  Diagnosis Date  . Hypertension   . Hyperlipidemia     total cholesterol of 271 and LDL of 187 in 07/2010  . 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 Macular degeneration   . Diabetes mellitus     A1c of 7.6 in 04/2010; moderate to high dose insulin  . Tobacco abuse   . Asthma     Mild  . Palpitations   . Chest pain   . GERD (gastroesophageal reflux disease)   . Stroke 1993 2008    short term memory loss  . Chronic back pain     Past Surgical History  Procedure Laterality Date  . Cataract extraction      left  . Appendectomy    . Tubal ligation    . Colonoscopy  2009  . Esophagogastroduodenoscopy  10/21/2011    Procedure: ESOPHAGOGASTRODUODENOSCOPY (EGD);  Surgeon: Rogene Houston, MD;  [facility];  Service: Endoscopy;  Laterality: N/A;  1200  . Colonoscopy  03/30/2012    Procedure: COLONOSCOPY;  Surgeon: Rogene Houston, MD;  [facility];  Service: Endoscopy;  Laterality: N/A;  730  . Colonoscopy N/A 12/13/2013    Procedure: COLONOSCOPY;  Surgeon: Rogene Houston, MD;  [facility];  Service:  Endoscopy;  Laterality: N/A;  1200  . Esophagogastroduodenoscopy N/A 12/13/2013    Procedure: ESOPHAGOGASTRODUODENOSCOPY (EGD);  Surgeon: Rogene Houston, MD;  [facility];  Service: Endoscopy;  Laterality: N/A;    Current Outpatient Prescriptions  Medication Sig Dispense Refill  . Bevacizumab (AVASTIN) 100 MG/4ML SOLN Inject 1.25 mg into the vein every 8 (eight) weeks.    . brimonidine (ALPHAGAN) 0.2 % ophthalmic solution Place 1 drop into the right eye 2 (two) times daily.  1  . Calcium Carbonate Antacid (TUMS E-X SUGAR FREE PO) Take 2 tablets by mouth every morning.    . Cholecalciferol (VITAMIN D) 1000 UNITS capsule Take 1,000 Units by mouth at bedtime.     Marland Kitchen diltiazem (CARDIZEM) 90 MG tablet Take 0.5 tablets (45 mg total) by mouth 2 (two) times daily. 90 tablet 2  . famotidine (PEPCID) 20 MG tablet Take 20 mg by mouth 2 (two) times daily.    Marland Kitchen HYDROcodone-acetaminophen (VICODIN) 5-500 MG per tablet Take 1 tablet by mouth every 4 (four) hours as needed. FOR PAIN    . insulin glargine (LANTUS) 100 UNIT/ML injection Inject 25 Units into the skin daily.     . insulin regular (NOVOLIN R,HUMULIN R) 100 units/mL injection Inject 2-10 Units into the skin 3 (three) times daily before meals. SLIDING SCALE    . metoprolol tartrate (LOPRESSOR) 25 MG  tablet Take 50 mg by mouth 2 (two) times daily.    . Multiple Vitamins-Minerals (PRESERVISION AREDS 2) CAPS Take 1 capsule by mouth 2 (two) times daily.    . nitroGLYCERIN (NITROSTAT) 0.4 MG SL tablet Place 0.4 mg under the tongue every 5 (five) minutes x 3 doses as needed.    Vladimir Faster Glycol-Propyl Glycol (SYSTANE OP) Apply 1 drop to eye 2 (two) times daily.    . vitamin C (ASCORBIC ACID) 500 MG tablet Take 1,000 mg by mouth at bedtime.     . vitamin E 1000 UNIT capsule Take 1,000 Units by mouth at bedtime.      No current facility-administered medications for this visit.    Allergies  Allergen Reactions  . Bee Venom Anaphylaxis  .  Penicillins Itching and Rash  . Sulfonamide Derivatives Nausea And Vomiting    Review of Systems negative except from HPI and PMH  Physical Exam BP 140/70 mmHg  Pulse 66  Ht 5\' 4"  (1.626 m)  Wt 153 lb 3.2 oz (69.491 kg)  BMI 26.28 kg/m2 Well developed and well nourished in no acute distress HENT normal E scleral and icterus clear Neck Supple JVP flat; carotids brisk and full Clear to ausculation *Regular rate and rhythm, no murmurs gallops or rub Soft with active bowel sounds No clubbing cyanosis  Edema Alert and oriented, grossly normal motor and sensory function Skin Warm and Dry  ECG demonstrates sinus rhythm at 66 Intervals 14/07/41 Axis LXX PACs  Assessment and  Plan  Atrial fibrillation/complex atrial ectopy  Hypertension  Sinus brdycardia  Orthostatic lightheadedness  History of GI bleeding recurrent on apixoban  eval neg  Overall blood pressure control is much better. She is having less dizziness also.  Blood pressure is reasonably controlled in the context of her diabetes  She's having recurrent ectopy. She is currently not on anticoagulation. GI bleeding recurred although the evaluation was negative.  If her arrhythmias persist, given her bradycardia, antiarrhythmics therapy may be our next best option.  She continues to smoke. We discussed the state program at [place]   in the adjunctive use of lozenges and oral nicotine  She is not able to exercise because of her back.

## 2015-02-04 NOTE — Patient Instructions (Signed)
Medication Instructions: - no changes  Labwork: - none  Procedures/Testing: - none  Follow-Up: - Your physician wants you to follow-up in: 1 year with Dr. Klein You will receive a reminder letter in the mail two months in advance. If you don't receive a letter, please call our office to schedule the follow-up appointment.  Any Additional Special Instructions Will Be Listed Below (If Applicable).   

## 2015-02-28 ENCOUNTER — Other Ambulatory Visit: Payer: Self-pay

## 2015-02-28 MED ORDER — DILTIAZEM HCL 90 MG PO TABS: 45.0000 mg | ORAL_TABLET | Freq: Two times a day (BID) | ORAL | Status: DC

## 2015-02-28 NOTE — Telephone Encounter (Signed)
Barbara Sprang, MD at 02/04/2015 8:40 AM  diltiazem (CARDIZEM) 90 MG tabletTake 0.5 tablets (45 mg total) by mouth 2 (two) times daily Patient Instructions     Medication Instructions: - no changes

## 2015-04-03 ENCOUNTER — Other Ambulatory Visit (INDEPENDENT_AMBULATORY_CARE_PROVIDER_SITE_OTHER): Payer: Self-pay | Admitting: Internal Medicine

## 2015-05-07 ENCOUNTER — Other Ambulatory Visit: Payer: Self-pay

## 2015-05-07 MED ORDER — METOPROLOL TARTRATE 25 MG PO TABS: 50.0000 mg | ORAL_TABLET | Freq: Two times a day (BID) | ORAL | Status: DC

## 2015-06-30 DIAGNOSIS — E119 Type 2 diabetes mellitus without complications: Secondary | ICD-10-CM | POA: Diagnosis not present

## 2015-07-24 ENCOUNTER — Telehealth: Payer: Self-pay | Admitting: Internal Medicine

## 2015-07-24 DIAGNOSIS — H35721 Serous detachment of retinal pigment epithelium, right eye: Secondary | ICD-10-CM | POA: Diagnosis not present

## 2015-07-24 DIAGNOSIS — H40003 Preglaucoma, unspecified, bilateral: Secondary | ICD-10-CM | POA: Diagnosis not present

## 2015-07-24 DIAGNOSIS — H353231 Exudative age-related macular degeneration, bilateral, with active choroidal neovascularization: Secondary | ICD-10-CM | POA: Diagnosis not present

## 2015-07-24 DIAGNOSIS — Z961 Presence of intraocular lens: Secondary | ICD-10-CM | POA: Diagnosis not present

## 2015-07-24 NOTE — Telephone Encounter (Signed)
I called and spoke with the patient. No DPR on file to speak with Manuela Schwartz. Per the patient, she states that it is not uncommon for BP/ HR to flucuate. Her SBP can run from 70-145. Her HR can run from 47-75 bpm. I advised the patient if she is having some premature beats, that her HR may not truly be as slow as is showing on an automatic cuff. She does report feeling her heart skipping at times. She reports feeling well now, but will have some occasional dizziness. Her BP/ HR when she got home from the eye doctor's office was 116/64 & 75 bpm. I advised we will see her at her office visit with Estella Husk, PA on Monday (3/6) as scheduled. I have asked her to bring her BP/ HR readings with her from home. She is agreeable.

## 2015-07-24 NOTE — Telephone Encounter (Signed)
NEw MEssage  Pt dtr requested to spek w/ RN concerning pt's BP and HR- at [facility] surgeon today 3/2 was recorded at 70/30 p 40. Pt was made appt for 3/6 w/ Michele @ 1245. Please call back and discuss.

## 2015-07-28 ENCOUNTER — Encounter: Payer: Self-pay | Admitting: Physician Assistant

## 2015-07-28 ENCOUNTER — Ambulatory Visit (INDEPENDENT_AMBULATORY_CARE_PROVIDER_SITE_OTHER): Payer: Medicare Other | Admitting: Physician Assistant

## 2015-07-28 ENCOUNTER — Ambulatory Visit (INDEPENDENT_AMBULATORY_CARE_PROVIDER_SITE_OTHER): Payer: Medicare Other

## 2015-07-28 VITALS — BP 110/50 | HR 68 | Ht 64.0 in | Wt 154.0 lb

## 2015-07-28 DIAGNOSIS — I495 Sick sinus syndrome: Secondary | ICD-10-CM

## 2015-07-28 DIAGNOSIS — R42 Dizziness and giddiness: Secondary | ICD-10-CM

## 2015-07-28 DIAGNOSIS — I951 Orthostatic hypotension: Secondary | ICD-10-CM

## 2015-07-28 DIAGNOSIS — I4891 Unspecified atrial fibrillation: Secondary | ICD-10-CM

## 2015-07-28 DIAGNOSIS — E785 Hyperlipidemia, unspecified: Secondary | ICD-10-CM | POA: Diagnosis not present

## 2015-07-28 LAB — TSH: TSH: 6.81 m[IU]/L — AB

## 2015-07-28 LAB — COMPREHENSIVE METABOLIC PANEL
ALK PHOS: 65 U/L (ref 33–130)
ALT: 12 U/L (ref 6–29)
AST: 18 U/L (ref 10–35)
Albumin: 4 g/dL (ref 3.6–5.1)
BUN: 16 mg/dL (ref 7–25)
CO2: 26 mmol/L (ref 20–31)
Calcium: 9.1 mg/dL (ref 8.6–10.4)
Chloride: 104 mmol/L (ref 98–110)
Creat: 0.97 mg/dL — ABNORMAL HIGH (ref 0.60–0.93)
GLUCOSE: 41 mg/dL — AB (ref 65–99)
POTASSIUM: 4.3 mmol/L (ref 3.5–5.3)
SODIUM: 141 mmol/L (ref 135–146)
Total Bilirubin: 0.3 mg/dL (ref 0.2–1.2)
Total Protein: 6.5 g/dL (ref 6.1–8.1)

## 2015-07-28 LAB — CBC
HEMATOCRIT: 40.8 % (ref 36.0–46.0)
Hemoglobin: 13.5 g/dL (ref 12.0–15.0)
MCH: 29.9 pg (ref 26.0–34.0)
MCHC: 33.1 g/dL (ref 30.0–36.0)
MCV: 90.3 fL (ref 78.0–100.0)
MPV: 11.6 fL (ref 8.6–12.4)
Platelets: 280 10*3/uL (ref 150–400)
RBC: 4.52 MIL/uL (ref 3.87–5.11)
RDW: 13.3 % (ref 11.5–15.5)
WBC: 10.7 10*3/uL — ABNORMAL HIGH (ref 4.0–10.5)

## 2015-07-28 LAB — FERRITIN: Ferritin: 29 ng/mL (ref 20–288)

## 2015-07-28 NOTE — Assessment & Plan Note (Signed)
Looks like patient is going in and out of atrial fibrillation. Patient has had 2 GI bleeds in the past so can't take anticoagulation. Placing 48 hour monitor. She is currently on metoprolol 50 mg twice a day and diltiazem 45 mg twice a day.  This patients CHA2DS2-VASc Score and unadjusted Ischemic Stroke Rate (% per year) is equal to 9.7 % stroke rate/year from a score of 6  Above score calculated as 1 point each if present [CHF, HTN, DM, Vascular=MI/PAD/Aortic Plaque, Age if 65-74, or Female] Above score calculated as 2 points each if present [Age > 75, or Stroke/TIA/TE]

## 2015-07-28 NOTE — Assessment & Plan Note (Signed)
Patient has had increase in dizziness and palpitations. It's like she is going in and out of atrial fibrillation and has had slow heart rates and blood pressures. We'll place a 48 hour monitor on the patient and get her into see Dr. Caryl Comes to discuss further antiarrhythmics versus possible pacemaker. Also checking labs including TSH. Has been followed by Dr. Willey Blade and told she was borderline.

## 2015-07-28 NOTE — Assessment & Plan Note (Signed)
Patient is orthostatic in the office today. She is drinking a lot of fluids and staying hydrated. She is only on low-dose diltiazem and metoprolol. She needs both these for her tachyarrhythmias. We'll hold off on decreasing these until we get the monitor and have her see Dr. Caryl Comes back in the next 1-2 days.

## 2015-07-28 NOTE — Progress Notes (Signed)
Cardiology Office Note   Date:  07/28/2015   ID:  Barbara [name], Barbara [name] [birth year], MRN [id number]  PCP:  Asencion Noble, MD  Cardiologist: Dr. Harl Bowie EPS Dr. Caryl Comes  Chief Complaint: Dizziness    History of Present Illness: Barbara [name] is a 75 y.o. female who presents for fluctuating blood pressure and pulse. She has a history of paroxysmal atrial fibrillation, sinus bradycardia orthostatic hypotension.Chadsvasc score=6 She is not anticoagulated because of recurrent GI bleeding on abixoban. She last saw Dr. Caryl Comes in 01/2015 at which time he felt antiarrhythmic therapy may be of best option if her arrhythmias persist given her bradycardia. Myoview in 02/2014 was normal. She had nonobstructive CAD on cath in 2001. She also has hypertension, DM, history of CVA and 93 and 08, and hyperlipidemia.  Patient complains of worsening dizziness and blood pressure fluctuations. She was at the eye doctor last week and her blood pressure was 70/30 with a pulse of 40. She hasn't passed out but knows when she is in atrial fibrillation because she gets a severe headache. She takes her blood pressure daily and her pulses have ranged from 38-88. Blood pressures are also fluctuating. She got up to go the bathroom the other night and was extremely dizzy and had to lean over the sink to steady herself. She denies syncope.    Past Medical History  Diagnosis Date  . Hypertension   . Hyperlipidemia     total cholesterol of 271 and LDL of 187 in 07/2010  . 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 Macular degeneration   . Diabetes mellitus     A1c of 7.6 in 04/2010; moderate to high dose insulin  . Tobacco abuse   . Asthma     Mild  . Palpitations   . Chest pain   . GERD (gastroesophageal reflux disease)   . Stroke [facility]) 1993 2008     short term memory loss  . Chronic back pain     Past Surgical History  Procedure Laterality Date  . Cataract extraction      left  . Appendectomy    . Tubal ligation    . Colonoscopy  2009  . Esophagogastroduodenoscopy  10/21/2011    Procedure: ESOPHAGOGASTRODUODENOSCOPY (EGD);  Surgeon: Rogene Houston, MD;  [facility];  Service: Endoscopy;  Laterality: N/A;  1200  . Colonoscopy  03/30/2012    Procedure: COLONOSCOPY;  Surgeon: Rogene Houston, MD;  [facility];  Service: Endoscopy;  Laterality: N/A;  730  . Colonoscopy N/A 12/13/2013    Procedure: COLONOSCOPY;  Surgeon: Rogene Houston, MD;  [facility];  Service: Endoscopy;  Laterality: N/A;  1200  . Esophagogastroduodenoscopy N/A 12/13/2013    Procedure: ESOPHAGOGASTRODUODENOSCOPY (EGD);  Surgeon: Rogene Houston, MD;  [facility];  Service: Endoscopy;  Laterality: N/A;     Current Outpatient Prescriptions  Medication Sig Dispense Refill  . Bevacizumab (AVASTIN) 100 MG/4ML SOLN Inject 1.25 mg into the vein every 8 (eight) weeks.    . brimonidine (ALPHAGAN) 0.2 % ophthalmic solution Place 1 drop into the right eye 2 (two) times daily.  1  . Calcium Carbonate Antacid (TUMS E-X SUGAR FREE PO) Take 2 tablets by mouth every morning.    . Cholecalciferol (VITAMIN D) 1000 UNITS capsule Take 1,000 Units by mouth  at bedtime.     Marland Kitchen diltiazem (CARDIZEM) 90 MG tablet Take 0.5 tablets (45 mg total) by mouth 2 (two) times daily. 90 tablet 2  . famotidine (PEPCID) 20 MG tablet take 1 tablet by mouth twice a day 60 tablet 5  . HYDROcodone-acetaminophen (VICODIN) 5-500 MG per tablet Take 1 tablet by mouth every 4 (four) hours as needed. FOR PAIN    . insulin glargine (LANTUS) 100 UNIT/ML injection Inject 26 Units into the skin daily.     . insulin regular (NOVOLIN R,HUMULIN R) 100 units/mL injection Inject 2-10 Units into the skin 3 (three) times daily before meals. SLIDING SCALE    . metoprolol  tartrate (LOPRESSOR) 25 MG tablet Take 2 tablets (50 mg total) by mouth 2 (two) times daily. 360 tablet 3  . Multiple Vitamins-Minerals (PRESERVISION AREDS 2) CAPS Take 1 capsule by mouth 2 (two) times daily.    . nitroGLYCERIN (NITROSTAT) 0.4 MG SL tablet Place 0.4 mg under the tongue every 5 (five) minutes x 3 doses as needed for chest pain.     Vladimir Faster Glycol-Propyl Glycol (SYSTANE OP) Apply 1 drop to eye 2 (two) times daily.    . vitamin C (ASCORBIC ACID) 500 MG tablet Take 1,000 mg by mouth at bedtime.     . vitamin E 1000 UNIT capsule Take 1,000 Units by mouth at bedtime.      No current facility-administered medications for this visit.    Allergies:   Bee venom; Sulfamethoxazole; Penicillins; and Sulfonamide derivatives    Social History:  The patient  reports that she has been smoking Cigarettes.  She has a 3.75 pack-year smoking history. She has never used smokeless tobacco. She reports that she does not drink alcohol or use illicit drugs.   Family History:  The patient's    family history includes Cancer in her brother, father, sister, sister, and sister; Heart attack in her brother and mother.    ROS:  Please see the history of present illness.   Otherwise, review of systems are positive for decreased appetite, visual changes when heart is to slow, some nausea, anxiety, chronic back pain balance issues easy bruising.   All other systems are reviewed and negative.    PHYSICAL EXAM: VS:  BP 110/50 mmHg  Pulse 68  Ht 5\' 4"  (1.626 m)  Wt 154 lb (69.854 kg)  BMI 26.42 kg/m2 , BMI Body mass index is 26.42 kg/(m^2). GEN: Well nourished, well developed, in no acute distress Neck: no JVD, HJR, carotid bruits, or masses Cardiac: Irregular irregular; 2/6 systolic murmur at the left sternal border, no gallop, rubs, thrill or heave,  Respiratory:  clear to auscultation bilaterally, normal work of breathing GI: soft, nontender, nondistended, + BS MS: no deformity or  atrophy Extremities: without cyanosis, clubbing, edema, good distal pulses bilaterally.  Skin: warm and dry, no rash Neuro:  Strength and sensation are intact    EKG:  EKG is ordered today. The ekg ordered today demonstrates several EKGs were done today some look like sinus rhythm with sinus tachycardia but others look like atrial fibrillation at 90 bpm. EKG is reviewed with Dr. Meda Coffee who concurs.   Recent Labs: No results found for requested labs within last 365 days.    Lipid Panel    Component Value Date/Time   CHOL 231* 08/16/2013 0412   TRIG 155* 08/16/2013 0412   HDL 42 08/16/2013 0412   CHOLHDL 5.5 08/16/2013 0412   VLDL 31 08/16/2013 0412   LDLCALC 158*  08/16/2013 0412      Wt Readings from Last 3 Encounters:  07/28/15 154 lb (69.854 kg)  02/04/15 153 lb 3.2 oz (69.491 kg)  04/15/14 144 lb 6.4 oz (65.499 kg)      Other studies Reviewed: Additional studies/ records that were reviewed today include and review of the records demonstrates:  2Decho 08/15/13: Study Conclusions  - Procedure narrative: Transthoracic echocardiography. Image   quality was suboptimal. The study was technically   difficult, as a result of poor sound wave transmission. - Left ventricle: The cavity size was normal. Wall thickness   was increased in a pattern of mild LVH. Systolic function   was normal. The estimated ejection fraction was in the   range of 60% to 65%. Wall motion was normal; there were no   regional wall motion abnormalities. The study was not   technically sufficient to allow evaluation of LV diastolic   dysfunction due to atrial fibrillation. - Mitral valve: Mildly thickened leaflets . Mild   regurgitation  RESULTS:   The left main coronary artery had a 20% discrete stenosis.   Left anterior descending artery had 20% multiple discrete lesions in the proximal and mid portion.  Distal vessel had a 40% discrete lesion.   The circumflex coronary artery had 30%  multiple discrete lesions in the mid and distal portion.   Right coronary artery was dominant.  There were 30% multiple discrete lesions in the proximal, mid, and distal vessel.  The PDA had 20% multiple discrete lesions.   RAO VENTRICULOGRAPHY:  RAO ventriculography showed hyperdynamic LV function. The ejection fraction was in excess of 80%.  There was no gradient across the aortic valve and no MR.  Aortic pressure was 110/50.  LV pressure was 110/4.   IMPRESSION:  The patients chest pain would appear to be noncardiac in etiology.  She will be discharged home later today to follow up with Dr. Willey Blade. DD:  04/21/00 TD:  04/21/00 Job: [id number]     ASSESSMENT AND PLAN:  Dizziness Patient has had increase in dizziness and palpitations. It's like she is going in and out of atrial fibrillation and has had slow heart rates and blood pressures. We'll place a 48 hour monitor on the patient and get her into see Dr. Caryl Comes to discuss further antiarrhythmics versus possible pacemaker. Also checking labs including TSH. Has been followed by Dr. Willey Blade and told she was borderline.  Atrial fibrillation with RVR Looks like patient is going in and out of atrial fibrillation. Patient has had 2 GI bleeds in the past so can't take anticoagulation. Placing 48 hour monitor. She is currently on metoprolol 50 mg twice a day and diltiazem 45 mg twice a day.  This patients CHA2DS2-VASc Score and unadjusted Ischemic Stroke Rate (% per year) is equal to 9.7 % stroke rate/year from a score of 6  Above score calculated as 1 point each if present [CHF, HTN, DM, Vascular=MI/PAD/Aortic Plaque, Age if 65-74, or Female] Above score calculated as 2 points each if present [Age > 75, or Stroke/TIA/TE]    Orthostatic hypotension Patient is orthostatic in the office today. She is drinking a lot of fluids and staying hydrated. She is only on low-dose diltiazem and metoprolol. She needs both these for her  tachyarrhythmias. We'll hold off on decreasing these until we get the monitor and have her see Dr. Caryl Comes back in the next 1-2 days.    Sumner Boast, PA-C  07/28/2015 12:28 PM    [facility] [name], [place], [place]  [address] Phone: [phone]; Fax: [phone]

## 2015-07-28 NOTE — Patient Instructions (Addendum)
Medication Instructions:   Your physician recommends that you continue on your current medications as directed. Please refer to the Current Medication list given to you today.   If you need a refill on your cardiac medications before your next appointment, please call your pharmacy.  Labwork:  [place] CMET CBC IRON    Testing/Procedures: 61 HOUR Your physician has recommended that you wear a holter monitor. Holter monitors are medical devices that record the heart's electrical activity. Doctors most often use these monitors to diagnose arrhythmias. Arrhythmias are problems with the speed or rhythm of the heartbeat. The monitor is a small, portable device. You can wear one while you do your normal daily activities. This is usually used to diagnose what is causing palpitations/syncope (passing out).      Follow-Up:  AS ADD ON TO DR Caryl Comes SCHEDULED FOR TACHYCARDIA PER LENZE    Any Other Special Instructions Will Be Listed Below (If Applicable).

## 2015-07-31 ENCOUNTER — Encounter: Payer: Self-pay | Admitting: Internal Medicine

## 2015-08-01 ENCOUNTER — Telehealth: Payer: Self-pay | Admitting: Physician Assistant

## 2015-08-01 NOTE — Telephone Encounter (Signed)
New message      Patient recently saw the PA.  She is waiting for an appt to see Dr Caryl Comes.  Until then her HR is between 30-40.  Daughter want to know if there is anything else that can be done until she gets an appt or should she see someone else.  Please call

## 2015-08-01 NOTE — Telephone Encounter (Signed)
Scheduled patient for Tuesday, March 14 with Dr. Caryl Comes. Instructed EC to have patient check BP and HR prior to taking Metoprolol and to HOLD METOPROLOL if HR is under 60. EC agrees with treatment plan and understands to take patient to ED if symptoms worsen prior to OV.

## 2015-08-05 ENCOUNTER — Ambulatory Visit (INDEPENDENT_AMBULATORY_CARE_PROVIDER_SITE_OTHER): Payer: Medicare Other | Admitting: Internal Medicine

## 2015-08-05 ENCOUNTER — Encounter: Payer: Self-pay | Admitting: Internal Medicine

## 2015-08-05 VITALS — BP 140/62 | HR 59 | Ht 64.0 in | Wt 154.2 lb

## 2015-08-05 DIAGNOSIS — I480 Paroxysmal atrial fibrillation: Secondary | ICD-10-CM | POA: Diagnosis not present

## 2015-08-05 MED ORDER — FLECAINIDE ACETATE 100 MG PO TABS: 100.0000 mg | ORAL_TABLET | Freq: Two times a day (BID) | ORAL | Status: DC

## 2015-08-05 NOTE — Progress Notes (Signed)
Patient Care Team: Asencion Noble, MD as PCP - General (Internal Medicine) Arnoldo Lenis, MD as Consulting Physician (Cardiology)   HPI  Barbara [name] is a 75 y.o. female Seen in follow-up for atrial fibrillation.  She had documented nonsustained atrial tachycardia but no atrial fibrillation. We elected to undertake a 30 day event recorder.  This was done in [place]. We do not have it available.  She has a history of prior strokes.  We undertook a Myoview scan to help inform antiarrhythmic drug decisions; 10/15 this was normal. She continues to complain of atrial arrhythmia high heart rates and  low heart rates.    there've been times of confusion which she associates with slow heart rates.     Past Medical History  Diagnosis Date  . Hypertension   . Hyperlipidemia     total cholesterol of 271 and LDL of 187 in 07/2010  . 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 Macular degeneration   . Diabetes mellitus     A1c of 7.6 in 04/2010; moderate to high dose insulin  . Tobacco abuse   . Asthma     Mild  . Palpitations   . Chest pain   . GERD (gastroesophageal reflux disease)   . Stroke [facility]) 1993 2008    short term memory loss  . Chronic back pain     Past Surgical History  Procedure Laterality Date  . Cataract extraction      left  . Appendectomy    . Tubal ligation    . Colonoscopy  2009  . Esophagogastroduodenoscopy  10/21/2011    Procedure: ESOPHAGOGASTRODUODENOSCOPY (EGD);  Surgeon: Rogene Houston, MD;  [facility];  Service: Endoscopy;  Laterality: N/A;  1200  . Colonoscopy  03/30/2012    Procedure: COLONOSCOPY;  Surgeon: Rogene Houston, MD;  [facility];  Service: Endoscopy;  Laterality: N/A;  730  . Colonoscopy N/A 12/13/2013    Procedure:  COLONOSCOPY;  Surgeon: Rogene Houston, MD;  [facility];  Service: Endoscopy;  Laterality: N/A;  1200  . Esophagogastroduodenoscopy N/A 12/13/2013    Procedure: ESOPHAGOGASTRODUODENOSCOPY (EGD);  Surgeon: Rogene Houston, MD;  [facility];  Service: Endoscopy;  Laterality: N/A;    Current Outpatient Prescriptions  Medication Sig Dispense Refill  . Bevacizumab (AVASTIN) 100 MG/4ML SOLN Inject 1.25 mg into the vein every 8 (eight) weeks.    . brimonidine (ALPHAGAN) 0.2 % ophthalmic solution Place 1 drop into the right eye 3 (three) times daily.   1  . Calcium Carbonate Antacid (TUMS E-X SUGAR FREE PO) Take 2 tablets by mouth every morning.    . Cholecalciferol (VITAMIN D) 1000 UNITS capsule Take 1,000 Units by mouth at bedtime.     Marland Kitchen diltiazem (CARDIZEM) 90 MG tablet Take 0.5 tablets (45 mg total) by mouth 2 (two) times daily. 90 tablet 2  . famotidine (PEPCID) 20 MG tablet take 1 tablet by mouth twice a day 60 tablet 5  . HYDROcodone-acetaminophen (VICODIN) 5-500 MG per tablet Take 1 tablet by mouth every 4 (four) hours as needed. FOR PAIN    . insulin glargine (LANTUS) 100 UNIT/ML injection Inject 26 Units into the skin daily.     . insulin regular (NOVOLIN R,HUMULIN R) 100 units/mL injection Inject 2-10  Units into the skin 3 (three) times daily before meals. SLIDING SCALE    . metoprolol tartrate (LOPRESSOR) 25 MG tablet Take 2 tablets (50 mg total) by mouth 2 (two) times daily. 360 tablet 3  . Multiple Vitamins-Minerals (PRESERVISION AREDS 2) CAPS Take 1 capsule by mouth 2 (two) times daily.    . nitroGLYCERIN (NITROSTAT) 0.4 MG SL tablet Place 0.4 mg under the tongue every 5 (five) minutes x 3 doses as needed for chest pain.     Vladimir Faster Glycol-Propyl Glycol (SYSTANE OP) Apply 1 drop to eye 2 (two) times daily.    . vitamin C (ASCORBIC ACID) 500 MG tablet Take 1,000 mg by mouth at bedtime.     . vitamin E 1000 UNIT capsule Take 1,000 Units by mouth at bedtime.       No current facility-administered medications for this visit.    Allergies  Allergen Reactions  . Bee Venom Anaphylaxis  . Sulfamethoxazole Nausea And Vomiting  . Penicillins Itching and Rash  . Sulfonamide Derivatives Nausea And Vomiting    Review of Systems negative except from HPI and PMH  Physical Exam BP 140/62 mmHg  Pulse 59  Ht 5\' 4"  (1.626 m)  Wt 154 lb 3.2 oz (69.945 kg)  BMI 26.46 kg/m2 Well developed and well nourished in no acute distress HENT normal E scleral and icterus clear Neck Supple JVP flat; carotids brisk and full Clear to ausculation Irr Regular rate and rhythm, no murmurs gallops or rub Soft with active bowel sounds No clubbing cyanosis  Edema Alert and oriented, grossly normal motor and sensory function Skin Warm and Dry  ECG  07/28/15 demonstrates sinus rhythm at 66 with frequent PACs and nonsustained atrial tachycardia  Intervals 14/07/36 Axis 54    Assessment and  Plan  Atritachycardia-nonsustainedomplex atrial ectopy  Hypertension  Sinus brdycardia  Orthostatic lightheadedness  History of GI bleeding recurrent on apixoban  eval neg   atrial tachycardia is much more prevalent now that has been previously. I wonder whether it as explanation for her fatigue. She wonders whether his explanation for some of her confusion. I think antiarrhythmic adjunctive therapy is appropriate; we will initiate flecainide 100 mg twice daily physician at the least effects on sinus rates  We will obtain a 48-hour Holter monitor about 10-14 days thereafter  And then review these data.    We spent more than 50% of our >25 min visit in face to face counseling regarding the above

## 2015-08-05 NOTE — Patient Instructions (Addendum)
Medication Instructions:  Your physician has recommended you make the following change in your medication: 1) START Flecainide 100 twice a day  Labwork: None ordered  Testing/Procedures: Your physician has recommended that you wear a 48 hour holter monitor in 2 weeks. Holter monitors are medical devices that record the heart's electrical activity. Doctors most often use these monitors to diagnose arrhythmias. Arrhythmias are problems with the speed or rhythm of the heartbeat. The monitor is a small, portable device. You can wear one while you do your normal daily activities. This is usually used to diagnose what is causing palpitations/syncope (passing out).  Follow-Up: Your physician recommends that you schedule a follow-up appointment in: 4 weeks with Dr. Caryl Comes.   If you need a refill on your cardiac medications before your next appointment, please call your pharmacy.  Thank you for choosing [facility]!!

## 2015-08-11 ENCOUNTER — Telehealth: Payer: Self-pay | Admitting: Internal Medicine

## 2015-08-11 NOTE — Telephone Encounter (Signed)
New message  Pt daughter called. Taking flecainide for two weeks heart rate is 30's 40's 50's and also the bottom number is around 30 and 40. Its always over 30. Should the pt continue take the medication or go on the monitor. If so can she come in and get the monitor while she is taking the flecainide or just discontinue taking it and just get the monitor. Please call to discuss

## 2015-08-11 NOTE — Telephone Encounter (Signed)
Let's decrease the flecainide to 50 twice daily and then at the end of the week get a 48-hour Holter monitor

## 2015-08-11 NOTE — Telephone Encounter (Signed)
I called and spoke with the patient's daughter, Manuela Schwartz, and advised her of Dr. Olin Pia recommendations to decrease flecainide to 50 mg BID. I also advised her Dr. Caryl Comes wanted to have her wear the holter monitor at the end of the week. Per Manuela Schwartz, she is agreeable with recommendations and verbalizes understanding- she is transporting the patient and has appointments for herself on Thursday and Friday. She would like to bring the patient on Wednesday or Friday morning if possible. I advised her I would forward to scheduling and they should be in touch with her tomorrow.  She is agreeable.

## 2015-08-11 NOTE — Addendum Note (Signed)
Addended by: Alvis Lemmings C on: 08/11/2015 06:24 PM   Modules accepted: Orders

## 2015-08-11 NOTE — Telephone Encounter (Signed)
Follow up      Please call before you leave for the day.  Daughter is concerned over patient's heart rate being very low

## 2015-08-13 ENCOUNTER — Ambulatory Visit (INDEPENDENT_AMBULATORY_CARE_PROVIDER_SITE_OTHER): Payer: Medicare Other

## 2015-08-13 DIAGNOSIS — I480 Paroxysmal atrial fibrillation: Secondary | ICD-10-CM

## 2015-08-21 ENCOUNTER — Telehealth: Payer: Self-pay | Admitting: Internal Medicine

## 2015-08-21 NOTE — Telephone Encounter (Signed)
Per daughter - HR has been in the 50's but today has been dropping back into the low 40's.  She reports pt feels "horrible."  She is asking about the results from her repeat 48 hr monitor that she turned back in on Monday.  Advised I have checked with the monitor room and results are pending.  Pt is taking Flecainide 50 mg BID, Diltiazem 90 mg 1/2 tablet BID and Metoprolol 25 mg (2) tablets twice a day however if her HR is less than 60 bpm she only takes 25 mg BID.  Advised I will continue to check for the results of her most recent monitor and call back to advise of results when they are received.  Daughter states understanding.

## 2015-08-21 NOTE — Telephone Encounter (Signed)
Pt getting worse and dtr calling to see if we have results from monitor-and wants to know plan of treatment --pls call [phone]

## 2015-08-22 NOTE — Telephone Encounter (Signed)
Holter shows HR excursion of 50-70's.   Dizziness is sometimes correlated with SR in the 60's which makes bradycardia harder to blame.  For now, would decrease Metoprolol to 25mg  bid and we may need to discontinue.  Ultimately, PPM may be best option to treat atrial arrhythmias with underlying bradycardia.   She should probably be seen next week.  [name], NP 08/22/2015 4:55 PM

## 2015-08-22 NOTE — Telephone Encounter (Signed)
I called and spoke with the patient's daughter, Manuela Schwartz.  She is aware of Amber's findings on the holter. She is also aware to decrease metoprolol to 25 mg BID- she is aware the patient may take an extra metoprolol 25 mg if she feels her HR's are going fast.  I have scheduled a follow up appointment with [name], PA on 08/28/15. Manuela Schwartz is agreeable with this.

## 2015-08-22 NOTE — Telephone Encounter (Signed)
Amber- can you review the message below and just look at the patient's holter? It was just posted to Dr. Olin Pia basket today and I was trying to get an idea of what to tell the patient/ her daughter before Dr. Caryl Comes gets back.  Thanks!

## 2015-08-25 ENCOUNTER — Telehealth: Payer: Self-pay | Admitting: Cardiovascular Disease

## 2015-08-25 ENCOUNTER — Telehealth: Payer: Self-pay | Admitting: Internal Medicine

## 2015-08-25 NOTE — Telephone Encounter (Signed)
New Message  Pt dtr cancelled appt w./ Renee for 4/6 and requested to speak w/ rN cocnerning lab work. Please call back and discuss.

## 2015-08-25 NOTE — Telephone Encounter (Signed)
I called and spoke with Barbara [name]- she states the patient wants to wait until she sees Dr. Caryl Comes on 4/14. She is not wanting to come in and see the PA. The patient's daughter was wanting to know if she could have her lab work done at [facility] and have labs faxed here. I advised there are no labs I needed to have drawn on her until it is decided she needs a PPM. I advised the patient's daughter that the appointment with Joseph Art is still in for 4/6, and I will cancel this tomorrow if she does not call back. The patient's daughter was very concerned about her symptoms last week. She will discuss this with the patient again to see if she can get the patient to come see Renee on 4/6.

## 2015-08-27 NOTE — Progress Notes (Signed)
Cardiology Office Note Date:  08/27/2015  Patient ID:  Barbara, [name] 1940/09/22, MRN [id number] PCP:  Barbara Noble, MD  Cardiologist:  Dr. Harl [name] Electrophysiologist: Dr. Caryl [name]  refresh   Chief Complaint:  ?dizziness, ?bradycardia, recent adjustments in meds down-titration of flecainide and metoprolol  History of Present Illness: Barbara [name] is a 75 y.o. female with history of PAFib, CVA, HTN, HLD, DM, asthma, CBP, [name] to the office today to be seen for Dr. Caryl [name].  She last saw him last month at that time with have increased atrial tachycardia episodes and started on Flecainide.  Subsequently 08/11/15 was decreased with c/o HR being as low as the 30's , she wore a holter monitor afterwards with HR 50-70's noting c/o dizzy spells correlated with HR 60's, her metoprolol decreased 08/22/15.  She [name] today with c/o    brady symptoms  pattern?  carotids? Neg [place] 2004  Arrhythmia Hx Records indicate she was said to have AFib March 2015, started on a/c but stopped with ? GIB Sept 2015 consulted with Dr. Caryl [name], noted atrial tachycardia 04/15/14 note mentions atrial fibrillation/complex atrial ectopy and started on Eliquis given high CHA2DS2Vasc score  Sep 2016 off a/c with recurrent bleeding  Past Medical History  Diagnosis Date  . Hypertension   . Hyperlipidemia     total cholesterol of 271 and LDL of 187 in 07/2010  . 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 Macular degeneration   . Diabetes mellitus     A1c of 7.6 in 04/2010; moderate to high dose insulin  . Tobacco abuse   . Asthma     Mild  . Palpitations   . Chest pain   . GERD (gastroesophageal reflux disease)   . Stroke Barbara [name]) 1993 2008    short term memory loss  . Chronic back pain     Past Surgical History  Procedure  Laterality Date  . Cataract extraction      left  . Appendectomy    . Tubal ligation    . Colonoscopy  2009  . Esophagogastroduodenoscopy  10/21/2011    Procedure: ESOPHAGOGASTRODUODENOSCOPY (EGD);  Surgeon: Barbara Houston, MD;  [facility];  Service: Endoscopy;  Laterality: N/A;  1200  . Colonoscopy  03/30/2012    Procedure: COLONOSCOPY;  Surgeon: Barbara Houston, MD;  [facility];  Service: Endoscopy;  Laterality: N/A;  730  . Colonoscopy N/A 12/13/2013    Procedure: COLONOSCOPY;  Surgeon: Barbara Houston, MD;  [facility];  Service: Endoscopy;  Laterality: N/A;  1200  . Esophagogastroduodenoscopy N/A 12/13/2013    Procedure: ESOPHAGOGASTRODUODENOSCOPY (EGD);  Surgeon: Barbara Houston, MD;  [facility];  Service: Endoscopy;  Laterality: N/A;    Current Outpatient Prescriptions  Medication Sig Dispense Refill  . Bevacizumab (AVASTIN) 100 MG/4ML SOLN Inject 1.25 mg into the vein every 8 (eight) weeks.    . brimonidine (ALPHAGAN) 0.2 % ophthalmic solution Place 1 drop into the right eye 3 (three) times daily.   1  . Calcium Carbonate Antacid (TUMS E-X SUGAR FREE PO) Take 2 tablets by mouth every morning.    . Cholecalciferol (VITAMIN D) 1000 UNITS capsule Take 1,000 Units by mouth at bedtime.     Marland Kitchen diltiazem (CARDIZEM) 90 MG tablet Take 0.5 tablets (45 mg total) by mouth  2 (two) times daily. 90 tablet 2  . famotidine (PEPCID) 20 MG tablet take 1 tablet by mouth twice a day 60 tablet 5  . flecainide (TAMBOCOR) 100 MG tablet Take 1/2 tablet (50 mg) by mouth twice daily    . HYDROcodone-acetaminophen (VICODIN) 5-500 MG per tablet Take 1 tablet by mouth every 4 (four) hours as needed. FOR PAIN    . insulin glargine (LANTUS) 100 UNIT/ML injection Inject 26 Units into the skin daily.     . insulin regular (NOVOLIN R,HUMULIN R) 100 units/mL injection Inject 2-10 Units into the skin 3 (three) times daily before meals. SLIDING SCALE    . metoprolol  tartrate (LOPRESSOR) 25 MG tablet Take 1 tablet (25 mg total) by mouth 2 (two) times daily.    . Multiple Vitamins-Minerals (PRESERVISION AREDS 2) CAPS Take 1 capsule by mouth 2 (two) times daily.    . nitroGLYCERIN (NITROSTAT) 0.4 MG SL tablet Place 0.4 mg under the tongue every 5 (five) minutes x 3 doses as needed for chest pain.     Barbara [name] Glycol-Propyl Glycol (SYSTANE OP) Apply 1 drop to eye 2 (two) times daily.    . vitamin C (ASCORBIC ACID) 500 MG tablet Take 1,000 mg by mouth at bedtime.     . vitamin E 1000 UNIT capsule Take 1,000 Units by mouth at bedtime.      No current facility-administered medications for this visit.    Allergies:   Bee venom; Sulfamethoxazole; Penicillins; and Sulfonamide derivatives   Social History:  The patient  reports that she has been smoking Cigarettes.  She has a 3.75 pack-year smoking history. She has never used smokeless tobacco. She reports that she does not drink alcohol or use illicit drugs.   Family History:  The patient's family history includes Cancer in her brother, father, sister, sister, and sister; Heart attack in her brother and mother.  ROS:  Please see the history of present illness. Otherwise, review of systems is positive for .   All other systems are reviewed and otherwise negative.   PHYSICAL EXAM:  VS:  There were no vitals taken for this visit. BMI: There is no weight on file to calculate BMI. Well nourished, well developed, in no acute distress HEENT: normocephalic, atraumatic Neck: no JVD, carotid bruits or masses Cardiac:  normal S1, S2; RRR; no significant murmurs, no rubs, or gallops Lungs:  clear to auscultation bilaterally, no wheezing, rhonchi or rales Abd: soft, nontender,  + BS MS: no deformity or atrophy Ext: no edema Skin: warm and dry, no rash Neuro:  No gross deficits appreciated Psych: euthymic mood, full affect   EKG:  Done today shows   08/15/13: Echocardiogram Study Conclusions - Procedure  narrative: Transthoracic echocardiography. Image quality was suboptimal. The study was technically difficult, as a result of poor sound wave transmission. - Left ventricle: The cavity size was normal. Wall thickness was increased in a pattern of mild LVH. Systolic function was normal. The estimated ejection fraction was in the range of 60% to 65%. Wall motion was normal; there were no regional wall motion abnormalities. The study was not technically sufficient to allow evaluation of LV diastolic dysfunction due to atrial fibrillation. - Mitral valve: Mildly thickened leaflets . Mild regurgitation.  02/27/14: stress myoview Impression Exercise Capacity: Lexiscan with low level exercise. BP Response: Normal blood pressure response. Clinical Symptoms: Chest heaviness ECG Impression: No significant ST segment change suggestive of ischemia. Comparison with Prior Nuclear Study: No images to compare Overall Impression:  Normal stress nuclear study. There is no scar or ischemia. This is a low risk scan. LV Ejection Fraction: 70%. LV Wall Motion: Normal Wall Motion.   Recent Labs: 07/28/2015: ALT 12; BUN 16; Creat 0.97*; Hemoglobin 13.5; Platelets 280; Potassium 4.3; Sodium 141; TSH 6.81*  No results found for requested labs within last 365 days.   CrCl cannot be calculated (Unknown ideal weight.).   Wt Readings from Last 3 Encounters:  08/05/15 154 lb 3.2 oz (69.945 kg)  07/28/15 154 lb (69.854 kg)  02/04/15 153 lb 3.2 oz (69.491 kg)     Other studies reviewed: Additional studies/records reviewed today include: summarized above  ASSESSMENT AND PLAN:  1. Arial tachycardia    Reports of bradycardic rates observed at home  Disposition: F/u with   Current medicines are reviewed at length with the patient today.  The patient did not have any concerns regarding medicines.  Haywood Lasso, PA-C 08/27/2015 8:28 PM     [place] [id number] [phone] (office)  [phone] (fax)    This encounter was created in error - please disregard.

## 2015-08-28 ENCOUNTER — Encounter: Payer: Medicare Other | Admitting: Physician Assistant

## 2015-09-01 DIAGNOSIS — E119 Type 2 diabetes mellitus without complications: Secondary | ICD-10-CM | POA: Diagnosis not present

## 2015-09-01 DIAGNOSIS — E039 Hypothyroidism, unspecified: Secondary | ICD-10-CM | POA: Diagnosis not present

## 2015-09-01 DIAGNOSIS — Z79899 Other long term (current) drug therapy: Secondary | ICD-10-CM | POA: Diagnosis not present

## 2015-09-04 DIAGNOSIS — H353211 Exudative age-related macular degeneration, right eye, with active choroidal neovascularization: Secondary | ICD-10-CM | POA: Diagnosis not present

## 2015-09-05 ENCOUNTER — Encounter: Payer: Self-pay | Admitting: Internal Medicine

## 2015-09-05 ENCOUNTER — Ambulatory Visit (INDEPENDENT_AMBULATORY_CARE_PROVIDER_SITE_OTHER): Payer: Medicare Other | Admitting: Internal Medicine

## 2015-09-05 VITALS — BP 136/82 | HR 63 | Ht 64.0 in | Wt 153.6 lb

## 2015-09-05 DIAGNOSIS — I471 Supraventricular tachycardia: Secondary | ICD-10-CM

## 2015-09-05 DIAGNOSIS — R42 Dizziness and giddiness: Secondary | ICD-10-CM

## 2015-09-05 DIAGNOSIS — R001 Bradycardia, unspecified: Secondary | ICD-10-CM | POA: Diagnosis not present

## 2015-09-05 LAB — T4, FREE: Free T4: 1 ng/dL (ref 0.8–1.8)

## 2015-09-05 LAB — T3, FREE: T3, Free: 2.5 pg/mL (ref 2.3–4.2)

## 2015-09-05 MED ORDER — FLECAINIDE ACETATE 100 MG PO TABS: 100.0000 mg | ORAL_TABLET | Freq: Two times a day (BID) | ORAL | Status: DC

## 2015-09-05 NOTE — Progress Notes (Signed)
Patient Care Team: Asencion Noble, MD as PCP - General (Internal Medicine) Arnoldo Lenis, MD as Consulting Physician (Cardiology)   HPI  Barbara [name] is a 75 y.o. female Seen in follow-up for atrial fibrillation.  She had documented nonsustained atrial tachycardia but no atrial fibrillation. We elected to undertake a 30 day event recorder.   We started flecainide at last visit--  she continues to have episodes of fast and slow heart rates.    She has a history of prior strokes.  We undertook a Myoview scan to help inform antiarrhythmic drug decisions; 10/15 this was normal. It is the family's impression that slow heart rates contribute to fatigue. They also note that her ferritin level is normal. 31 and a both received iron infusions. She has daytime somnolence and sleep disordered breathing.      Past Medical History  Diagnosis Date  . Hypertension   . Hyperlipidemia     total cholesterol of 271 and LDL of 187 in 07/2010  . 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 Macular degeneration   . Diabetes mellitus     A1c of 7.6 in 04/2010; moderate to high dose insulin  . Tobacco abuse   . Asthma     Mild  . Palpitations   . Chest pain   . GERD (gastroesophageal reflux disease)   . Stroke [facility]) 1993 2008    short term memory loss  . Chronic back pain     Past Surgical History  Procedure Laterality Date  . Cataract extraction      left  . Appendectomy    . Tubal ligation    . Colonoscopy  2009  . Esophagogastroduodenoscopy  10/21/2011    Procedure: ESOPHAGOGASTRODUODENOSCOPY (EGD);  Surgeon: Rogene Houston, MD;  [facility];  Service: Endoscopy;  Laterality: N/A;  1200  . Colonoscopy  03/30/2012    Procedure: COLONOSCOPY;  Surgeon: Rogene Houston, MD;  [facility]: [facility];  Service: Endoscopy;  Laterality: N/A;  730  . Colonoscopy N/A 12/13/2013    Procedure: COLONOSCOPY;  Surgeon: Rogene Houston, MD;  [facility];  Service: Endoscopy;  Laterality: N/A;  1200  . Esophagogastroduodenoscopy N/A 12/13/2013    Procedure: ESOPHAGOGASTRODUODENOSCOPY (EGD);  Surgeon: Rogene Houston, MD;  [facility];  Service: Endoscopy;  Laterality: N/A;    Current Outpatient Prescriptions  Medication Sig Dispense Refill  . Bevacizumab (AVASTIN) 100 MG/4ML SOLN Inject 1.25 mg into the vein every 8 (eight) weeks.    . brimonidine (ALPHAGAN) 0.2 % ophthalmic solution Place 1 drop into the right eye 3 (three) times daily.   1  . Calcium Carbonate Antacid (TUMS E-X SUGAR FREE PO) Take 2 tablets by mouth every morning.    . Cholecalciferol (VITAMIN D) 1000 UNITS capsule Take 1,000 Units by mouth at bedtime.     Marland Kitchen diltiazem (CARDIZEM) 90 MG tablet Take 0.5 tablets (45 mg total) by mouth 2 (two) times daily. 90 tablet 2  . famotidine (PEPCID) 20 MG tablet take 1 tablet by mouth twice a day 60 tablet 5  . flecainide (TAMBOCOR) 100 MG tablet Take 1/2 tablet (50 mg) by mouth twice daily    . insulin glargine (LANTUS) 100 UNIT/ML injection Inject 26 Units into the skin daily.     Marland Kitchen  insulin regular (NOVOLIN R,HUMULIN R) 100 units/mL injection Inject 2-10 Units into the skin 3 (three) times daily before meals. SLIDING SCALE    . metoprolol tartrate (LOPRESSOR) 25 MG tablet Take 1 tablet (25 mg total) by mouth 2 (two) times daily.    . Multiple Vitamins-Minerals (PRESERVISION AREDS 2) CAPS Take 1 capsule by mouth 2 (two) times daily.    . nitroGLYCERIN (NITROSTAT) 0.4 MG SL tablet Place 0.4 mg under the tongue every 5 (five) minutes x 3 doses as needed for chest pain.     Vladimir Faster Glycol-Propyl Glycol (SYSTANE OP) Apply 1 drop to eye 2 (two) times daily.    Marland Kitchen PROAIR HFA 108 (90 Base) MCG/ACT inhaler Inhale 1 puff into the lungs as directed.  1  . vitamin C  (ASCORBIC ACID) 500 MG tablet Take 1,000 mg by mouth at bedtime.     . vitamin E 1000 UNIT capsule Take 1,000 Units by mouth at bedtime.      No current facility-administered medications for this visit.    Allergies  Allergen Reactions  . Bee Venom Anaphylaxis  . Sulfamethoxazole Nausea And Vomiting  . Penicillins Itching and Rash  . Sulfonamide Derivatives Nausea And Vomiting    Review of Systems negative except from HPI and PMH  Physical Exam BP 136/82 mmHg  Pulse 63  Ht 5\' 4"  (1.626 m)  Wt 153 lb 9.6 oz (69.673 kg)  BMI 26.35 kg/m2  SpO2 97% Well developed and well nourished in no acute distress HENT normal E scleral and icterus clear Neck Supple JVP flat; carotids brisk and full Clear to ausculation regular rate and rhythm, no murmurs gallops or rub Soft with active bowel sounds No clubbing cyanosis  Edema Alert and oriented, grossly normal motor and sensory function Skin Warm and Dry  ECG  07/28/15 demonstrates sinus rhythm at 66 with frequent PACs and nonsustained atrial tachycardia  Intervals 14/07/36 Axis 54    Assessment and  Plan  Atritachycardia-nonsustained omplex atrial ectopy  Hypertension  Fatigue  Daytime somnlonece and sleep disordered breathing  Sinus brdycardia  Orthostatic lightheadedness  History of GI bleeding recurrent on apixoban  eval neg   Atrial tachycardia seems much less prevalent by auscultation; she still continues to have symptoms at home with fast rates and slow rates. We'll discontinue the metoprolol and the calcium blocker; it's adjunct use with flecainide Is   not necessary and she does not have a fibrillation post conversion atrial flutter is not a concern   Given her bradycardia we will stop her calcium blocker and her beta blocker.  She has significant fatigue and daytime somnolence; I will ask her PCP to undertake a sleep study  The family is also concerned about hypoferritinemia; they will follow-up with the family  hematologist  We will plan to undertake a treadmill in about 4 weeks to see if we can identify the culprit in her profound change in exercise tolerance noted over the last year

## 2015-09-05 NOTE — Patient Instructions (Addendum)
Medication Instructions: 1) Stop metoprolol  2) Stop diltiazem 3) Increase flecainide to 100 mg one tablet by mouth twice daily  Labwork: - Your physician recommends that you have lab work today: free T3/ free T4  Procedures/Testing: - Your physician has requested that you have an exercise tolerance test- in 4 weeks with Dr. Caryl Comes (ok to double book on his schedule). For further information please visit [email or web address]. Please also follow instruction sheet, as given.  Follow-Up: - to be determined base on treadmill results.  Any Additional Special Instructions Will Be Listed Below (If Applicable).     If you need a refill on your cardiac medications before your next appointment, please call your pharmacy.

## 2015-09-09 DIAGNOSIS — E119 Type 2 diabetes mellitus without complications: Secondary | ICD-10-CM | POA: Diagnosis not present

## 2015-09-09 DIAGNOSIS — E039 Hypothyroidism, unspecified: Secondary | ICD-10-CM | POA: Diagnosis not present

## 2015-09-09 DIAGNOSIS — I480 Paroxysmal atrial fibrillation: Secondary | ICD-10-CM | POA: Diagnosis not present

## 2015-09-09 DIAGNOSIS — Z6827 Body mass index (BMI) 27.0-27.9, adult: Secondary | ICD-10-CM | POA: Diagnosis not present

## 2015-09-12 DIAGNOSIS — E119 Type 2 diabetes mellitus without complications: Secondary | ICD-10-CM | POA: Diagnosis not present

## 2015-10-01 ENCOUNTER — Encounter: Payer: Medicare Other | Admitting: Internal Medicine

## 2015-10-01 ENCOUNTER — Ambulatory Visit (INDEPENDENT_AMBULATORY_CARE_PROVIDER_SITE_OTHER): Payer: Medicare Other

## 2015-10-01 DIAGNOSIS — I471 Supraventricular tachycardia: Secondary | ICD-10-CM

## 2015-10-01 LAB — EXERCISE TOLERANCE TEST
CHL CUP RESTING HR STRESS: 73 {beats}/min
CHL CUP STRESS STAGE 2 GRADE: 0 %
CHL CUP STRESS STAGE 2 SPEED: 1 mph
CHL CUP STRESS STAGE 3 GRADE: 0 %
CHL CUP STRESS STAGE 3 HR: 78 {beats}/min
CHL CUP STRESS STAGE 3 SPEED: 1 mph
CHL CUP STRESS STAGE 4 DBP: 84 mmHg
CHL CUP STRESS STAGE 4 GRADE: 10 %
CHL CUP STRESS STAGE 5 HR: 115 {beats}/min
CHL CUP STRESS STAGE 6 HR: 102 {beats}/min
CHL CUP STRESS STAGE 6 SPEED: 0 mph
CHL CUP STRESS STAGE 7 HR: 79 {beats}/min
CHL CUP STRESS STAGE 7 SPEED: 0 mph
CSEPED: 3 min
CSEPEDS: 31 s
CSEPEW: 5.2 METS
CSEPPHR: 115 {beats}/min
CSEPPMHR: 77 %
MPHR: 148 {beats}/min
Percent HR: 81 %
RPE: 14
Stage 1 DBP: 75 mmHg
Stage 1 Grade: 0 %
Stage 1 HR: 80 {beats}/min
Stage 1 SBP: 148 mmHg
Stage 1 Speed: 0 mph
Stage 2 HR: 78 {beats}/min
Stage 4 HR: 112 {beats}/min
Stage 4 SBP: 195 mmHg
Stage 4 Speed: 1.7 mph
Stage 5 Grade: 12 %
Stage 5 Speed: 2.5 mph
Stage 6 DBP: 73 mmHg
Stage 6 Grade: 0 %
Stage 6 SBP: 200 mmHg
Stage 7 DBP: 59 mmHg
Stage 7 Grade: 0 %
Stage 7 SBP: 150 mmHg

## 2015-10-14 ENCOUNTER — Encounter (HOSPITAL_COMMUNITY): Payer: Self-pay

## 2015-10-14 ENCOUNTER — Emergency Department (HOSPITAL_COMMUNITY)
Admission: EM | Admit: 2015-10-14 | Discharge: 2015-10-14 | Discharge status: Against Medical Advice | Payer: Medicare Other | Attending: Emergency Medicine | Admitting: Emergency Medicine

## 2015-10-14 DIAGNOSIS — E785 Hyperlipidemia, unspecified: Secondary | ICD-10-CM | POA: Diagnosis not present

## 2015-10-14 DIAGNOSIS — E11649 Type 2 diabetes mellitus with hypoglycemia without coma: Secondary | ICD-10-CM | POA: Insufficient documentation

## 2015-10-14 DIAGNOSIS — Z794 Long term (current) use of insulin: Secondary | ICD-10-CM | POA: Diagnosis not present

## 2015-10-14 DIAGNOSIS — Z79899 Other long term (current) drug therapy: Secondary | ICD-10-CM | POA: Diagnosis not present

## 2015-10-14 DIAGNOSIS — F17210 Nicotine dependence, cigarettes, uncomplicated: Secondary | ICD-10-CM | POA: Diagnosis not present

## 2015-10-14 DIAGNOSIS — I10 Essential (primary) hypertension: Secondary | ICD-10-CM | POA: Diagnosis not present

## 2015-10-14 DIAGNOSIS — J45909 Unspecified asthma, uncomplicated: Secondary | ICD-10-CM | POA: Diagnosis not present

## 2015-10-14 DIAGNOSIS — Z8673 Personal history of transient ischemic attack (TIA), and cerebral infarction without residual deficits: Secondary | ICD-10-CM | POA: Diagnosis not present

## 2015-10-14 DIAGNOSIS — E162 Hypoglycemia, unspecified: Secondary | ICD-10-CM | POA: Diagnosis present

## 2015-10-14 DIAGNOSIS — R42 Dizziness and giddiness: Secondary | ICD-10-CM | POA: Insufficient documentation

## 2015-10-14 LAB — CBG MONITORING, ED
GLUCOSE-CAPILLARY: 94 mg/dL (ref 65–99)
GLUCOSE-CAPILLARY: 172 mg/dL — AB (ref 65–99)
Glucose-Capillary: 126 mg/dL — ABNORMAL HIGH (ref 65–99)
Glucose-Capillary: 39 mg/dL — CL (ref 65–99)
Glucose-Capillary: 197 mg/dL — ABNORMAL HIGH (ref 65–99)

## 2015-10-14 LAB — I-STAT CHEM 8, ED
BUN: 14 mg/dL (ref 6–20)
CALCIUM ION: 1.16 mmol/L (ref 1.13–1.30)
CREATININE: 0.9 mg/dL (ref 0.44–1.00)
Chloride: 102 mmol/L (ref 101–111)
GLUCOSE: 89 mg/dL (ref 65–99)
HCT: 45 % (ref 36.0–46.0)
HEMOGLOBIN: 15.3 g/dL — AB (ref 12.0–15.0)
Potassium: 3.6 mmol/L (ref 3.5–5.1)
Sodium: 140 mmol/L (ref 135–145)
TCO2: 22 mmol/L (ref 0–100)

## 2015-10-14 MED ORDER — DEXTROSE 50 % IV SOLN
1.0000 | Freq: Once | INTRAVENOUS | Status: AC
  Administered 2015-10-14: 50 mL via INTRAVENOUS

## 2015-10-14 NOTE — ED Notes (Signed)
Pt given meal tray.

## 2015-10-14 NOTE — ED Notes (Signed)
MD at bedside. 

## 2015-10-14 NOTE — ED Provider Notes (Addendum)
CSN: [id number]     Arrival date & time 10/14/15  0847 History  By signing my name below, I, Emmanuella Mensah, attest that this documentation has been prepared under the direction and in the presence of Dorie Rank, MD. Electronically Signed: Judithann Sauger, ED Scribe. 10/14/2015. 9:08 AM.    Chief Complaint  Patient presents with  . Hypoglycemia   Patient is a 75 y.o. female presenting with hypoglycemia. The history is provided by the patient. No language interpreter was used.  Hypoglycemia Initial blood sugar:  51 Severity:  Moderate Onset quality:  Sudden Progression:  Partially resolved Chronicity:  New Diabetic status:  Controlled with insulin Relieved by:  Nothing Ineffective treatments:  Eating and oral glucose Associated symptoms: no vomiting    HPI Comments: Barbara [name] is a 75 y.o. female with a hx of HTN and DM who presents to the Emergency Department complaining of an episode of sudden onset of lightheadedness and a "woozy" sensation after low blood sugar that occurred this am. She explains that she believes that she took her 25 units Lantus thinking it was the 6 units. She states that her symptoms began after taking the Lantus and her CBG at home was lowest at 51. She reports that she had breakfast this am and took a sugar gel PO. No other alleviating factors noted. She reports that before she started the Lantus, her blood sugar would go down in the 30s but was unsure of the reasons why her sugar was so low. She denies any fever, chills, abdominal pain, n/v, or any other complaints at this time.   Past Medical History  Diagnosis Date  . Hypertension   . Hyperlipidemia     total cholesterol of 271 and LDL of 187 in 07/2010  . 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 Macular  degeneration   . Diabetes mellitus     A1c of 7.6 in 04/2010; moderate to high dose insulin  . Tobacco abuse   . Asthma     Mild  . Palpitations   . Chest pain   . GERD (gastroesophageal reflux disease)   . Stroke [facility]) 1993 2008    short term memory loss  . Chronic back pain    Past Surgical History  Procedure Laterality Date  . Cataract extraction      left  . Appendectomy    . Tubal ligation    . Colonoscopy  2009  . Esophagogastroduodenoscopy  10/21/2011    Procedure: ESOPHAGOGASTRODUODENOSCOPY (EGD);  Surgeon: Rogene Houston, MD;  [facility];  Service: Endoscopy;  Laterality: N/A;  1200  . Colonoscopy  03/30/2012    Procedure: COLONOSCOPY;  Surgeon: Rogene Houston, MD;  [facility];  Service: Endoscopy;  Laterality: N/A;  730  . Colonoscopy N/A 12/13/2013    Procedure: COLONOSCOPY;  Surgeon: Rogene Houston, MD;  [facility];  Service: Endoscopy;  Laterality: N/A;  1200  . Esophagogastroduodenoscopy N/A 12/13/2013    Procedure: ESOPHAGOGASTRODUODENOSCOPY (EGD);  Surgeon: Rogene Houston, MD;  [facility];  Service: Endoscopy;  Laterality: N/A;   Family History  Problem Relation Age of Onset  . Heart attack Mother   . Cancer Father   . Cancer Sister   . Heart attack Brother   . Cancer Brother   .  Cancer Sister   . Cancer Sister    Social History  Substance Use Topics  . Smoking status: Current Every Day Smoker -- 0.25 packs/day for 15 years    Types: Cigarettes  . Smokeless tobacco: Never Used     Comment: QUIT SMOKING 6 months ago after smoking20 yrs  . Alcohol Use: No   OB History    No data available     Review of Systems  Constitutional: Negative for fever and chills.  Gastrointestinal: Negative for vomiting, abdominal pain and diarrhea.  Neurological: Positive for light-headedness.  All other systems reviewed and are negative.     Allergies  Bee venom; Sulfamethoxazole; Penicillins; and Sulfonamide  derivatives  Home Medications   Prior to Admission medications   Medication Sig Start Date End Date Taking? Authorizing Provider  Bevacizumab (AVASTIN) 100 MG/4ML SOLN Inject 1.25 mg into the vein every 8 (eight) weeks.   Yes Historical Provider, MD  brimonidine (ALPHAGAN) 0.2 % ophthalmic solution Place 1 drop into the right eye 3 (three) times daily.  01/31/15  Yes Historical Provider, MD  Calcium Carbonate Antacid (TUMS E-X SUGAR FREE PO) Take 2 tablets by mouth every morning.   Yes Historical Provider, MD  Cholecalciferol (VITAMIN D) 1000 UNITS capsule Take 1,000 Units by mouth at bedtime.    Yes Historical Provider, MD  flecainide (TAMBOCOR) 100 MG tablet Take 1 tablet (100 mg total) by mouth 2 (two) times daily. 09/05/15  Yes Deboraha Sprang, MD  insulin glargine (LANTUS) 100 UNIT/ML injection Inject 25 Units into the skin daily.  08/16/13  Yes Asencion Noble, MD  insulin regular (NOVOLIN R,HUMULIN R) 100 units/mL injection Inject 2-10 Units into the skin 3 (three) times daily before meals. SLIDING SCALE   Yes Historical Provider, MD  metoprolol tartrate (LOPRESSOR) 25 MG tablet Take 25 mg by mouth daily as needed (high heart rate).  08/18/15  Yes Historical Provider, MD  Multiple Vitamins-Minerals (PRESERVISION AREDS 2) CAPS Take 1 capsule by mouth 2 (two) times daily.   Yes Historical Provider, MD  nitroGLYCERIN (NITROSTAT) 0.4 MG SL tablet Place 0.4 mg under the tongue every 5 (five) minutes x 3 doses as needed for chest pain.  06/02/11  Yes Yehuda Savannah, MD  Polyethyl Glycol-Propyl Glycol (SYSTANE OP) Apply 1 drop to eye 2 (two) times daily.   Yes Historical Provider, MD  PROAIR HFA 108 (90 Base) MCG/ACT inhaler Inhale 1 puff into the lungs as directed. 08/12/15  Yes Historical Provider, MD  vitamin C (ASCORBIC ACID) 500 MG tablet Take 1,000 mg by mouth at bedtime.    Yes Historical Provider, MD  vitamin E 1000 UNIT capsule Take 1,000 Units by mouth at bedtime.    Yes Historical Provider, MD   famotidine (PEPCID) 20 MG tablet take 1 tablet by mouth twice a day Patient not taking: Reported on 10/14/2015 04/03/15   Rogene Houston, MD   BP 123/52 mmHg  Pulse 72  Temp(Src) 98.4 F (36.9 C) (Oral)  Resp 18  Ht 5\' 3"  (1.6 m)  Wt 68.04 kg  BMI 26.58 kg/m2  SpO2 95% Physical Exam  Constitutional: She appears well-developed and well-nourished. No distress.  HENT:  Head: Normocephalic and atraumatic.  Right Ear: External ear normal.  Left Ear: External ear normal.  Eyes: Conjunctivae are normal. Right eye exhibits no discharge. Left eye exhibits no discharge. No scleral icterus.  Neck: Neck supple. No tracheal deviation present.  Cardiovascular: Normal rate, regular rhythm and intact distal pulses.  Pulmonary/Chest: Effort normal and breath sounds normal. No stridor. No respiratory distress. She has no wheezes. She has no rales.  Abdominal: Soft. Bowel sounds are normal. She exhibits no distension. There is no tenderness. There is no rebound and no guarding.  Musculoskeletal: She exhibits no edema or tenderness.  Neurological: She is alert. She has normal strength. No cranial nerve deficit (no facial droop, extraocular movements intact, no slurred speech) or sensory deficit. She exhibits normal muscle tone. She displays no seizure activity. Coordination normal.  Skin: Skin is warm and dry. No rash noted.  Psychiatric: She has a normal mood and affect.  Nursing note and vitals reviewed.   ED Course  Procedures (including critical care time) DIAGNOSTIC STUDIES: Oxygen Saturation is 92% on RA, low by my interpretation.    COORDINATION OF CARE: 9:05 AM- Pt advised of plan for treatment and pt agrees. Pt will be monitored here for several hours. Pt will receive lab work for further evaluation.   Medications  dextrose 50 % solution 50 mL (50 mLs Intravenous Given 10/14/15 1108)      Labs Review Labs Reviewed  CBG MONITORING, ED - Abnormal; Notable for the following:     Glucose-Capillary 172 (*)    All other components within normal limits  I-STAT CHEM 8, ED - Abnormal; Notable for the following:    Hemoglobin 15.3 (*)    All other components within normal limits  CBG MONITORING, ED - Abnormal; Notable for the following:    Glucose-Capillary 126 (*)    All other components within normal limits  CBG MONITORING, ED  CBG MONITORING, ED     Dorie Rank, MD has personally reviewed and evaluated these images and lab results as part of my medical decision-making.    MDM   Final diagnoses:  Hypoglycemia    1030  The patient's blood sugar was monitored in the emergency room. It has remained stable above the 100s. I discussed having the patient wait in the emergency room a little bit longer because her sugar did decrease from 172-126. The patient however would like to go home. She is comfortable monitoring her blood sugar at home. She states she can take a glucose gel at home if she needs to. She can also return to the emergency room easily.  I personally performed the services described in this documentation, which was scribed in my presence.  The recorded information has been reviewed and is accurate.    Dorie Rank, MD 10/14/15 1039  Pt had her blood sugar checked before she was getting ready to leave.  Blood sugar decreased to 39.  Pt was given d50 IV.   Pt still wanted to leave but she now agrees to stay longer.  Her husband has an appointment that she needed to drive him to.  Pt is not safe to do that and I explained that to her.  i asked them to try and find alternative transportation for home while we monitor her longer.  1200  Pt ended up leaving AMA after getting D50.  She told the nurse she will monitor her blood sugar at home.  I had tried to express to her earlier that her blood sugar could drop rapidly.  I did not get a chance to speak to her again.  Dorie Rank, MD 10/14/15 [phone]

## 2015-10-14 NOTE — Discharge Instructions (Signed)

## 2015-10-14 NOTE — ED Notes (Addendum)
Pt verbalizes that she wants to leave and she doesn't want to stay. Pt agreed to stay to receive D50 and be re-evaluated within 15-20 minutes.   Pt is alert and oriented with no problems noted. Pt can answer all questions appropriately.

## 2015-10-14 NOTE — ED Notes (Signed)
Pt reports she took 6 units of fast acting insulin and then she thinks instead of drawing up 25 units of lantus, she may have drawn up 25units of the fast acting and took it.  Pt says feels "woozy"   Reports cbg was 51 a few min ago at home.  Pt did eat breakfast.

## 2015-10-14 NOTE — ED Notes (Signed)
Pt made aware of consequence of leaving AMA. Pt alert and ambulatory upon discharge. Pt can answer all questions appropriately with husband at her side.

## 2015-10-31 DIAGNOSIS — E119 Type 2 diabetes mellitus without complications: Secondary | ICD-10-CM | POA: Diagnosis not present

## 2015-11-06 DIAGNOSIS — H353231 Exudative age-related macular degeneration, bilateral, with active choroidal neovascularization: Secondary | ICD-10-CM | POA: Diagnosis not present

## 2015-11-14 DIAGNOSIS — Z1231 Encounter for screening mammogram for malignant neoplasm of breast: Secondary | ICD-10-CM | POA: Diagnosis not present

## 2015-12-03 ENCOUNTER — Ambulatory Visit (INDEPENDENT_AMBULATORY_CARE_PROVIDER_SITE_OTHER): Payer: Medicare Other | Admitting: Nurse Practitioner

## 2015-12-03 ENCOUNTER — Encounter: Payer: Self-pay | Admitting: Nurse Practitioner

## 2015-12-03 VITALS — BP 142/68 | HR 71 | Ht 64.0 in | Wt 148.8 lb

## 2015-12-03 DIAGNOSIS — I480 Paroxysmal atrial fibrillation: Secondary | ICD-10-CM | POA: Diagnosis not present

## 2015-12-03 DIAGNOSIS — Z720 Tobacco use: Secondary | ICD-10-CM

## 2015-12-03 DIAGNOSIS — R001 Bradycardia, unspecified: Secondary | ICD-10-CM

## 2015-12-03 DIAGNOSIS — I471 Supraventricular tachycardia: Secondary | ICD-10-CM | POA: Diagnosis not present

## 2015-12-03 DIAGNOSIS — I10 Essential (primary) hypertension: Secondary | ICD-10-CM

## 2015-12-03 MED ORDER — ASPIRIN EC 325 MG PO TBEC: 325.0000 mg | DELAYED_RELEASE_TABLET | Freq: Every day | ORAL | Status: DC

## 2015-12-03 NOTE — Patient Instructions (Addendum)
Medication Instructions:   Your physician recommends that you continue on your current medications as directed. Please refer to the Current Medication list given to you today.   If you need a refill on your cardiac medications before your next appointment, please call your pharmacy.  Labwork: NONE ORDER TODAY    Testing/Procedures: Your physician recommends that you continue on your current medications as directed. Please refer to the Current Medication list given to you today.    Follow-Up: 3 MONTH WITH DR Caryl Comes    Any Other Special Instructions Will Be Listed Below (If Applicable).

## 2015-12-03 NOTE — Progress Notes (Signed)
Electrophysiology Office Note Date: 12/03/2015  ID:  Shemiah, Deery 1940-11-12, MRN [id number]  PCP: Asencion Noble, MD Primary Cardiologist: Harl Bowie Electrophysiologist: Caryl Comes  CC: follow up atrial arrhythmias  Barbara [name] is a 75 y.o. female seen today for Dr Caryl Comes.  She presents today for routine electrophysiology followup.  Since last being seen in our clinic, the patient reports doing reasonably well.  She has intermittent palpitations for which she takes prn Metoprolol. Her shortness of breath is stable. She denies chest pain, palpitations, PND, orthopnea, nausea, vomiting, dizziness, syncope, edema, weight gain, or early satiety.  Past Medical History  Diagnosis Date  . Hypertension   . Hyperlipidemia     total cholesterol of 271 and LDL of 187 in 07/2010  . 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 Macular degeneration   . Diabetes mellitus     A1c of 7.6 in 04/2010; moderate to high dose insulin  . Tobacco abuse   . Asthma     Mild  . Palpitations   . Chest pain   . GERD (gastroesophageal reflux disease)   . Stroke [facility]) 1993 2008    short term memory loss  . Chronic back pain    Past Surgical History  Procedure Laterality Date  . Cataract extraction      left  . Appendectomy    . Tubal ligation    . Colonoscopy  2009  . Esophagogastroduodenoscopy  10/21/2011    Procedure: ESOPHAGOGASTRODUODENOSCOPY (EGD);  Surgeon: Rogene Houston, MD;  [facility];  Service: Endoscopy;  Laterality: N/A;  1200  . Colonoscopy  03/30/2012    Procedure: COLONOSCOPY;  Surgeon: Rogene Houston, MD;  [facility];  Service: Endoscopy;  Laterality: N/A;  730  . Colonoscopy N/A 12/13/2013    Procedure: COLONOSCOPY;  Surgeon: Rogene Houston, MD;  [facility];   Service: Endoscopy;  Laterality: N/A;  1200  . Esophagogastroduodenoscopy N/A 12/13/2013    Procedure: ESOPHAGOGASTRODUODENOSCOPY (EGD);  Surgeon: Rogene Houston, MD;  [facility];  Service: Endoscopy;  Laterality: N/A;    Current Outpatient Prescriptions  Medication Sig Dispense Refill  . Bevacizumab (AVASTIN) 100 MG/4ML SOLN Inject 1.25 mg into the vein every 8 (eight) weeks.    . brimonidine (ALPHAGAN) 0.2 % ophthalmic solution Place 1 drop into the right eye 3 (three) times daily.   1  . Calcium Carbonate Antacid (TUMS E-X SUGAR FREE PO) Take 2 tablets by mouth every morning.    . Cholecalciferol (VITAMIN D) 1000 UNITS capsule Take 1,000 Units by mouth at bedtime.     . flecainide (TAMBOCOR) 100 MG tablet Take 1 tablet (100 mg total) by mouth 2 (two) times daily. 180 tablet 3  . insulin glargine (LANTUS) 100 UNIT/ML injection Inject 25 Units into the skin daily.     . insulin regular (NOVOLIN R,HUMULIN R) 100 units/mL injection Inject 2-10 Units into the skin 3 (three) times daily before meals. SLIDING SCALE    . metoprolol tartrate (LOPRESSOR) 25 MG tablet Take 25 mg by mouth daily as needed (high heart rate).   7  . Multiple Vitamins-Minerals (PRESERVISION AREDS 2) CAPS Take 1 capsule by mouth 2 (two) times daily.    . nitroGLYCERIN (NITROSTAT) 0.4 MG SL tablet Place 0.4 mg under the tongue every  5 (five) minutes x 3 doses as needed for chest pain.     Vladimir Faster Glycol-Propyl Glycol (SYSTANE OP) Apply 1 drop to eye 2 (two) times daily.    Marland Kitchen PROAIR HFA 108 (90 Base) MCG/ACT inhaler Inhale 1 puff into the lungs as directed.  1  . vitamin C (ASCORBIC ACID) 500 MG tablet Take 1,000 mg by mouth at bedtime.     . vitamin E 1000 UNIT capsule Take 1,000 Units by mouth at bedtime.      No current facility-administered medications for this visit.    Allergies:   Bee venom; Sulfamethoxazole; Penicillins; and Sulfonamide derivatives   Social History: Social History   Social  History  . Marital Status: Married    Spouse Name: N/A  . Number of Children: 1  . Years of Education: N/A   Occupational History  . retired     Theme park manager   Social History Main Topics  . Smoking status: Current Every Day Smoker -- 0.25 packs/day for 15 years    Types: Cigarettes  . Smokeless tobacco: Never Used     Comment: QUIT SMOKING 6 months ago after smoking20 yrs  . Alcohol Use: No  . Drug Use: No  . Sexual Activity: Not Currently   Other Topics Concern  . Not on file   Social History Narrative    Family History: Family History  Problem Relation Age of Onset  . Heart attack Mother   . Cancer Father   . Cancer Sister   . Heart attack Brother   . Cancer Brother   . Cancer Sister   . Cancer Sister     Review of Systems: All other systems reviewed and are otherwise negative except as noted above.   Physical Exam: VS:  BP 142/68 mmHg  Pulse 71  Ht 5\' 4"  (1.626 m)  Wt 148 lb 12.8 oz (67.495 kg)  BMI 25.53 kg/m2 , BMI Body mass index is 25.53 kg/(m^2). Wt Readings from Last 3 Encounters:  12/03/15 148 lb 12.8 oz (67.495 kg)  10/14/15 150 lb (68.04 kg)  09/05/15 153 lb 9.6 oz (69.673 kg)    GEN- The patient is elderly appearing, alert and oriented x 3 today.   HEENT: normocephalic, atraumatic; sclera clear, conjunctiva pink; hearing intact; oropharynx clear; neck supple Lungs- normal work of breathing, coarse scattered rhonchi Heart- Regular rate and rhythm GI- soft, non-tender, non-distended, bowel sounds present Extremities- no clubbing, cyanosis, or edema; DP/PT/radial pulses 1+ bilaterally MS- no significant deformity or atrophy Skin- warm and dry, no rash or lesion  Psych- euthymic mood, full affect Neuro- strength and sensation are intact   EKG:  EKG is ordered today. The ekg ordered today shows sinus rhythm, normal intervals   Recent Labs: 07/28/2015: ALT 12; Platelets 280; TSH 6.81* 10/14/2015: BUN 14; Creatinine, Ser 0.90; Hemoglobin 15.3*;  Potassium 3.6; Sodium 140    Other studies Reviewed: Additional studies/ records that were reviewed today include: Dr Olin Pia office notes, holter monitors, GXT  Assessment and Plan: 1.  Atrial tachycardia Improved on Flecainide No scheduled BB or CCB per Dr Olin Pia last office note  2.  Paroxysmal atrial fibrillation She has had 2 prior GI bleeds on Eliquis CHADS2VASC is 7 We discussed Watchman today as potential option, she would like to defer for now, but I do think this is a good option as she can tolerate full dose ASA without bleeding. She can discuss further when she sees Dr Caryl Comes in follow up  3.  Sinus  bradycardia Improved off BB and CCB  4.  HTN Stable No change required today  5.  Tobacco abuse Trying to quit She would like to "do it on her own"   Current medicines are reviewed at length with the patient today.   The patient does not have concerns regarding her medicines.  The following changes were made today:  none  Labs/ tests ordered today include: none  No orders of the defined types were placed in this encounter.     Disposition:   Follow up with Dr Caryl Comes 3 months    Signed, [name], NP 12/03/2015 10:49 AM   [address] [place] [address] [phone] (office) [phone] (fax)

## 2015-12-27 DIAGNOSIS — E119 Type 2 diabetes mellitus without complications: Secondary | ICD-10-CM | POA: Diagnosis not present

## 2016-01-01 DIAGNOSIS — H353231 Exudative age-related macular degeneration, bilateral, with active choroidal neovascularization: Secondary | ICD-10-CM | POA: Diagnosis not present

## 2016-01-05 DIAGNOSIS — E119 Type 2 diabetes mellitus without complications: Secondary | ICD-10-CM | POA: Diagnosis not present

## 2016-01-30 DIAGNOSIS — E119 Type 2 diabetes mellitus without complications: Secondary | ICD-10-CM | POA: Diagnosis not present

## 2016-01-30 DIAGNOSIS — Z23 Encounter for immunization: Secondary | ICD-10-CM | POA: Diagnosis not present

## 2016-01-30 DIAGNOSIS — M546 Pain in thoracic spine: Secondary | ICD-10-CM | POA: Diagnosis not present

## 2016-01-30 DIAGNOSIS — I480 Paroxysmal atrial fibrillation: Secondary | ICD-10-CM | POA: Diagnosis not present

## 2016-03-02 DIAGNOSIS — E119 Type 2 diabetes mellitus without complications: Secondary | ICD-10-CM | POA: Diagnosis not present

## 2016-03-04 DIAGNOSIS — H40003 Preglaucoma, unspecified, bilateral: Secondary | ICD-10-CM | POA: Diagnosis not present

## 2016-03-04 DIAGNOSIS — H353231 Exudative age-related macular degeneration, bilateral, with active choroidal neovascularization: Secondary | ICD-10-CM | POA: Diagnosis not present

## 2016-03-04 DIAGNOSIS — Z961 Presence of intraocular lens: Secondary | ICD-10-CM | POA: Diagnosis not present

## 2016-03-08 ENCOUNTER — Ambulatory Visit: Payer: Medicare Other | Admitting: Internal Medicine

## 2016-04-22 DIAGNOSIS — H353211 Exudative age-related macular degeneration, right eye, with active choroidal neovascularization: Secondary | ICD-10-CM | POA: Diagnosis not present

## 2016-04-22 DIAGNOSIS — H353221 Exudative age-related macular degeneration, left eye, with active choroidal neovascularization: Secondary | ICD-10-CM | POA: Diagnosis not present

## 2016-05-04 DIAGNOSIS — E119 Type 2 diabetes mellitus without complications: Secondary | ICD-10-CM | POA: Diagnosis not present

## 2016-05-27 DIAGNOSIS — Z79899 Other long term (current) drug therapy: Secondary | ICD-10-CM | POA: Diagnosis not present

## 2016-05-27 DIAGNOSIS — G459 Transient cerebral ischemic attack, unspecified: Secondary | ICD-10-CM | POA: Diagnosis not present

## 2016-05-27 DIAGNOSIS — I10 Essential (primary) hypertension: Secondary | ICD-10-CM | POA: Diagnosis not present

## 2016-05-27 DIAGNOSIS — E119 Type 2 diabetes mellitus without complications: Secondary | ICD-10-CM | POA: Diagnosis not present

## 2016-05-27 DIAGNOSIS — I2510 Atherosclerotic heart disease of native coronary artery without angina pectoris: Secondary | ICD-10-CM | POA: Diagnosis not present

## 2016-05-27 DIAGNOSIS — E785 Hyperlipidemia, unspecified: Secondary | ICD-10-CM | POA: Diagnosis not present

## 2016-06-01 DIAGNOSIS — E785 Hyperlipidemia, unspecified: Secondary | ICD-10-CM | POA: Diagnosis not present

## 2016-06-01 DIAGNOSIS — E039 Hypothyroidism, unspecified: Secondary | ICD-10-CM | POA: Diagnosis not present

## 2016-06-01 DIAGNOSIS — E119 Type 2 diabetes mellitus without complications: Secondary | ICD-10-CM | POA: Diagnosis not present

## 2016-06-01 DIAGNOSIS — Z6826 Body mass index (BMI) 26.0-26.9, adult: Secondary | ICD-10-CM | POA: Diagnosis not present

## 2016-06-07 DIAGNOSIS — E119 Type 2 diabetes mellitus without complications: Secondary | ICD-10-CM | POA: Diagnosis not present

## 2016-06-09 ENCOUNTER — Ambulatory Visit: Payer: Medicare Other | Admitting: Internal Medicine

## 2016-06-11 ENCOUNTER — Ambulatory Visit (INDEPENDENT_AMBULATORY_CARE_PROVIDER_SITE_OTHER): Payer: Medicare Other | Admitting: Internal Medicine

## 2016-06-11 ENCOUNTER — Encounter: Payer: Self-pay | Admitting: Internal Medicine

## 2016-06-11 VITALS — BP 132/60 | HR 79 | Ht 62.0 in | Wt 145.8 lb

## 2016-06-11 DIAGNOSIS — R079 Chest pain, unspecified: Secondary | ICD-10-CM | POA: Diagnosis not present

## 2016-06-11 DIAGNOSIS — I471 Supraventricular tachycardia: Secondary | ICD-10-CM | POA: Diagnosis not present

## 2016-06-11 DIAGNOSIS — R001 Bradycardia, unspecified: Secondary | ICD-10-CM | POA: Diagnosis not present

## 2016-06-11 DIAGNOSIS — I480 Paroxysmal atrial fibrillation: Secondary | ICD-10-CM

## 2016-06-11 DIAGNOSIS — I639 Cerebral infarction, unspecified: Secondary | ICD-10-CM

## 2016-06-11 MED ORDER — ATORVASTATIN CALCIUM 80 MG PO TABS: ORAL_TABLET | ORAL | 6 refills | Status: DC

## 2016-06-11 NOTE — Progress Notes (Signed)
Patient Care Team: Asencion Noble, MD as PCP - General (Internal Medicine) Arnoldo Lenis, MD as Consulting Physician (Cardiology)   HPI  Barbara [name] is a 76 y.o. female Seen in follow-up for atrial fibrillation.  She had documented nonsustained atrial tachycardia but no atrial fibrillation.  30 day event recorder 11/15 demonstrated nonsustained atrial tachycardia  Holter monitoring 2017 demonstrated only nonsustained atrial tachycardia.  Some interval not several raised the issue of watchman. She had complex atrial ectopy and we elected to try her on apixoban; this was complicated by GI bleeding.  It was discontinued  We have treated her with flecainide without AV nodal blocking agents for her symptomatic atrial ectopy. AV nodal blockade was not necessary as an adjunct for her 1C therapy as atrial fibrillation has not been documented.   The bradycardia improved with discontinuation of CCB and BB    no interval problems with palpitations.   She did have an episode 2 week sago with severe chest pain associated with ear discomfort. She had spells of this chest discomfort before albeit not with exercise.  Myoview 10/15   was normal.        Past Medical History:  Diagnosis Date  . Arteriosclerotic cardiovascular disease (ASCVD)    non-obstructive; negative stress nuclear and normal echo in 08/2009 [facility] in 03/2000-20%  main 20% LAD, 30% circumflex, 30% RCA, hyperdynamic LV  . Asthma    Mild  . Chest pain   . Chronic back pain   . Diabetes mellitus    A1c of 7.6 in 04/2010; moderate to high dose insulin  . GERD (gastroesophageal reflux disease)   . Hyperlipidemia    total cholesterol of 271 and LDL of 187 in 07/2010  . Hypertension   . Macular degeneration   . Palpitations   . Stroke [facility]) 1993 2008   short term memory loss  . Tobacco abuse     Past Surgical History:  Procedure Laterality Date  . APPENDECTOMY    . CATARACT EXTRACTION       left  . COLONOSCOPY  2009  . COLONOSCOPY  03/30/2012   Procedure: COLONOSCOPY;  Surgeon: Rogene Houston, MD;  [facility];  Service: Endoscopy;  Laterality: N/A;  730  . COLONOSCOPY N/A 12/13/2013   Procedure: COLONOSCOPY;  Surgeon: Rogene Houston, MD;  [facility];  Service: Endoscopy;  Laterality: N/A;  1200  . ESOPHAGOGASTRODUODENOSCOPY  10/21/2011   Procedure: ESOPHAGOGASTRODUODENOSCOPY (EGD);  Surgeon: Rogene Houston, MD;  [facility];  Service: Endoscopy;  Laterality: N/A;  1200  . ESOPHAGOGASTRODUODENOSCOPY N/A 12/13/2013   Procedure: ESOPHAGOGASTRODUODENOSCOPY (EGD);  Surgeon: Rogene Houston, MD;  [facility];  Service: Endoscopy;  Laterality: N/A;  . TUBAL LIGATION      Current Outpatient Prescriptions  Medication Sig Dispense Refill  . aspirin EC 325 MG tablet Take 1 tablet (325 mg total) by mouth daily. 30 tablet 0  . Bevacizumab (AVASTIN) 100 MG/4ML SOLN Inject 1.25 mg into the vein every 8 (eight) weeks.    . brimonidine (ALPHAGAN) 0.2 % ophthalmic solution Place 1 drop into the right eye 3 (three) times daily.   1  . Calcium Carbonate Antacid (TUMS E-X SUGAR FREE PO) Take 2 tablets by mouth every morning.    . Cholecalciferol (VITAMIN D) 1000 UNITS capsule Take 1,000 Units by mouth at bedtime.     . flecainide (TAMBOCOR) 100 MG tablet Take 1 tablet (  100 mg total) by mouth 2 (two) times daily. 180 tablet 3  . insulin glargine (LANTUS) 100 UNIT/ML injection Inject 25 Units into the skin daily.     . insulin regular (NOVOLIN R,HUMULIN R) 100 units/mL injection Inject 2-10 Units into the skin 3 (three) times daily before meals. SLIDING SCALE    . metoprolol tartrate (LOPRESSOR) 25 MG tablet Take 25 mg by mouth daily as needed (high heart rate).   7  . Multiple Vitamins-Minerals (PRESERVISION AREDS 2) CAPS Take 1 capsule by mouth 2 (two) times daily.    . nitroGLYCERIN (NITROSTAT) 0.4 MG SL tablet Place 0.4 mg under the tongue every  5 (five) minutes x 3 doses as needed for chest pain.     Vladimir Faster Glycol-Propyl Glycol (SYSTANE OP) Apply 1 drop to eye 2 (two) times daily.    Marland Kitchen PROAIR HFA 108 (90 Base) MCG/ACT inhaler Inhale 1 puff into the lungs as directed.  1  . vitamin C (ASCORBIC ACID) 500 MG tablet Take 1,000 mg by mouth at bedtime.     . vitamin E 1000 UNIT capsule Take 1,000 Units by mouth at bedtime.      No current facility-administered medications for this visit.     Allergies  Allergen Reactions  . Bee Venom Anaphylaxis  . Sulfamethoxazole Nausea And Vomiting  . Penicillins Itching and Rash       . Sulfonamide Derivatives Nausea And Vomiting    Review of Systems negative except from HPI and PMH  Physical Exam BP 132/60   Pulse 79   Ht 5\' 2"  (1.575 m)   Wt 145 lb 12.8 oz (66.1 kg)   SpO2 98%   BMI 26.67 kg/m  Well developed and well nourished in no acute distress HENT normal E scleral and icterus clear Neck Supple JVP flat; carotids brisk and full Clear to ausculation regular rate and rhythm, no murmurs gallops or rub Soft with active bowel sounds No clubbing cyanosis  Edema Alert and oriented, grossly normal motor and sensory function Skin Warm and Dry  ECG  07/28/15 demonstrates sinus rhythm at 66 with frequent PACs and nonsustained atrial tachycardia  Intervals 14/07/36 Axis 54    Assessment and  Plan  Atrial tachycardia-nonsustained complex atrial ectopy  Hypertension  dyslipidemia   Fatigue  Daytime somnlonece and sleep disordered breathing  Sinus bradycardia  Orthostatic lightheadedness  History of GI bleeding recurrent on apixoban  eval neg   Atrial tachycardia seems much less prevalent by auscultation; she still continues to have symptoms at home with fast rates and slow rates. We'll discontinue the metoprolol and the calcium blocker; it's adjunct use with flecainide Is   not necessary and she does not have a fibrillation post conversion atrial flutter is not a  concern   Given her bradycardia we will stop her calcium blocker and her beta blocker.  Will start her on low dose statin atorva 40 x 2 week  With her chest pain episode, we are too late to do even a tail troponin , we will undertake a Myoview as she is on flecainide .  With her history of strokes,  We will repeat her carotid Dopplers, the last was done 2004

## 2016-06-11 NOTE — Patient Instructions (Addendum)
Medication Instructions: -Your physician has recommended you make the following change in your medication:  1) Start lipitor (atorvastatin) 80 mg- take 1/2 tablet (40 mg) by mouth twice weekly x 2 weeks & if you feel like you are tolerating this, then increase to a whole pill (80 mg) twice weekly  Labwork: - none ordered  Procedures/Testing: - Your physician has requested that you have a Lexicographer (@ [employer]). For further information please visit [email or web address]. Please follow instruction sheet, as given.  - Your physician has requested that you have a carotid duplex (@ [employer]). This test is an ultrasound of the carotid arteries in your neck. It looks at blood flow through these arteries that supply the brain with blood. Allow one hour for this exam. There are no restrictions or special instructions.  Follow-Up: - Your physician wants you to follow-up in: 1 year with Dr. Caryl Comes. You will receive a reminder letter in the mail two months in advance. If you don't receive a letter, please call our office to schedule the follow-up appointment.   Any Additional Special Instructions Will Be Listed Below (If Applicable).     If you need a refill on your cardiac medications before your next appointment, please call your pharmacy.

## 2016-06-16 ENCOUNTER — Encounter (HOSPITAL_COMMUNITY)
Admission: RE | Admit: 2016-06-16 | Discharge: 2016-06-16 | Disposition: A | Discharge status: Home / Self Care | Payer: Medicare Other | Source: Ambulatory Visit | Attending: Internal Medicine | Admitting: Internal Medicine

## 2016-06-16 ENCOUNTER — Inpatient Hospital Stay (HOSPITAL_COMMUNITY): Admission: RE | Admit: 2016-06-16 | Payer: Medicare Other | Source: Ambulatory Visit

## 2016-06-16 ENCOUNTER — Encounter (HOSPITAL_COMMUNITY): Payer: Self-pay

## 2016-06-16 DIAGNOSIS — R079 Chest pain, unspecified: Secondary | ICD-10-CM | POA: Diagnosis not present

## 2016-06-16 LAB — NM MYOCAR MULTI W/SPECT W/WALL MOTION / EF
CSEPPHR: 85 {beats}/min
LVDIAVOL: 45 mL (ref 46–106)
LVSYSVOL: 12 mL
RATE: 0.24
Rest HR: 64 {beats}/min
SDS: 0
SRS: 4
SSS: 4
TID: 0.95

## 2016-06-16 MED ORDER — TECHNETIUM TC 99M TETROFOSMIN IV KIT
10.0000 | PACK | Freq: Once | INTRAVENOUS | Status: AC | PRN
  Administered 2016-06-16: 10 via INTRAVENOUS

## 2016-06-16 MED ORDER — TECHNETIUM TC 99M TETROFOSMIN IV KIT
30.0000 | PACK | Freq: Once | INTRAVENOUS | Status: AC | PRN
  Administered 2016-06-16: 30 via INTRAVENOUS

## 2016-06-16 MED ORDER — REGADENOSON 0.4 MG/5ML IV SOLN
INTRAVENOUS | Status: AC
  Administered 2016-06-16: 0.4 mg via INTRAVENOUS
  Filled 2016-06-16: qty 5

## 2016-06-16 MED ORDER — SODIUM CHLORIDE 0.9% FLUSH
INTRAVENOUS | Status: AC
  Administered 2016-06-16: 10 mL via INTRAVENOUS
  Filled 2016-06-16: qty 10

## 2016-06-17 ENCOUNTER — Ambulatory Visit (HOSPITAL_COMMUNITY): Admission: RE | Admit: 2016-06-17 | Payer: Medicare Other | Source: Ambulatory Visit

## 2016-06-23 ENCOUNTER — Ambulatory Visit (HOSPITAL_COMMUNITY)
Admission: RE | Admit: 2016-06-23 | Discharge: 2016-06-23 | Disposition: A | Discharge status: Home / Self Care | Payer: Medicare Other | Source: Ambulatory Visit | Attending: Internal Medicine | Admitting: Internal Medicine

## 2016-06-23 DIAGNOSIS — I639 Cerebral infarction, unspecified: Secondary | ICD-10-CM

## 2016-06-23 DIAGNOSIS — I6523 Occlusion and stenosis of bilateral carotid arteries: Secondary | ICD-10-CM | POA: Diagnosis not present

## 2016-06-23 DIAGNOSIS — I63233 Cerebral infarction due to unspecified occlusion or stenosis of bilateral carotid arteries: Secondary | ICD-10-CM | POA: Diagnosis not present

## 2016-06-24 DIAGNOSIS — H353231 Exudative age-related macular degeneration, bilateral, with active choroidal neovascularization: Secondary | ICD-10-CM | POA: Diagnosis not present

## 2016-07-06 ENCOUNTER — Telehealth: Payer: Self-pay | Admitting: Internal Medicine

## 2016-07-06 NOTE — Telephone Encounter (Signed)
New Message    Pt states that she has not received the results for the labs that she had at [facility].

## 2016-07-06 NOTE — Telephone Encounter (Signed)
I called and spoke with the patient- she is aware of her carotid US results and need to repeat this in 1 year.

## 2016-08-03 ENCOUNTER — Ambulatory Visit: Payer: Medicare Other | Admitting: Internal Medicine

## 2016-08-05 DIAGNOSIS — H353231 Exudative age-related macular degeneration, bilateral, with active choroidal neovascularization: Secondary | ICD-10-CM | POA: Diagnosis not present

## 2016-08-05 DIAGNOSIS — Z961 Presence of intraocular lens: Secondary | ICD-10-CM | POA: Diagnosis not present

## 2016-08-05 DIAGNOSIS — H40003 Preglaucoma, unspecified, bilateral: Secondary | ICD-10-CM | POA: Diagnosis not present

## 2016-08-05 DIAGNOSIS — E119 Type 2 diabetes mellitus without complications: Secondary | ICD-10-CM | POA: Diagnosis not present

## 2016-08-19 ENCOUNTER — Other Ambulatory Visit: Payer: Self-pay | Admitting: Internal Medicine

## 2016-09-16 DIAGNOSIS — H353231 Exudative age-related macular degeneration, bilateral, with active choroidal neovascularization: Secondary | ICD-10-CM | POA: Diagnosis not present

## 2016-09-22 DIAGNOSIS — E119 Type 2 diabetes mellitus without complications: Secondary | ICD-10-CM | POA: Diagnosis not present

## 2016-09-24 DIAGNOSIS — E119 Type 2 diabetes mellitus without complications: Secondary | ICD-10-CM | POA: Diagnosis not present

## 2016-09-30 DIAGNOSIS — E119 Type 2 diabetes mellitus without complications: Secondary | ICD-10-CM | POA: Diagnosis not present

## 2016-09-30 DIAGNOSIS — I480 Paroxysmal atrial fibrillation: Secondary | ICD-10-CM | POA: Diagnosis not present

## 2016-10-01 DIAGNOSIS — E119 Type 2 diabetes mellitus without complications: Secondary | ICD-10-CM | POA: Diagnosis not present

## 2016-10-05 ENCOUNTER — Other Ambulatory Visit (HOSPITAL_COMMUNITY): Payer: Self-pay | Admitting: Internal Medicine

## 2016-10-05 DIAGNOSIS — Z780 Asymptomatic menopausal state: Secondary | ICD-10-CM

## 2016-10-21 ENCOUNTER — Ambulatory Visit (HOSPITAL_COMMUNITY)
Admission: RE | Admit: 2016-10-21 | Discharge: 2016-10-21 | Disposition: A | Discharge status: Home / Self Care | Payer: Medicare Other | Source: Ambulatory Visit | Attending: Internal Medicine | Admitting: Internal Medicine

## 2016-10-21 DIAGNOSIS — F17210 Nicotine dependence, cigarettes, uncomplicated: Secondary | ICD-10-CM | POA: Diagnosis not present

## 2016-10-21 DIAGNOSIS — M810 Age-related osteoporosis without current pathological fracture: Secondary | ICD-10-CM | POA: Insufficient documentation

## 2016-10-21 DIAGNOSIS — Z794 Long term (current) use of insulin: Secondary | ICD-10-CM | POA: Insufficient documentation

## 2016-10-21 DIAGNOSIS — E119 Type 2 diabetes mellitus without complications: Secondary | ICD-10-CM | POA: Diagnosis not present

## 2016-10-21 DIAGNOSIS — Z780 Asymptomatic menopausal state: Secondary | ICD-10-CM

## 2016-11-02 DIAGNOSIS — H5203 Hypermetropia, bilateral: Secondary | ICD-10-CM | POA: Diagnosis not present

## 2016-11-04 DIAGNOSIS — H353231 Exudative age-related macular degeneration, bilateral, with active choroidal neovascularization: Secondary | ICD-10-CM | POA: Diagnosis not present

## 2016-11-16 DIAGNOSIS — Z1231 Encounter for screening mammogram for malignant neoplasm of breast: Secondary | ICD-10-CM | POA: Diagnosis not present

## 2016-12-09 DIAGNOSIS — E119 Type 2 diabetes mellitus without complications: Secondary | ICD-10-CM | POA: Diagnosis not present

## 2016-12-23 DIAGNOSIS — H35361 Drusen (degenerative) of macula, right eye: Secondary | ICD-10-CM | POA: Diagnosis not present

## 2016-12-23 DIAGNOSIS — H353231 Exudative age-related macular degeneration, bilateral, with active choroidal neovascularization: Secondary | ICD-10-CM | POA: Diagnosis not present

## 2017-01-17 DIAGNOSIS — E119 Type 2 diabetes mellitus without complications: Secondary | ICD-10-CM | POA: Diagnosis not present

## 2017-02-10 DIAGNOSIS — H353231 Exudative age-related macular degeneration, bilateral, with active choroidal neovascularization: Secondary | ICD-10-CM | POA: Diagnosis not present

## 2017-02-16 DIAGNOSIS — E1129 Type 2 diabetes mellitus with other diabetic kidney complication: Secondary | ICD-10-CM | POA: Diagnosis not present

## 2017-02-21 DIAGNOSIS — E119 Type 2 diabetes mellitus without complications: Secondary | ICD-10-CM | POA: Diagnosis not present

## 2017-02-22 DIAGNOSIS — Z23 Encounter for immunization: Secondary | ICD-10-CM | POA: Diagnosis not present

## 2017-02-22 DIAGNOSIS — E119 Type 2 diabetes mellitus without complications: Secondary | ICD-10-CM | POA: Diagnosis not present

## 2017-02-22 DIAGNOSIS — I480 Paroxysmal atrial fibrillation: Secondary | ICD-10-CM | POA: Diagnosis not present

## 2017-03-31 DIAGNOSIS — H353231 Exudative age-related macular degeneration, bilateral, with active choroidal neovascularization: Secondary | ICD-10-CM | POA: Diagnosis not present

## 2017-04-04 DIAGNOSIS — E119 Type 2 diabetes mellitus without complications: Secondary | ICD-10-CM | POA: Diagnosis not present

## 2017-05-09 DIAGNOSIS — E119 Type 2 diabetes mellitus without complications: Secondary | ICD-10-CM | POA: Diagnosis not present

## 2017-05-13 ENCOUNTER — Other Ambulatory Visit: Payer: Self-pay | Admitting: Internal Medicine

## 2017-05-26 DIAGNOSIS — H353231 Exudative age-related macular degeneration, bilateral, with active choroidal neovascularization: Secondary | ICD-10-CM | POA: Diagnosis not present

## 2017-06-06 DIAGNOSIS — E119 Type 2 diabetes mellitus without complications: Secondary | ICD-10-CM | POA: Diagnosis not present

## 2017-06-27 DIAGNOSIS — E785 Hyperlipidemia, unspecified: Secondary | ICD-10-CM | POA: Diagnosis not present

## 2017-06-27 DIAGNOSIS — I2510 Atherosclerotic heart disease of native coronary artery without angina pectoris: Secondary | ICD-10-CM | POA: Diagnosis not present

## 2017-06-27 DIAGNOSIS — I10 Essential (primary) hypertension: Secondary | ICD-10-CM | POA: Diagnosis not present

## 2017-06-27 DIAGNOSIS — Z79899 Other long term (current) drug therapy: Secondary | ICD-10-CM | POA: Diagnosis not present

## 2017-06-30 ENCOUNTER — Ambulatory Visit: Payer: Medicare Other | Admitting: Internal Medicine

## 2017-07-05 DIAGNOSIS — E785 Hyperlipidemia, unspecified: Secondary | ICD-10-CM | POA: Diagnosis not present

## 2017-07-05 DIAGNOSIS — E119 Type 2 diabetes mellitus without complications: Secondary | ICD-10-CM | POA: Diagnosis not present

## 2017-07-05 DIAGNOSIS — I480 Paroxysmal atrial fibrillation: Secondary | ICD-10-CM | POA: Diagnosis not present

## 2017-07-05 DIAGNOSIS — Z6825 Body mass index (BMI) 25.0-25.9, adult: Secondary | ICD-10-CM | POA: Diagnosis not present

## 2017-07-07 ENCOUNTER — Ambulatory Visit: Payer: Medicare Other | Admitting: Internal Medicine

## 2017-07-07 ENCOUNTER — Encounter: Payer: Self-pay | Admitting: Internal Medicine

## 2017-07-07 VITALS — BP 136/60 | HR 70 | Ht 63.5 in | Wt 140.8 lb

## 2017-07-07 DIAGNOSIS — I495 Sick sinus syndrome: Secondary | ICD-10-CM

## 2017-07-07 DIAGNOSIS — I480 Paroxysmal atrial fibrillation: Secondary | ICD-10-CM

## 2017-07-07 MED ORDER — ATORVASTATIN CALCIUM 80 MG PO TABS: 80.0000 mg | ORAL_TABLET | Freq: Every day | ORAL | 3 refills | Status: DC

## 2017-07-07 MED ORDER — ASPIRIN EC 81 MG PO TBEC: 81.0000 mg | DELAYED_RELEASE_TABLET | Freq: Every day | ORAL | 3 refills | Status: DC

## 2017-07-07 NOTE — Patient Instructions (Addendum)
Medication Instructions:  Your physician has recommended you make the following change in your medication:   Begin Lipitor 80mg  tablet every other day. Reduce Aspirin to 81mg   Labwork: None ordered.  Testing/Procedures: Dr Caryl Comes wants you to have a repeat Carotid Doppler scheduled   Follow-Up: Your physician recommends that you schedule a follow-up appointment in: 6 months with Barbara Standard, PA  Any Other Special Instructions Will Be Listed Below (If Applicable).   Smoking Tobacco Information Smoking tobacco will very likely harm your health. Tobacco contains a poisonous (toxic), colorless chemical called nicotine. Nicotine affects the brain and makes tobacco addictive. This change in your brain can make it hard to stop smoking. Tobacco also has other toxic chemicals that can hurt your body and raise your risk of many cancers. How can smoking tobacco affect me? Smoking tobacco can increase your chances of having serious health conditions, such as:  Cancer. Smoking is most commonly associated with lung cancer, but can lead to cancer in other parts of the body.  Chronic obstructive pulmonary disease (COPD). This is a long-term lung condition that makes it hard to breathe. It also gets worse over time.  High blood pressure (hypertension), heart disease, stroke, or heart attack.  Lung infections, such as pneumonia.  Cataracts. This is when the lenses in the eyes become clouded.  Digestive problems. This may include peptic ulcers, heartburn, and gastroesophageal reflux disease (GERD).  Oral health problems, such as gum disease and tooth loss.  Loss of taste and smell.  Smoking can affect your appearance by causing:  Wrinkles.  Yellow or stained teeth, fingers, and fingernails.  Smoking tobacco can also affect your social life.  Many workplaces, [employer], hotels, and public places are tobacco-free. This means that you may experience challenges in finding places to smoke  when away from home.  The cost of a smoking habit can be expensive. Expenses for someone who smokes come in two ways: ? You spend money on a regular basis to buy tobacco. ? Your health care costs in the long-term are higher if you smoke.  Tobacco smoke can also affect the health of those around you. Children of smokers have greater chances of: ? Sudden infant death syndrome (SIDS). ? Ear infections. ? Lung infections.  What lifestyle changes can be made?  Do not start smoking. Quit if you already do.  To quit smoking: ? Make a plan to quit smoking and commit yourself to it. Look for programs to help you and ask your health care provider for recommendations and ideas. ? Talk with your health care provider about using nicotine replacement medicines to help you quit. Medicine replacement medicines include gum, lozenges, patches, sprays, or pills. ? Do not replace cigarette smoking with electronic cigarettes, which are commonly called e-cigarettes. The safety of e-cigarettes is not known, and some may contain harmful chemicals. ? Avoid places, people, or situations that tempt you to smoke. ? If you try to quit but return to smoking, don't give up hope. It is very common for people to try a number of times before they fully succeed. When you feel ready again, give it another try.  Quitting smoking might affect the way you eat as well as your weight. Be prepared to monitor your eating habits. Get support in planning and following a healthy diet.  Ask your health care provider about having regular tests (screenings) to check for cancer. This may include blood tests, imaging tests, and other tests.  Exercise regularly. Consider taking walks,  joining a gym, or doing yoga or exercise classes.  Develop skills to manage your stress. These skills include meditation. What are the benefits of quitting smoking? By quitting smoking, you may:  Lower your risk of getting cancer and other diseases caused  by smoking.  Live longer.  Breathe better.  Lower your blood pressure and heart rate.  Stop your addiction to tobacco.  Stop creating secondhand smoke that hurts other people.  Improve your sense of taste and smell.  Look better over time, due to having fewer wrinkles and less staining.  What can happen if changes are not made? If you do not stop smoking, you may:  Get cancer and other diseases.  Develop COPD or other long-term (chronic) lung conditions.  Develop serious problems with your heart and blood vessels (cardiovascular system).  Need more tests to screen for problems caused by smoking.  Have higher, long-term healthcare costs from medicines or treatments related to smoking.  Continue to have worsening changes in your lungs, mouth, and nose.  Where to find support: To get support to quit smoking, consider:  Asking your health care provider for more information and resources.  Taking classes to learn more about quitting smoking.  Looking for local organizations that offer resources about quitting smoking.  Joining a support group for people who want to quit smoking in your local community.  Where to find more information: You may find more information about quitting smoking from:  HelpGuide.org: www.helpguide.org/articles/addictions/how-to-quit-smoking.htm  https://hall.com/: smokefree.gov  American Lung Association: www.lung.org  Contact a health care provider if:  You have problems breathing.  Your lips, nose, or fingers turn blue.  You have chest pain.  You are coughing up blood.  You feel faint or you pass out.  You have other noticeable changes that cause you to worry. Summary  Smoking tobacco can negatively affect your health, the health of those around you, your finances, and your social life.  Do not start smoking. Quit if you already do. If you need help quitting, ask your health care provider.  Think about joining a support group for  people who want to quit smoking in your local community. There are many effective programs that will help you to quit this behavior. This information is not intended to replace advice given to you by your health care provider. Make sure you discuss any questions you have with your health care provider. Document Released: 05/25/2016 Document Revised: 05/25/2016 Document Reviewed: 05/25/2016 Elsevier Interactive Patient Education  [employer].      If you need a refill on your cardiac medications before your next appointment, please call your pharmacy.

## 2017-07-07 NOTE — Progress Notes (Signed)
carotid 

## 2017-07-07 NOTE — Progress Notes (Signed)
Patient Care Team: Asencion Noble, MD as PCP - General (Internal Medicine) Harl Bowie Alphonse Guild, MD as Consulting Physician (Cardiology)   HPI  Barbara [name] is a 77 y.o. female Seen in follow-up for atrial fibrillation.  She had documented nonsustained atrial tachycardia but no atrial fibrillation.  30 day event recorder 11/15 demonstrated nonsustained atrial tachycardia  Holter monitoring 2017 demonstrated only nonsustained atrial tachycardia.  Some interval not several raised the issue of watchman. She had complex atrial ectopy and we elected to try her on apixoban; this was complicated by GI bleeding.  It was discontinued   She denies chest pain or shortness of breath.  She has had scant palpitations.  No edema nocturnal dyspnea orthopnea.  Tolerating flecainide without difficulty.  Carotid Dopplers 1/18 bifurcation right-sided stenosis 50-69  Myoview 10/15   was normal.   Date Cr K LDL  2/19 0.85 4.8 177             Past Medical History:  Diagnosis Date  . Arteriosclerotic cardiovascular disease (ASCVD)    non-obstructive; negative stress nuclear and normal echo in 08/2009 by [facility] cardiac cath in 03/2000-20%  main 20% LAD, 30% circumflex, 30% RCA, hyperdynamic LV  . Asthma    Mild  . Chest pain   . Chronic back pain   . Diabetes mellitus    A1c of 7.6 in 04/2010; moderate to high dose insulin  . GERD (gastroesophageal reflux disease)   . Hyperlipidemia    total cholesterol of 271 and LDL of 187 in 07/2010  . Hypertension   . Macular degeneration   . Palpitations   . Stroke [facility]) 1993 2008   short term memory loss  . Tobacco abuse     Past Surgical History:  Procedure Laterality Date  . APPENDECTOMY    . CATARACT EXTRACTION     left  . COLONOSCOPY  2009  . COLONOSCOPY  03/30/2012   Procedure: COLONOSCOPY;  Surgeon: Rogene Houston, MD;  [facility];  Service: Endoscopy;  Laterality: N/A;  730  . COLONOSCOPY N/A 12/13/2013   Procedure: COLONOSCOPY;  Surgeon: Rogene Houston, MD;  [facility];  Service: Endoscopy;  Laterality: N/A;  1200  . ESOPHAGOGASTRODUODENOSCOPY  10/21/2011   Procedure: ESOPHAGOGASTRODUODENOSCOPY (EGD);  Surgeon: Rogene Houston, MD;  [facility];  Service: Endoscopy;  Laterality: N/A;  1200  . ESOPHAGOGASTRODUODENOSCOPY N/A 12/13/2013   Procedure: ESOPHAGOGASTRODUODENOSCOPY (EGD);  Surgeon: Rogene Houston, MD;  [facility];  Service: Endoscopy;  Laterality: N/A;  . TUBAL LIGATION      Current Outpatient Medications  Medication Sig Dispense Refill  . aspirin EC 325 MG tablet Take 1 tablet (325 mg total) by mouth daily. 30 tablet 0  . Bevacizumab (AVASTIN) 100 MG/4ML SOLN Inject 1.25 mg into the vein every 8 (eight) weeks.    . brimonidine (ALPHAGAN) 0.2 % ophthalmic solution Place 1 drop into both eyes 3 (three) times daily.   1  . Calcium Carbonate Antacid (TUMS E-X SUGAR FREE PO) Take 2 tablets by mouth every morning.    . Cholecalciferol (VITAMIN D) 1000 UNITS capsule Take 1,000 Units by mouth at bedtime.     . flecainide (TAMBOCOR) 100 MG tablet TAKE 1 TABLET BY MOUTH TWICE DAILY 180 tablet 0  . insulin glargine (LANTUS) 100 UNIT/ML injection Inject 20 Units into the skin daily.     . insulin regular (NOVOLIN R,HUMULIN R) 100 units/mL injection Inject 2-10 Units  into the skin 3 (three) times daily before meals. SLIDING SCALE    . metoprolol tartrate (LOPRESSOR) 25 MG tablet Take 25 mg by mouth daily as needed (high heart rate).   7  . Multiple Vitamins-Minerals (PRESERVISION AREDS 2) CAPS Take 1 capsule by mouth 2 (two) times daily.    . nitroGLYCERIN (NITROSTAT) 0.4 MG SL tablet Place 0.4 mg under the tongue every 5 (five) minutes x 3 doses as needed for chest pain.     Vladimir Faster Glycol-Propyl Glycol (SYSTANE OP) Apply 1 drop to eye 2 (two) times daily.    Marland Kitchen PROAIR HFA 108 (90 Base) MCG/ACT inhaler Inhale 1 puff into the lungs as directed.  1  .  vitamin C (ASCORBIC ACID) 500 MG tablet Take 1,000 mg by mouth at bedtime.     . vitamin E 1000 UNIT capsule Take 1,000 Units by mouth at bedtime.      No current facility-administered medications for this visit.     Allergies  Allergen Reactions  . Bee Venom Anaphylaxis  . Sulfamethoxazole Nausea And Vomiting  . Penicillins Itching and Rash       . Sulfonamide Derivatives Nausea And Vomiting    Review of Systems negative except from HPI and PMH  Physical Exam BP 136/60   Pulse 70   Ht 5' 3.5" (1.613 m)   Wt 140 lb 12.8 oz (63.9 kg)   BMI 24.55 kg/m  Well developed and nourished in no acute distress HENT normal Neck supple with JVP-flat Clear Regular rate and rhythm, no murmurs or gallops Abd-soft with active BS No Clubbing cyanosis edema Skin-warm and dry A & Oriented  Grossly normal sensory and motor function  ECG sinus at 70 Interval 16/08/42 Nonspecific ST-T changes  Assessment and  Plan  Atrial tachycardia-nonsustained complex atrial ectopy  Hypertension  Dyslipidemia   Sinus bradycardia   History of GI bleeding recurrent on apixoban  eval neg  Tobacco abuse  Carotid stenosis 50-69 1/18  We will reduce her aspirin to 81 mg  I will have her resume her statin twice a week to see if she can tolerate it.  Encouraged her to stop smoking.  BP under good control   Continue flecainide.  Repeat carotids

## 2017-07-12 ENCOUNTER — Inpatient Hospital Stay (HOSPITAL_COMMUNITY): Admission: RE | Admit: 2017-07-12 | Payer: Medicare Other | Source: Ambulatory Visit

## 2017-07-14 ENCOUNTER — Ambulatory Visit (INDEPENDENT_AMBULATORY_CARE_PROVIDER_SITE_OTHER): Payer: Medicare Other

## 2017-07-14 DIAGNOSIS — I495 Sick sinus syndrome: Secondary | ICD-10-CM | POA: Diagnosis not present

## 2017-07-14 DIAGNOSIS — I480 Paroxysmal atrial fibrillation: Secondary | ICD-10-CM | POA: Diagnosis not present

## 2017-07-14 LAB — VAS US CAROTID
LCCAPDIAS: 17 cm/s
LEFT ECA DIAS: -12 cm/s
LEFT VERTEBRAL DIAS: -25 cm/s
LICAPDIAS: 31 cm/s
Left CCA dist dias: -16 cm/s
Left CCA dist sys: -71 cm/s
Left CCA prox sys: 108 cm/s
Left ICA dist dias: -15 cm/s
Left ICA dist sys: -66 cm/s
Left ICA prox sys: 127 cm/s
RCCADSYS: -81 cm/s
RCCAPDIAS: 0 cm/s
RIGHT ECA DIAS: 0 cm/s
RIGHT VERTEBRAL DIAS: -12 cm/s
Right CCA prox sys: 112 cm/s

## 2017-07-28 DIAGNOSIS — H40003 Preglaucoma, unspecified, bilateral: Secondary | ICD-10-CM | POA: Diagnosis not present

## 2017-07-28 DIAGNOSIS — Z961 Presence of intraocular lens: Secondary | ICD-10-CM | POA: Diagnosis not present

## 2017-07-28 DIAGNOSIS — H353231 Exudative age-related macular degeneration, bilateral, with active choroidal neovascularization: Secondary | ICD-10-CM | POA: Diagnosis not present

## 2017-08-07 DIAGNOSIS — E119 Type 2 diabetes mellitus without complications: Secondary | ICD-10-CM | POA: Diagnosis not present

## 2017-08-19 ENCOUNTER — Other Ambulatory Visit: Payer: Self-pay | Admitting: Internal Medicine

## 2017-09-01 DIAGNOSIS — E119 Type 2 diabetes mellitus without complications: Secondary | ICD-10-CM | POA: Diagnosis not present

## 2017-09-08 DIAGNOSIS — H353221 Exudative age-related macular degeneration, left eye, with active choroidal neovascularization: Secondary | ICD-10-CM | POA: Diagnosis not present

## 2017-09-08 DIAGNOSIS — H35723 Serous detachment of retinal pigment epithelium, bilateral: Secondary | ICD-10-CM | POA: Diagnosis not present

## 2017-09-08 DIAGNOSIS — H353211 Exudative age-related macular degeneration, right eye, with active choroidal neovascularization: Secondary | ICD-10-CM | POA: Diagnosis not present

## 2017-09-18 ENCOUNTER — Other Ambulatory Visit: Payer: Self-pay

## 2017-09-18 ENCOUNTER — Emergency Department (HOSPITAL_COMMUNITY)
Admission: EM | Admit: 2017-09-18 | Discharge: 2017-09-18 | Disposition: A | Discharge status: Home / Self Care | Payer: Medicare Other | Attending: Emergency Medicine | Admitting: Emergency Medicine

## 2017-09-18 ENCOUNTER — Encounter (HOSPITAL_COMMUNITY): Payer: Self-pay | Admitting: Emergency Medicine

## 2017-09-18 ENCOUNTER — Emergency Department (HOSPITAL_COMMUNITY): Payer: Medicare Other

## 2017-09-18 DIAGNOSIS — E119 Type 2 diabetes mellitus without complications: Secondary | ICD-10-CM | POA: Diagnosis not present

## 2017-09-18 DIAGNOSIS — R1032 Left lower quadrant pain: Secondary | ICD-10-CM | POA: Diagnosis present

## 2017-09-18 DIAGNOSIS — I10 Essential (primary) hypertension: Secondary | ICD-10-CM | POA: Insufficient documentation

## 2017-09-18 DIAGNOSIS — Z794 Long term (current) use of insulin: Secondary | ICD-10-CM | POA: Insufficient documentation

## 2017-09-18 DIAGNOSIS — R1084 Generalized abdominal pain: Secondary | ICD-10-CM | POA: Insufficient documentation

## 2017-09-18 DIAGNOSIS — Z79899 Other long term (current) drug therapy: Secondary | ICD-10-CM | POA: Insufficient documentation

## 2017-09-18 DIAGNOSIS — J45909 Unspecified asthma, uncomplicated: Secondary | ICD-10-CM | POA: Insufficient documentation

## 2017-09-18 DIAGNOSIS — R110 Nausea: Secondary | ICD-10-CM | POA: Diagnosis not present

## 2017-09-18 DIAGNOSIS — Z7982 Long term (current) use of aspirin: Secondary | ICD-10-CM | POA: Diagnosis not present

## 2017-09-18 DIAGNOSIS — F17210 Nicotine dependence, cigarettes, uncomplicated: Secondary | ICD-10-CM | POA: Insufficient documentation

## 2017-09-18 DIAGNOSIS — K5730 Diverticulosis of large intestine without perforation or abscess without bleeding: Secondary | ICD-10-CM | POA: Diagnosis not present

## 2017-09-18 LAB — URINALYSIS, ROUTINE W REFLEX MICROSCOPIC
Bilirubin Urine: NEGATIVE
GLUCOSE, UA: NEGATIVE mg/dL
HGB URINE DIPSTICK: NEGATIVE
KETONES UR: NEGATIVE mg/dL
Leukocytes, UA: NEGATIVE
Nitrite: NEGATIVE
PROTEIN: NEGATIVE mg/dL
Specific Gravity, Urine: 1.018 (ref 1.005–1.030)
pH: 5 (ref 5.0–8.0)

## 2017-09-18 LAB — CBC WITH DIFFERENTIAL/PLATELET
Basophils Absolute: 0 10*3/uL (ref 0.0–0.1)
Basophils Relative: 0 %
EOS ABS: 0.1 10*3/uL (ref 0.0–0.7)
EOS PCT: 0 %
HCT: 40.5 % (ref 36.0–46.0)
Hemoglobin: 12.9 g/dL (ref 12.0–15.0)
Lymphocytes Relative: 5 %
Lymphs Abs: 1 10*3/uL (ref 0.7–4.0)
MCH: 29.3 pg (ref 26.0–34.0)
MCHC: 31.9 g/dL (ref 30.0–36.0)
MCV: 91.8 fL (ref 78.0–100.0)
MONO ABS: 2 10*3/uL — AB (ref 0.1–1.0)
MONOS PCT: 10 %
Neutro Abs: 16.5 10*3/uL — ABNORMAL HIGH (ref 1.7–7.7)
Neutrophils Relative %: 85 %
PLATELETS: 252 10*3/uL (ref 150–400)
RBC: 4.41 MIL/uL (ref 3.87–5.11)
RDW: 12.9 % (ref 11.5–15.5)
WBC: 19.5 10*3/uL — ABNORMAL HIGH (ref 4.0–10.5)

## 2017-09-18 LAB — COMPREHENSIVE METABOLIC PANEL
ALK PHOS: 70 U/L (ref 38–126)
ALT: 16 U/L (ref 14–54)
AST: 26 U/L (ref 15–41)
Albumin: 3.9 g/dL (ref 3.5–5.0)
Anion gap: 12 (ref 5–15)
BILIRUBIN TOTAL: 0.6 mg/dL (ref 0.3–1.2)
BUN: 19 mg/dL (ref 6–20)
CALCIUM: 9.1 mg/dL (ref 8.9–10.3)
CO2: 23 mmol/L (ref 22–32)
CREATININE: 0.92 mg/dL (ref 0.44–1.00)
Chloride: 102 mmol/L (ref 101–111)
GFR calc non Af Amer: 60 mL/min — ABNORMAL LOW (ref >60)
Glucose, Bld: 117 mg/dL — ABNORMAL HIGH (ref 65–99)
Potassium: 3.8 mmol/L (ref 3.5–5.1)
Sodium: 137 mmol/L (ref 135–145)
TOTAL PROTEIN: 6.6 g/dL (ref 6.5–8.1)

## 2017-09-18 LAB — LIPASE, BLOOD: Lipase: 39 U/L (ref 11–51)

## 2017-09-18 MED ORDER — ONDANSETRON HCL 4 MG/2ML IJ SOLN
4.0000 mg | Freq: Once | INTRAMUSCULAR | Status: AC
  Administered 2017-09-18: 4 mg via INTRAVENOUS
  Filled 2017-09-18: qty 2

## 2017-09-18 MED ORDER — ONDANSETRON 4 MG PO TBDP: 4.0000 mg | ORAL_TABLET | Freq: Three times a day (TID) | ORAL | 1 refills | Status: DC | PRN

## 2017-09-18 MED ORDER — HYDROMORPHONE HCL 1 MG/ML IJ SOLN: 1.0000 mg | Freq: Once | INTRAMUSCULAR | Status: DC

## 2017-09-18 MED ORDER — SODIUM CHLORIDE 0.9 % IV SOLN
INTRAVENOUS | Status: DC
  Administered 2017-09-18: 19:00:00 via INTRAVENOUS

## 2017-09-18 MED ORDER — HYDROMORPHONE HCL 1 MG/ML IJ SOLN
0.5000 mg | Freq: Once | INTRAMUSCULAR | Status: AC
  Administered 2017-09-18: 0.5 mg via INTRAVENOUS
  Filled 2017-09-18: qty 1

## 2017-09-18 MED ORDER — HYDROCODONE-ACETAMINOPHEN 5-325 MG PO TABS: 1.0000 | ORAL_TABLET | Freq: Four times a day (QID) | ORAL | 0 refills | Status: DC | PRN

## 2017-09-18 MED ORDER — ONDANSETRON 4 MG PO TBDP
4.0000 mg | ORAL_TABLET | Freq: Once | ORAL | Status: AC
  Administered 2017-09-18: 4 mg via ORAL
  Filled 2017-09-18: qty 1

## 2017-09-18 MED ORDER — IOPAMIDOL (ISOVUE-300) INJECTION 61%
100.0000 mL | Freq: Once | INTRAVENOUS | Status: AC | PRN
  Administered 2017-09-18: 100 mL via INTRAVENOUS

## 2017-09-18 MED ORDER — HYDROCODONE-ACETAMINOPHEN 5-325 MG PO TABS
1.0000 | ORAL_TABLET | Freq: Once | ORAL | Status: AC
  Administered 2017-09-18: 1 via ORAL
  Filled 2017-09-18: qty 1

## 2017-09-18 NOTE — ED Triage Notes (Signed)
Patient c/o left lower abd/suprapubic pain that started this morning. Per patient some nausea but no vomiting. Denies any diarrhea, urinary symptoms, fevers, or vaginal bleeding.

## 2017-09-18 NOTE — Discharge Instructions (Addendum)
Work-up for the abdominal pain without any acute findings.  Other than a marked leukocytosis with your white blood cell count being 19,000.  CT scan negative.  Take the pain medicine and the antinausea medicine as directed.  Return for any new or worse symptoms.  Make an appointment to follow-up with your doctor or your gastroenterologist to have things rechecked and also recommend having your white blood cell count rechecked.

## 2017-09-18 NOTE — ED Provider Notes (Signed)
[facility] EMERGENCY DEPARTMENT Provider Note   CSN: [id number] Arrival date & time: 09/18/17  1704     History   Chief Complaint Chief Complaint  Patient presents with  . Abdominal Pain    HPI Barbara [name] is a 77 y.o. female.  Patient followed by Dr. Willey Blade also followed by gastroenterologist Dr. Melony Overly.  Patient with complaint of left lower quadrant abdominal pain that started this morning patient said approximately 1030.  Associated with nausea but no vomiting.  Pain is now spread to the rest of the abdomen.  She has not had anything like this before.  Denies any dysuria no diarrhea no vaginal bleeding no fevers.     Past Medical History:  Diagnosis Date  . Arteriosclerotic cardiovascular disease (ASCVD)    non-obstructive; negative stress nuclear and normal echo in 08/2009 by [facility] cardiac cath in 03/2000-20%  main 20% LAD, 30% circumflex, 30% RCA, hyperdynamic LV  . Asthma    Mild  . Chest pain   . Chronic back pain   . Diabetes mellitus    A1c of 7.6 in 04/2010; moderate to high dose insulin  . GERD (gastroesophageal reflux disease)   . Hyperlipidemia    total cholesterol of 271 and LDL of 187 in 07/2010  . Hypertension   . Macular degeneration   . Palpitations   . Stroke [facility]) 1993 2008   short term memory loss  . Tobacco abuse     Patient Active Problem List   Diagnosis Date Noted  . Dizziness 07/28/2015  . Orthostatic hypotension 07/28/2015  . Rectal bleeding 12/05/2013  . Melena 12/05/2013  . Personal history of colonic polyps 12/05/2013  . Rapid atrial fibrillation ([place]) 08/15/2013  . Atrial fibrillation with RVR ([place]) 08/15/2013  . Right upper quadrant pain 11/16/2011  . Abdominal pain, other specified site 09/01/2011  . Cerebrovascular disease 10/09/2010  . Hypertension   . Hyperlipidemia   . Diabetes mellitus ([place])   . Arteriosclerotic cardiovascular disease (ASCVD)   . Macular degeneration   . Tobacco abuse   . Asthma   .  Palpitations   . Chest pain     Past Surgical History:  Procedure Laterality Date  . APPENDECTOMY    . CATARACT EXTRACTION     left  . COLONOSCOPY  2009  . COLONOSCOPY  03/30/2012   Procedure: COLONOSCOPY;  Surgeon: Rogene Houston, MD;  [facility];  Service: Endoscopy;  Laterality: N/A;  730  . COLONOSCOPY N/A 12/13/2013   Procedure: COLONOSCOPY;  Surgeon: Rogene Houston, MD;  [facility];  Service: Endoscopy;  Laterality: N/A;  1200  . ESOPHAGOGASTRODUODENOSCOPY  10/21/2011   Procedure: ESOPHAGOGASTRODUODENOSCOPY (EGD);  Surgeon: Rogene Houston, MD;  [facility];  Service: Endoscopy;  Laterality: N/A;  1200  . ESOPHAGOGASTRODUODENOSCOPY N/A 12/13/2013   Procedure: ESOPHAGOGASTRODUODENOSCOPY (EGD);  Surgeon: Rogene Houston, MD;  [facility];  Service: Endoscopy;  Laterality: N/A;  . TUBAL LIGATION       OB History   None      Home Medications    Prior to Admission medications   Medication Sig Start Date End Date Taking? Authorizing Provider  aspirin EC 325 MG tablet Take 325 mg by mouth every evening.   Yes [provider]  Bevacizumab (AVASTIN) 100 MG/4ML SOLN Inject 1.25 mg into the vein every 8 (eight) weeks.   Yes [provider]  brimonidine (ALPHAGAN) 0.2 % ophthalmic solution Place 1 drop into both  eyes 3 (three) times daily.  01/31/15  Yes [provider]  Calcium Carbonate Antacid (TUMS E-X SUGAR FREE PO) Take 2 tablets by mouth every morning.   Yes [provider]  Cholecalciferol (VITAMIN D) 1000 UNITS capsule Take 1,000 Units by mouth every morning.    Yes [provider]  flecainide (TAMBOCOR) 100 MG tablet TAKE 1 TABLET BY MOUTH TWICE DAILY 08/19/17  Yes Deboraha Sprang, MD  insulin glargine (LANTUS) 100 UNIT/ML injection Inject 20 Units into the skin every morning.  08/16/13  Yes Asencion Noble, MD  insulin regular (NOVOLIN R,HUMULIN R) 100 units/mL injection Inject 2-10 Units  into the skin 3 (three) times daily as needed for high blood sugar (per sliding scale). SLIDING SCALE    Yes [provider]  lisinopril (PRINIVIL,ZESTRIL) 5 MG tablet Take 2.5 mg by mouth every evening.  09/15/17  Yes [provider]  metoprolol tartrate (LOPRESSOR) 25 MG tablet Take 25 mg by mouth daily as needed (high heart rate).  08/18/15  Yes [provider]  Multiple Vitamins-Minerals (PRESERVISION AREDS 2) CAPS Take 1 capsule by mouth 2 (two) times daily.   Yes [provider]  nitroGLYCERIN (NITROSTAT) 0.4 MG SL tablet Place 0.4 mg under the tongue every 5 (five) minutes x 3 doses as needed for chest pain.  06/02/11  Yes Rothbart, Cristopher Estimable, MD  Polyethyl Glycol-Propyl Glycol (SYSTANE OP) Apply 1 drop to eye 2 (two) times daily.   Yes [provider]  PROAIR HFA 108 (90 Base) MCG/ACT inhaler Inhale 1 puff into the lungs every 6 (six) hours as needed for wheezing or shortness of breath.  08/12/15  Yes [provider]  vitamin C (ASCORBIC ACID) 500 MG tablet Take 1,000 mg by mouth at bedtime.    Yes [provider]  vitamin E 1000 UNIT capsule Take 1,000 Units by mouth at bedtime.    Yes [provider]    Family History Family History  Problem Relation Age of Onset  . Heart attack Mother   . Cancer Father   . Cancer Sister   . Heart attack Brother   . Cancer Brother   . Cancer Sister   . Cancer Sister     Social History Social History   Tobacco Use  . Smoking status: Current Every Day Smoker    Packs/day: 0.25    Years: 15.00    Pack years: 3.75    Types: Cigarettes  . Smokeless tobacco: Never Used  Substance Use Topics  . Alcohol use: No  . Drug use: No     Allergies   Bee venom; Sulfamethoxazole; Penicillins; and Sulfonamide derivatives   Review of Systems Review of Systems  Constitutional: Negative for fever.  HENT: Negative for congestion.   Eyes: Negative for redness.  Respiratory: Negative  for shortness of breath.   Cardiovascular: Negative for chest pain.  Gastrointestinal: Positive for abdominal pain and nausea. Negative for blood in stool and vomiting.  Genitourinary: Negative for dysuria.  Musculoskeletal: Negative for back pain.  Skin: Negative for rash.  Neurological: Negative for syncope and headaches.  Hematological: Does not bruise/bleed easily.  Psychiatric/Behavioral: Negative for confusion.     Physical Exam Updated Vital Signs BP (!) 131/45 (BP Location: Left Arm)   Pulse 76   Temp 98.4 F (36.9 C) (Oral)   Resp 15   Ht 1.626 m (5\' 4" )   Wt 61.7 kg (136 lb)   SpO2 97%   BMI 23.34 kg/m  Physical Exam  Constitutional: She is oriented to person, place, and time. She appears well-developed and well-nourished. No distress.  HENT:  Head: Normocephalic.  Mouth/Throat: Oropharynx is clear and moist.  Eyes: Pupils are equal, round, and reactive to light. Conjunctivae and EOM are normal.  Neck: Neck supple.  Cardiovascular: Normal rate, regular rhythm and normal heart sounds.  Pulmonary/Chest: Effort normal and breath sounds normal. No respiratory distress.  Abdominal: Soft. Bowel sounds are normal. There is tenderness. There is no guarding.  Generalized tenderness no guarding  Musculoskeletal: Normal range of motion.  Neurological: She is alert and oriented to person, place, and time. No cranial nerve deficit or sensory deficit. She exhibits normal muscle tone. Coordination normal.  Skin: Skin is warm.  Nursing note and vitals reviewed.    ED Treatments / Results  Labs (all labs ordered are listed, but only abnormal results are displayed) Labs Reviewed  COMPREHENSIVE METABOLIC PANEL - Abnormal; Notable for the following components:      Result Value   Glucose, Bld 117 (*)    GFR calc non Af Amer 60 (*)    All other components within normal limits  CBC WITH DIFFERENTIAL/PLATELET - Abnormal; Notable for the following components:   WBC 19.5 (*)      Neutro Abs 16.5 (*)    Monocytes Absolute 2.0 (*)    All other components within normal limits  URINALYSIS, ROUTINE W REFLEX MICROSCOPIC  LIPASE, BLOOD    EKG None  Radiology No results found.  Procedures Procedures (including critical care time)  Medications Ordered in ED Medications  0.9 %  sodium chloride infusion ( Intravenous New Bag/Given 09/18/17 1927)  iopamidol (ISOVUE-300) 61 % injection 100 mL (has no administration in time range)  ondansetron (ZOFRAN) injection 4 mg (4 mg Intravenous Given 09/18/17 1926)  HYDROmorphone (DILAUDID) injection 0.5 mg (0.5 mg Intravenous Given 09/18/17 1927)     Initial Impression / Assessment and Plan / ED Course  I have reviewed the triage vital signs and the nursing notes.  Pertinent labs & imaging results that were available during my care of the patient were reviewed by me and considered in my medical decision making (see chart for details).     Patient symptoms and onset of pain in the left lower quadrant highly suggestive of diverticulitis.  Patient's been told in the past she has diverticulosis.  Also has a marked leukocytosis with white blood cell count of 19,000.  Otherwise electrolytes and LFTs without significant abnormalities.  CT scan of abdomen ordered to delineate the abdominal tenderness.  CT scan still pending.  Patient improved with pain medicine and antinausea medicine here.  CT scan negative other than finding of diverticulosis no evidence of diverticulitis.  Patient does have a marked white blood cell count.  Not clear why.  Will not start antibiotics empirically.  We will have her follow-up with her doctor and gastroenterologist.  We will treat symptomatically with hydrocodone and Zofran.  Patient will return for any new or worse symptoms to include fever.  Final Clinical Impressions(s) / ED Diagnoses   Final diagnoses:  None    ED Discharge Orders    None       Fredia Sorrow, MD 09/18/17 2157

## 2017-09-20 ENCOUNTER — Ambulatory Visit: Payer: Medicare Other | Admitting: General Surgery

## 2017-09-20 ENCOUNTER — Encounter: Payer: Self-pay | Admitting: General Surgery

## 2017-09-20 VITALS — BP 149/55 | HR 75 | Temp 98.7°F | Ht 63.0 in | Wt 137.0 lb

## 2017-09-20 DIAGNOSIS — K5730 Diverticulosis of large intestine without perforation or abscess without bleeding: Secondary | ICD-10-CM

## 2017-09-20 MED ORDER — METRONIDAZOLE 250 MG PO TABS: 250.0000 mg | ORAL_TABLET | Freq: Three times a day (TID) | ORAL | 0 refills | Status: DC

## 2017-09-20 MED ORDER — CIPROFLOXACIN HCL 500 MG PO TABS: 500.0000 mg | ORAL_TABLET | Freq: Two times a day (BID) | ORAL | 0 refills | Status: DC

## 2017-09-20 NOTE — Progress Notes (Addendum)
Barbara [name]; [id number]; 1940-09-09   HPI Patient is a 77 year old white female who was referred to my care by Dr. Willey Blade for evaluation treatment of left lower quadrant abdominal pain.  Patient was recently seen in the emergency room for the acute onset of left lower quadrant abdominal pain.  She has known history of diverticulosis.  She last had a colonoscopy in 2013 by Dr. Laural Golden.  Patient denies any fevers, though occasionally has chills.  She was noted to have a leukocytosis.  CT scan of the abdomen pelvis was unremarkable for diverticulitis or an acute process within the abdomen.  Patient states that the pain is unrelated to bowel movements.  The pain sort of occurs along the lower portion of her abdomen and waves.  She denies any nausea or vomiting.  She was discharged from the emergency room without antibiotic coverage.  She denies any blood per rectum.  As the pain has persisted, she was referred to my care for further evaluation and treatment. Past Medical History:  Diagnosis Date  . Arteriosclerotic cardiovascular disease (ASCVD)    non-obstructive; negative stress nuclear and normal echo in 08/2009 by [facility] in 03/2000-20%  main 20% LAD, 30% circumflex, 30% RCA, hyperdynamic LV  . Asthma    Mild  . Chest pain   . Chronic back pain   . Diabetes mellitus    A1c of 7.6 in 04/2010; moderate to high dose insulin  . GERD (gastroesophageal reflux disease)   . Hyperlipidemia    total cholesterol of 271 and LDL of 187 in 07/2010  . Hypertension   . Macular degeneration   . Palpitations   . Stroke [facility]) 1993 2008   short term memory loss  . Tobacco abuse     Past Surgical History:  Procedure Laterality Date  . APPENDECTOMY    . CATARACT EXTRACTION     left  . COLONOSCOPY  2009  . COLONOSCOPY  03/30/2012   Procedure: COLONOSCOPY;  Surgeon: Rogene Houston, MD;  [facility];  Service: Endoscopy;  Laterality: N/A;  730  . COLONOSCOPY N/A 12/13/2013    Procedure: COLONOSCOPY;  Surgeon: Rogene Houston, MD;  [facility];  Service: Endoscopy;  Laterality: N/A;  1200  . ESOPHAGOGASTRODUODENOSCOPY  10/21/2011   Procedure: ESOPHAGOGASTRODUODENOSCOPY (EGD);  Surgeon: Rogene Houston, MD;  [facility];  Service: Endoscopy;  Laterality: N/A;  1200  . ESOPHAGOGASTRODUODENOSCOPY N/A 12/13/2013   Procedure: ESOPHAGOGASTRODUODENOSCOPY (EGD);  Surgeon: Rogene Houston, MD;  [facility];  Service: Endoscopy;  Laterality: N/A;  . TUBAL LIGATION      Family History  Problem Relation Age of Onset  . Heart attack Mother   . Cancer Father   . Cancer Sister   . Heart attack Brother   . Cancer Brother   . Cancer Sister   . Cancer Sister     Current Outpatient Medications on File Prior to Visit  Medication Sig Dispense Refill  . aspirin EC 325 MG tablet Take 325 mg by mouth every evening.    . Bevacizumab (AVASTIN) 100 MG/4ML SOLN Inject 1.25 mg into the vein every 8 (eight) weeks.    . brimonidine (ALPHAGAN) 0.2 % ophthalmic solution Place 1 drop into both eyes 3 (three) times daily.   1  . Calcium Carbonate Antacid (TUMS E-X SUGAR FREE PO) Take 2 tablets by mouth every morning.    . Cholecalciferol (VITAMIN D) 1000 UNITS capsule Take 1,000 Units by mouth  every morning.     . flecainide (TAMBOCOR) 100 MG tablet TAKE 1 TABLET BY MOUTH TWICE DAILY 180 tablet 3  . HYDROcodone-acetaminophen (NORCO/VICODIN) 5-325 MG tablet Take 1-2 tablets by mouth every 6 (six) hours as needed. 10 tablet 0  . insulin glargine (LANTUS) 100 UNIT/ML injection Inject 20 Units into the skin every morning.     . insulin regular (NOVOLIN R,HUMULIN R) 100 units/mL injection Inject 2-10 Units into the skin 3 (three) times daily as needed for high blood sugar (per sliding scale). SLIDING SCALE     . lisinopril (PRINIVIL,ZESTRIL) 5 MG tablet Take 2.5 mg by mouth every evening.   4  . metoprolol tartrate (LOPRESSOR) 25 MG tablet Take 25 mg by mouth  daily as needed (high heart rate).   7  . Multiple Vitamins-Minerals (PRESERVISION AREDS 2) CAPS Take 1 capsule by mouth 2 (two) times daily.    . nitroGLYCERIN (NITROSTAT) 0.4 MG SL tablet Place 0.4 mg under the tongue every 5 (five) minutes x 3 doses as needed for chest pain.     Marland Kitchen ondansetron (ZOFRAN ODT) 4 MG disintegrating tablet Take 1 tablet (4 mg total) by mouth every 8 (eight) hours as needed. 10 tablet 1  . Polyethyl Glycol-Propyl Glycol (SYSTANE OP) Apply 1 drop to eye 2 (two) times daily.    Marland Kitchen PROAIR HFA 108 (90 Base) MCG/ACT inhaler Inhale 1 puff into the lungs every 6 (six) hours as needed for wheezing or shortness of breath.   1  . vitamin C (ASCORBIC ACID) 500 MG tablet Take 1,000 mg by mouth at bedtime.     . vitamin E 1000 UNIT capsule Take 1,000 Units by mouth at bedtime.      No current facility-administered medications on file prior to visit.       Social History   Substance and Sexual Activity  Alcohol Use No    Social History   Tobacco Use  Smoking Status Current Every Day Smoker  . Packs/day: 0.25  . Years: 15.00  . Pack years: 3.75  . Types: Cigarettes  Smokeless Tobacco Never Used    Review of Systems  Constitutional: Positive for chills and malaise/fatigue.  Eyes: Negative.   Respiratory: Negative.   Cardiovascular: Negative.   Gastrointestinal: Positive for abdominal pain. Negative for nausea and vomiting.  Genitourinary: Negative.   Musculoskeletal: Positive for myalgias.  Skin: Negative.   Neurological: Negative.   Endo/Heme/Allergies: Negative.   Psychiatric/Behavioral: Negative.     Objective   Vitals:   09/20/17 1142  BP: (!) 149/55  Pulse: 75  Temp: 98.7 F (37.1 C)    Physical Exam  Constitutional: She is oriented to person, place, and time. She appears well-developed and well-nourished.  HENT:  Head: Normocephalic and atraumatic.  Cardiovascular: Normal rate, regular rhythm and normal heart sounds.  No murmur  heard. Pulmonary/Chest: Effort normal and breath sounds normal. No respiratory distress. She has no wheezes. She has no rales.  Abdominal: Soft. Normal appearance and bowel sounds are normal. She exhibits no distension. There is tenderness.  Patient does have some tenderness in the left lower quadrant, but she also has migratory tenderness in other quadrants of the abdomen.  She does not have any rigidity.  No hernias are noted.  The tenderness is nonspecific.  Neurological: She is alert and oriented to person, place, and time.  Skin: Skin is warm and dry.  Vitals reviewed. ER note reviewed CT scan images personally reviewed  Assessment  Abdominal pain of unknown  etiology, leukocytosis, history of diverticulosis Plan   Even though the patient does not have x-ray findings of diverticulitis, she does have leukocytosis and I am going to presumptively treat with ciprofloxacin and Flagyl.  No need for acute surgical intervention at this time.  I will see her back in my office in 2 weeks for follow-up.  I discussed this with Dr. Willey Blade.

## 2017-09-20 NOTE — Patient Instructions (Signed)
Diverticulosis  Diverticulosis is a condition that develops when small pouches (diverticula) form in the wall of the large intestine (colon). The colon is where water is absorbed and stool is formed. The pouches form when the inside layer of the colon pushes through weak spots in the outer layers of the colon. You may have a few pouches or many of them.  What are the causes?  The cause of this condition is not known.  What increases the risk?  The following factors may make you more likely to develop this condition:   Being older than age 60. Your risk for this condition increases with age. Diverticulosis is rare among people younger than age 30. By age 80, many people have it.   Eating a low-fiber diet.   Having frequent constipation.   Being overweight.   Not getting enough exercise.   Smoking.   Taking over-the-counter pain medicines, like aspirin and ibuprofen.   Having a family history of diverticulosis.    What are the signs or symptoms?  In most people, there are no symptoms of this condition. If you do have symptoms, they may include:   Bloating.   Cramps in the abdomen.   Constipation or diarrhea.   Pain in the lower left side of the abdomen.    How is this diagnosed?  This condition is most often diagnosed during an exam for other colon problems. Because diverticulosis usually has no symptoms, it often cannot be diagnosed independently. This condition may be diagnosed by:   Using a flexible scope to examine the colon (colonoscopy).   Taking an X-ray of the colon after dye has been put into the colon (barium enema).   Doing a CT scan.    How is this treated?  You may not need treatment for this condition if you have never developed an infection related to diverticulosis. If you have had an infection before, treatment may include:   Eating a high-fiber diet. This may include eating more fruits, vegetables, and grains.   Taking a fiber supplement.   Taking a live bacteria supplement  (probiotic).   Taking medicine to relax your colon.   Taking antibiotic medicines.    Follow these instructions at home:   Drink 6-8 glasses of water or more each day to prevent constipation.   Try not to strain when you have a bowel movement.   If you have had an infection before:  ? Eat more fiber as directed by your health care provider or your diet and nutrition specialist (dietitian).  ? Take a fiber supplement or probiotic, if your health care provider approves.   Take over-the-counter and prescription medicines only as told by your health care provider.   If you were prescribed an antibiotic, take it as told by your health care provider. Do not stop taking the antibiotic even if you start to feel better.   Keep all follow-up visits as told by your health care provider. This is important.  Contact a health care provider if:   You have pain in your abdomen.   You have bloating.   You have cramps.   You have not had a bowel movement in 3 days.  Get help right away if:   Your pain gets worse.   Your bloating becomes very bad.   You have a fever or chills, and your symptoms suddenly get worse.   You vomit.   You have bowel movements that are bloody or black.   You have   bleeding from your rectum.  Summary   Diverticulosis is a condition that develops when small pouches (diverticula) form in the wall of the large intestine (colon).   You may have a few pouches or many of them.   This condition is most often diagnosed during an exam for other colon problems.   If you have had an infection related to diverticulosis, treatment may include increasing the fiber in your diet, taking supplements, or taking medicines.  This information is not intended to replace advice given to you by your health care provider. Make sure you discuss any questions you have with your health care provider.  Document Released: 02/05/2004 Document Revised: 03/29/2016 Document Reviewed: 03/29/2016  Elsevier Interactive  Patient Education  2017 [employer]vier Inc[employer]

## 2017-09-23 ENCOUNTER — Encounter (HOSPITAL_COMMUNITY): Payer: Self-pay

## 2017-09-23 ENCOUNTER — Other Ambulatory Visit: Payer: Self-pay

## 2017-09-23 ENCOUNTER — Observation Stay (HOSPITAL_COMMUNITY): Payer: Medicare Other

## 2017-09-23 ENCOUNTER — Inpatient Hospital Stay (HOSPITAL_COMMUNITY)
Admission: AD | Admit: 2017-09-23 | Discharge: 2017-09-25 | DRG: 392 | Disposition: A | Discharge status: Home / Self Care | Payer: Medicare Other | Source: Ambulatory Visit | Attending: Internal Medicine | Admitting: Internal Medicine

## 2017-09-23 DIAGNOSIS — F17210 Nicotine dependence, cigarettes, uncomplicated: Secondary | ICD-10-CM | POA: Diagnosis present

## 2017-09-23 DIAGNOSIS — K5720 Diverticulitis of large intestine with perforation and abscess without bleeding: Principal | ICD-10-CM | POA: Diagnosis present

## 2017-09-23 DIAGNOSIS — Z880 Allergy status to penicillin: Secondary | ICD-10-CM

## 2017-09-23 DIAGNOSIS — Z79899 Other long term (current) drug therapy: Secondary | ICD-10-CM | POA: Diagnosis not present

## 2017-09-23 DIAGNOSIS — I4891 Unspecified atrial fibrillation: Secondary | ICD-10-CM | POA: Diagnosis present

## 2017-09-23 DIAGNOSIS — I2510 Atherosclerotic heart disease of native coronary artery without angina pectoris: Secondary | ICD-10-CM | POA: Diagnosis present

## 2017-09-23 DIAGNOSIS — R109 Unspecified abdominal pain: Secondary | ICD-10-CM | POA: Diagnosis not present

## 2017-09-23 DIAGNOSIS — Z8673 Personal history of transient ischemic attack (TIA), and cerebral infarction without residual deficits: Secondary | ICD-10-CM

## 2017-09-23 DIAGNOSIS — K219 Gastro-esophageal reflux disease without esophagitis: Secondary | ICD-10-CM | POA: Diagnosis present

## 2017-09-23 DIAGNOSIS — E785 Hyperlipidemia, unspecified: Secondary | ICD-10-CM | POA: Diagnosis present

## 2017-09-23 DIAGNOSIS — Z91030 Bee allergy status: Secondary | ICD-10-CM

## 2017-09-23 DIAGNOSIS — Z882 Allergy status to sulfonamides status: Secondary | ICD-10-CM

## 2017-09-23 DIAGNOSIS — E119 Type 2 diabetes mellitus without complications: Secondary | ICD-10-CM | POA: Diagnosis not present

## 2017-09-23 DIAGNOSIS — Z794 Long term (current) use of insulin: Secondary | ICD-10-CM | POA: Diagnosis not present

## 2017-09-23 DIAGNOSIS — I10 Essential (primary) hypertension: Secondary | ICD-10-CM | POA: Diagnosis not present

## 2017-09-23 DIAGNOSIS — H3530 Unspecified macular degeneration: Secondary | ICD-10-CM | POA: Diagnosis present

## 2017-09-23 DIAGNOSIS — Z7982 Long term (current) use of aspirin: Secondary | ICD-10-CM

## 2017-09-23 DIAGNOSIS — E781 Pure hyperglyceridemia: Secondary | ICD-10-CM | POA: Diagnosis not present

## 2017-09-23 DIAGNOSIS — K5740 Diverticulitis of both small and large intestine with perforation and abscess without bleeding: Secondary | ICD-10-CM | POA: Diagnosis present

## 2017-09-23 DIAGNOSIS — K5792 Diverticulitis of intestine, part unspecified, without perforation or abscess without bleeding: Secondary | ICD-10-CM | POA: Diagnosis not present

## 2017-09-23 DIAGNOSIS — E138 Other specified diabetes mellitus with unspecified complications: Secondary | ICD-10-CM | POA: Diagnosis not present

## 2017-09-23 DIAGNOSIS — J45909 Unspecified asthma, uncomplicated: Secondary | ICD-10-CM | POA: Diagnosis present

## 2017-09-23 DIAGNOSIS — R1032 Left lower quadrant pain: Secondary | ICD-10-CM | POA: Diagnosis not present

## 2017-09-23 LAB — URINALYSIS, COMPLETE (UACMP) WITH MICROSCOPIC
Bacteria, UA: NONE SEEN
Bilirubin Urine: NEGATIVE
Glucose, UA: NEGATIVE mg/dL
KETONES UR: NEGATIVE mg/dL
LEUKOCYTES UA: NEGATIVE
Nitrite: NEGATIVE
Protein, ur: NEGATIVE mg/dL
Specific Gravity, Urine: 1.026 (ref 1.005–1.030)
pH: 6 (ref 5.0–8.0)

## 2017-09-23 LAB — CBC WITH DIFFERENTIAL/PLATELET
BASOS PCT: 1 %
Basophils Absolute: 0.1 10*3/uL (ref 0.0–0.1)
EOS PCT: 5 %
Eosinophils Absolute: 0.4 10*3/uL (ref 0.0–0.7)
HEMATOCRIT: 34.6 % — AB (ref 36.0–46.0)
Hemoglobin: 11.4 g/dL — ABNORMAL LOW (ref 12.0–15.0)
Lymphocytes Relative: 20 %
Lymphs Abs: 1.7 10*3/uL (ref 0.7–4.0)
MCH: 30.2 pg (ref 26.0–34.0)
MCHC: 32.9 g/dL (ref 30.0–36.0)
MCV: 91.8 fL (ref 78.0–100.0)
MONO ABS: 1.1 10*3/uL — AB (ref 0.1–1.0)
MONOS PCT: 12 %
NEUTROS ABS: 5.5 10*3/uL (ref 1.7–7.7)
Neutrophils Relative %: 62 %
Platelets: 290 10*3/uL (ref 150–400)
RBC: 3.77 MIL/uL — ABNORMAL LOW (ref 3.87–5.11)
RDW: 13 % (ref 11.5–15.5)
WBC: 8.7 10*3/uL (ref 4.0–10.5)

## 2017-09-23 LAB — COMPREHENSIVE METABOLIC PANEL
ALBUMIN: 3.4 g/dL — AB (ref 3.5–5.0)
ALT: 19 U/L (ref 14–54)
ANION GAP: 11 (ref 5–15)
AST: 24 U/L (ref 15–41)
Alkaline Phosphatase: 123 U/L (ref 38–126)
BILIRUBIN TOTAL: 0.5 mg/dL (ref 0.3–1.2)
BUN: 9 mg/dL (ref 6–20)
CO2: 25 mmol/L (ref 22–32)
Calcium: 8.9 mg/dL (ref 8.9–10.3)
Chloride: 100 mmol/L — ABNORMAL LOW (ref 101–111)
Creatinine, Ser: 0.79 mg/dL (ref 0.44–1.00)
GFR calc Af Amer: >60 mL/min (ref >60)
GFR calc non Af Amer: >60 mL/min (ref >60)
GLUCOSE: 50 mg/dL — AB (ref 65–99)
POTASSIUM: 3.9 mmol/L (ref 3.5–5.1)
Sodium: 136 mmol/L (ref 135–145)
TOTAL PROTEIN: 6.7 g/dL (ref 6.5–8.1)

## 2017-09-23 LAB — GLUCOSE, CAPILLARY: GLUCOSE-CAPILLARY: 96 mg/dL (ref 65–99)

## 2017-09-23 MED ORDER — ALBUTEROL SULFATE (2.5 MG/3ML) 0.083% IN NEBU: 3.0000 mL | INHALATION_SOLUTION | Freq: Four times a day (QID) | RESPIRATORY_TRACT | Status: DC | PRN

## 2017-09-23 MED ORDER — INSULIN ASPART 100 UNIT/ML ~~LOC~~ SOLN
0.0000 [IU] | Freq: Three times a day (TID) | SUBCUTANEOUS | Status: DC
  Administered 2017-09-24: 1 [IU] via SUBCUTANEOUS
  Administered 2017-09-24: 2 [IU] via SUBCUTANEOUS
  Administered 2017-09-24: 1 [IU] via SUBCUTANEOUS
  Administered 2017-09-25: 7 [IU] via SUBCUTANEOUS

## 2017-09-23 MED ORDER — OXYCODONE HCL 5 MG PO TABS
5.0000 mg | ORAL_TABLET | ORAL | Status: DC | PRN
  Administered 2017-09-24 (×2): 5 mg via ORAL
  Filled 2017-09-23 (×2): qty 1

## 2017-09-23 MED ORDER — METRONIDAZOLE IN NACL 5-0.79 MG/ML-% IV SOLN
500.0000 mg | Freq: Three times a day (TID) | INTRAVENOUS | Status: DC
  Administered 2017-09-23 – 2017-09-25 (×5): 500 mg via INTRAVENOUS
  Filled 2017-09-23 (×5): qty 100

## 2017-09-23 MED ORDER — ONDANSETRON HCL 4 MG/2ML IJ SOLN
4.0000 mg | Freq: Four times a day (QID) | INTRAMUSCULAR | Status: DC | PRN
  Administered 2017-09-24 – 2017-09-25 (×2): 4 mg via INTRAVENOUS
  Filled 2017-09-23 (×2): qty 2

## 2017-09-23 MED ORDER — LISINOPRIL 5 MG PO TABS
2.5000 mg | ORAL_TABLET | Freq: Every day | ORAL | Status: DC
  Administered 2017-09-23 – 2017-09-25 (×3): 2.5 mg via ORAL
  Filled 2017-09-23 (×4): qty 1

## 2017-09-23 MED ORDER — IOPAMIDOL (ISOVUE-300) INJECTION 61%
100.0000 mL | Freq: Once | INTRAVENOUS | Status: AC | PRN
  Administered 2017-09-23: 100 mL via INTRAVENOUS

## 2017-09-23 MED ORDER — METOPROLOL TARTRATE 25 MG PO TABS: 25.0000 mg | ORAL_TABLET | Freq: Every day | ORAL | Status: DC | PRN

## 2017-09-23 MED ORDER — HYDROCODONE-ACETAMINOPHEN 5-325 MG PO TABS
1.0000 | ORAL_TABLET | Freq: Four times a day (QID) | ORAL | Status: DC | PRN
  Administered 2017-09-23 – 2017-09-25 (×6): 2 via ORAL
  Filled 2017-09-23: qty 2
  Filled 2017-09-23: qty 1
  Filled 2017-09-23: qty 2
  Filled 2017-09-23: qty 1
  Filled 2017-09-23 (×4): qty 2

## 2017-09-23 MED ORDER — SODIUM CHLORIDE 0.9 % IV SOLN: 250.0000 mL | INTRAVENOUS | Status: DC | PRN

## 2017-09-23 MED ORDER — CIPROFLOXACIN IN D5W 400 MG/200ML IV SOLN
400.0000 mg | Freq: Two times a day (BID) | INTRAVENOUS | Status: DC
  Administered 2017-09-23 – 2017-09-25 (×4): 400 mg via INTRAVENOUS
  Filled 2017-09-23 (×4): qty 200

## 2017-09-23 MED ORDER — SODIUM CHLORIDE 0.9% FLUSH: 3.0000 mL | INTRAVENOUS | Status: DC | PRN

## 2017-09-23 MED ORDER — ONDANSETRON HCL 4 MG PO TABS
4.0000 mg | ORAL_TABLET | Freq: Four times a day (QID) | ORAL | Status: DC | PRN
Start: 2017-09-23 — End: 2017-09-25

## 2017-09-23 MED ORDER — IOPAMIDOL (ISOVUE-300) INJECTION 61%
30.0000 mL | Freq: Once | INTRAVENOUS | Status: AC | PRN
  Administered 2017-09-23: 30 mL via ORAL

## 2017-09-23 MED ORDER — FLECAINIDE ACETATE 50 MG PO TABS
100.0000 mg | ORAL_TABLET | Freq: Two times a day (BID) | ORAL | Status: DC
  Administered 2017-09-23 – 2017-09-25 (×4): 100 mg via ORAL
  Filled 2017-09-23 (×4): qty 2

## 2017-09-23 MED ORDER — SODIUM CHLORIDE 0.9% FLUSH
3.0000 mL | Freq: Two times a day (BID) | INTRAVENOUS | Status: DC
  Administered 2017-09-23 – 2017-09-25 (×4): 3 mL via INTRAVENOUS

## 2017-09-23 NOTE — H&P (Signed)
History and Physical    Barbara [name] XTG:[id number] DOB: 04/28/1941 DOA: 09/23/2017  PCP: Asencion Noble, MD  Patient coming from: Home  Chief Complaint: Abdominal pain  HPI: Barbara [name] is a 77 y.o. female with medical history significant of diabetes, hypertension was seen in her PCP office today Dr. Willey Blade for follow-up for emergency room visit for abdominal pain last week on Sunday.  Patient has CT at that  time done of her abdomen pelvis which was negative.  Her white count however however at that time was 19.  She subsequently had a follow-up visit with Dr. Arnoldo Morale a couple of days ago who empirically started her on Cipro and Flagyl.  Patient still is not feeling well.  Patient did have a follow-up appointment with her primary care physician today who felt patient need to be admitted for repeat imaging.  Patient still having left lower quadrant abdominal pain.  She is nauseous but not vomiting.  She denies any fevers.  She denies any diarrhea.   Review of Systems: As per HPI otherwise 10 point review of systems negative.   Past Medical History:  Diagnosis Date  . Arteriosclerotic cardiovascular disease (ASCVD)    non-obstructive; negative stress nuclear and normal echo in 08/2009 [facility] in 03/2000-20%  main 20% LAD, 30% circumflex, 30% RCA, hyperdynamic LV  . Asthma    Mild  . Chest pain   . Chronic back pain   . Diabetes mellitus    A1c of 7.6 in 04/2010; moderate to high dose insulin  . GERD (gastroesophageal reflux disease)   . Hyperlipidemia    total cholesterol of 271 and LDL of 187 in 07/2010  . Hypertension   . Macular degeneration   . Palpitations   . Stroke [facility]) 1993 2008   short term memory loss  . Tobacco abuse     Past Surgical History:  Procedure Laterality Date  . APPENDECTOMY    . CATARACT EXTRACTION     left  . COLONOSCOPY  2009  . COLONOSCOPY  03/30/2012   Procedure: COLONOSCOPY;  Surgeon: Rogene Houston, MD;  [facility];  Service: Endoscopy;  Laterality: N/A;  730  . COLONOSCOPY N/A 12/13/2013   Procedure: COLONOSCOPY;  Surgeon: Rogene Houston, MD;  [facility];  Service: Endoscopy;  Laterality: N/A;  1200  . ESOPHAGOGASTRODUODENOSCOPY  10/21/2011   Procedure: ESOPHAGOGASTRODUODENOSCOPY (EGD);  Surgeon: Rogene Houston, MD;  [facility];  Service: Endoscopy;  Laterality: N/A;  1200  . ESOPHAGOGASTRODUODENOSCOPY N/A 12/13/2013   Procedure: ESOPHAGOGASTRODUODENOSCOPY (EGD);  Surgeon: Rogene Houston, MD;  [facility];  Service: Endoscopy;  Laterality: N/A;  . TUBAL LIGATION       reports that she has been smoking cigarettes.  She has a 3.75 pack-year smoking history. She has never used smokeless tobacco. She reports that she does not drink alcohol or use drugs.  Allergies bee venom  Family History  Problem Relation Age of Onset  . Heart attack Mother   . Cancer Father   . Cancer Sister   . Heart attack Brother   . Cancer Brother   . Cancer Sister   . Cancer Sister     Prior to Admission medications   Medication Sig Start Date End Date Taking? Authorizing Provider  aspirin EC 325 MG tablet Take 325 mg by mouth every evening.   Yes [provider]  Bevacizumab (AVASTIN) 100 MG/4ML SOLN Inject 1.25 mg into  the vein every 8 (eight) weeks.   Yes [provider]  brimonidine (ALPHAGAN) 0.2 % ophthalmic solution Place 1 drop into both eyes 3 (three) times daily.  01/31/15  Yes [provider]  Calcium Carbonate Antacid (TUMS E-X SUGAR FREE PO) Take 2 tablets by mouth every morning.   Yes [provider]  Cholecalciferol (VITAMIN D) 1000 UNITS capsule Take 1,000 Units by mouth every morning.    Yes [provider]  ciprofloxacin (CIPRO) 500 MG tablet Take 1 tablet (500 mg total) by mouth 2 (two) times daily. 09/20/17  Yes Aviva Signs, MD  flecainide (TAMBOCOR) 100 MG tablet TAKE 1 TABLET BY MOUTH TWICE DAILY 08/19/17  Yes  Deboraha Sprang, MD  HYDROcodone-acetaminophen (NORCO/VICODIN) 5-325 MG tablet Take 1-2 tablets by mouth every 6 (six) hours as needed. 09/18/17  Yes Fredia Sorrow, MD  insulin glargine (LANTUS) 100 UNIT/ML injection Inject 20 Units into the skin every morning.  08/16/13  Yes Asencion Noble, MD  insulin regular (NOVOLIN R,HUMULIN R) 100 units/mL injection Inject 2-10 Units into the skin 3 (three) times daily as needed for high blood sugar (per sliding scale). SLIDING SCALE    Yes [provider]  lisinopril (PRINIVIL,ZESTRIL) 5 MG tablet Take 2.5 mg by mouth every evening.  09/15/17  Yes [provider]  metoprolol tartrate (LOPRESSOR) 25 MG tablet Take 25 mg by mouth daily as needed (high heart rate).  08/18/15  Yes [provider]  metroNIDAZOLE (FLAGYL) 250 MG tablet Take 1 tablet (250 mg total) by mouth 3 (three) times daily. 09/20/17  Yes Aviva Signs, MD  Multiple Vitamins-Minerals (PRESERVISION AREDS 2) CAPS Take 1 capsule by mouth 2 (two) times daily.   Yes [provider]  ondansetron (ZOFRAN ODT) 4 MG disintegrating tablet Take 1 tablet (4 mg total) by mouth every 8 (eight) hours as needed. 09/18/17  Yes Fredia Sorrow, MD  Polyethyl Glycol-Propyl Glycol (SYSTANE OP) Apply 1 drop to eye 2 (two) times daily.   Yes [provider]  vitamin C (ASCORBIC ACID) 500 MG tablet Take 1,000 mg by mouth at bedtime.    Yes [provider]  vitamin E 1000 UNIT capsule Take 1,000 Units by mouth at bedtime.    Yes [provider]  nitroGLYCERIN (NITROSTAT) 0.4 MG SL tablet Place 0.4 mg under the tongue every 5 (five) minutes x 3 doses as needed for chest pain.  06/02/11   Yehuda Savannah, MD  PROAIR HFA 108 [phone] Base) MCG/ACT inhaler Inhale 1 puff into the lungs every 6 (six) hours as needed for wheezing or shortness of breath.  08/12/15   [provider]    Physical Exam: Vitals:   09/23/17 1707 09/23/17 1745  BP: (!) 153/70 (!) 153/70    Pulse: 76 76  Resp: 18 18  Temp: 98.3 F (36.8 C) 98.3 F (36.8 C)  TempSrc:  Oral  SpO2: 97%   Weight: 62.4 kg (137 lb 9.1 oz) 62.4 kg (137 lb 9.1 oz)  Height: 5\' 3"  (1.6 m) 5\' 3"  (1.6 m)    Constitutional: NAD, calm, comfortable Vitals:   09/23/17 1707 09/23/17 1745  BP: (!) 153/70 (!) 153/70  Pulse: 76 76  Resp: 18 18  Temp: 98.3 F (36.8 C) 98.3 F (36.8 C)  TempSrc:  Oral  SpO2: 97%   Weight: 62.4 kg (137 lb 9.1 oz) 62.4 kg (137 lb 9.1 oz)  Height: 5\' 3"  (1.6 m) 5\' 3"  (1.6 m)   Eyes: PERRL, lids and  conjunctivae normal ENMT: Mucous membranes are moist. Posterior pharynx clear of any exudate or lesions.Normal dentition.  Neck: normal, supple, no masses, no thyromegaly Respiratory: clear to auscultation bilaterally, no wheezing, no crackles. Normal respiratory effort. No accessory muscle use.  Cardiovascular: Regular rate and rhythm, no murmurs / rubs / gallops. No extremity edema. 2+ pedal pulses. No carotid bruits.  Abdomen: Left lower quadrant  tenderness, no masses palpated. No hepatosplenomegaly. Bowel sounds positive.  Musculoskeletal: no clubbing / cyanosis. No joint deformity upper and lower extremities. Good ROM, no contractures. Normal muscle tone.  Skin: no rashes, lesions, ulcers. No induration Neurologic: CN 2-12 grossly intact. Sensation intact, DTR normal. Strength 5/5 in all 4.  Psychiatric: Normal judgment and insight. Alert and oriented x 3. Normal mood.    Labs on Admission: I have personally reviewed following labs and imaging studies  CBC: Recent Labs  Lab 09/18/17 1838  WBC 19.5*  NEUTROABS 16.5*  HGB 12.9  HCT 40.5  MCV 91.8  PLT 742   Basic Metabolic Panel: Recent Labs  Lab 09/18/17 1826  NA 137  K 3.8  CL 102  CO2 23  GLUCOSE 117*  BUN 19  CREATININE 0.92  CALCIUM 9.1   GFR: Estimated Creatinine Clearance: 44.4 mL/min (by C-G formula based on SCr of 0.92 mg/dL). Liver Function Tests: Recent Labs  Lab 09/18/17 1826  AST  26  ALT 16  ALKPHOS 70  BILITOT 0.6  PROT 6.6  ALBUMIN 3.9   Recent Labs  Lab 09/18/17 1838  LIPASE 39   No results for input(s): AMMONIA in the last 168 hours. Coagulation Profile: No results for input(s): INR, PROTIME in the last 168 hours. Cardiac Enzymes: No results for input(s): CKTOTAL, CKMB, CKMBINDEX, TROPONINI in the last 168 hours. BNP (last 3 results) No results for input(s): PROBNP in the last 8760 hours. HbA1C: No results for input(s): HGBA1C in the last 72 hours. CBG: No results for input(s): GLUCAP in the last 168 hours. Lipid Profile: No results for input(s): CHOL, HDL, LDLCALC, TRIG, CHOLHDL, LDLDIRECT in the last 72 hours. Thyroid Function Tests: No results for input(s): TSH, T4TOTAL, FREET4, T3FREE, THYROIDAB in the last 72 hours. Anemia Panel: No results for input(s): VITAMINB12, FOLATE, FERRITIN, TIBC, IRON, RETICCTPCT in the last 72 hours. Urine analysis:    Component Value Date/Time   COLORURINE YELLOW 09/18/2017 1810   APPEARANCEUR CLEAR 09/18/2017 1810   LABSPEC 1.018 09/18/2017 1810   PHURINE 5.0 09/18/2017 1810   GLUCOSEU NEGATIVE 09/18/2017 1810   HGBUR NEGATIVE 09/18/2017 1810   BILIRUBINUR NEGATIVE 09/18/2017 1810   KETONESUR NEGATIVE 09/18/2017 1810   PROTEINUR NEGATIVE 09/18/2017 1810   UROBILINOGEN 0.2 07/06/2009 1116   NITRITE NEGATIVE 09/18/2017 1810   LEUKOCYTESUR NEGATIVE 09/18/2017 1810   Sepsis Labs: !!!!!!!!!!!!!!!!!!!!!!!!!!!!!!!!!!!!!!!!!!!! @LABRCNTIP (procalcitonin:4,lacticidven:4) )No results found for this or any previous visit (from the past 240 hour(s)).   Radiological Exams on Admission: No results found.  Old chart reviewed  Case discussed with PCP Dr. Willey Blade   Assessment/Plan 77 year old female with persistent left lower quadrant abdominal pain for the last week Principal Problem:   Abdominal pain, acute, left lower quadrant-repeat CT abdomen and pelvis today.  Patient has a benign abdominal exam.  Continue  Cipro and Flagyl for empiric coverage for acute diverticulitis.  Consult Dr. Arnoldo Morale general surgery.  Observe a medical bed.  Active Problems:   Hypertension-continue home meds   Hyperlipidemia-continue statin   Diabetes mellitus (HCC)-sliding scale insulin   Asthma-stable   Unspecified atrial fibrillation (HCC)-stable  DVT prophylaxis: SCDs Code Status: Full Family Communication: None Disposition Plan: Per day team Consults called: General surgery Admission status: Observation   Barbara [name] A MD [facility]  If 7PM-7AM, please contact night-coverage [email or web address] Password [email or web address]  09/23/2017, 7:08 PM

## 2017-09-24 DIAGNOSIS — Z91030 Bee allergy status: Secondary | ICD-10-CM | POA: Diagnosis not present

## 2017-09-24 DIAGNOSIS — K5792 Diverticulitis of intestine, part unspecified, without perforation or abscess without bleeding: Secondary | ICD-10-CM

## 2017-09-24 DIAGNOSIS — I4891 Unspecified atrial fibrillation: Secondary | ICD-10-CM | POA: Diagnosis present

## 2017-09-24 DIAGNOSIS — Z7982 Long term (current) use of aspirin: Secondary | ICD-10-CM | POA: Diagnosis not present

## 2017-09-24 DIAGNOSIS — Z79899 Other long term (current) drug therapy: Secondary | ICD-10-CM | POA: Diagnosis not present

## 2017-09-24 DIAGNOSIS — I2510 Atherosclerotic heart disease of native coronary artery without angina pectoris: Secondary | ICD-10-CM | POA: Diagnosis present

## 2017-09-24 DIAGNOSIS — Z882 Allergy status to sulfonamides status: Secondary | ICD-10-CM | POA: Diagnosis not present

## 2017-09-24 DIAGNOSIS — E119 Type 2 diabetes mellitus without complications: Secondary | ICD-10-CM | POA: Diagnosis present

## 2017-09-24 DIAGNOSIS — F17210 Nicotine dependence, cigarettes, uncomplicated: Secondary | ICD-10-CM | POA: Diagnosis present

## 2017-09-24 DIAGNOSIS — Z880 Allergy status to penicillin: Secondary | ICD-10-CM | POA: Diagnosis not present

## 2017-09-24 DIAGNOSIS — K5720 Diverticulitis of large intestine with perforation and abscess without bleeding: Secondary | ICD-10-CM | POA: Diagnosis present

## 2017-09-24 DIAGNOSIS — K219 Gastro-esophageal reflux disease without esophagitis: Secondary | ICD-10-CM | POA: Diagnosis present

## 2017-09-24 DIAGNOSIS — Z794 Long term (current) use of insulin: Secondary | ICD-10-CM | POA: Diagnosis not present

## 2017-09-24 DIAGNOSIS — K5740 Diverticulitis of both small and large intestine with perforation and abscess without bleeding: Secondary | ICD-10-CM | POA: Diagnosis present

## 2017-09-24 DIAGNOSIS — I10 Essential (primary) hypertension: Secondary | ICD-10-CM | POA: Diagnosis present

## 2017-09-24 DIAGNOSIS — Z8673 Personal history of transient ischemic attack (TIA), and cerebral infarction without residual deficits: Secondary | ICD-10-CM | POA: Diagnosis not present

## 2017-09-24 DIAGNOSIS — R1032 Left lower quadrant pain: Secondary | ICD-10-CM | POA: Diagnosis not present

## 2017-09-24 DIAGNOSIS — H3530 Unspecified macular degeneration: Secondary | ICD-10-CM | POA: Diagnosis present

## 2017-09-24 DIAGNOSIS — E785 Hyperlipidemia, unspecified: Secondary | ICD-10-CM | POA: Diagnosis present

## 2017-09-24 DIAGNOSIS — E781 Pure hyperglyceridemia: Secondary | ICD-10-CM

## 2017-09-24 LAB — GLUCOSE, CAPILLARY
GLUCOSE-CAPILLARY: 152 mg/dL — AB (ref 65–99)
GLUCOSE-CAPILLARY: 172 mg/dL — AB (ref 65–99)
Glucose-Capillary: 132 mg/dL — ABNORMAL HIGH (ref 65–99)
Glucose-Capillary: 148 mg/dL — ABNORMAL HIGH (ref 65–99)

## 2017-09-24 LAB — BASIC METABOLIC PANEL
Anion gap: 11 (ref 5–15)
BUN: 7 mg/dL (ref 6–20)
CALCIUM: 8.6 mg/dL — AB (ref 8.9–10.3)
CO2: 24 mmol/L (ref 22–32)
CREATININE: 0.9 mg/dL (ref 0.44–1.00)
Chloride: 99 mmol/L — ABNORMAL LOW (ref 101–111)
GFR calc Af Amer: >60 mL/min (ref >60)
GLUCOSE: 105 mg/dL — AB (ref 65–99)
POTASSIUM: 4 mmol/L (ref 3.5–5.1)
Sodium: 134 mmol/L — ABNORMAL LOW (ref 135–145)

## 2017-09-24 LAB — CBC
HEMATOCRIT: 36 % (ref 36.0–46.0)
Hemoglobin: 11.8 g/dL — ABNORMAL LOW (ref 12.0–15.0)
MCH: 30.3 pg (ref 26.0–34.0)
MCHC: 32.8 g/dL (ref 30.0–36.0)
MCV: 92.5 fL (ref 78.0–100.0)
PLATELETS: 286 10*3/uL (ref 150–400)
RBC: 3.89 MIL/uL (ref 3.87–5.11)
RDW: 13.1 % (ref 11.5–15.5)
WBC: 9.1 10*3/uL (ref 4.0–10.5)

## 2017-09-24 MED ORDER — MORPHINE SULFATE (PF) 2 MG/ML IV SOLN
2.0000 mg | INTRAVENOUS | Status: DC | PRN
  Administered 2017-09-24 – 2017-09-25 (×5): 2 mg via INTRAVENOUS
  Filled 2017-09-24 (×5): qty 1

## 2017-09-24 NOTE — Consult Note (Signed)
Reason for Consult: Left lower quadrant abdominal pain, history of diverticulosis Referring Physician: Dr. Aviva Signs Barbara [name] is an 77 y.o. female.  HPI: Patient is a 77 year old white female who I recently saw in my office for left lower quadrant abdominal pain.  She had been previously seen in the emergency room and was diagnosed with diverticulosis, but there was no evidence of diverticulitis.  She did have a leukocytosis of unknown etiology.  She was empirically started on Cipro and Flagyl.  Over the past few days, she has had worsening left lower quadrant abdominal pain.  She was directly admitted to the hospital for further evaluation and treatment by Dr. Willey Blade.  Repeat CT scan of the abdomen reveals sigmoid diverticulitis with a small abscess present.  It is not drainable by interventional radiology.  She currently has a pain level of 7 out of 10.  She is teary-eyed.  Past Medical History:  Diagnosis Date  . Arteriosclerotic cardiovascular disease (ASCVD)    non-obstructive; negative stress nuclear and normal echo in 08/2009 [facility] in 03/2000-20%  main 20% LAD, 30% circumflex, 30% RCA, hyperdynamic LV  . Asthma    Mild  . Chest pain   . Chronic back pain   . Diabetes mellitus    A1c of 7.6 in 04/2010; moderate to high dose insulin  . GERD (gastroesophageal reflux disease)   . Hyperlipidemia    total cholesterol of 271 and LDL of 187 in 07/2010  . Hypertension   . Macular degeneration   . Palpitations   . Stroke [facility]) 1993 2008   short term memory loss  . Tobacco abuse     Past Surgical History:  Procedure Laterality Date  . APPENDECTOMY    . CATARACT EXTRACTION     left  . COLONOSCOPY  2009  . COLONOSCOPY  03/30/2012   Procedure: COLONOSCOPY;  Surgeon: Rogene Houston, MD;  [facility];  Service: Endoscopy;  Laterality: N/A;  730  . COLONOSCOPY N/A 12/13/2013   Procedure: COLONOSCOPY;  Surgeon: Rogene Houston, MD;  [facility];  Service: Endoscopy;  Laterality: N/A;  1200  . ESOPHAGOGASTRODUODENOSCOPY  10/21/2011   Procedure: ESOPHAGOGASTRODUODENOSCOPY (EGD);  Surgeon: Rogene Houston, MD;  [facility];  Service: Endoscopy;  Laterality: N/A;  1200  . ESOPHAGOGASTRODUODENOSCOPY N/A 12/13/2013   Procedure: ESOPHAGOGASTRODUODENOSCOPY (EGD);  Surgeon: Rogene Houston, MD;  [facility];  Service: Endoscopy;  Laterality: N/A;  . TUBAL LIGATION      Family History  Problem Relation Age of Onset  . Heart attack Mother   . Cancer Father   . Cancer Sister   . Heart attack Brother   . Cancer Brother   . Cancer Sister   . Cancer Sister     Social History:  reports that she has been smoking cigarettes.  She has a 3.75 pack-year smoking history. She has never used smokeless tobacco. She reports that she does not drink alcohol or use drugs.  Allergies: Penicillin, sulfamethoxazole, B venom, sulfonamide   Medications: I have reviewed the patient's current medications.  Results for orders placed or performed during the hospital encounter of 09/23/17 (from the past 48 hour(s))  CBC WITH DIFFERENTIAL     Status: Abnormal   Collection Time: 09/23/17  6:52 PM  Result Value Ref Range   WBC 8.7 4.0 - 10.5 K/uL   RBC 3.77 (L) 3.87 - 5.11 MIL/uL   Hemoglobin 11.4 (L) 12.0 -  15.0 g/dL   HCT 34.6 (L) 36.0 - 46.0 %   MCV 91.8 78.0 - 100.0 fL   MCH 30.2 26.0 - 34.0 pg   MCHC 32.9 30.0 - 36.0 g/dL   RDW 13.0 11.5 - 15.5 %   Platelets 290 150 - 400 K/uL   Neutrophils Relative % 62 %   Neutro Abs 5.5 1.7 - 7.7 K/uL   Lymphocytes Relative 20 %   Lymphs Abs 1.7 0.7 - 4.0 K/uL   Monocytes Relative 12 %   Monocytes Absolute 1.1 (H) 0.1 - 1.0 K/uL   Eosinophils Relative 5 %   Eosinophils Absolute 0.4 0.0 - 0.7 K/uL   Basophils Relative 1 %   Basophils Absolute 0.1 0.0 - 0.1 K/uL    Comment: Performed at [facility], [address], [place], [place] [address]  Comprehensive metabolic panel      Status: Abnormal   Collection Time: 09/23/17  6:52 PM  Result Value Ref Range   Sodium 136 135 - 145 mmol/L   Potassium 3.9 3.5 - 5.1 mmol/L   Chloride 100 (L) 101 - 111 mmol/L   CO2 25 22 - 32 mmol/L   Glucose, Bld 50 (L) 65 - 99 mg/dL   BUN 9 6 - 20 mg/dL   Creatinine, Ser 0.79 0.44 - 1.00 mg/dL   Calcium 8.9 8.9 - 10.3 mg/dL   Total Protein 6.7 6.5 - 8.1 g/dL   Albumin 3.4 (L) 3.5 - 5.0 g/dL   AST 24 15 - 41 U/L   ALT 19 14 - 54 U/L   Alkaline Phosphatase 123 38 - 126 U/L   Total Bilirubin 0.5 0.3 - 1.2 mg/dL   GFR calc non Af Amer >60 >60 mL/min   GFR calc Af Amer >60 >60 mL/min    Comment: (NOTE) The eGFR has been calculated using the CKD EPI equation. This calculation has not been validated in all clinical situations. eGFR's persistently <60 mL/min signify possible Chronic Kidney Disease.    Anion gap 11 5 - 15    Comment: Performed at [facility], [address], [place], [place] [address]  Glucose, capillary     Status: None   Collection Time: 09/23/17 10:18 PM  Result Value Ref Range   Glucose-Capillary 96 65 - 99 mg/dL  Urinalysis, Complete w Microscopic     Status: Abnormal   Collection Time: 09/23/17 10:40 PM  Result Value Ref Range   Color, Urine STRAW (A) YELLOW   APPearance CLEAR CLEAR   Specific Gravity, Urine 1.026 1.005 - 1.030   pH 6.0 5.0 - 8.0   Glucose, UA NEGATIVE NEGATIVE mg/dL   Hgb urine dipstick SMALL (A) NEGATIVE   Bilirubin Urine NEGATIVE NEGATIVE   Ketones, ur NEGATIVE NEGATIVE mg/dL   Protein, ur NEGATIVE NEGATIVE mg/dL   Nitrite NEGATIVE NEGATIVE   Leukocytes, UA NEGATIVE NEGATIVE   RBC / HPF 0-5 0 - 5 RBC/hpf   Bacteria, UA NONE SEEN NONE SEEN   Squamous Epithelial / LPF 0-5 0 - 5    Comment: Please note change in reference range. Performed at [facility], [address] [place], [place] [address]   CBC     Status: Abnormal   Collection Time: 09/24/17  6:32 AM  Result Value Ref Range   WBC 9.1 4.0 - 10.5 K/uL   RBC 3.89 3.87 -  5.11 MIL/uL   Hemoglobin 11.8 (L) 12.0 - 15.0 g/dL   HCT 36.0 36.0 - 46.0 %   MCV 92.5 78.0 -  100.0 fL   MCH 30.3 26.0 - 34.0 pg   MCHC 32.8 30.0 - 36.0 g/dL   RDW 13.1 11.5 - 15.5 %   Platelets 286 150 - 400 K/uL    Comment: Performed at [facility], [address] [place], [place] [address]  Glucose, capillary     Status: Abnormal   Collection Time: 09/24/17  7:34 AM  Result Value Ref Range   Glucose-Capillary 132 (H) 65 - 99 mg/dL   Comment 1 Notify RN    Comment 2 Document in Chart     Ct Abdomen Pelvis W Contrast  Result Date: 09/23/2017 CLINICAL DATA:  Abdominal pain, diverticulitis suspected EXAM: CT ABDOMEN AND PELVIS WITH CONTRAST TECHNIQUE: Multidetector CT imaging of the abdomen and pelvis was performed using the standard protocol following bolus administration of intravenous contrast. CONTRAST:  158m ISOVUE-300 IOPAMIDOL (ISOVUE-300) INJECTION 61% COMPARISON:  CT abdomen/pelvis dated 09/10/2017. FINDINGS: Lower chest: Lung bases are clear. Hepatobiliary: Liver is within normal limits. Gallbladder is unremarkable. No intrahepatic or extrahepatic ductal dilatation. Pancreas: Within normal limits. Spleen: Within normal limits. Adrenals/Urinary Tract: Adrenal glands are within normal limits. Kidneys within normal limits.  No hydronephrosis. Mildly thick-walled bladder. Stomach/Bowel: Stomach is within normal limits. No evidence of bowel obstruction. Prior appendectomy. Sigmoid diverticulosis, with mild pericolonic inflammatory changes in the left lower quadrant, reflecting acute diverticulitis. Associated 18 x 12 mm fluid/gas collection inferior to the sigmoid colon (series 2/image 60), suggesting a small pericolonic abscess, although this does not reflect a drainable fluid collection. No free air. Vascular/Lymphatic: No evidence of abdominal aortic aneurysm. Atherosclerotic calcifications of the abdominal aorta and branch vessels. No suspicious abdominopelvic lymphadenopathy.  Reproductive: Uterus is within normal limits. Bilateral ovaries are within normal limits. Other: No abdominopelvic ascites. Musculoskeletal: Mild degenerative changes at L5-S1. IMPRESSION: Acute diverticulitis. 1.8 cm fluid/gas collection adjacent to the sigmoid colon, suggesting a small pericolonic abscess, although this does not reflect a drainable collection. No free air. Electronically Signed   By: SJulian HyM.D.   On: 09/23/2017 20:51    ROS:  Pertinent items are noted in HPI.  Blood pressure (!) 158/55, pulse 67, temperature 98.1 F (36.7 C), temperature source Oral, resp. rate 16, height '5\' 3"'$  (1.6 m), weight 138 lb 4.8 oz (62.7 kg), SpO2 100 %. Physical Exam: Anxious and teary-eyed white female Head is normocephalic, atraumatic Lungs clear to auscultation with equal breath sounds bilaterally Heart examination reveals regular rate and rhythm without S3, S4, murmurs Abdomen is soft with tenderness normal left lower quadrant to palpation.  No rigidity is noted.  Bowel sounds are present. CT scan images personally reviewed  Assessment/Plan: Impression: Progression of her sigmoid diverticulitis while on ciprofloxacin and Flagyl.  Her leukocytosis has resolved, which is a good sign.  At this point, she needs a full 2-week treatment of antibiotics for her diverticulitis.  Given the recent findings, she will need a partial colectomy in 4 to 6 weeks.  Pain control is an issue and I have started her on morphine IV.  Will follow with you.  MAviva Signs5/08/2017, 9:30 AM

## 2017-09-24 NOTE — Progress Notes (Signed)
PROGRESS NOTE    Barbara [name]  GYJ:[id number] DOB: 1941/01/21 DOA: 09/23/2017 PCP: Asencion Noble, MD     Brief Narrative:  77 year old woman admitted on 5/3 from her PCPs office.  She had recently been diagnosed with diverticulitis and was empirically started on Cipro and Flagyl however over the past few days she had worsening of her left lower quadrant pain and on CT scan was noted to have a 1.8 cm abscess adjacent to the colon and admission was requested.   Assessment & Plan:   Principal Problem:   Abdominal pain, acute, left lower quadrant Active Problems:   Hypertension   Hyperlipidemia   Diabetes mellitus ([place])   Asthma   Unspecified atrial fibrillation ([place])   Acute diverticulitis with abscess -General surgery, Dr. Arnoldo Morale is on board. -Per surgery, this abscess is not drainable. -Plan to continue antibiotics, pain control with IV pain medications.  Hypertension -Well-controlled, continue home medications.  Hyperlipidemia -Continue statin  Diabetes mellitus -Fair control, continue current regimen   DVT prophylaxis: SCDs Code Status: Full code Family Communication: Patient only Disposition Plan: Hope for discharge home over next 2448 hrs. as long as abdominal pain improves  Consultants:   General surgery  Procedures:   None  Antimicrobials:  Anti-infectives (From admission, onward)   Start     Dose/Rate Route Frequency Ordered Stop   09/23/17 2000  metroNIDAZOLE (FLAGYL) IVPB 500 mg     500 mg 100 mL/hr over 60 Minutes Intravenous Every 8 hours 09/23/17 1749     09/23/17 1800  ciprofloxacin (CIPRO) IVPB 400 mg     400 mg 200 mL/hr over 60 Minutes Intravenous Every 12 hours 09/23/17 1749         Subjective: Is teary-eyed due to significant left lower quadrant pain, somewhat better after morphine given earlier today.  Objective: Vitals:   09/23/17 1745 09/23/17 2217 09/24/17 0505 09/24/17 1356  BP: (!) 153/70 (!) 137/49 (!) 158/55 110/89    Pulse: 76 68 67 70  Resp: 18 16 16 20   Temp: 98.3 F (36.8 C)  98.1 F (36.7 C) 98.6 F (37 C)  TempSrc: Oral  Oral Oral  SpO2:  99% 100% 97%  Weight: 62.4 kg (137 lb 9.1 oz)  62.7 kg (138 lb 4.8 oz)   Height: 5\' 3"  (1.6 m)       Intake/Output Summary (Last 24 hours) at 09/24/2017 1601 Last data filed at 09/24/2017 1506 Gross per 24 hour  Intake 700 ml  Output 1100 ml  Net -400 ml   Filed Weights   09/23/17 1707 09/23/17 1745 09/24/17 0505  Weight: 62.4 kg (137 lb 9.1 oz) 62.4 kg (137 lb 9.1 oz) 62.7 kg (138 lb 4.8 oz)    Examination:  General exam: Alert, awake, oriented x 3 Respiratory system: Clear to auscultation. Respiratory effort normal. Cardiovascular system:RRR. No murmurs, rubs, gallops. Gastrointestinal system: Abdomen is nondistended, tender to palpation to the left lower quadrant and suprapubic area, normal bowel sounds, no masses noted.   Central nervous system: Alert and oriented. No focal neurological deficits. Extremities: No C/C/E, +pedal pulses Skin: No rashes, lesions or ulcers Psychiatry: Judgement and insight appear normal. Mood & affect appropriate.     Data Reviewed: I have personally reviewed following labs and imaging studies  CBC: Recent Labs  Lab 09/18/17 1838 09/23/17 1852 09/24/17 0632  WBC 19.5* 8.7 9.1  NEUTROABS 16.5* 5.5  --   HGB 12.9 11.4* 11.8*  HCT 40.5 34.6* 36.0  MCV 91.8 91.8 92.5  PLT 252 290 099   Basic Metabolic Panel: Recent Labs  Lab 09/18/17 1826 09/23/17 1852 09/24/17 0632  NA 137 136 134*  K 3.8 3.9 4.0  CL 102 100* 99*  CO2 23 25 24   GLUCOSE 117* 50* 105*  BUN 19 9 7   CREATININE 0.92 0.79 0.90  CALCIUM 9.1 8.9 8.6*   GFR: Estimated Creatinine Clearance: 45.4 mL/min (by C-G formula based on SCr of 0.9 mg/dL). Liver Function Tests: Recent Labs  Lab 09/18/17 1826 09/23/17 1852  AST 26 24  ALT 16 19  ALKPHOS 70 123  BILITOT 0.6 0.5  PROT 6.6 6.7  ALBUMIN 3.9 3.4*   Recent Labs  Lab  09/18/17 1838  LIPASE 39   No results for input(s): AMMONIA in the last 168 hours. Coagulation Profile: No results for input(s): INR, PROTIME in the last 168 hours. Cardiac Enzymes: No results for input(s): CKTOTAL, CKMB, CKMBINDEX, TROPONINI in the last 168 hours. BNP (last 3 results) No results for input(s): PROBNP in the last 8760 hours. HbA1C: No results for input(s): HGBA1C in the last 72 hours. CBG: Recent Labs  Lab 09/23/17 2218 09/24/17 0734 09/24/17 1130  GLUCAP 96 132* 148*   Lipid Profile: No results for input(s): CHOL, HDL, LDLCALC, TRIG, CHOLHDL, LDLDIRECT in the last 72 hours. Thyroid Function Tests: No results for input(s): TSH, T4TOTAL, FREET4, T3FREE, THYROIDAB in the last 72 hours. Anemia Panel: No results for input(s): VITAMINB12, FOLATE, FERRITIN, TIBC, IRON, RETICCTPCT in the last 72 hours. Urine analysis:    Component Value Date/Time   COLORURINE STRAW (A) 09/23/2017 2240   APPEARANCEUR CLEAR 09/23/2017 2240   LABSPEC 1.026 09/23/2017 2240   PHURINE 6.0 09/23/2017 2240   GLUCOSEU NEGATIVE 09/23/2017 2240   HGBUR SMALL (A) 09/23/2017 2240   BILIRUBINUR NEGATIVE 09/23/2017 2240   KETONESUR NEGATIVE 09/23/2017 2240   PROTEINUR NEGATIVE 09/23/2017 2240   UROBILINOGEN 0.2 07/06/2009 1116   NITRITE NEGATIVE 09/23/2017 2240   LEUKOCYTESUR NEGATIVE 09/23/2017 2240   Sepsis Labs: @LABRCNTIP (procalcitonin:4,lacticidven:4)  )No results found for this or any previous visit (from the past 240 hour(s)).       Radiology Studies: Ct Abdomen Pelvis W Contrast  Result Date: 09/23/2017 CLINICAL DATA:  Abdominal pain, diverticulitis suspected EXAM: CT ABDOMEN AND PELVIS WITH CONTRAST TECHNIQUE: Multidetector CT imaging of the abdomen and pelvis was performed using the standard protocol following bolus administration of intravenous contrast. CONTRAST:  119mL ISOVUE-300 IOPAMIDOL (ISOVUE-300) INJECTION 61% COMPARISON:  CT abdomen/pelvis dated 09/10/2017.  FINDINGS: Lower chest: Lung bases are clear. Hepatobiliary: Liver is within normal limits. Gallbladder is unremarkable. No intrahepatic or extrahepatic ductal dilatation. Pancreas: Within normal limits. Spleen: Within normal limits. Adrenals/Urinary Tract: Adrenal glands are within normal limits. Kidneys within normal limits.  No hydronephrosis. Mildly thick-walled bladder. Stomach/Bowel: Stomach is within normal limits. No evidence of bowel obstruction. Prior appendectomy. Sigmoid diverticulosis, with mild pericolonic inflammatory changes in the left lower quadrant, reflecting acute diverticulitis. Associated 18 x 12 mm fluid/gas collection inferior to the sigmoid colon (series 2/image 60), suggesting a small pericolonic abscess, although this does not reflect a drainable fluid collection. No free air. Vascular/Lymphatic: No evidence of abdominal aortic aneurysm. Atherosclerotic calcifications of the abdominal aorta and branch vessels. No suspicious abdominopelvic lymphadenopathy. Reproductive: Uterus is within normal limits. Bilateral ovaries are within normal limits. Other: No abdominopelvic ascites. Musculoskeletal: Mild degenerative changes at L5-S1. IMPRESSION: Acute diverticulitis. 1.8 cm fluid/gas collection adjacent to the sigmoid colon, suggesting a small pericolonic abscess, although this does not reflect a drainable  collection. No free air. Electronically Signed   By: Julian Hy M.D.   On: 09/23/2017 20:51        Scheduled Meds: . flecainide  100 mg Oral Q12H  . insulin aspart  0-9 Units Subcutaneous TID WC  . lisinopril  2.5 mg Oral Daily  . sodium chloride flush  3 mL Intravenous Q12H   Continuous Infusions: . sodium chloride    . ciprofloxacin Stopped (09/24/17 0700)  . metronidazole Stopped (09/24/17 1305)     LOS: 0 days    Time spent: 25 minutes.    Lelon Frohlich, MD [facility] Pager [phone]  If 7PM-7AM, please contact  night-coverage [email or web address] Password [email or web address] 09/24/2017, 4:01 PM

## 2017-09-25 LAB — GLUCOSE, CAPILLARY
GLUCOSE-CAPILLARY: 225 mg/dL — AB (ref 65–99)
Glucose-Capillary: 325 mg/dL — ABNORMAL HIGH (ref 65–99)

## 2017-09-25 MED ORDER — OXYCODONE-ACETAMINOPHEN 7.5-325 MG PO TABS: 1.0000 | ORAL_TABLET | Freq: Four times a day (QID) | ORAL | 0 refills | Status: DC | PRN

## 2017-09-25 MED ORDER — INSULIN GLARGINE 100 UNIT/ML ~~LOC~~ SOLN
15.0000 [IU] | Freq: Every day | SUBCUTANEOUS | Status: DC
  Administered 2017-09-25: 15 [IU] via SUBCUTANEOUS
  Filled 2017-09-25 (×2): qty 0.15

## 2017-09-25 NOTE — Discharge Instructions (Signed)
Diverticulitis °Diverticulitis is infection or inflammation of small pouches (diverticula) in the colon that form due to a condition called diverticulosis. Diverticula can trap stool (feces) and bacteria, causing infection and inflammation. °Diverticulitis may cause severe stomach pain and diarrhea. It may lead to tissue damage in the colon that causes bleeding. The diverticula may also burst (rupture) and cause infected stool to enter other areas of the abdomen. °Complications of diverticulitis can include: °· Bleeding. °· Severe infection. °· Severe pain. °· Rupture (perforation) of the colon. °· Blockage (obstruction) of the colon. ° °What are the causes? °This condition is caused by stool becoming trapped in the diverticula, which allows bacteria to grow in the diverticula. This leads to inflammation and infection. °What increases the risk? °You are more likely to develop this condition if: °· You have diverticulosis. The risk for diverticulosis increases if: °? You are overweight or obese. °? You use tobacco products. °? You do not get enough exercise. °· You eat a diet that does not include enough fiber. High-fiber foods include fruits, vegetables, beans, nuts, and whole grains. ° °What are the signs or symptoms? °Symptoms of this condition may include: °· Pain and tenderness in the abdomen. The pain is normally located on the left side of the abdomen, but it may occur in other areas. °· Fever and chills. °· Bloating. °· Cramping. °· Nausea. °· Vomiting. °· Changes in bowel routines. °· Blood in your stool. ° °How is this diagnosed? °This condition is diagnosed based on: °· Your medical history. °· A physical exam. °· Tests to make sure there is nothing else causing your condition. These tests may include: °? Blood tests. °? Urine tests. °? Imaging tests of the abdomen, including X-rays, ultrasounds, MRIs, or CT scans. ° °How is this treated? °Most cases of this condition are mild and can be treated at home.  Treatment may include: °· Taking over-the-counter pain medicines. °· Following a clear liquid diet. °· Taking antibiotic medicines by mouth. °· Rest. ° °More severe cases may need to be treated at a hospital. Treatment may include: °· Not eating or drinking. °· Taking prescription pain medicine. °· Receiving antibiotic medicines through an IV tube. °· Receiving fluids and nutrition through an IV tube. °· Surgery. ° °When your condition is under control, your health care provider may recommend that you have a colonoscopy. This is an exam to look at the entire large intestine. During the exam, a lubricated, bendable tube is inserted into the anus and then passed into the rectum, colon, and other parts of the large intestine. A colonoscopy can show how severe your diverticula are and whether something else may be causing your symptoms. °Follow these instructions at home: °Medicines °· Take over-the-counter and prescription medicines only as told by your health care provider. These include fiber supplements, probiotics, and stool softeners. °· If you were prescribed an antibiotic medicine, take it as told by your health care provider. Do not stop taking the antibiotic even if you start to feel better. °· Do not drive or use heavy machinery while taking prescription pain medicine. °General instructions °· Follow a full liquid diet or another diet as directed by your health care provider. After your symptoms improve, your health care provider may tell you to change your diet. He or she may recommend that you eat a diet that contains at least 25 g (25 grams) of fiber daily. Fiber makes it easier to pass stool. Healthy sources of fiber include: °? Berries. One cup   contains 4-8 grams of fiber. °? Beans or lentils. One half cup contains 5-8 grams of fiber. °? Green vegetables. One cup contains 4 grams of fiber. °· Exercise for at least 30 minutes, 3 times each week. You should exercise hard enough to raise your heart rate and  break a sweat. °· Keep all follow-up visits as told by your health care provider. This is important. You may need a colonoscopy. °Contact a health care provider if: °· Your pain does not improve. °· You have a hard time drinking or eating food. °· Your bowel movements do not return to normal. °Get help right away if: °· Your pain gets worse. °· Your symptoms do not get better with treatment. °· Your symptoms suddenly get worse. °· You have a fever. °· You vomit more than one time. °· You have stools that are bloody, black, or tarry. °Summary °· Diverticulitis is infection or inflammation of small pouches (diverticula) in the colon that form due to a condition called diverticulosis. Diverticula can trap stool (feces) and bacteria, causing infection and inflammation. °· You are at higher risk for this condition if you have diverticulosis and you eat a diet that does not include enough fiber. °· Most cases of this condition are mild and can be treated at home. More severe cases may need to be treated at a hospital. °· When your condition is under control, your health care provider may recommend that you have an exam called a colonoscopy. This exam can show how severe your diverticula are and whether something else may be causing your symptoms. °This information is not intended to replace advice given to you by your health care provider. Make sure you discuss any questions you have with your health care provider. °Document Released: 02/17/2005 Document Revised: 06/12/2016 Document Reviewed: 06/12/2016 °Elsevier Interactive Patient Education © 2018 [employer] ° °

## 2017-09-25 NOTE — Progress Notes (Signed)
Pt IV removed, WNL. D/c instructions given to pt. Verbalized understanding. Pt daughter at bedside to transport home.

## 2017-09-25 NOTE — Discharge Summary (Signed)
Physician Discharge Summary  Patient ID: Barbara [name] MRN: [id number] DOB/AGE: Feb 12, 1941 77 y.o.  Admit date: 09/23/2017 Discharge date: 09/25/2017  Admission Diagnoses: Left lower quadrant abdominal pain, sigmoid diverticulitis with abscess  Discharge Diagnoses: Same Principal Problem:   Abdominal pain, acute, left lower quadrant Active Problems:   Hypertension   Hyperlipidemia   Diabetes mellitus ([place])   Asthma   Unspecified atrial fibrillation ([facility])   Acute diverticulitis   Discharged Condition: good  Hospital Course: Patient is a 77 year old white female who presented to her primary care physician with worsening left lower quadrant abdominal pain.  She was being empirically treated for sigmoid diverticulitis.  She was directly admitted to the hospital for pain control.  Repeat CT scan of the abdomen revealed sigmoid diverticulitis with abscess formation.  This was different than her previous CT scan of the abdomen.  She was admitted to the hospital for IV antibiotics and pain control.  Her leukocytosis subsequently resolved on hospital day 1.  There was some difficulty in controlling her pain, but morphine ultimately help control it.  She is passing gas.  She is being discharged home on 09/25/2017 in good and improving condition.  Surgery was consulted and it is felt that the patient will need an elective partial colectomy in 4 to 6 weeks.  She understands this.  Discharge Exam: Blood pressure (!) 110/43, pulse 75, temperature 98.1 F (36.7 C), temperature source Oral, resp. rate 18, height 5\' 3"  (1.6 m), weight 138 lb 4.8 oz (62.7 kg), SpO2 (!) 89 %. General appearance: alert, cooperative and no distress Resp: clear to auscultation bilaterally Cardio: regular rate and rhythm, S1, S2 normal, no murmur, click, rub or gallop GI: Soft, tender in left lower quadrant but no rigidity noted.  Bowel sounds present.  Disposition: Discharge disposition: 01-Home or Self  Care       Discharge Instructions    Diet - low sodium heart healthy   Complete by:  As directed    Increase activity slowly   Complete by:  As directed      Allergies as of 09/25/2017      Reactions   Bee Venom Anaphylaxis   Sulfamethoxazole Nausea And Vomiting   Penicillins Itching, Rash      Sulfonamide Derivatives Nausea And Vomiting      Medication List    TAKE these medications   aspirin EC 325 MG tablet Take 325 mg by mouth every evening.   AVASTIN 100 MG/4ML Soln Generic drug:  Bevacizumab Inject 1.25 mg into the vein every 8 (eight) weeks.   brimonidine 0.2 % ophthalmic solution Commonly known as:  ALPHAGAN Place 1 drop into both eyes 3 (three) times daily.   flecainide 100 MG tablet Commonly known as:  TAMBOCOR TAKE 1 TABLET BY MOUTH TWICE DAILY   insulin glargine 100 UNIT/ML injection Commonly known as:  LANTUS Inject 20 Units into the skin every morning.   insulin regular 100 units/mL injection Commonly known as:  NOVOLIN R,HUMULIN R Inject 2-10 Units into the skin 3 (three) times daily as needed for high blood sugar (per sliding scale). SLIDING SCALE   lisinopril 5 MG tablet Commonly known as:  PRINIVIL,ZESTRIL Take 2.5 mg by mouth every evening.   metoprolol tartrate 25 MG tablet Commonly known as:  LOPRESSOR Take 25 mg by mouth daily as needed (high heart rate).   nitroGLYCERIN 0.4 MG SL tablet Commonly known as:  NITROSTAT Place 0.4 mg under the tongue every 5 (five) minutes x 3 doses  as needed for chest pain.   ondansetron 4 MG disintegrating tablet Commonly known as:  ZOFRAN ODT Take 1 tablet (4 mg total) by mouth every 8 (eight) hours as needed. What changed:  reasons to take this   oxyCODONE-acetaminophen 7.5-325 MG tablet Commonly known as:  PERCOCET Take 1 tablet by mouth every 6 (six) hours as needed.   PRESERVISION AREDS 2 Caps Take 1 capsule by mouth 2 (two) times daily.   SYSTANE OP Apply 1 drop to eye 2 (two) times  daily.   TUMS E-X SUGAR FREE PO Take 2 tablets by mouth every morning.   VITAMIN B COMPLEX PO Take 1 tablet by mouth daily.   vitamin C 500 MG tablet Commonly known as:  ASCORBIC ACID Take 1,000 mg by mouth at bedtime.   Vitamin D 1000 units capsule Take 1,000 Units by mouth every morning.   vitamin E 1000 UNIT capsule Take 1,000 Units by mouth at bedtime.      Follow-up Information    Aviva Signs, MD. Schedule an appointment as soon as possible for a visit on 09/29/2017.   Specialty:  General Surgery Contact information: [id number] [place] [address] [phone]           Signed: Aviva Signs 09/25/2017, 10:44 AM

## 2017-09-25 NOTE — Progress Notes (Signed)
  Subjective: Patient states her pain is under better control.  She is passing gas.  She is tolerating her diet well.  Objective: Vital signs in last 24 hours: Temp:  [98.1 F (36.7 C)-98.6 F (37 C)] 98.1 F (36.7 C) (05/05 0512) Pulse Rate:  [68-75] 75 (05/05 0512) Resp:  [18-20] 18 (05/05 0512) BP: (110)/(42-89) 110/43 (05/05 0512) SpO2:  [89 %-97 %] 89 % (05/05 0512) Last BM Date: 09/23/17  Intake/Output from previous day: 05/04 0701 - 05/05 0700 In: 1080 [P.O.:680; IV Piggyback:400] Out: 750 [Urine:750] Intake/Output this shift: No intake/output data recorded.  General appearance: alert, cooperative and no distress GI: Soft with tenderness in the left lower quadrant.  No rigidity noted.  Bowel sounds active.  Nondistended.  Lab Results:  Recent Labs    09/23/17 1852 09/24/17 0632  WBC 8.7 9.1  HGB 11.4* 11.8*  HCT 34.6* 36.0  PLT 290 286   BMET Recent Labs    09/23/17 1852 09/24/17 0632  NA 136 134*  K 3.9 4.0  CL 100* 99*  CO2 25 24  GLUCOSE 50* 105*  BUN 9 7  CREATININE 0.79 0.90  CALCIUM 8.9 8.6*   PT/INR No results for input(s): LABPROT, INR in the last 72 hours.  Studies/Results: Ct Abdomen Pelvis W Contrast  Result Date: 09/23/2017 CLINICAL DATA:  Abdominal pain, diverticulitis suspected EXAM: CT ABDOMEN AND PELVIS WITH CONTRAST TECHNIQUE: Multidetector CT imaging of the abdomen and pelvis was performed using the standard protocol following bolus administration of intravenous contrast. CONTRAST:  159mL ISOVUE-300 IOPAMIDOL (ISOVUE-300) INJECTION 61% COMPARISON:  CT abdomen/pelvis dated 09/10/2017. FINDINGS: Lower chest: Lung bases are clear. Hepatobiliary: Liver is within normal limits. Gallbladder is unremarkable. No intrahepatic or extrahepatic ductal dilatation. Pancreas: Within normal limits. Spleen: Within normal limits. Adrenals/Urinary Tract: Adrenal glands are within normal limits. Kidneys within normal limits.  No hydronephrosis. Mildly  thick-walled bladder. Stomach/Bowel: Stomach is within normal limits. No evidence of bowel obstruction. Prior appendectomy. Sigmoid diverticulosis, with mild pericolonic inflammatory changes in the left lower quadrant, reflecting acute diverticulitis. Associated 18 x 12 mm fluid/gas collection inferior to the sigmoid colon (series 2/image 60), suggesting a small pericolonic abscess, although this does not reflect a drainable fluid collection. No free air. Vascular/Lymphatic: No evidence of abdominal aortic aneurysm. Atherosclerotic calcifications of the abdominal aorta and branch vessels. No suspicious abdominopelvic lymphadenopathy. Reproductive: Uterus is within normal limits. Bilateral ovaries are within normal limits. Other: No abdominopelvic ascites. Musculoskeletal: Mild degenerative changes at L5-S1. IMPRESSION: Acute diverticulitis. 1.8 cm fluid/gas collection adjacent to the sigmoid colon, suggesting a small pericolonic abscess, although this does not reflect a drainable collection. No free air. Electronically Signed   By: Julian Hy M.D.   On: 09/23/2017 20:51    Anti-infectives: Anti-infectives (From admission, onward)   Start     Dose/Rate Route Frequency Ordered Stop   09/23/17 2000  metroNIDAZOLE (FLAGYL) IVPB 500 mg     500 mg 100 mL/hr over 60 Minutes Intravenous Every 8 hours 09/23/17 1749     09/23/17 1800  ciprofloxacin (CIPRO) IVPB 400 mg     400 mg 200 mL/hr over 60 Minutes Intravenous Every 12 hours 09/23/17 1749        Assessment/Plan: Impression: Sigmoid diverticulitis with abscess.  Pain is under better control. Plan: Will discharge home on Percocet for pain.  She already has ciprofloxacin and Flagyl at home.  I will see her in my office on 09/29/2017.  LOS: 1 day    Aviva Signs 09/25/2017

## 2017-09-26 ENCOUNTER — Emergency Department (HOSPITAL_COMMUNITY): Payer: Medicare Other

## 2017-09-26 ENCOUNTER — Other Ambulatory Visit: Payer: Self-pay

## 2017-09-26 ENCOUNTER — Encounter (HOSPITAL_COMMUNITY): Payer: Self-pay

## 2017-09-26 ENCOUNTER — Inpatient Hospital Stay (HOSPITAL_COMMUNITY)
Admission: EM | Admit: 2017-09-26 | Discharge: 2017-10-10 | DRG: 853 | Disposition: A | Discharge status: Home / Self Care | Payer: Medicare Other | Attending: Internal Medicine | Admitting: Internal Medicine

## 2017-09-26 DIAGNOSIS — R0602 Shortness of breath: Secondary | ICD-10-CM

## 2017-09-26 DIAGNOSIS — D6489 Other specified anemias: Secondary | ICD-10-CM | POA: Diagnosis not present

## 2017-09-26 DIAGNOSIS — K567 Ileus, unspecified: Secondary | ICD-10-CM | POA: Diagnosis not present

## 2017-09-26 DIAGNOSIS — Z978 Presence of other specified devices: Secondary | ICD-10-CM

## 2017-09-26 DIAGNOSIS — G8929 Other chronic pain: Secondary | ICD-10-CM | POA: Diagnosis not present

## 2017-09-26 DIAGNOSIS — Z4682 Encounter for fitting and adjustment of non-vascular catheter: Secondary | ICD-10-CM | POA: Diagnosis not present

## 2017-09-26 DIAGNOSIS — G9341 Metabolic encephalopathy: Secondary | ICD-10-CM | POA: Diagnosis not present

## 2017-09-26 DIAGNOSIS — Z794 Long term (current) use of insulin: Secondary | ICD-10-CM | POA: Diagnosis not present

## 2017-09-26 DIAGNOSIS — I10 Essential (primary) hypertension: Secondary | ICD-10-CM | POA: Diagnosis not present

## 2017-09-26 DIAGNOSIS — G8918 Other acute postprocedural pain: Secondary | ICD-10-CM | POA: Diagnosis not present

## 2017-09-26 DIAGNOSIS — K651 Peritoneal abscess: Secondary | ICD-10-CM | POA: Diagnosis not present

## 2017-09-26 DIAGNOSIS — R5381 Other malaise: Secondary | ICD-10-CM | POA: Diagnosis not present

## 2017-09-26 DIAGNOSIS — D72829 Elevated white blood cell count, unspecified: Secondary | ICD-10-CM | POA: Diagnosis not present

## 2017-09-26 DIAGNOSIS — E1165 Type 2 diabetes mellitus with hyperglycemia: Secondary | ICD-10-CM | POA: Diagnosis not present

## 2017-09-26 DIAGNOSIS — E785 Hyperlipidemia, unspecified: Secondary | ICD-10-CM | POA: Diagnosis present

## 2017-09-26 DIAGNOSIS — A419 Sepsis, unspecified organism: Secondary | ICD-10-CM | POA: Diagnosis not present

## 2017-09-26 DIAGNOSIS — E1110 Type 2 diabetes mellitus with ketoacidosis without coma: Secondary | ICD-10-CM | POA: Diagnosis not present

## 2017-09-26 DIAGNOSIS — R000 Tachycardia, unspecified: Secondary | ICD-10-CM | POA: Diagnosis present

## 2017-09-26 DIAGNOSIS — I4891 Unspecified atrial fibrillation: Secondary | ICD-10-CM | POA: Diagnosis not present

## 2017-09-26 DIAGNOSIS — Z79899 Other long term (current) drug therapy: Secondary | ICD-10-CM

## 2017-09-26 DIAGNOSIS — Z8673 Personal history of transient ischemic attack (TIA), and cerebral infarction without residual deficits: Secondary | ICD-10-CM

## 2017-09-26 DIAGNOSIS — Z8249 Family history of ischemic heart disease and other diseases of the circulatory system: Secondary | ICD-10-CM | POA: Diagnosis not present

## 2017-09-26 DIAGNOSIS — J45909 Unspecified asthma, uncomplicated: Secondary | ICD-10-CM | POA: Diagnosis present

## 2017-09-26 DIAGNOSIS — K5740 Diverticulitis of both small and large intestine with perforation and abscess without bleeding: Secondary | ICD-10-CM | POA: Diagnosis not present

## 2017-09-26 DIAGNOSIS — G9340 Encephalopathy, unspecified: Secondary | ICD-10-CM | POA: Diagnosis not present

## 2017-09-26 DIAGNOSIS — I471 Supraventricular tachycardia: Secondary | ICD-10-CM | POA: Diagnosis not present

## 2017-09-26 DIAGNOSIS — Z91030 Bee allergy status: Secondary | ICD-10-CM

## 2017-09-26 DIAGNOSIS — R918 Other nonspecific abnormal finding of lung field: Secondary | ICD-10-CM | POA: Diagnosis not present

## 2017-09-26 DIAGNOSIS — L02211 Cutaneous abscess of abdominal wall: Secondary | ICD-10-CM | POA: Diagnosis not present

## 2017-09-26 DIAGNOSIS — R300 Dysuria: Secondary | ICD-10-CM | POA: Diagnosis present

## 2017-09-26 DIAGNOSIS — R6521 Severe sepsis with septic shock: Secondary | ICD-10-CM | POA: Diagnosis present

## 2017-09-26 DIAGNOSIS — K5720 Diverticulitis of large intestine with perforation and abscess without bleeding: Secondary | ICD-10-CM | POA: Diagnosis present

## 2017-09-26 DIAGNOSIS — R630 Anorexia: Secondary | ICD-10-CM | POA: Diagnosis not present

## 2017-09-26 DIAGNOSIS — Z9842 Cataract extraction status, left eye: Secondary | ICD-10-CM | POA: Diagnosis not present

## 2017-09-26 DIAGNOSIS — N179 Acute kidney failure, unspecified: Secondary | ICD-10-CM | POA: Diagnosis not present

## 2017-09-26 DIAGNOSIS — Z7982 Long term (current) use of aspirin: Secondary | ICD-10-CM

## 2017-09-26 DIAGNOSIS — J810 Acute pulmonary edema: Secondary | ICD-10-CM | POA: Diagnosis not present

## 2017-09-26 DIAGNOSIS — J95821 Acute postprocedural respiratory failure: Secondary | ICD-10-CM | POA: Diagnosis not present

## 2017-09-26 DIAGNOSIS — E11649 Type 2 diabetes mellitus with hypoglycemia without coma: Secondary | ICD-10-CM | POA: Diagnosis present

## 2017-09-26 DIAGNOSIS — E1151 Type 2 diabetes mellitus with diabetic peripheral angiopathy without gangrene: Secondary | ICD-10-CM | POA: Diagnosis present

## 2017-09-26 DIAGNOSIS — Z9289 Personal history of other medical treatment: Secondary | ICD-10-CM

## 2017-09-26 DIAGNOSIS — R209 Unspecified disturbances of skin sensation: Secondary | ICD-10-CM | POA: Diagnosis not present

## 2017-09-26 DIAGNOSIS — M545 Low back pain: Secondary | ICD-10-CM | POA: Diagnosis not present

## 2017-09-26 DIAGNOSIS — E119 Type 2 diabetes mellitus without complications: Secondary | ICD-10-CM

## 2017-09-26 DIAGNOSIS — K9189 Other postprocedural complications and disorders of digestive system: Secondary | ICD-10-CM | POA: Diagnosis not present

## 2017-09-26 DIAGNOSIS — I340 Nonrheumatic mitral (valve) insufficiency: Secondary | ICD-10-CM | POA: Diagnosis not present

## 2017-09-26 DIAGNOSIS — E138 Other specified diabetes mellitus with unspecified complications: Secondary | ICD-10-CM | POA: Diagnosis not present

## 2017-09-26 DIAGNOSIS — I472 Ventricular tachycardia: Secondary | ICD-10-CM | POA: Diagnosis not present

## 2017-09-26 DIAGNOSIS — I480 Paroxysmal atrial fibrillation: Secondary | ICD-10-CM | POA: Diagnosis not present

## 2017-09-26 DIAGNOSIS — R52 Pain, unspecified: Secondary | ICD-10-CM

## 2017-09-26 DIAGNOSIS — Z8719 Personal history of other diseases of the digestive system: Secondary | ICD-10-CM

## 2017-09-26 DIAGNOSIS — K5730 Diverticulosis of large intestine without perforation or abscess without bleeding: Secondary | ICD-10-CM | POA: Diagnosis not present

## 2017-09-26 DIAGNOSIS — F17210 Nicotine dependence, cigarettes, uncomplicated: Secondary | ICD-10-CM | POA: Diagnosis present

## 2017-09-26 DIAGNOSIS — R0603 Acute respiratory distress: Secondary | ICD-10-CM

## 2017-09-26 DIAGNOSIS — H3530 Unspecified macular degeneration: Secondary | ICD-10-CM | POA: Diagnosis not present

## 2017-09-26 DIAGNOSIS — R109 Unspecified abdominal pain: Secondary | ICD-10-CM | POA: Diagnosis not present

## 2017-09-26 DIAGNOSIS — Z933 Colostomy status: Secondary | ICD-10-CM | POA: Diagnosis not present

## 2017-09-26 DIAGNOSIS — J9601 Acute respiratory failure with hypoxia: Secondary | ICD-10-CM | POA: Diagnosis not present

## 2017-09-26 DIAGNOSIS — I2510 Atherosclerotic heart disease of native coronary artery without angina pectoris: Secondary | ICD-10-CM | POA: Diagnosis not present

## 2017-09-26 DIAGNOSIS — K219 Gastro-esophageal reflux disease without esophagitis: Secondary | ICD-10-CM | POA: Diagnosis present

## 2017-09-26 DIAGNOSIS — E875 Hyperkalemia: Secondary | ICD-10-CM | POA: Diagnosis not present

## 2017-09-26 DIAGNOSIS — R10816 Epigastric abdominal tenderness: Secondary | ICD-10-CM | POA: Diagnosis not present

## 2017-09-26 DIAGNOSIS — J9811 Atelectasis: Secondary | ICD-10-CM | POA: Diagnosis not present

## 2017-09-26 DIAGNOSIS — Z882 Allergy status to sulfonamides status: Secondary | ICD-10-CM

## 2017-09-26 DIAGNOSIS — E1310 Other specified diabetes mellitus with ketoacidosis without coma: Secondary | ICD-10-CM | POA: Diagnosis not present

## 2017-09-26 DIAGNOSIS — R197 Diarrhea, unspecified: Secondary | ICD-10-CM | POA: Diagnosis not present

## 2017-09-26 DIAGNOSIS — J90 Pleural effusion, not elsewhere classified: Secondary | ICD-10-CM | POA: Diagnosis not present

## 2017-09-26 DIAGNOSIS — M79652 Pain in left thigh: Secondary | ICD-10-CM | POA: Diagnosis not present

## 2017-09-26 DIAGNOSIS — Z86010 Personal history of colonic polyps: Secondary | ICD-10-CM

## 2017-09-26 DIAGNOSIS — Z880 Allergy status to penicillin: Secondary | ICD-10-CM

## 2017-09-26 DIAGNOSIS — R112 Nausea with vomiting, unspecified: Secondary | ICD-10-CM | POA: Diagnosis not present

## 2017-09-26 DIAGNOSIS — E43 Unspecified severe protein-calorie malnutrition: Secondary | ICD-10-CM | POA: Diagnosis not present

## 2017-09-26 LAB — CBC WITH DIFFERENTIAL/PLATELET
BASOS ABS: 0 10*3/uL (ref 0.0–0.1)
Basophils Relative: 0 %
Eosinophils Absolute: 0.2 10*3/uL (ref 0.0–0.7)
Eosinophils Relative: 1 %
HCT: 35.1 % — ABNORMAL LOW (ref 36.0–46.0)
HEMOGLOBIN: 11.3 g/dL — AB (ref 12.0–15.0)
LYMPHS ABS: 1.1 10*3/uL (ref 0.7–4.0)
LYMPHS PCT: 8 %
MCH: 29.1 pg (ref 26.0–34.0)
MCHC: 32.2 g/dL (ref 30.0–36.0)
MCV: 90.5 fL (ref 78.0–100.0)
Monocytes Absolute: 1.3 10*3/uL — ABNORMAL HIGH (ref 0.1–1.0)
Monocytes Relative: 9 %
NEUTROS ABS: 11.4 10*3/uL — AB (ref 1.7–7.7)
NEUTROS PCT: 82 %
PLATELETS: 346 10*3/uL (ref 150–400)
RBC: 3.88 MIL/uL (ref 3.87–5.11)
RDW: 13 % (ref 11.5–15.5)
WBC: 14.1 10*3/uL — AB (ref 4.0–10.5)

## 2017-09-26 LAB — URINALYSIS, ROUTINE W REFLEX MICROSCOPIC
BACTERIA UA: NONE SEEN
Bilirubin Urine: NEGATIVE
Glucose, UA: NEGATIVE mg/dL
KETONES UR: 20 mg/dL — AB
Nitrite: NEGATIVE
PH: 6 (ref 5.0–8.0)
PROTEIN: NEGATIVE mg/dL
Specific Gravity, Urine: 1.014 (ref 1.005–1.030)

## 2017-09-26 LAB — LIPASE, BLOOD: Lipase: 20 U/L (ref 11–51)

## 2017-09-26 LAB — COMPREHENSIVE METABOLIC PANEL
ALBUMIN: 3.5 g/dL (ref 3.5–5.0)
ALT: 19 U/L (ref 14–54)
ANION GAP: 14 (ref 5–15)
AST: 27 U/L (ref 15–41)
Alkaline Phosphatase: 100 U/L (ref 38–126)
BILIRUBIN TOTAL: 0.7 mg/dL (ref 0.3–1.2)
BUN: 11 mg/dL (ref 6–20)
CHLORIDE: 96 mmol/L — AB (ref 101–111)
CO2: 24 mmol/L (ref 22–32)
Calcium: 9 mg/dL (ref 8.9–10.3)
Creatinine, Ser: 0.98 mg/dL (ref 0.44–1.00)
GFR calc Af Amer: >60 mL/min (ref >60)
GFR calc non Af Amer: 55 mL/min — ABNORMAL LOW (ref >60)
GLUCOSE: 74 mg/dL (ref 65–99)
POTASSIUM: 3.9 mmol/L (ref 3.5–5.1)
SODIUM: 134 mmol/L — AB (ref 135–145)
Total Protein: 6.9 g/dL (ref 6.5–8.1)

## 2017-09-26 LAB — TROPONIN I: Troponin I: <0.03 ng/mL (ref <0.03)

## 2017-09-26 LAB — LACTIC ACID, PLASMA: LACTIC ACID, VENOUS: 1.5 mmol/L (ref 0.5–1.9)

## 2017-09-26 MED ORDER — ONDANSETRON HCL 4 MG/2ML IJ SOLN
4.0000 mg | Freq: Once | INTRAMUSCULAR | Status: AC
  Administered 2017-09-26: 4 mg via INTRAVENOUS
  Filled 2017-09-26: qty 2

## 2017-09-26 MED ORDER — METRONIDAZOLE IN NACL 5-0.79 MG/ML-% IV SOLN
500.0000 mg | Freq: Once | INTRAVENOUS | Status: AC
  Administered 2017-09-27: 500 mg via INTRAVENOUS
  Filled 2017-09-26: qty 100

## 2017-09-26 MED ORDER — SODIUM CHLORIDE 0.9 % IV BOLUS
1000.0000 mL | Freq: Once | INTRAVENOUS | Status: AC
  Administered 2017-09-27: 1000 mL via INTRAVENOUS

## 2017-09-26 MED ORDER — CIPROFLOXACIN IN D5W 400 MG/200ML IV SOLN
400.0000 mg | Freq: Once | INTRAVENOUS | Status: AC
  Administered 2017-09-26: 400 mg via INTRAVENOUS
  Filled 2017-09-26: qty 200

## 2017-09-26 MED ORDER — ONDANSETRON HCL 4 MG PO TABS
ORAL_TABLET | ORAL | Status: AC
  Administered 2017-09-26: 4 mg
  Filled 2017-09-26: qty 1

## 2017-09-26 MED ORDER — FENTANYL CITRATE (PF) 100 MCG/2ML IJ SOLN
50.0000 ug | Freq: Once | INTRAMUSCULAR | Status: AC
  Administered 2017-09-26: 50 ug via INTRAVENOUS
  Filled 2017-09-26: qty 2

## 2017-09-26 MED ORDER — IOPAMIDOL (ISOVUE-300) INJECTION 61%
100.0000 mL | Freq: Once | INTRAVENOUS | Status: AC | PRN
  Administered 2017-09-26: 100 mL via INTRAVENOUS

## 2017-09-26 MED ORDER — SODIUM CHLORIDE 0.9 % IV BOLUS
1000.0000 mL | Freq: Once | INTRAVENOUS | Status: AC
  Administered 2017-09-26: 1000 mL via INTRAVENOUS

## 2017-09-26 NOTE — ED Notes (Signed)
Pt states she was recently xrayed when she was just here and admitted. Wants to wait for MD before agreeing to another scan.

## 2017-09-26 NOTE — ED Triage Notes (Signed)
Pt was discharged from here yesterday. Pt is having lower abdominal pain. Pt was diagnosed with Diverticulosis. Pt is also having nausea and vomiting. Symptoms started this morning at 10am. Pt describes pain as stabbing and cramping.

## 2017-09-26 NOTE — ED Provider Notes (Signed)
[facility] EMERGENCY DEPARTMENT Provider Note   CSN: [id number] Arrival date & time: 09/26/17  2112     History   Chief Complaint Chief Complaint  Patient presents with  . Abdominal Pain    HPI Barbara [name] is a 77 y.o. female.  Patient returns to the hospital with worsening lower abdominal pain, nausea, vomiting and diarrhea.  She was discharged from the hospital approximately 12 hours ago after being admitted for 2 days with diverticulitis and sigmoid abscess.  She was sent home on p.o. antibiotics and pain medication which she states she cannot keep down.  Emesis is nonbloody and nonbilious.  Reports multiple episodes of loose stool is nonbloody as well.  No fever, chills.  Pain radiates down her bilateral hips to her thighs.  Some pain with urination but no blood in the urine.  She was going to be scheduled for elective colectomy in several weeks.  The history is provided by the patient.  Abdominal Pain   Associated symptoms include diarrhea, nausea and vomiting. Pertinent negatives include fever, dysuria, hematuria and headaches.    Past Medical History:  Diagnosis Date  . Arteriosclerotic cardiovascular disease (ASCVD)    non-obstructive; negative stress nuclear and normal echo in 08/2009 by [facility] cardiac [facility] in 03/2000-20%  main 20% LAD, 30% circumflex, 30% RCA, hyperdynamic LV  . Asthma    Mild  . Chest pain   . Chronic back pain   . Diabetes mellitus    A1c of 7.6 in 04/2010; moderate to high dose insulin  . GERD (gastroesophageal reflux disease)   . Hyperlipidemia    total cholesterol of 271 and LDL of 187 in 07/2010  . Hypertension   . Macular degeneration   . Palpitations   . Stroke [facility]) 1993 2008   short term memory loss  . Tobacco abuse     Patient Active Problem List   Diagnosis Date Noted  . Acute diverticulitis 09/24/2017  . Unspecified atrial fibrillation ([place]) 09/23/2017  . Dizziness 07/28/2015  . Orthostatic hypotension  07/28/2015  . Rectal bleeding 12/05/2013  . Melena 12/05/2013  . Personal history of colonic polyps 12/05/2013  . Rapid atrial fibrillation ([place]) 08/15/2013  . Atrial fibrillation with RVR ([place]) 08/15/2013  . Right upper quadrant pain 11/16/2011  . Abdominal pain, acute, left lower quadrant 09/01/2011  . Cerebrovascular disease 10/09/2010  . Hypertension   . Hyperlipidemia   . Diabetes mellitus ([place])   . Arteriosclerotic cardiovascular disease (ASCVD)   . Macular degeneration   . Tobacco abuse   . Asthma   . Palpitations   . Chest pain     Past Surgical History:  Procedure Laterality Date  . APPENDECTOMY    . CATARACT EXTRACTION     left  . COLONOSCOPY  2009  . COLONOSCOPY  03/30/2012   Procedure: COLONOSCOPY;  Surgeon: Rogene Houston, MD;  Location: [facility] ENDO SUITE;  Service: Endoscopy;  Laterality: N/A;  730  . COLONOSCOPY N/A 12/13/2013   Procedure: COLONOSCOPY;  Surgeon: Rogene Houston, MD;  Location: [facility] ENDO SUITE;  Service: Endoscopy;  Laterality: N/A;  1200  . ESOPHAGOGASTRODUODENOSCOPY  10/21/2011   Procedure: ESOPHAGOGASTRODUODENOSCOPY (EGD);  Surgeon: Rogene Houston, MD;  Location: [facility] ENDO SUITE;  Service: Endoscopy;  Laterality: N/A;  1200  . ESOPHAGOGASTRODUODENOSCOPY N/A 12/13/2013   Procedure: ESOPHAGOGASTRODUODENOSCOPY (EGD);  Surgeon: Rogene Houston, MD;  Location: [facility] ENDO SUITE;  Service: Endoscopy;  Laterality: N/A;  . TUBAL LIGATION       OB  History   None      Home Medications    Prior to Admission medications   Medication Sig Start Date End Date Taking? Authorizing Provider  aspirin EC 325 MG tablet Take 325 mg by mouth every evening.    [provider]  B Complex Vitamins (VITAMIN B COMPLEX PO) Take 1 tablet by mouth daily.    [provider]  Bevacizumab (AVASTIN) 100 MG/4ML SOLN Inject 1.25 mg into the vein every 8 (eight) weeks.    [provider]  brimonidine (ALPHAGAN) 0.2 % ophthalmic solution Place 1 drop into  both eyes 3 (three) times daily.  01/31/15   [provider]  Calcium Carbonate Antacid (TUMS E-X SUGAR FREE PO) Take 2 tablets by mouth every morning.    [provider]  Cholecalciferol (VITAMIN D) 1000 UNITS capsule Take 1,000 Units by mouth every morning.     [provider]  flecainide (TAMBOCOR) 100 MG tablet TAKE 1 TABLET BY MOUTH TWICE DAILY 08/19/17   Deboraha Sprang, MD  insulin glargine (LANTUS) 100 UNIT/ML injection Inject 20 Units into the skin every morning.  08/16/13   Asencion Noble, MD  insulin regular (NOVOLIN R,HUMULIN R) 100 units/mL injection Inject 2-10 Units into the skin 3 (three) times daily as needed for high blood sugar (per sliding scale). SLIDING SCALE     [provider]  lisinopril (PRINIVIL,ZESTRIL) 5 MG tablet Take 2.5 mg by mouth every evening.  09/15/17   [provider]  metoprolol tartrate (LOPRESSOR) 25 MG tablet Take 25 mg by mouth daily as needed (high heart rate).  08/18/15   [provider]  Multiple Vitamins-Minerals (PRESERVISION AREDS 2) CAPS Take 1 capsule by mouth 2 (two) times daily.    [provider]  nitroGLYCERIN (NITROSTAT) 0.4 MG SL tablet Place 0.4 mg under the tongue every 5 (five) minutes x 3 doses as needed for chest pain.  06/02/11   Yehuda Savannah, MD  ondansetron (ZOFRAN ODT) 4 MG disintegrating tablet Take 1 tablet (4 mg total) by mouth every 8 (eight) hours as needed. Patient taking differently: Take 4 mg by mouth every 8 (eight) hours as needed for nausea or vomiting.  09/18/17   Fredia Sorrow, MD  oxyCODONE-acetaminophen (PERCOCET) 7.5-325 MG tablet Take 1 tablet by mouth every 6 (six) hours as needed. 09/25/17   Aviva Signs, MD  Polyethyl Glycol-Propyl Glycol (SYSTANE OP) Apply 1 drop to eye 2 (two) times daily.    [provider]  vitamin C (ASCORBIC ACID) 500 MG tablet Take 1,000 mg by mouth at bedtime.     [provider]  vitamin E 1000 UNIT capsule Take  1,000 Units by mouth at bedtime.     [provider]    Family History Family History  Problem Relation Age of Onset  . Heart attack Mother   . Cancer Father   . Cancer Sister   . Heart attack Brother   . Cancer Brother   . Cancer Sister   . Cancer Sister     Social History Social History   Tobacco Use  . Smoking status: Current Every Day Smoker    Packs/day: 0.25    Years: 15.00    Pack years: 3.75    Types: Cigarettes  . Smokeless tobacco: Never Used  Substance Use Topics  . Alcohol use: No  . Drug use: No     Allergies   Bee venom; Sulfamethoxazole; Penicillins; and Sulfonamide derivatives   Review of Systems  Review of Systems  Constitutional: Positive for activity change and appetite change. Negative for fever.  HENT: Negative for congestion.   Eyes: Negative for visual disturbance.  Respiratory: Negative for cough, chest tightness and shortness of breath.   Gastrointestinal: Positive for abdominal pain, diarrhea, nausea and vomiting.  Genitourinary: Negative for dysuria, genital sores, hematuria, vaginal bleeding and vaginal discharge.  Musculoskeletal: Positive for back pain.  Skin: Negative for rash.  Neurological: Negative for dizziness, seizures and headaches.    all other systems are negative except as noted in the HPI and PMH.    Physical Exam Updated Vital Signs BP (!) 99/49   Pulse 74   Temp 98.7 F (37.1 C) (Oral)   Resp 17   Wt 62.6 kg (138 lb)   SpO2 97%   BMI 24.45 kg/m   Physical Exam  Constitutional: She is oriented to person, place, and time. She appears well-developed and well-nourished. No distress.  Uncomfortable, mildly dry mucous membranes  HENT:  Head: Normocephalic and atraumatic.  Mouth/Throat: Oropharynx is clear and moist. No oropharyngeal exudate.  Eyes: Pupils are equal, round, and reactive to light. Conjunctivae and EOM are normal.  Neck: Normal range of motion. Neck supple.  No meningismus.    Cardiovascular: Normal rate, regular rhythm, normal heart sounds and intact distal pulses.  No murmur heard. Pulmonary/Chest: Effort normal and breath sounds normal. No respiratory distress.  Abdominal: Soft. There is tenderness. There is guarding. There is no rebound.  Diffuse lower abdominal tenderness with voluntary guarding, no rebound, no upper abdominal tenderness, abdomen is soft  Musculoskeletal: Normal range of motion. She exhibits tenderness. She exhibits no edema.  Paraspinal lumbar tenderness  Neurological: She is alert and oriented to person, place, and time. No cranial nerve deficit. She exhibits normal muscle tone. Coordination normal.  No ataxia on finger to nose bilaterally. No pronator drift. 5/5 strength throughout. CN 2-12 intact.Equal grip strength. Sensation intact.   Skin: Skin is warm.  Psychiatric: She has a normal mood and affect. Her behavior is normal.  Nursing note and vitals reviewed.    ED Treatments / Results  Labs (all labs ordered are listed, but only abnormal results are displayed) Labs Reviewed  URINALYSIS, ROUTINE W REFLEX MICROSCOPIC - Abnormal; Notable for the following components:      Result Value   Hgb urine dipstick SMALL (*)    Ketones, ur 20 (*)    Leukocytes, UA TRACE (*)    All other components within normal limits  COMPREHENSIVE METABOLIC PANEL - Abnormal; Notable for the following components:   Sodium 134 (*)    Chloride 96 (*)    GFR calc non Af Amer 55 (*)    All other components within normal limits  CBC WITH DIFFERENTIAL/PLATELET - Abnormal; Notable for the following components:   WBC 14.1 (*)    Hemoglobin 11.3 (*)    HCT 35.1 (*)    Neutro Abs 11.4 (*)    Monocytes Absolute 1.3 (*)    All other components within normal limits  COMPREHENSIVE METABOLIC PANEL - Abnormal; Notable for the following components:   Chloride 100 (*)    Glucose, Bld 183 (*)    Calcium 8.1 (*)    Total Protein 5.9 (*)    Albumin 2.9 (*)    GFR  calc non Af Amer 58 (*)    All other components within normal limits  GLUCOSE, CAPILLARY - Abnormal; Notable for the following components:   Glucose-Capillary 192 (*)    All  other components within normal limits  GLUCOSE, CAPILLARY - Abnormal; Notable for the following components:   Glucose-Capillary 163 (*)    All other components within normal limits  URINE CULTURE  MRSA PCR SCREENING  LIPASE, BLOOD  TROPONIN I  LACTIC ACID, PLASMA  LACTIC ACID, PLASMA  PROTIME-INR  HEMOGLOBIN A1C    EKG EKG Interpretation  Date/Time:  Monday Sep 26 2017 22:16:26 EDT Ventricular Rate:  71 PR Interval:    QRS Duration: 101 QT Interval:  408 QTC Calculation: 444 R Axis:   71 Text Interpretation:  Sinus rhythm Abnormal R-wave progression, early transition Nonspecific T abnormalities, lateral leads No significant change was found Confirmed by Ezequiel Essex [phone]) on 09/26/2017 10:55:51 PM   Radiology Ct Abdomen Pelvis W Contrast  Result Date: 09/27/2017 CLINICAL DATA:  77 y/o  F; lower abdominal pain. EXAM: CT ABDOMEN AND PELVIS WITH CONTRAST TECHNIQUE: Multidetector CT imaging of the abdomen and pelvis was performed using the standard protocol following bolus administration of intravenous contrast. CONTRAST:  181mL ISOVUE-300 IOPAMIDOL (ISOVUE-300) INJECTION 61% COMPARISON:  09/23/2017 CT abdomen and pelvis FINDINGS: Lower chest: No acute abnormality. Hepatobiliary: No focal liver abnormality is seen. No gallstones, gallbladder wall thickening, or biliary dilatation. Pancreas: Unremarkable. No pancreatic ductal dilatation or surrounding inflammatory changes. Spleen: Normal in size without focal abnormality. Adrenals/Urinary Tract: Adrenal glands are unremarkable. Kidneys are normal, without renal calculi, focal lesion, or hydronephrosis. Bladder is unremarkable. Stomach/Bowel: Acute perforated diverticulitis (series 5, image 49) with oblong fluid collection extending between the bladder and the  uterus measuring 7.1 cm in length and 1.8 cm in diameter (series 2, image 67 and series 5, image 45), increased in size in comparison with the prior CT of the abdomen and pelvis. Contrast and debris is present within the perforated collection. Stomach and small bowel are unremarkable. Vascular/Lymphatic: Aortic atherosclerosis. No enlarged abdominal or pelvic lymph nodes. Reproductive: Uterus and bilateral adnexa are unremarkable. Other: No abdominal wall hernia or abnormality. No abdominopelvic ascites. Musculoskeletal: No acute or significant osseous findings. IMPRESSION: Acute perforated diverticulitis. Pericolonic abscess extending between the bladder and uterus has increased in size and surrounding inflammation. Contrast and debris extends into the abscess cavity. Electronically Signed   By: Kristine Garbe M.D.   On: 09/27/2017 00:34    Procedures Procedures (including critical care time)  Medications Ordered in ED Medications  sodium chloride 0.9 % bolus 1,000 mL (has no administration in time range)  sodium chloride 0.9 % bolus 1,000 mL (1,000 mLs Intravenous New Bag/Given 09/26/17 2319)  ciprofloxacin (CIPRO) IVPB 400 mg (400 mg Intravenous New Bag/Given 09/26/17 2320)  metroNIDAZOLE (FLAGYL) IVPB 500 mg (has no administration in time range)  iopamidol (ISOVUE-300) 61 % injection 100 mL (has no administration in time range)  ondansetron (ZOFRAN) 4 MG tablet (4 mg  Given 09/26/17 2130)  ondansetron (ZOFRAN) injection 4 mg (4 mg Intravenous Given 09/26/17 2317)  fentaNYL (SUBLIMAZE) injection 50 mcg (50 mcg Intravenous Given 09/26/17 2318)     Initial Impression / Assessment and Plan / ED Course  I have reviewed the triage vital signs and the nursing notes.  Pertinent labs & imaging results that were available during my care of the patient were reviewed by me and considered in my medical decision making (see chart for details).    Patient with recent admission for diverticulitis with  abscess returns with lower abdominal pain, nausea vomiting diarrhea.  Blood pressure soft on arrival and she is hydrated.  Lactate is normal but WBC count is  increased.  Discussed with patient that repeat CT scan recommended to evaluate for progression of abscess and rule out perforation.  Patient's blood pressure has improved after receiving IV fluids.  Labs show leukocytosis.  Repeat CT scan shows perforated diverticulitis with contained abscess 2 x 8 cm.  This was discussed with Dr. Arnoldo Morale of surgery.  He recommends medical admission for IV antibiotics and IR drainage.  He is familiar with patient and will consult on her in the morning.  Admission discussed with Dr. Darrick Meigs. Final Clinical Impressions(s) / ED Diagnoses   Final diagnoses:  Diverticulitis of both large and small intestine with perforation and abscess without bleeding    ED Discharge Orders    None       Giovanni Biby, Annie Main, MD 09/27/17 [phone]

## 2017-09-27 ENCOUNTER — Encounter (HOSPITAL_COMMUNITY): Payer: Self-pay | Admitting: Anesthesiology

## 2017-09-27 ENCOUNTER — Encounter (HOSPITAL_COMMUNITY): Payer: Self-pay | Admitting: *Deleted

## 2017-09-27 DIAGNOSIS — E119 Type 2 diabetes mellitus without complications: Secondary | ICD-10-CM | POA: Diagnosis not present

## 2017-09-27 DIAGNOSIS — Z4682 Encounter for fitting and adjustment of non-vascular catheter: Secondary | ICD-10-CM | POA: Diagnosis not present

## 2017-09-27 DIAGNOSIS — G8929 Other chronic pain: Secondary | ICD-10-CM | POA: Diagnosis present

## 2017-09-27 DIAGNOSIS — E138 Other specified diabetes mellitus with unspecified complications: Secondary | ICD-10-CM | POA: Diagnosis not present

## 2017-09-27 DIAGNOSIS — J45909 Unspecified asthma, uncomplicated: Secondary | ICD-10-CM | POA: Diagnosis present

## 2017-09-27 DIAGNOSIS — N179 Acute kidney failure, unspecified: Secondary | ICD-10-CM | POA: Diagnosis present

## 2017-09-27 DIAGNOSIS — I471 Supraventricular tachycardia: Secondary | ICD-10-CM | POA: Diagnosis not present

## 2017-09-27 DIAGNOSIS — I340 Nonrheumatic mitral (valve) insufficiency: Secondary | ICD-10-CM | POA: Diagnosis not present

## 2017-09-27 DIAGNOSIS — I4891 Unspecified atrial fibrillation: Secondary | ICD-10-CM | POA: Diagnosis not present

## 2017-09-27 DIAGNOSIS — K5740 Diverticulitis of both small and large intestine with perforation and abscess without bleeding: Secondary | ICD-10-CM | POA: Diagnosis not present

## 2017-09-27 DIAGNOSIS — K567 Ileus, unspecified: Secondary | ICD-10-CM | POA: Diagnosis not present

## 2017-09-27 DIAGNOSIS — L02211 Cutaneous abscess of abdominal wall: Secondary | ICD-10-CM | POA: Diagnosis not present

## 2017-09-27 DIAGNOSIS — Z933 Colostomy status: Secondary | ICD-10-CM | POA: Diagnosis not present

## 2017-09-27 DIAGNOSIS — F17210 Nicotine dependence, cigarettes, uncomplicated: Secondary | ICD-10-CM | POA: Diagnosis present

## 2017-09-27 DIAGNOSIS — J9601 Acute respiratory failure with hypoxia: Secondary | ICD-10-CM | POA: Diagnosis not present

## 2017-09-27 DIAGNOSIS — I480 Paroxysmal atrial fibrillation: Secondary | ICD-10-CM | POA: Diagnosis not present

## 2017-09-27 DIAGNOSIS — R109 Unspecified abdominal pain: Secondary | ICD-10-CM | POA: Diagnosis not present

## 2017-09-27 DIAGNOSIS — J810 Acute pulmonary edema: Secondary | ICD-10-CM | POA: Diagnosis not present

## 2017-09-27 DIAGNOSIS — R0602 Shortness of breath: Secondary | ICD-10-CM | POA: Diagnosis not present

## 2017-09-27 DIAGNOSIS — E1151 Type 2 diabetes mellitus with diabetic peripheral angiopathy without gangrene: Secondary | ICD-10-CM | POA: Diagnosis present

## 2017-09-27 DIAGNOSIS — R5381 Other malaise: Secondary | ICD-10-CM | POA: Diagnosis not present

## 2017-09-27 DIAGNOSIS — R918 Other nonspecific abnormal finding of lung field: Secondary | ICD-10-CM | POA: Diagnosis not present

## 2017-09-27 DIAGNOSIS — G9341 Metabolic encephalopathy: Secondary | ICD-10-CM | POA: Diagnosis present

## 2017-09-27 DIAGNOSIS — E11649 Type 2 diabetes mellitus with hypoglycemia without coma: Secondary | ICD-10-CM | POA: Diagnosis present

## 2017-09-27 DIAGNOSIS — R300 Dysuria: Secondary | ICD-10-CM | POA: Diagnosis present

## 2017-09-27 DIAGNOSIS — K9189 Other postprocedural complications and disorders of digestive system: Secondary | ICD-10-CM | POA: Diagnosis not present

## 2017-09-27 DIAGNOSIS — E785 Hyperlipidemia, unspecified: Secondary | ICD-10-CM | POA: Diagnosis present

## 2017-09-27 DIAGNOSIS — I2510 Atherosclerotic heart disease of native coronary artery without angina pectoris: Secondary | ICD-10-CM | POA: Diagnosis present

## 2017-09-27 DIAGNOSIS — Z8719 Personal history of other diseases of the digestive system: Secondary | ICD-10-CM | POA: Diagnosis not present

## 2017-09-27 DIAGNOSIS — J9811 Atelectasis: Secondary | ICD-10-CM | POA: Diagnosis not present

## 2017-09-27 DIAGNOSIS — I10 Essential (primary) hypertension: Secondary | ICD-10-CM | POA: Diagnosis not present

## 2017-09-27 DIAGNOSIS — R209 Unspecified disturbances of skin sensation: Secondary | ICD-10-CM | POA: Diagnosis not present

## 2017-09-27 DIAGNOSIS — R6521 Severe sepsis with septic shock: Secondary | ICD-10-CM | POA: Diagnosis not present

## 2017-09-27 DIAGNOSIS — K5720 Diverticulitis of large intestine with perforation and abscess without bleeding: Secondary | ICD-10-CM | POA: Diagnosis not present

## 2017-09-27 DIAGNOSIS — E1110 Type 2 diabetes mellitus with ketoacidosis without coma: Secondary | ICD-10-CM | POA: Diagnosis not present

## 2017-09-27 DIAGNOSIS — J95821 Acute postprocedural respiratory failure: Secondary | ICD-10-CM | POA: Diagnosis not present

## 2017-09-27 DIAGNOSIS — H3530 Unspecified macular degeneration: Secondary | ICD-10-CM | POA: Diagnosis present

## 2017-09-27 DIAGNOSIS — Z794 Long term (current) use of insulin: Secondary | ICD-10-CM | POA: Diagnosis not present

## 2017-09-27 DIAGNOSIS — R630 Anorexia: Secondary | ICD-10-CM | POA: Diagnosis not present

## 2017-09-27 DIAGNOSIS — J90 Pleural effusion, not elsewhere classified: Secondary | ICD-10-CM | POA: Diagnosis not present

## 2017-09-27 DIAGNOSIS — I472 Ventricular tachycardia: Secondary | ICD-10-CM | POA: Diagnosis not present

## 2017-09-27 DIAGNOSIS — K5730 Diverticulosis of large intestine without perforation or abscess without bleeding: Secondary | ICD-10-CM | POA: Diagnosis not present

## 2017-09-27 DIAGNOSIS — K219 Gastro-esophageal reflux disease without esophagitis: Secondary | ICD-10-CM | POA: Diagnosis present

## 2017-09-27 DIAGNOSIS — E43 Unspecified severe protein-calorie malnutrition: Secondary | ICD-10-CM | POA: Diagnosis not present

## 2017-09-27 DIAGNOSIS — G9340 Encephalopathy, unspecified: Secondary | ICD-10-CM | POA: Diagnosis not present

## 2017-09-27 DIAGNOSIS — E1165 Type 2 diabetes mellitus with hyperglycemia: Secondary | ICD-10-CM | POA: Diagnosis not present

## 2017-09-27 DIAGNOSIS — D72829 Elevated white blood cell count, unspecified: Secondary | ICD-10-CM | POA: Diagnosis not present

## 2017-09-27 DIAGNOSIS — Z8673 Personal history of transient ischemic attack (TIA), and cerebral infarction without residual deficits: Secondary | ICD-10-CM | POA: Diagnosis not present

## 2017-09-27 DIAGNOSIS — E1310 Other specified diabetes mellitus with ketoacidosis without coma: Secondary | ICD-10-CM | POA: Diagnosis not present

## 2017-09-27 DIAGNOSIS — K651 Peritoneal abscess: Secondary | ICD-10-CM | POA: Diagnosis not present

## 2017-09-27 DIAGNOSIS — A419 Sepsis, unspecified organism: Secondary | ICD-10-CM | POA: Diagnosis not present

## 2017-09-27 DIAGNOSIS — G8918 Other acute postprocedural pain: Secondary | ICD-10-CM | POA: Diagnosis not present

## 2017-09-27 LAB — HEMOGLOBIN A1C
Hgb A1c MFr Bld: 6.4 % — ABNORMAL HIGH (ref 4.8–5.6)
Mean Plasma Glucose: 136.98 mg/dL

## 2017-09-27 LAB — COMPREHENSIVE METABOLIC PANEL
ALT: 16 U/L (ref 14–54)
ANION GAP: 10 (ref 5–15)
AST: 24 U/L (ref 15–41)
Albumin: 2.9 g/dL — ABNORMAL LOW (ref 3.5–5.0)
Alkaline Phosphatase: 87 U/L (ref 38–126)
BUN: 8 mg/dL (ref 6–20)
CHLORIDE: 100 mmol/L — AB (ref 101–111)
CO2: 25 mmol/L (ref 22–32)
Calcium: 8.1 mg/dL — ABNORMAL LOW (ref 8.9–10.3)
Creatinine, Ser: 0.95 mg/dL (ref 0.44–1.00)
GFR calc non Af Amer: 58 mL/min — ABNORMAL LOW (ref >60)
Glucose, Bld: 183 mg/dL — ABNORMAL HIGH (ref 65–99)
POTASSIUM: 4.4 mmol/L (ref 3.5–5.1)
SODIUM: 135 mmol/L (ref 135–145)
Total Bilirubin: 0.7 mg/dL (ref 0.3–1.2)
Total Protein: 5.9 g/dL — ABNORMAL LOW (ref 6.5–8.1)

## 2017-09-27 LAB — GLUCOSE, CAPILLARY
GLUCOSE-CAPILLARY: 163 mg/dL — AB (ref 65–99)
GLUCOSE-CAPILLARY: 156 mg/dL — AB (ref 65–99)
Glucose-Capillary: 192 mg/dL — ABNORMAL HIGH (ref 65–99)
Glucose-Capillary: 133 mg/dL — ABNORMAL HIGH (ref 65–99)
Glucose-Capillary: 59 mg/dL — ABNORMAL LOW (ref 65–99)
Glucose-Capillary: 118 mg/dL — ABNORMAL HIGH (ref 65–99)

## 2017-09-27 LAB — PROTIME-INR
INR: 1.03
PROTHROMBIN TIME: 13.4 s (ref 11.4–15.2)

## 2017-09-27 LAB — LACTIC ACID, PLASMA: LACTIC ACID, VENOUS: 1 mmol/L (ref 0.5–1.9)

## 2017-09-27 MED ORDER — FLECAINIDE ACETATE 100 MG PO TABS
100.0000 mg | ORAL_TABLET | Freq: Two times a day (BID) | ORAL | Status: DC
  Administered 2017-09-27: 100 mg via ORAL
  Filled 2017-09-27 (×3): qty 1

## 2017-09-27 MED ORDER — BEVACIZUMAB CHEMO INJECTION 100 MG/4ML: 1.2500 mg | INTRAVENOUS | Status: DC

## 2017-09-27 MED ORDER — CIPROFLOXACIN IN D5W 400 MG/200ML IV SOLN
400.0000 mg | Freq: Two times a day (BID) | INTRAVENOUS | Status: DC
  Administered 2017-09-27: 400 mg via INTRAVENOUS
  Filled 2017-09-27: qty 200

## 2017-09-27 MED ORDER — HYDROMORPHONE HCL 1 MG/ML IJ SOLN
1.0000 mg | Freq: Once | INTRAMUSCULAR | Status: AC
  Administered 2017-09-27: 1 mg via INTRAVENOUS
  Filled 2017-09-27: qty 1

## 2017-09-27 MED ORDER — DEXTROSE 50 % IV SOLN
INTRAVENOUS | Status: AC
Start: 2017-09-27 — End: 2017-09-27
  Administered 2017-09-27: 25 mL
  Filled 2017-09-27: qty 50

## 2017-09-27 MED ORDER — ACETAMINOPHEN 325 MG PO TABS: 650.0000 mg | ORAL_TABLET | Freq: Four times a day (QID) | ORAL | Status: DC | PRN

## 2017-09-27 MED ORDER — SODIUM CHLORIDE 0.9 % IV SOLN
INTRAVENOUS | Status: DC
  Administered 2017-09-27 – 2017-09-29 (×6): via INTRAVENOUS

## 2017-09-27 MED ORDER — BRIMONIDINE TARTRATE 0.2 % OP SOLN
1.0000 [drp] | Freq: Three times a day (TID) | OPHTHALMIC | Status: DC
  Administered 2017-09-27 – 2017-10-10 (×39): 1 [drp] via OPHTHALMIC
  Filled 2017-09-27 (×2): qty 5

## 2017-09-27 MED ORDER — HYDROMORPHONE HCL 1 MG/ML IJ SOLN
0.5000 mg | Freq: Once | INTRAMUSCULAR | Status: AC
  Administered 2017-09-27: 0.5 mg via INTRAVENOUS
  Filled 2017-09-27: qty 1

## 2017-09-27 MED ORDER — ONDANSETRON HCL 4 MG/2ML IJ SOLN
4.0000 mg | Freq: Four times a day (QID) | INTRAMUSCULAR | Status: DC | PRN
  Administered 2017-09-27 – 2017-10-06 (×3): 4 mg via INTRAVENOUS
  Filled 2017-09-27 (×3): qty 2

## 2017-09-27 MED ORDER — ACETAMINOPHEN 650 MG RE SUPP: 650.0000 mg | Freq: Four times a day (QID) | RECTAL | Status: DC | PRN

## 2017-09-27 MED ORDER — ENOXAPARIN SODIUM 40 MG/0.4ML ~~LOC~~ SOLN
40.0000 mg | SUBCUTANEOUS | Status: DC
  Filled 2017-09-27: qty 0.4

## 2017-09-27 MED ORDER — ONDANSETRON HCL 4 MG PO TABS: 4.0000 mg | ORAL_TABLET | Freq: Four times a day (QID) | ORAL | Status: DC | PRN

## 2017-09-27 MED ORDER — ENOXAPARIN SODIUM 30 MG/0.3ML ~~LOC~~ SOLN
30.0000 mg | Freq: Every day | SUBCUTANEOUS | Status: DC
  Administered 2017-09-28 – 2017-09-29 (×2): 30 mg via SUBCUTANEOUS
  Filled 2017-09-27 (×3): qty 0.3

## 2017-09-27 MED ORDER — METRONIDAZOLE IN NACL 5-0.79 MG/ML-% IV SOLN
500.0000 mg | Freq: Three times a day (TID) | INTRAVENOUS | Status: DC
  Administered 2017-09-27: 500 mg via INTRAVENOUS
  Filled 2017-09-27: qty 100

## 2017-09-27 MED ORDER — SODIUM CHLORIDE 0.9 % IV SOLN
1.0000 g | Freq: Two times a day (BID) | INTRAVENOUS | Status: DC
  Administered 2017-09-27 – 2017-10-02 (×11): 1 g via INTRAVENOUS
  Filled 2017-09-27 (×12): qty 1

## 2017-09-27 MED ORDER — INSULIN ASPART 100 UNIT/ML ~~LOC~~ SOLN
0.0000 [IU] | Freq: Three times a day (TID) | SUBCUTANEOUS | Status: DC
  Administered 2017-09-27 – 2017-09-28 (×3): 2 [IU] via SUBCUTANEOUS
  Administered 2017-09-28: 1 [IU] via SUBCUTANEOUS
  Administered 2017-09-28: 2 [IU] via SUBCUTANEOUS

## 2017-09-27 MED ORDER — HYDROMORPHONE HCL 1 MG/ML IJ SOLN
1.0000 mg | INTRAMUSCULAR | Status: DC | PRN
  Administered 2017-09-27 – 2017-09-29 (×17): 1 mg via INTRAVENOUS
  Filled 2017-09-27 (×18): qty 1

## 2017-09-27 MED ORDER — HYDROMORPHONE HCL 1 MG/ML IJ SOLN
1.0000 mg | INTRAMUSCULAR | Status: DC | PRN
Start: 2017-09-27 — End: 2017-09-27
  Administered 2017-09-27: 1 mg via INTRAVENOUS
  Filled 2017-09-27: qty 1

## 2017-09-27 NOTE — H&P (Signed)
TRH H&P    Patient Demographics:    Barbara [name], is a 77 y.o. female  MRN: [id number]  DOB - Dec 09, 1940  Admit Date - 09/26/2017  Referring MD/NP/PA: Dr.Rancour  Outpatient Primary MD for the patient is Barbara Noble, MD  Patient coming from: Home  Chief complaint-abdominal pain   HPI:    Barbara [name]  is a 77 y.o. female, with a history of diabetes mellitus, hypertension who was recently admitted to the hospital with sigmoid radiculitis with abscess and discharged on Cipro and Flagyl on 09/25/2017.  As per patient's daughter patient became worse at home, had worsening of abdominal pain along with nausea vomiting and diarrhea.  She had multiple episodes of loose stool which were nonbloody.  Complains of dysuria Denies chest pain or shortness of breath. Denies passing out.  No headache no blurred vision. No previous history of stroke or seizures.  In the ED CT scan of the abdomen pelvis was done which showed diverticular perforation.  General surgery was consulted and recommended patient to be started on antibiotics.  Dr. Arnoldo Morale will see the patient in a.m.    Review of systems:      All other systems reviewed and are negative.   With Past History of the following :    Past Medical History:  Diagnosis Date  . Arteriosclerotic cardiovascular disease (ASCVD)    non-obstructive; negative stress nuclear and normal echo in 08/2009 by [facility] cardiac [facility] in 03/2000-20%  main 20% LAD, 30% circumflex, 30% RCA, hyperdynamic LV  . Asthma    Mild  . Chest pain   . Chronic back pain   . Diabetes mellitus    A1c of 7.6 in 04/2010; moderate to high dose insulin  . GERD (gastroesophageal reflux disease)   . Hyperlipidemia    total cholesterol of 271 and LDL of 187 in 07/2010  . Hypertension   . Macular degeneration   . Palpitations   . Stroke [facility]) 1993 2008   short term memory loss  . Tobacco  abuse       Past Surgical History:  Procedure Laterality Date  . APPENDECTOMY    . CATARACT EXTRACTION     left  . COLONOSCOPY  2009  . COLONOSCOPY  03/30/2012   Procedure: COLONOSCOPY;  Surgeon: Rogene Houston, MD;  Location: [facility];  Service: Endoscopy;  Laterality: N/A;  730  . COLONOSCOPY N/A 12/13/2013   Procedure: COLONOSCOPY;  Surgeon: Rogene Houston, MD;  Location: [facility];  Service: Endoscopy;  Laterality: N/A;  1200  . ESOPHAGOGASTRODUODENOSCOPY  10/21/2011   Procedure: ESOPHAGOGASTRODUODENOSCOPY (EGD);  Surgeon: Rogene Houston, MD;  Location: [facility];  Service: Endoscopy;  Laterality: N/A;  1200  . ESOPHAGOGASTRODUODENOSCOPY N/A 12/13/2013   Procedure: ESOPHAGOGASTRODUODENOSCOPY (EGD);  Surgeon: Rogene Houston, MD;  Location: [facility];  Service: Endoscopy;  Laterality: N/A;  . TUBAL LIGATION        Social History:      Social History   Tobacco Use  . Smoking status: Current Every Day  Smoker    Packs/day: 0.25    Years: 15.00    Pack years: 3.75    Types: Cigarettes  . Smokeless tobacco: Never Used  Substance Use Topics  . Alcohol use: No       Family History :     Family History  Problem Relation Age of Onset  . Heart attack Mother   . Cancer Father   . Cancer Sister   . Heart attack Brother   . Cancer Brother   . Cancer Sister   . Cancer Sister       Home Medications:   Prior to Admission medications   Medication Sig Start Date End Date Taking? Authorizing Provider  aspirin EC 325 MG tablet Take 325 mg by mouth every evening.    [provider]  B Complex Vitamins (VITAMIN B COMPLEX PO) Take 1 tablet by mouth daily.    [provider]  Bevacizumab (AVASTIN) 100 MG/4ML SOLN Inject 1.25 mg into the vein every 8 (eight) weeks.    [provider]  brimonidine (ALPHAGAN) 0.2 % ophthalmic solution Place 1 drop into both eyes 3 (three) times daily.  01/31/15   [provider]  Calcium  Carbonate Antacid (TUMS E-X SUGAR FREE PO) Take 2 tablets by mouth every morning.    [provider]  Cholecalciferol (VITAMIN D) 1000 UNITS capsule Take 1,000 Units by mouth every morning.     [provider]  flecainide (TAMBOCOR) 100 MG tablet TAKE 1 TABLET BY MOUTH TWICE DAILY 08/19/17   Deboraha Sprang, MD  insulin glargine (LANTUS) 100 UNIT/ML injection Inject 20 Units into the skin every morning.  08/16/13   Barbara Noble, MD  insulin regular (NOVOLIN R,HUMULIN R) 100 units/mL injection Inject 2-10 Units into the skin 3 (three) times daily as needed for high blood sugar (per sliding scale). SLIDING SCALE     [provider]  lisinopril (PRINIVIL,ZESTRIL) 5 MG tablet Take 2.5 mg by mouth every evening.  09/15/17   [provider]  metoprolol tartrate (LOPRESSOR) 25 MG tablet Take 25 mg by mouth daily as needed (high heart rate).  08/18/15   [provider]  Multiple Vitamins-Minerals (PRESERVISION AREDS 2) CAPS Take 1 capsule by mouth 2 (two) times daily.    [provider]  nitroGLYCERIN (NITROSTAT) 0.4 MG SL tablet Place 0.4 mg under the tongue every 5 (five) minutes x 3 doses as needed for chest pain.  06/02/11   Yehuda Savannah, MD  ondansetron (ZOFRAN ODT) 4 MG disintegrating tablet Take 1 tablet (4 mg total) by mouth every 8 (eight) hours as needed. Patient taking differently: Take 4 mg by mouth every 8 (eight) hours as needed for nausea or vomiting.  09/18/17   Fredia Sorrow, MD  oxyCODONE-acetaminophen (PERCOCET) 7.5-325 MG tablet Take 1 tablet by mouth every 6 (six) hours as needed. 09/25/17   Aviva Signs, MD  Polyethyl Glycol-Propyl Glycol (SYSTANE OP) Apply 1 drop to eye 2 (two) times daily.    [provider]  vitamin C (ASCORBIC ACID) 500 MG tablet Take 1,000 mg by mouth at bedtime.     [provider]  vitamin E 1000 UNIT capsule Take 1,000 Units by mouth at bedtime.     [provider]     Allergies:         Physical Exam:   Vitals  Blood pressure (!) 116/56, pulse 81, temperature 98.7 F (37.1 C), temperature source Oral, resp. rate 16, weight 62.6 kg (138  lb), SpO2 100 %.  1.  General: Appears in no acute distress  2. Psychiatric:  Intact judgement and  insight, awake alert, oriented x 3.  3. Neurologic: No focal neurological deficits, all cranial nerves intact.Strength 5/5 all 4 extremities, sensation intact all 4 extremities, plantars down going.  4. Eyes :  anicteric sclerae, moist conjunctivae with no lid lag. PERRLA.  5. ENMT:  Oropharynx clear with moist mucous membranes and good dentition  6. Neck:  supple, no cervical lymphadenopathy appriciated, No thyromegaly  7. Respiratory : Normal respiratory effort, good air movement bilaterally,clear to  auscultation bilaterally  8. Cardiovascular : RRR, no gallops, rubs or murmurs, no leg edema  9. Gastrointestinal:  Positive bowel sounds, abdomen soft,tender to palpation in left and right lower quadrants,no hepatosplenomegaly, no rigidity or guarding       10. Skin:  No cyanosis, normal texture and turgor, no rash, lesions or ulcers  11.Musculoskeletal:  Good muscle tone,  joints appear normal , no effusions,  normal range of motion    Data Review:    CBC Recent Labs  Lab 09/23/17 1852 09/24/17 0632 09/26/17 2207  WBC 8.7 9.1 14.1*  HGB 11.4* 11.8* 11.3*  HCT 34.6* 36.0 35.1*  PLT 290 286 346  MCV 91.8 92.5 90.5  MCH 30.2 30.3 29.1  MCHC 32.9 32.8 32.2  RDW 13.0 13.1 13.0  LYMPHSABS 1.7  --  1.1  MONOABS 1.1*  --  1.3*  EOSABS 0.4  --  0.2  BASOSABS 0.1  --  0.0   ------------------------------------------------------------------------------------------------------------------  Chemistries  Recent Labs  Lab 09/23/17 1852 09/24/17 0632 09/26/17 2207  NA 136 134* 134*  K 3.9 4.0 3.9  CL 100* 99* 96*  CO2 25 24 24   GLUCOSE 50* 105* 74  BUN 9 7 11   CREATININE 0.79 0.90 0.98  CALCIUM  8.9 8.6* 9.0  AST 24  --  27  ALT 19  --  19  ALKPHOS 123  --  100  BILITOT 0.5  --  0.7   ------------------------------------------------------------------------------------------------------------------  ------------------------------------------------------------------------------------------------------------------ GFR: Estimated Creatinine Clearance: 41.7 mL/min (by C-G formula based on SCr of 0.98 mg/dL). Liver Function Tests: Recent Labs  Lab 09/23/17 1852 09/26/17 2207  AST 24 27  ALT 19 19  ALKPHOS 123 100  BILITOT 0.5 0.7  PROT 6.7 6.9  ALBUMIN 3.4* 3.5   Recent Labs  Lab 09/26/17 2207  LIPASE 20   No results for input(s): AMMONIA in the last 168 hours. Coagulation Profile: No results for input(s): INR, PROTIME in the last 168 hours. Cardiac Enzymes: Recent Labs  Lab 09/26/17 2207  TROPONINI <0.03   BNP (last 3 results) No results for input(s): PROBNP in the last 8760 hours. HbA1C: No results for input(s): HGBA1C in the last 72 hours. CBG: Recent Labs  Lab 09/24/17 1130 09/24/17 1642 09/24/17 2208 09/25/17 0200 09/25/17 0737  GLUCAP 148* 152* 172* 225* 325*   Lipid Profile: No results for input(s): CHOL, HDL, LDLCALC, TRIG, CHOLHDL, LDLDIRECT in the last 72 hours. Thyroid Function Tests: No results for input(s): TSH, T4TOTAL, FREET4, T3FREE, THYROIDAB in the last 72 hours. Anemia Panel: No results for input(s): VITAMINB12, FOLATE, FERRITIN, TIBC, IRON, RETICCTPCT in the last 72 hours.  --------------------------------------------------------------------------------------------------------------- Urine analysis:    Component Value Date/Time   COLORURINE YELLOW 09/26/2017 2231   APPEARANCEUR CLEAR 09/26/2017 2231   LABSPEC 1.014 09/26/2017 2231   PHURINE 6.0 09/26/2017 2231   GLUCOSEU NEGATIVE 09/26/2017 2231   HGBUR SMALL (A) 09/26/2017 2231  BILIRUBINUR NEGATIVE 09/26/2017 2231   KETONESUR 20 (A) 09/26/2017 2231   PROTEINUR NEGATIVE  09/26/2017 2231   UROBILINOGEN 0.2 07/06/2009 1116   NITRITE NEGATIVE 09/26/2017 2231   LEUKOCYTESUR TRACE (A) 09/26/2017 2231      Imaging Results:    Ct Abdomen Pelvis W Contrast  Result Date: 09/27/2017 CLINICAL DATA:  77 y/o  F; lower abdominal pain. EXAM: CT ABDOMEN AND PELVIS WITH CONTRAST TECHNIQUE: Multidetector CT imaging of the abdomen and pelvis was performed using the standard protocol following bolus administration of intravenous contrast. CONTRAST:  183mL ISOVUE-300 IOPAMIDOL (ISOVUE-300) INJECTION 61% COMPARISON:  09/23/2017 CT abdomen and pelvis FINDINGS: Lower chest: No acute abnormality. Hepatobiliary: No focal liver abnormality is seen. No gallstones, gallbladder wall thickening, or biliary dilatation. Pancreas: Unremarkable. No pancreatic ductal dilatation or surrounding inflammatory changes. Spleen: Normal in size without focal abnormality. Adrenals/Urinary Tract: Adrenal glands are unremarkable. Kidneys are normal, without renal calculi, focal lesion, or hydronephrosis. Bladder is unremarkable. Stomach/Bowel: Acute perforated diverticulitis (series 5, image 49) with oblong fluid collection extending between the bladder and the uterus measuring 7.1 cm in length and 1.8 cm in diameter (series 2, image 67 and series 5, image 45), increased in size in comparison with the prior CT of the abdomen and pelvis. Contrast and debris is present within the perforated collection. Stomach and small bowel are unremarkable. Vascular/Lymphatic: Aortic atherosclerosis. No enlarged abdominal or pelvic lymph nodes. Reproductive: Uterus and bilateral adnexa are unremarkable. Other: No abdominal wall hernia or abnormality. No abdominopelvic ascites. Musculoskeletal: No acute or significant osseous findings. IMPRESSION: Acute perforated diverticulitis. Pericolonic abscess extending between the bladder and uterus has increased in size and surrounding inflammation. Contrast and debris extends into the abscess  cavity. Electronically Signed   By: Kristine Garbe M.D.   On: 09/27/2017 00:34    My personal review of EKG: Rhythm NSR   Assessment & Plan:    Active Problems:   Colonic diverticular abscess   1. Diverticular abscess/perforation-CT scan showed acute perforated diverticulitis, general surgery consulted by ED physician.  Dr. Arnoldo Morale recommended patient be admitted on IV antibiotics and he would see the patient in a.m.  Will start Cipro and Flagyl IV.  Continue normal saline at 100 ml/hr. 2. Diabetes mellitus-we will start sliding scale insulin NovoLog.  Check CBG every 6 hours. 3. Atrial fibrillation-patient is on flecainide at home, will hold Flecainide at this time. Will monitor.   DVT Prophylaxis-   Lovenox   AM Labs Ordered, also please review Full Orders  Family Communication: Admission, patients condition and plan of care including tests being ordered have been discussed with the patient  who indicate understanding and agree with the plan and Code Status.  Code Status: Full code  Admission status: Inpatient  Time spent in minutes : 60 minutes   Oswald Hillock M.D on 09/27/2017 at 4:18 AM  Between 7am to 7pm - Pager - [phone]. After 7pm go to [email or web address] - password [facility]  [facility] - Office  [phone]

## 2017-09-27 NOTE — ED Notes (Signed)
Pt's O2 sats dropped after IV dilaudid given. Sats to 77%. Placed pt on O2 at 2l Nasal cannula and sats up to 97%.

## 2017-09-27 NOTE — Progress Notes (Signed)
Patient seen and examined, database reviewed.  Discussed with daughter at bedside.  Patient was recently discharged after an episode of acute diverticulitis; at that time she had symptomatically improved and was tolerating diet.  Returns 3 days later with increased abdominal pain.  CT scan now shows acute perforated diverticulitis with an enlarging abscess formation.  She was seen in consultation by our general surgeon Dr. Arnoldo Morale, who sent a consultation to interventional radiology for percutaneous drainage.  IR believes that there is no safe accessible window for percutaneous drainage.  Plan for now is to continue antibiotics, will switch Cipro Flagyl over to meropenem.  She has been tentatively scheduled for partial colectomy with possible colostomy on 5/10 as discussed by surgery with patient's family.  We will continue to monitor.  Domingo Mend, MD [facility] Pager: [phone]

## 2017-09-27 NOTE — Progress Notes (Signed)
Patient ID: Barbara [name], female   DOB: [birth year], 77 y.o.   MRN: [id number] Request received for image guided pelvic abscess drainage on pt. Latest CT was reviewed by Drs. Shick and [place] and they feel there is no safe accessible window for percutaneous drainage of collection.

## 2017-09-27 NOTE — Progress Notes (Signed)
Pharmacy Antibiotic Note  Barbara [name] is a 77 y.o. female admitted on 09/26/2017 with intra-abdominal infection.  Pharmacy has been consulted for MEROPENEM dosing. Penicillin allergy noted  Plan: Meropenem 1gm IV q12hrs Monitor labs, progress, c/s  Height: 5\' 3"  (160 cm) Weight: 142 lb 13.7 oz (64.8 kg) IBW/kg (Calculated) : 52.4  Temp (24hrs), Avg:99.4 F (37.4 C), Min:98.7 F (37.1 C), Max:99.8 F (37.7 C)  Recent Labs  Lab 09/23/17 1852 09/24/17 0632 09/26/17 2207 09/27/17 0029 09/27/17 0609  WBC 8.7 9.1 14.1*  --   --   CREATININE 0.79 0.90 0.98  --  0.95  LATICACIDVEN  --   --  1.5 1.0  --     Estimated Creatinine Clearance: 47.1 mL/min (by C-G formula based on SCr of 0.95 mg/dL).    Antimicrobials this admission: Cipro and Flagyl 5/7 >> 5/7 Meropenem 5/7 >>   Dose adjustments this admission:  Microbiology results: 5/6 UCx: pending 5/7 MRSA PCR: pending  Thank you for allowing pharmacy to be a part of this patient's care.  Hart Robinsons A 09/27/2017 10:33 AM

## 2017-09-27 NOTE — Progress Notes (Signed)
Hypoglycemic Event  CBG: 59  Treatment: half amp D50 administered. Pt NPO   Symptoms: none   Follow-up CBG: Time: 1808 CBG Result:133  Possible Reasons for Event: pt NPO   Comments/MD notified: Dr. Oretha Ellis Francisco Capuchin

## 2017-09-27 NOTE — Consult Note (Signed)
Reason for Consult: Perforated sigmoid diverticulitis Referring Physician: Dr. Aviva Signs Barbara [name] is an 77 y.o. female.  HPI: Patient is a 77 year old white female with a known history of perforated sigmoid diverticulitis who was discharged yesterday on antibiotics and Percocet.  She was doing okay until yesterday evening when she had worsening abdominal pain and nausea which could not be controlled with pain medications.  She presented back to the emergency room and a repeat CT scan showed a larger abscess, now with contrast within the abscess cavity.  Her white blood cell count was elevated at 14,000.  Her blood pressure was slightly low and responded to IV hydration.  She was admitted to the intensive care unit for further monitoring.  She is also been having multiple bowel movements.  Her pain is 8 out of 10 when she moves around.  Past Medical History:  Diagnosis Date  . Arteriosclerotic cardiovascular disease (ASCVD)    non-obstructive; negative stress nuclear and normal echo in 08/2009 by [facility] cardiac [facility] in 03/2000-20%  main 20% LAD, 30% circumflex, 30% RCA, hyperdynamic LV  . Asthma    Mild  . Chest pain   . Chronic back pain   . Diabetes mellitus    A1c of 7.6 in 04/2010; moderate to high dose insulin  . GERD (gastroesophageal reflux disease)   . Hyperlipidemia    total cholesterol of 271 and LDL of 187 in 07/2010  . Hypertension   . Macular degeneration   . Palpitations   . Stroke [facility]) 1993 2008   short term memory loss  . Tobacco abuse     Past Surgical History:  Procedure Laterality Date  . APPENDECTOMY    . CATARACT EXTRACTION     left  . COLONOSCOPY  2009  . COLONOSCOPY  03/30/2012   Procedure: COLONOSCOPY;  Surgeon: Rogene Houston, MD;  Location: [facility];  Service: Endoscopy;  Laterality: N/A;  730  . COLONOSCOPY N/A 12/13/2013   Procedure: COLONOSCOPY;  Surgeon: Rogene Houston, MD;  Location: [facility];  Service: Endoscopy;   Laterality: N/A;  1200  . ESOPHAGOGASTRODUODENOSCOPY  10/21/2011   Procedure: ESOPHAGOGASTRODUODENOSCOPY (EGD);  Surgeon: Rogene Houston, MD;  Location: [facility];  Service: Endoscopy;  Laterality: N/A;  1200  . ESOPHAGOGASTRODUODENOSCOPY N/A 12/13/2013   Procedure: ESOPHAGOGASTRODUODENOSCOPY (EGD);  Surgeon: Rogene Houston, MD;  Location: [facility];  Service: Endoscopy;  Laterality: N/A;  . TUBAL LIGATION      Family History  Problem Relation Age of Onset  . Heart attack Mother   . Cancer Father   . Cancer Sister   . Heart attack Brother   . Cancer Brother   . Cancer Sister   . Cancer Sister     Social History:  reports that she has been smoking cigarettes.  She has a 3.75 pack-year smoking history. She has never used smokeless tobacco. She reports that she does not drink alcohol or use drugs.  Allergies:  Bee venom, PCN, sulfa  Medications: I have reviewed the patient's current medications.  Results for orders placed or performed during the hospital encounter of 09/26/17 (from the past 48 hour(s))  Comprehensive metabolic panel     Status: Abnormal   Collection Time: 09/26/17 10:07 PM  Result Value Ref Range   Sodium 134 (L) 135 - 145 mmol/L   Potassium 3.9 3.5 - 5.1 mmol/L   Chloride 96 (L) 101 - 111 mmol/L   CO2 24 22 - 32 mmol/L  Glucose, Bld 74 65 - 99 mg/dL   BUN 11 6 - 20 mg/dL   Creatinine, Ser 0.98 0.44 - 1.00 mg/dL   Calcium 9.0 8.9 - 10.3 mg/dL   Total Protein 6.9 6.5 - 8.1 g/dL   Albumin 3.5 3.5 - 5.0 g/dL   AST 27 15 - 41 U/L   ALT 19 14 - 54 U/L   Alkaline Phosphatase 100 38 - 126 U/L   Total Bilirubin 0.7 0.3 - 1.2 mg/dL   GFR calc non Af Amer 55 (L) >60 mL/min   GFR calc Af Amer >60 >60 mL/min    Comment: (NOTE) The eGFR has been calculated using the CKD EPI equation. This calculation has not been validated in all clinical situations. eGFR's persistently <60 mL/min signify possible Chronic Kidney Disease.    Anion gap 14 5 - 15     Comment: Performed at [facility], [address], [place], [place] [address]  Lipase, blood     Status: None   Collection Time: 09/26/17 10:07 PM  Result Value Ref Range   Lipase 20 11 - 51 U/L    Comment: Performed at [facility], [address] [place], [place] [address]  Troponin I     Status: None   Collection Time: 09/26/17 10:07 PM  Result Value Ref Range   Troponin I <0.03 <0.03 ng/mL    Comment: Performed at [facility], [address], [place], [place] [address]  Lactic acid, plasma     Status: None   Collection Time: 09/26/17 10:07 PM  Result Value Ref Range   Lactic Acid, Venous 1.5 0.5 - 1.9 mmol/L    Comment: Performed at [facility], [address] [place], [place] [address]  CBC with Differential     Status: Abnormal   Collection Time: 09/26/17 10:07 PM  Result Value Ref Range   WBC 14.1 (H) 4.0 - 10.5 K/uL   RBC 3.88 3.87 - 5.11 MIL/uL   Hemoglobin 11.3 (L) 12.0 - 15.0 g/dL   HCT 35.1 (L) 36.0 - 46.0 %   MCV 90.5 78.0 - 100.0 fL   MCH 29.1 26.0 - 34.0 pg   MCHC 32.2 30.0 - 36.0 g/dL   RDW 13.0 11.5 - 15.5 %   Platelets 346 150 - 400 K/uL   Neutrophils Relative % 82 %   Neutro Abs 11.4 (H) 1.7 - 7.7 K/uL   Lymphocytes Relative 8 %   Lymphs Abs 1.1 0.7 - 4.0 K/uL   Monocytes Relative 9 %   Monocytes Absolute 1.3 (H) 0.1 - 1.0 K/uL   Eosinophils Relative 1 %   Eosinophils Absolute 0.2 0.0 - 0.7 K/uL   Basophils Relative 0 %   Basophils Absolute 0.0 0.0 - 0.1 K/uL    Comment: Performed at [facility], [address], [place], [place] [address]  Urinalysis, Routine w reflex microscopic     Status: Abnormal   Collection Time: 09/26/17 10:31 PM  Result Value Ref Range   Color, Urine YELLOW YELLOW   APPearance CLEAR CLEAR   Specific Gravity, Urine 1.014 1.005 - 1.030   pH 6.0 5.0 - 8.0   Glucose, UA NEGATIVE NEGATIVE mg/dL   Hgb urine dipstick SMALL (A) NEGATIVE   Bilirubin Urine NEGATIVE NEGATIVE   Ketones, ur 20 (A) NEGATIVE mg/dL   Protein, ur NEGATIVE  NEGATIVE mg/dL   Nitrite NEGATIVE NEGATIVE   Leukocytes, UA TRACE (A) NEGATIVE   RBC / HPF 11-20 0 - 5 RBC/hpf   WBC, UA  0-5 0 - 5 WBC/hpf   Bacteria, UA NONE SEEN NONE SEEN   Squamous Epithelial / LPF 0-5 0 - 5    Comment: Please note change in reference range. Performed at [facility], [address] [place], [place] [address]   Lactic acid, plasma     Status: None   Collection Time: 09/27/17 12:29 AM  Result Value Ref Range   Lactic Acid, Venous 1.0 0.5 - 1.9 mmol/L    Comment: Performed at [facility], [address], [place], [place] [address]  Comprehensive metabolic panel     Status: Abnormal   Collection Time: 09/27/17  6:09 AM  Result Value Ref Range   Sodium 135 135 - 145 mmol/L   Potassium 4.4 3.5 - 5.1 mmol/L   Chloride 100 (L) 101 - 111 mmol/L   CO2 25 22 - 32 mmol/L   Glucose, Bld 183 (H) 65 - 99 mg/dL   BUN 8 6 - 20 mg/dL   Creatinine, Ser 0.95 0.44 - 1.00 mg/dL   Calcium 8.1 (L) 8.9 - 10.3 mg/dL   Total Protein 5.9 (L) 6.5 - 8.1 g/dL   Albumin 2.9 (L) 3.5 - 5.0 g/dL   AST 24 15 - 41 U/L   ALT 16 14 - 54 U/L   Alkaline Phosphatase 87 38 - 126 U/L   Total Bilirubin 0.7 0.3 - 1.2 mg/dL   GFR calc non Af Amer 58 (L) >60 mL/min   GFR calc Af Amer >60 >60 mL/min    Comment: (NOTE) The eGFR has been calculated using the CKD EPI equation. This calculation has not been validated in all clinical situations. eGFR's persistently <60 mL/min signify possible Chronic Kidney Disease.    Anion gap 10 5 - 15    Comment: Performed at [facility], [address], [place], [place] [address]  Glucose, capillary     Status: Abnormal   Collection Time: 09/27/17  6:18 AM  Result Value Ref Range   Glucose-Capillary 192 (H) 65 - 99 mg/dL   Comment 1 Notify RN   Protime-INR     Status: None   Collection Time: 09/27/17  6:58 AM  Result Value Ref Range   Prothrombin Time 13.4 11.4 - 15.2 seconds   INR 1.03     Comment: Performed at [facility], [address], [place],  [place] [address]    Ct Abdomen Pelvis W Contrast  Result Date: 09/27/2017 CLINICAL DATA:  77 y/o  F; lower abdominal pain. EXAM: CT ABDOMEN AND PELVIS WITH CONTRAST TECHNIQUE: Multidetector CT imaging of the abdomen and pelvis was performed using the standard protocol following bolus administration of intravenous contrast. CONTRAST:  113m ISOVUE-300 IOPAMIDOL (ISOVUE-300) INJECTION 61% COMPARISON:  09/23/2017 CT abdomen and pelvis FINDINGS: Lower chest: No acute abnormality. Hepatobiliary: No focal liver abnormality is seen. No gallstones, gallbladder wall thickening, or biliary dilatation. Pancreas: Unremarkable. No pancreatic ductal dilatation or surrounding inflammatory changes. Spleen: Normal in size without focal abnormality. Adrenals/Urinary Tract: Adrenal glands are unremarkable. Kidneys are normal, without renal calculi, focal lesion, or hydronephrosis. Bladder is unremarkable. Stomach/Bowel: Acute perforated diverticulitis (series 5, image 49) with oblong fluid collection extending between the bladder and the uterus measuring 7.1 cm in length and 1.8 cm in diameter (series 2, image 67 and series 5, image 45), increased in size in comparison with the prior CT of the abdomen and pelvis. Contrast and debris is present within the perforated collection. Stomach and small bowel are unremarkable. Vascular/Lymphatic: Aortic atherosclerosis. No enlarged abdominal or  pelvic lymph nodes. Reproductive: Uterus and bilateral adnexa are unremarkable. Other: No abdominal wall hernia or abnormality. No abdominopelvic ascites. Musculoskeletal: No acute or significant osseous findings. IMPRESSION: Acute perforated diverticulitis. Pericolonic abscess extending between the bladder and uterus has increased in size and surrounding inflammation. Contrast and debris extends into the abscess cavity. Electronically Signed   By: Kristine Garbe M.D.   On: 09/27/2017 00:34    ROS:  Pertinent items are noted in HPI.  Blood  pressure (!) 139/52, pulse 81, temperature 99.7 F (37.6 C), temperature source Oral, resp. rate 20, height 5' 3" (1.6 m), weight 142 lb 13.7 oz (64.8 kg), SpO2 93 %. Physical Exam: Well-developed well-nourished white female who is lying comfortably in the bed in no acute distress Head is normocephalic, atraumatic Lungs clear to auscultation with equal breath sounds bilaterally Heart examination reveals regular rate and rhythm without S3, S4, murmurs The abdomen is soft with tenderness noted in the suprapubic and left lower quadrant regions.  No rigidity is noted.  Bowel sounds are present.  CT scan images personally reviewed  Assessment/Plan: Impression: Ongoing sigmoid diverticulitis with contained perforation, now larger than seen on previous CAT scan Plan: Patient will need IR drainage of the abscess.  An order has been placed.  No need for acute surgical intervention at this time.  Will follow with you.  Aviva Signs 09/27/2017, 7:45 AM

## 2017-09-27 NOTE — Progress Notes (Addendum)
Inpatient Diabetes Program Recommendations  AACE/ADA: New Consensus Statement on Inpatient Glycemic Control (2019)  Target Ranges:  Prepandial:   less than 140 mg/dL      Peak postprandial:   less than 180 mg/dL (1-2 hours)      Critically ill patients:  140 - 180 mg/dL   Results for GARI, TROVATO (MRN [id number]) as of 09/27/2017 07:54  Ref. Range 09/27/2017 06:18  Glucose-Capillary Latest Ref Range: 65 - 99 mg/dL 192 (H)   Review of Glycemic Control  Diabetes history: DM2 Outpatient Diabetes medications: Lantus 20 units QAM, Novolin R 2-10 units TID Current orders for Inpatient glycemic control: Novolog 0-9 units TID with meals  Inpatient Diabetes Program Recommendations: Insulin - Basal: Please consider ordering Lantus 6 units Q24H starting now (based on 64 kg x 0.1 units). Correction (SSI): If patient will remain NPO, please consider changing frequency of CBGs and Novolog to Q4H. If diet ordered, please consider adding Novolog 0-5 units QHS. A1C: In process.  Thanks, Barnie Alderman, RN, MSN, CDE Diabetes Coordinator Inpatient Diabetes Program [phone] (Team Pager from 8am to 5pm)

## 2017-09-27 NOTE — Progress Notes (Signed)
IR cannot safely perform percutaneous drainage.  Thus, we will continue IV antibiotics.  Hopefully this will mature the abscess cavity.  Patient has been temporarily scheduled for partial colectomy with possible colostomy on 09/30/2017.  Patient and family aware.

## 2017-09-28 ENCOUNTER — Encounter (HOSPITAL_COMMUNITY): Payer: Self-pay | Admitting: Anesthesiology

## 2017-09-28 ENCOUNTER — Inpatient Hospital Stay (HOSPITAL_COMMUNITY): Payer: Medicare Other

## 2017-09-28 ENCOUNTER — Encounter (HOSPITAL_COMMUNITY)
Admission: EM | Disposition: A | Discharge status: Home / Self Care | Payer: Self-pay | Source: Home / Self Care | Attending: Internal Medicine

## 2017-09-28 ENCOUNTER — Telehealth: Payer: Self-pay | Admitting: Internal Medicine

## 2017-09-28 ENCOUNTER — Other Ambulatory Visit (HOSPITAL_COMMUNITY): Payer: Medicare Other

## 2017-09-28 DIAGNOSIS — I472 Ventricular tachycardia: Secondary | ICD-10-CM

## 2017-09-28 DIAGNOSIS — I340 Nonrheumatic mitral (valve) insufficiency: Secondary | ICD-10-CM

## 2017-09-28 DIAGNOSIS — K5720 Diverticulitis of large intestine with perforation and abscess without bleeding: Secondary | ICD-10-CM

## 2017-09-28 LAB — BASIC METABOLIC PANEL
ANION GAP: 11 (ref 5–15)
BUN: 7 mg/dL (ref 6–20)
CALCIUM: 8.1 mg/dL — AB (ref 8.9–10.3)
CO2: 22 mmol/L (ref 22–32)
Chloride: 103 mmol/L (ref 101–111)
Creatinine, Ser: 0.86 mg/dL (ref 0.44–1.00)
GFR calc Af Amer: >60 mL/min (ref >60)
GFR calc non Af Amer: >60 mL/min (ref >60)
Glucose, Bld: 136 mg/dL — ABNORMAL HIGH (ref 65–99)
Potassium: 4.2 mmol/L (ref 3.5–5.1)
Sodium: 136 mmol/L (ref 135–145)

## 2017-09-28 LAB — GLUCOSE, CAPILLARY
GLUCOSE-CAPILLARY: 135 mg/dL — AB (ref 65–99)
GLUCOSE-CAPILLARY: 183 mg/dL — AB (ref 65–99)
GLUCOSE-CAPILLARY: 195 mg/dL — AB (ref 65–99)
Glucose-Capillary: 120 mg/dL — ABNORMAL HIGH (ref 65–99)
Glucose-Capillary: 142 mg/dL — ABNORMAL HIGH (ref 65–99)
Glucose-Capillary: 152 mg/dL — ABNORMAL HIGH (ref 65–99)

## 2017-09-28 LAB — URINE CULTURE: Culture: NO GROWTH

## 2017-09-28 LAB — CBC
HCT: 34.3 % — ABNORMAL LOW (ref 36.0–46.0)
HEMOGLOBIN: 10.7 g/dL — AB (ref 12.0–15.0)
MCH: 29.6 pg (ref 26.0–34.0)
MCHC: 31.2 g/dL (ref 30.0–36.0)
MCV: 95 fL (ref 78.0–100.0)
Platelets: 343 10*3/uL (ref 150–400)
RBC: 3.61 MIL/uL — AB (ref 3.87–5.11)
RDW: 13.5 % (ref 11.5–15.5)
WBC: 18.8 10*3/uL — ABNORMAL HIGH (ref 4.0–10.5)

## 2017-09-28 LAB — TROPONIN I
TROPONIN I: 0.03 ng/mL — AB (ref <0.03)
TROPONIN I: 0.36 ng/mL — AB (ref <0.03)

## 2017-09-28 LAB — ECHOCARDIOGRAM COMPLETE
Height: 63 in
Weight: 2335.11 oz

## 2017-09-28 LAB — TSH: TSH: 7.417 u[IU]/mL — AB (ref 0.350–4.500)

## 2017-09-28 LAB — MRSA PCR SCREENING: MRSA by PCR: NEGATIVE

## 2017-09-28 LAB — MAGNESIUM: Magnesium: 1.8 mg/dL (ref 1.7–2.4)

## 2017-09-28 SURGERY — COLECTOMY, WITH COLOSTOMY CREATION
Anesthesia: General

## 2017-09-28 MED ORDER — LORAZEPAM 2 MG/ML IJ SOLN
1.0000 mg | INTRAMUSCULAR | Status: DC | PRN
  Administered 2017-09-28 – 2017-09-29 (×2): 1 mg via INTRAVENOUS
  Filled 2017-09-28 (×2): qty 1

## 2017-09-28 MED ORDER — SODIUM CHLORIDE 0.9 % IV SOLN: Freq: Once | INTRAVENOUS | Status: DC

## 2017-09-28 MED ORDER — AMIODARONE LOAD VIA INFUSION
150.0000 mg | Freq: Once | INTRAVENOUS | Status: AC
  Administered 2017-09-28: 150 mg via INTRAVENOUS
  Filled 2017-09-28: qty 83.34

## 2017-09-28 MED ORDER — SODIUM CHLORIDE 0.9% FLUSH
10.0000 mL | INTRAVENOUS | Status: DC | PRN
  Administered 2017-10-07: 20 mL
  Filled 2017-09-28: qty 40

## 2017-09-28 MED ORDER — MIDAZOLAM HCL 2 MG/2ML IJ SOLN
INTRAMUSCULAR | Status: AC
Start: 2017-09-28 — End: 2017-09-28
  Administered 2017-09-28: 2 mg
  Filled 2017-09-28: qty 2

## 2017-09-28 MED ORDER — CHLORHEXIDINE GLUCONATE CLOTH 2 % EX PADS
6.0000 | MEDICATED_PAD | Freq: Every day | CUTANEOUS | Status: DC
  Administered 2017-09-28 – 2017-10-09 (×12): 6 via TOPICAL

## 2017-09-28 MED ORDER — MAGNESIUM SULFATE 2 GM/50ML IV SOLN
2.0000 g | Freq: Once | INTRAVENOUS | Status: AC
  Administered 2017-09-28: 2 g via INTRAVENOUS
  Filled 2017-09-28: qty 50

## 2017-09-28 MED ORDER — AMIODARONE HCL IN DEXTROSE 360-4.14 MG/200ML-% IV SOLN
60.0000 mg/h | INTRAVENOUS | Status: AC
  Administered 2017-09-28: 60 mg/h via INTRAVENOUS
  Filled 2017-09-28: qty 200

## 2017-09-28 MED ORDER — AMIODARONE HCL IN DEXTROSE 360-4.14 MG/200ML-% IV SOLN
30.0000 mg/h | INTRAVENOUS | Status: DC
  Administered 2017-09-28: 30 mg/h via INTRAVENOUS
  Administered 2017-09-29: 17:00:00 via INTRAVENOUS
  Administered 2017-09-29 – 2017-10-03 (×8): 30 mg/h via INTRAVENOUS
  Filled 2017-09-28 (×19): qty 200

## 2017-09-28 MED ORDER — AMIODARONE HCL IN DEXTROSE 360-4.14 MG/200ML-% IV SOLN
INTRAVENOUS | Status: AC
  Administered 2017-09-28: 150 mg via INTRAVENOUS
  Filled 2017-09-28: qty 200

## 2017-09-28 MED ORDER — SODIUM CHLORIDE 0.9% FLUSH
10.0000 mL | Freq: Two times a day (BID) | INTRAVENOUS | Status: DC
  Administered 2017-09-28: 30 mL
  Administered 2017-09-28 – 2017-09-29 (×2): 10 mL

## 2017-09-28 MED ORDER — ADENOSINE 6 MG/2ML IV SOLN
INTRAVENOUS | Status: AC
  Filled 2017-09-28: qty 6

## 2017-09-28 MED ORDER — ORAL CARE MOUTH RINSE
15.0000 mL | Freq: Two times a day (BID) | OROMUCOSAL | Status: DC
  Administered 2017-09-29 – 2017-10-01 (×5): 15 mL via OROMUCOSAL

## 2017-09-28 NOTE — Progress Notes (Signed)
Full consult to follow. Patient seen emergently after developing wide complex tachycardia to 200s. She did not lose pulse, bp's down to 80. Due to instability she was cardioverted with a single synchronized 200J shock to SR. Sedated prior with 0.5mg  of versed and had previously received dilaudid for abdominal pain. Admitted with abdominal pain, sigmoid diverticulitis with contained perforation.    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 Prior GI bleeding on anticoagulation. Has been on flecanide.  Echo 2015 LVEF 60-65%, no WMAs Labs this AM: K 4.3, Mg and TSH pending.  Post conversion EKG SR, normal QTc, nonspecific ST/T changes  Contiue amio drip, cycle cardiac enzymes, obtain echo, f/u Mg and TSH. Will likely require transfer for EP evaluation.   Carlyle Dolly MD

## 2017-09-28 NOTE — Progress Notes (Signed)
Dr. Harl Bowie of cardiology called me and has requested that the patient be transferred to [facility] for further management and treatment of her SVT.  He does not recommend having surgery here at [facility].  I did discuss this with the patient and family.  She does not have peritoneal signs, but I feel that she will need a partial colectomy with colostomy due to her ongoing perforated sigmoid colon diverticulitis with contained perforated abscess.

## 2017-09-28 NOTE — Telephone Encounter (Signed)
Spoke with pt's daughter. She is extremely worried about her mothers current condition with WCT episode and abdominal abscess. She was under the impression her mother needed a heart cath. I told her I did not read that in any notes and that Dr Harl Bowie wanted her tx for extra precaution.  Pt has orders to be transferred to [facility] but the transfer has not been initiated yet. Pt cannot have her abd surgery until she reaches [facility]. I explained to her that facility transfers are out of our control. However, I did call the covering [facility] PA and confirmed that bed placement was not within our control. She suggested the pt's attending have a discussion with her [facility] admitting hospital to hasten the transfer. I called pt's daughter back and left her a VM with that recommendation.

## 2017-09-28 NOTE — Progress Notes (Signed)
*  PRELIMINARY RESULTS* Echocardiogram 2D Echocardiogram has been performed.  Barbara [name] 09/28/2017, 10:27 AM

## 2017-09-28 NOTE — Progress Notes (Signed)
Patient began screaming in pain around 0815 with HR increasing.  MD was paged.  At [place] patient went into VT 200's.  Dilaudid 1mg  IV given at 0820 at that time BP was 87/65 and sats were 88%.  Dr. Manuella Ghazi and Dr. Harl Bowie at bedside.  Versed 0.5mg  IV given for sedation.  At 0823 BP was 123/76 HR 189 and sats 86%.  Synchronized  Cardioversion done at 200 J at 0825.  Amiodarone bolus given at 0832 and amiodarone drip started.  Patient back in SR.  12 Lead EKG done.  Patient resting quietly with no signs of distress at this time.  Will continue to monitor closely.

## 2017-09-28 NOTE — Telephone Encounter (Signed)
New Message   Pt's daughter states that the pt is in the icu with an abscess in bowel, she says pt had a cardiac event and they had to shock her heart back into rhythm. They are trying  To get her transferred to [place] [facility] because the pt is at [facility] but no bed is available. They would like for Caryl Comes to try to get her a bed sooner so that she can have the surgery on her bowel. Please call

## 2017-09-28 NOTE — Progress Notes (Signed)
Peripherally Inserted Central Catheter/Midline Placement  The IV Nurse has discussed with the patient and/or persons authorized to consent for the patient, the purpose of this procedure and the potential benefits and risks involved with this procedure.  The benefits include less needle sticks, lab draws from the catheter, and the patient may be discharged home with the catheter. Risks include, but not limited to, infection, bleeding, blood clot (thrombus formation), and puncture of an artery; nerve damage and irregular heartbeat and possibility to perform a PICC exchange if needed/ordered by physician.  Alternatives to this procedure were also discussed.  Bard Power PICC patient education guide, fact sheet on infection prevention and patient information card has been provided to patient /or left at bedside.    PICC/Midline Placement Documentation  PICC Triple Lumen 16/10/96 PICC Right Basilic 38 cm 0 cm (Active)  Indication for Insertion or Continuance of Line Prolonged intravenous therapies 09/28/2017 11:30 AM  Exposed Catheter (cm) 0 cm 09/28/2017 11:30 AM  Site Assessment Clean;Dry;Intact 09/28/2017 11:30 AM  Lumen #1 Status Flushed;Saline locked;Blood return noted 09/28/2017 11:30 AM  Lumen #2 Status Flushed;Saline locked;Blood return noted 09/28/2017 11:30 AM  Lumen #3 Status Flushed;Saline locked;Blood return noted 09/28/2017 11:30 AM  Dressing Type Transparent;Securing device 09/28/2017 11:30 AM  Dressing Status Clean;Dry;Intact;Antimicrobial disc in place 09/28/2017 11:30 AM  Dressing Change Due 10/05/17 09/28/2017 11:30 AM       Frances Maywood 09/28/2017, 11:53 AM

## 2017-09-28 NOTE — Progress Notes (Signed)
CRITICAL VALUE ALERT  Critical Value:  Trop  Date & Time Notied:  05/08/219 at [place]  Provider Notified: Dr. Manuella Ghazi

## 2017-09-28 NOTE — Consult Note (Addendum)
Cardiology Consultation:   [name] ID: Barbara [name]; [id number]; [birth year]   Admit date: 09/26/2017 Date of Consult: 09/28/2017  Primary Care Provider: Asencion Noble, MD Primary Cardiologist: Dr Barbara [name] Primary Electrophysiologist:  Dr Barbara [name]   [name] Profile:   Barbara [name] is a 77 y.o. [name] with a hx of PAF who is being seen today for Barbara evaluation of VT at Barbara request of Dr Barbara [name].  History of Present Illness:   Barbara [name] is a 77 y/o [name] with a history of PAF. Barbara [name] had not been anticoagulated secondary to a history of GI bleeding. Barbara [name] has been maintaing NSR on Flecainide. Prior cath in 2001 showed mild non obstructive CAD. Myoview in 2015 was low risk. Echo in 2015 showed normal LVF, mild LVH, normal LA.   Barbara [name] was admitted 09/26/17 with abdominal pain and was found to have diverticulitis with an abscess. Barbara plan has been for IV antibiotics followed by surgery later this week. This morning Barbara [name] went into rapid WCT at 200. Barbara [name] B/P dropped into Barbara 80's. Barbara [name] was cardioverted with a single synchronized 200J shock to NSR. K+ is 4.3, Mg++ and TSH pending. Barbara [name] has been started on Iv Amiodarone. Barbara pt is awake but a little groggy from pain medications for Barbara [name] abdominal pain. Barbara [name] denies chest pain.    Past Medical History:  Diagnosis Date  . Arteriosclerotic cardiovascular disease (ASCVD)    non-obstructive; negative stress nuclear and normal echo in 08/[facility] in 03/2000-20%  main 20% LAD, 30% circumflex, 30% RCA, hyperdynamic LV  . Asthma    Mild  . Chest pain   . Chronic back pain   . Diabetes mellitus    A1c of 7.6 in 04/2010; moderate to high dose insulin  . GERD (gastroesophageal reflux disease)   . Hyperlipidemia    total cholesterol of 271 and LDL of 187 in 07/2010  . Hypertension   . Macular degeneration   . Palpitations   . Stroke Barbara [name]) 1993 2008   short term memory loss  . Tobacco abuse     Past Surgical History:  Procedure  Laterality Date  . APPENDECTOMY    . CATARACT EXTRACTION     left  . COLONOSCOPY  2009  . COLONOSCOPY  03/30/2012   Procedure: COLONOSCOPY;  Surgeon: Rogene Houston, MD;  [facility];  Service: Endoscopy;  Laterality: N/A;  730  . COLONOSCOPY N/A 12/13/2013   Procedure: COLONOSCOPY;  Surgeon: Rogene Houston, MD;  Location: [facility];  Service: Endoscopy;  Laterality: N/A;  1200  . ESOPHAGOGASTRODUODENOSCOPY  10/21/2011   Procedure: ESOPHAGOGASTRODUODENOSCOPY (EGD);  Surgeon: Rogene Houston, MD;  Location: [facility];  Service: Endoscopy;  Laterality: N/A;  1200  . ESOPHAGOGASTRODUODENOSCOPY N/A 12/13/2013   Procedure: ESOPHAGOGASTRODUODENOSCOPY (EGD);  Surgeon: Rogene Houston, MD;  Location: [facility];  Service: Endoscopy;  Laterality: N/A;  . TUBAL LIGATION       Home Medications:  Prior to Admission medications   Medication Sig Start Date End Date Taking? Authorizing Provider  aspirin EC 325 MG tablet Take 325 mg by mouth every evening.   Yes [provider]  B Complex Vitamins (VITAMIN B COMPLEX PO) Take 1 tablet by mouth daily.   Yes [provider]  Bevacizumab (AVASTIN) 100 MG/4ML SOLN Inject 1.25 mg into Barbara vein every 8 (eight) weeks.   Yes [provider]  brimonidine (ALPHAGAN) 0.2 % ophthalmic solution Place 1 drop into both eyes  3 (three) times daily.  01/31/15  Yes [provider]  Calcium Carbonate Antacid (TUMS E-X SUGAR FREE PO) Take 2 tablets by mouth every morning.   Yes [provider]  Cholecalciferol (VITAMIN D) 1000 UNITS capsule Take 1,000 Units by mouth every morning.    Yes [provider]  flecainide (TAMBOCOR) 100 MG tablet TAKE 1 TABLET BY MOUTH TWICE DAILY 08/19/17  Yes Deboraha Sprang, MD  insulin glargine (LANTUS) 100 UNIT/ML injection Inject 20 Units into Barbara skin every morning.  08/16/13  Yes Barbara Noble, MD  insulin regular (NOVOLIN R,HUMULIN R) 100 units/mL injection Inject 2-10 Units  into Barbara skin 3 (three) times daily as needed for high blood sugar (per sliding scale). SLIDING SCALE    Yes [provider]  lisinopril (PRINIVIL,ZESTRIL) 5 MG tablet Take 2.5 mg by mouth every evening.  09/15/17  Yes [provider]  metoprolol tartrate (LOPRESSOR) 25 MG tablet Take 25 mg by mouth daily as needed (high heart rate).  08/18/15  Yes [provider]  Multiple Vitamins-Minerals (PRESERVISION AREDS 2) CAPS Take 1 capsule by mouth 2 (two) times daily.   Yes [provider]  nitroGLYCERIN (NITROSTAT) 0.4 MG SL tablet Place 0.4 mg under Barbara tongue every 5 (five) minutes x 3 doses as needed for chest pain.  06/02/11  Yes Yehuda Savannah, MD  ondansetron (ZOFRAN ODT) 4 MG disintegrating tablet Take 1 tablet (4 mg total) by mouth every 8 (eight) hours as needed. [name] taking differently: Take 4 mg by mouth every 8 (eight) hours as needed for nausea or vomiting.  09/18/17  Yes Fredia Sorrow, MD  Polyethyl Glycol-Propyl Glycol (SYSTANE OP) Apply 1 drop to eye 2 (two) times daily.   Yes [provider]  vitamin C (ASCORBIC ACID) 500 MG tablet Take 1,000 mg by mouth at bedtime.    Yes [provider]  vitamin E 1000 UNIT capsule Take 1,000 Units by mouth at bedtime.    Yes [provider]    Inpatient Medications: Scheduled Meds: . adenosine      . brimonidine  1 drop Both Eyes TID  . enoxaparin (LOVENOX) injection  30 mg Subcutaneous Daily  . insulin aspart  0-9 Units Subcutaneous TID WC   Continuous Infusions: . sodium chloride 100 mL/hr at 09/28/17 0902  . amiodarone 60 mg/hr (09/28/17 2458)   Followed by  . amiodarone    . meropenem (MERREM) IV Stopped (09/27/17 2130)   PRN Meds: acetaminophen **OR** acetaminophen, HYDROmorphone (DILAUDID) injection, LORazepam, ondansetron **OR** ondansetron (ZOFRAN) IV  Allergies:    Allergies  Allergen Reactions  . Bee Venom Anaphylaxis  . Sulfamethoxazole Nausea And Vomiting    . Penicillins Itching and Rash      . Sulfonamide Derivatives Nausea And Vomiting    Social History:   Social History   Socioeconomic History  . Marital status: Married    Spouse name: Not on file  . Number of children: 1  . Years of education: Not on file  . Highest education level: Not on file  Occupational History  . Occupation: retired    Comment: hairdresser  Social Needs  . Financial resource strain: Not on file  . Food insecurity:    Worry: Not on file    Inability: Not on file  . Transportation needs:    Medical: Not on file    Non-medical: Not on file  Tobacco Use  . Smoking status: Current Every Day Smoker    Packs/day: 0.25  Years: 15.00    Pack years: 3.75    Types: Cigarettes  . Smokeless tobacco: Never Used  Substance and Sexual Activity  . Alcohol use: No  . Drug use: No  . Sexual activity: Not Currently  Lifestyle  . Physical activity:    Days per week: Not on file    Minutes per session: Not on file  . Stress: Not on file  Relationships  . Social connections:    Talks on phone: Not on file    Gets together: Not on file    Attends religious service: Not on file    Active member of club or organization: Not on file    Attends meetings of clubs or organizations: Not on file    Relationship status: Not on file  . Intimate partner violence:    Fear of current or ex partner: Not on file    Emotionally abused: Not on file    Physically abused: Not on file    Forced sexual activity: Not on file  Other Topics Concern  . Not on file  Social History Narrative  . Not on file    Family History:   Family History  Problem Relation Age of Onset  . Heart attack Mother   . Cancer Father   . Cancer Sister   . Heart attack Brother   . Cancer Brother   . Cancer Sister   . Cancer Sister      ROS:  Please see Barbara history of present illness.  All other ROS reviewed and negative.     Physical Exam/Data:   Vitals:   09/28/17 0545 09/28/17 0600  09/28/17 0700 09/28/17 0724  BP: 117/87 (!) 141/58 127/73   Pulse: 87 89 (!) 105 93  Resp: 18 14 18 15   Temp:      TempSrc:      SpO2: 95% 95% 94% 93%  Weight:      Height:        Intake/Output Summary (Last 24 hours) at 09/28/2017 0913 Last data filed at 09/28/2017 0800 Gross per 24 hour  Intake 2400 ml  Output 450 ml  Net 1950 ml   Filed Weights   09/26/17 2127 09/27/17 0545 09/28/17 0500  Weight: 138 lb (62.6 kg) 142 lb 13.7 oz (64.8 kg) 145 lb 15.1 oz (66.2 kg)   Body mass index is 25.85 kg/m.  General:  Well nourished, well developed, in no acute distress HEENT: normal Lymph: no adenopathy Neck: no JVD Endocrine:  No thryomegaly Vascular: No carotid bruits; FA pulses 2+ bilaterally without bruits  Cardiac:  RRR soft systsolic murmur AOV, LSB Lungs:  clear to auscultation bilaterally, no wheezing, rhonchi or rales  Abd: mildly distended, BS diminnished Ext: no edema Musculoskeletal:  No deformities, BUE and BLE strength normal and equal Skin: warm and dry  Neuro:  CNs 2-12 intact, no focal abnormalities noted Psych:  Normal affect   EKG:  Barbara EKG was personally reviewed and demonstrates:  Post CV- NSR, diffuse ST depression.  QTc 483 Telemetry:  Telemetry was personally reviewed and demonstrates:  NSR currently. Strips of WCT have been saved to paper chart  Relevant CV Studies:   Laboratory Data:  Chemistry Recent Labs  Lab 09/26/17 2207 09/27/17 0609 09/28/17 0412  NA 134* 135 136  K 3.9 4.4 4.2  CL 96* 100* 103  CO2 24 25 22   GLUCOSE 74 183* 136*  BUN 11 8 7   CREATININE 0.98 0.95 0.86  CALCIUM 9.0 8.1* 8.1*  GFRNONAA 55* 58* >60  GFRAA >60 >60 >60  ANIONGAP 14 10 11     Recent Labs  Lab 09/23/17 1852 09/26/17 2207 09/27/17 0609  PROT 6.7 6.9 5.9*  ALBUMIN 3.4* 3.5 2.9*  AST 24 27 24   ALT 19 19 16   ALKPHOS 123 100 87  BILITOT 0.5 0.7 0.7   Hematology Recent Labs  Lab 09/24/17 0632 09/26/17 2207 09/28/17 0412  WBC 9.1 14.1* 18.8*  RBC  3.89 3.88 3.61*  HGB 11.8* 11.3* 10.7*  HCT 36.0 35.1* 34.3*  MCV 92.5 90.5 95.0  MCH 30.3 29.1 29.6  MCHC 32.8 32.2 31.2  RDW 13.1 13.0 13.5  PLT 286 346 343   Cardiac Enzymes Recent Labs  Lab 09/26/17 2207  TROPONINI <0.03   No results for input(s): TROPIPOC in Barbara last 168 hours.  BNPNo results for input(s): BNP, PROBNP in Barbara last 168 hours.  DDimer No results for input(s): DDIMER in Barbara last 168 hours.  Radiology/Studies:  Ct Abdomen Pelvis W Contrast  Result Date: 09/27/2017 CLINICAL DATA:  77 y/o  F; lower abdominal pain. EXAM: CT ABDOMEN AND PELVIS WITH CONTRAST TECHNIQUE: Multidetector CT imaging of Barbara abdomen and pelvis was performed using Barbara standard protocol following bolus administration of intravenous contrast. CONTRAST:  179mL ISOVUE-300 IOPAMIDOL (ISOVUE-300) INJECTION 61% COMPARISON:  09/23/2017 CT abdomen and pelvis FINDINGS: Lower chest: No acute abnormality. Hepatobiliary: No focal liver abnormality is seen. No gallstones, gallbladder wall thickening, or biliary dilatation. Pancreas: Unremarkable. No pancreatic ductal dilatation or surrounding inflammatory changes. Spleen: Normal in size without focal abnormality. Adrenals/Urinary Tract: Adrenal glands are unremarkable. Kidneys are normal, without renal calculi, focal lesion, or hydronephrosis. Bladder is unremarkable. Stomach/Bowel: Acute perforated diverticulitis (series 5, image 49) with oblong fluid collection extending between Barbara bladder and Barbara uterus measuring 7.1 cm in length and 1.8 cm in diameter (series 2, image 67 and series 5, image 45), increased in size in comparison with Barbara prior CT of Barbara abdomen and pelvis. Contrast and debris is present within Barbara perforated collection. Stomach and small bowel are unremarkable. Vascular/Lymphatic: Aortic atherosclerosis. No enlarged abdominal or pelvic lymph nodes. Reproductive: Uterus and bilateral adnexa are unremarkable. Other: No abdominal wall hernia or abnormality.  No abdominopelvic ascites. Musculoskeletal: No acute or significant osseous findings. IMPRESSION: Acute perforated diverticulitis. Pericolonic abscess extending between Barbara bladder and uterus has increased in size and surrounding inflammation. Contrast and debris extends into Barbara abscess cavity. Electronically Signed   By: Kristine Garbe M.D.   On: 09/27/2017 00:34    Assessment and Plan:   NSWCT- Shocked to NSR this am- Amiodarone IV added  H/O PAF, complex atrial tachycardia- Not on anticoagulation secondary to GI bleed in Barbara past- Maintained on Flecainide and metoprolol prior to admission-  HTN- Controlled. On ACE and beta blocker at home. Mild LVH and normal LVF on echo 2015  CAD- Mild CAD at cath 2001, Myoview low risk Oct 2015 and 2018  Diverticulitis- Barbara [name] has an abscess and now apparently perforated bowel and will need surgery ASAP.   IDDM-  Plan: Most likely has not been absorbing Barbara [name] PO medication. MD to review- consider EP evaluation when stable. We will follow peri op.   For questions or updates, please contact [place] Please consult [email or web address] for contact info under Cardiology/STEMI.   Angelena Form, PA-C  09/28/2017 9:13 AM   Attending note  [name] seen and discussed with PA Rosalyn Gess, I agree with documentation. Barbara [name] with history of afib not on anticoag  due to prior GI bleed x2 on eliquis, atach, HL, HTN, nonobstructive CAD by cath 2001 and negative stress 2011 and Jan 2018, DM2, admitted with abdominal pain. Managed for sigmoid diverticulitis with contained perforation, on abx with possible surgery. Severe lower abdominal pain this morning, shortly fllowed by episode of wide complex tachycardia to 200 this morning. Barbara [name] did not lose pulse or become unresponsive, SBP down to 70s-80s, Barbara [name] was urgently cardioverted with a single synchronized 200j shock to SR. BP stabilized, started on amiodarone with 150mg  bolus followed by drip at 60mg /hr.  Postconversion EKG without clear ischemic changes. K 4.2, Mg 1.8 Trop 0.03. Echo pending. Further recs pending initial workup.   Echo reviewed at bedside, LVEF hyperdynamic 65-70%, no new WMAs.  Spoke with Dr Arnoldo Morale and primary, my recs are for transfer to [facility] for EP evaluation prior to surgery.    TELE STRIPS PLACED IN PAPER CHART  Carlyle Dolly MD

## 2017-09-28 NOTE — Progress Notes (Signed)
PROGRESS NOTE    Barbara [name]  EGB:[id number] DOB: 03-27-1941 DOA: 09/26/2017 PCP: Asencion Noble, MD   Brief Narrative:   Barbara [name]  is a 77 y.o. female, with a history of diabetes mellitus, hypertension who was recently admitted to the hospital with sigmoid diverticulitis and associated abscess and discharged on Cipro and Flagyl on 09/25/2017.  Her CT scan in the ED demonstrated diverticular perforation for which general surgery was consulted and recommended initiation of IV Merrem.  She was going to have partial colectomy and colostomy today, but unfortunately had an episode of wide-complex tachycardia that was unstable with systolic blood pressure in the 80 range.  She was cardioverted with a single synchronized 200 J shock to sinus rhythm by cardiology Dr. Harl Bowie.  She was subsequently initiated on amiodarone drip with recommendation to hold off operative intervention with further EP evaluation at [facility].   Assessment & Plan:   Active Problems:   Diverticulitis of both large and small intestine with perforation and abscess without bleeding   Colonic diverticular abscess   1. Nonsustained wide-complex tachycardia.  Status post synchronized cardioversion to normal sinus rhythm. She did not lose her pulse or become unresponsive. Currently in stable condition with IV amiodarone initiated by cardiology.  Further recommendations for transfer to [facility] for EP evaluation.  Cycle cardiac enzymes, magnesium, TSH, and 2D echocardiogram with LVEF 65 to 70% and no new wall motion abnormalities.  Transfer to stepdown unit. 2. Diverticular abscess with perforation.  Continue on IV Merrem.  General surgical consultation will be necessary for further evaluation.  Maintain n.p.o. except sips with meds and continue IV fluid.  PICC line to be placed. 3. History of paroxysmal atrial fibrillation/complex atrial tachycardia.  No anticoagulation secondary to prior GI bleed.  Maintained on flecainide and  metoprolol. 4. Insulin-dependent diabetes.  Continue current insulin regimen with adequate control noted. 5. Hypertension.  Controlled with ACE inhibitor and beta-blocker at home. 6. Mild CAD.  Prior cath in 2001.   DVT prophylaxis:Lovenox Code Status: Full Family Communication: Daughter at bedside Disposition Plan: Plan to transfer to [facility] for further evaluation by EP as well as general surgery subsequently.   Consultants:   GS-Dr. Arnoldo Morale  Cardiology Dr. Harl Bowie  Procedures:   Synchronized Cardioversion 09/28/2017  Antimicrobials:   Cipro/Flagyl 5/7-5/8  Merrem 5/8->   Subjective: Patient seen and evaluated today with noted lower abdominal as well as bilateral thigh pain overnight.  She unfortunately developed a wide-complex tachycardia while she was having an episode of acute lower abdominal pain that required cardioversion earlier this morning.  Objective: Vitals:   09/28/17 0545 09/28/17 0600 09/28/17 0700 09/28/17 0724  BP: 117/87 (!) 141/58 127/73   Pulse: 87 89 (!) 105 93  Resp: 18 14 18 15   Temp:      TempSrc:      SpO2: 95% 95% 94% 93%  Weight:      Height:        Intake/Output Summary (Last 24 hours) at 09/28/2017 1028 Last data filed at 09/28/2017 0800 Gross per 24 hour  Intake 2300 ml  Output 450 ml  Net 1850 ml   Filed Weights   09/26/17 2127 09/27/17 0545 09/28/17 0500  Weight: 62.6 kg (138 lb) 64.8 kg (142 lb 13.7 oz) 66.2 kg (145 lb 15.1 oz)    Examination:  General exam: Appears calm and comfortable  Respiratory system: Clear to auscultation. Respiratory effort normal. On [place]. Cardiovascular system: S1 & S2 heard, RRR. No JVD,  murmurs, rubs, gallops or clicks. No pedal edema. Gastrointestinal system: Abdomen is nondistended, soft and moderately tender to palpation. No organomegaly or masses felt. Normal bowel sounds heard. Central nervous system: Alert and oriented. No focal neurological deficits. Extremities: Symmetric 5 x 5  power. Skin: No rashes, lesions or ulcers    Data Reviewed: I have personally reviewed following labs and imaging studies  CBC: Recent Labs  Lab 09/23/17 1852 09/24/17 0632 09/26/17 2207 09/28/17 0412  WBC 8.7 9.1 14.1* 18.8*  NEUTROABS 5.5  --  11.4*  --   HGB 11.4* 11.8* 11.3* 10.7*  HCT 34.6* 36.0 35.1* 34.3*  MCV 91.8 92.5 90.5 95.0  PLT 290 286 346 195   Basic Metabolic Panel: Recent Labs  Lab 09/23/17 1852 09/24/17 0632 09/26/17 2207 09/27/17 0609 09/28/17 0412 09/28/17 0848  NA 136 134* 134* 135 136  --   K 3.9 4.0 3.9 4.4 4.2  --   CL 100* 99* 96* 100* 103  --   CO2 25 24 24 25 22   --   GLUCOSE 50* 105* 74 183* 136*  --   BUN 9 7 11 8 7   --   CREATININE 0.79 0.90 0.98 0.95 0.86  --   CALCIUM 8.9 8.6* 9.0 8.1* 8.1*  --   MG  --   --   --   --   --  1.8   GFR: Estimated Creatinine Clearance: 52.5 mL/min (by C-G formula based on SCr of 0.86 mg/dL). Liver Function Tests: Recent Labs  Lab 09/23/17 1852 09/26/17 2207 09/27/17 0609  AST 24 27 24   ALT 19 19 16   ALKPHOS 123 100 87  BILITOT 0.5 0.7 0.7  PROT 6.7 6.9 5.9*  ALBUMIN 3.4* 3.5 2.9*   Recent Labs  Lab 09/26/17 2207  LIPASE 20   No results for input(s): AMMONIA in the last 168 hours. Coagulation Profile: Recent Labs  Lab 09/27/17 0658  INR 1.03   Cardiac Enzymes: Recent Labs  Lab 09/26/17 2207 09/28/17 0848  TROPONINI <0.03 0.03*   BNP (last 3 results) No results for input(s): PROBNP in the last 8760 hours. HbA1C: Recent Labs    09/27/17 0609  HGBA1C 6.4*   CBG: Recent Labs  Lab 09/27/17 1807 09/27/17 2025 09/28/17 0048 09/28/17 0601 09/28/17 0740  GLUCAP 133* 118* 120* 135* 152*   Lipid Profile: No results for input(s): CHOL, HDL, LDLCALC, TRIG, CHOLHDL, LDLDIRECT in the last 72 hours. Thyroid Function Tests: Recent Labs    09/26/17 2203  TSH 7.417*   Anemia Panel: No results for input(s): VITAMINB12, FOLATE, FERRITIN, TIBC, IRON, RETICCTPCT in the last 72  hours. Sepsis Labs: Recent Labs  Lab 09/26/17 2207 09/27/17 0029  LATICACIDVEN 1.5 1.0   Recent Results (from the past 240 hour(s))  MRSA PCR Screening     Status: None   Collection Time: 09/27/17  5:48 AM  Result Value Ref Range Status   MRSA by PCR NEGATIVE NEGATIVE Final    Comment:        The GeneXpert MRSA Assay (FDA approved for NASAL specimens only), is one component of a comprehensive MRSA colonization surveillance program. It is not intended to diagnose MRSA infection nor to guide or monitor treatment for MRSA infections. Performed at [facility], [address] [place], [place] [address]        Radiology Studies: Ct Abdomen Pelvis W Contrast  Result Date: 09/27/2017 CLINICAL DATA:  78 y/o  F; lower abdominal pain. EXAM: CT ABDOMEN AND PELVIS WITH CONTRAST  TECHNIQUE: Multidetector CT imaging of the abdomen and pelvis was performed using the standard protocol following bolus administration of intravenous contrast. CONTRAST:  166mL ISOVUE-300 IOPAMIDOL (ISOVUE-300) INJECTION 61% COMPARISON:  09/23/2017 CT abdomen and pelvis FINDINGS: Lower chest: No acute abnormality. Hepatobiliary: No focal liver abnormality is seen. No gallstones, gallbladder wall thickening, or biliary dilatation. Pancreas: Unremarkable. No pancreatic ductal dilatation or surrounding inflammatory changes. Spleen: Normal in size without focal abnormality. Adrenals/Urinary Tract: Adrenal glands are unremarkable. Kidneys are normal, without renal calculi, focal lesion, or hydronephrosis. Bladder is unremarkable. Stomach/Bowel: Acute perforated diverticulitis (series 5, image 49) with oblong fluid collection extending between the bladder and the uterus measuring 7.1 cm in length and 1.8 cm in diameter (series 2, image 67 and series 5, image 45), increased in size in comparison with the prior CT of the abdomen and pelvis. Contrast and debris is present within the perforated collection. Stomach and small bowel are  unremarkable. Vascular/Lymphatic: Aortic atherosclerosis. No enlarged abdominal or pelvic lymph nodes. Reproductive: Uterus and bilateral adnexa are unremarkable. Other: No abdominal wall hernia or abnormality. No abdominopelvic ascites. Musculoskeletal: No acute or significant osseous findings. IMPRESSION: Acute perforated diverticulitis. Pericolonic abscess extending between the bladder and uterus has increased in size and surrounding inflammation. Contrast and debris extends into the abscess cavity. Electronically Signed   By: Kristine Garbe M.D.   On: 09/27/2017 00:34     Scheduled Meds: . adenosine      . brimonidine  1 drop Both Eyes TID  . enoxaparin (LOVENOX) injection  30 mg Subcutaneous Daily  . insulin aspart  0-9 Units Subcutaneous TID WC   Continuous Infusions: . sodium chloride 100 mL/hr at 09/28/17 0902  . sodium chloride    . amiodarone 60 mg/hr (09/28/17 4825)   Followed by  . amiodarone    . magnesium sulfate 1 - 4 g bolus IVPB 2 g (09/28/17 1020)  . meropenem (MERREM) IV 1 g (09/28/17 1015)     LOS: 1 day    Time spent: 30 minutes    Lateria Alderman Darleen Crocker, DO [facility] Pager [phone]  If 7PM-7AM, please contact night-coverage [email or web address] Password [facility] 09/28/2017, 10:28 AM

## 2017-09-28 NOTE — Progress Notes (Signed)
Patient had an episode of SVT, now stable on amiodarone drip.  Had worsening lower abdominal pain.  WBC elevated today.  Abdomen is soft with tenderness in suprapubic and left lower quadrant regions.  Will proceed with partial colectomy with colostomy today.  Risks and benefits of procedure including bleeding, infection, cardiopulmonary difficulties, and the need to create a colostomy were fully explained to the patient, who gives informed consent.

## 2017-09-29 ENCOUNTER — Inpatient Hospital Stay (HOSPITAL_COMMUNITY): Payer: Medicare Other | Admitting: Certified Registered"

## 2017-09-29 ENCOUNTER — Encounter (HOSPITAL_COMMUNITY)
Admission: EM | Disposition: A | Discharge status: Home / Self Care | Payer: Self-pay | Source: Home / Self Care | Attending: Internal Medicine

## 2017-09-29 ENCOUNTER — Encounter (HOSPITAL_COMMUNITY): Payer: Self-pay | Admitting: General Surgery

## 2017-09-29 ENCOUNTER — Ambulatory Visit: Payer: Medicare Other | Admitting: General Surgery

## 2017-09-29 DIAGNOSIS — G9340 Encephalopathy, unspecified: Secondary | ICD-10-CM

## 2017-09-29 DIAGNOSIS — R000 Tachycardia, unspecified: Secondary | ICD-10-CM | POA: Diagnosis present

## 2017-09-29 DIAGNOSIS — E1110 Type 2 diabetes mellitus with ketoacidosis without coma: Secondary | ICD-10-CM

## 2017-09-29 DIAGNOSIS — K5740 Diverticulitis of both small and large intestine with perforation and abscess without bleeding: Secondary | ICD-10-CM

## 2017-09-29 DIAGNOSIS — A419 Sepsis, unspecified organism: Principal | ICD-10-CM

## 2017-09-29 DIAGNOSIS — R6521 Severe sepsis with septic shock: Secondary | ICD-10-CM

## 2017-09-29 HISTORY — PX: LAPAROTOMY: SHX154

## 2017-09-29 HISTORY — PX: COLECTOMY WITH COLOSTOMY CREATION/HARTMANN PROCEDURE: SHX6598

## 2017-09-29 LAB — CBC
HCT: 34.8 % — ABNORMAL LOW (ref 36.0–46.0)
Hemoglobin: 10.7 g/dL — ABNORMAL LOW (ref 12.0–15.0)
MCH: 29.1 pg (ref 26.0–34.0)
MCHC: 30.7 g/dL (ref 30.0–36.0)
MCV: 94.6 fL (ref 78.0–100.0)
Platelets: 399 10*3/uL (ref 150–400)
RBC: 3.68 MIL/uL — ABNORMAL LOW (ref 3.87–5.11)
RDW: 13.9 % (ref 11.5–15.5)
WBC: 19.2 10*3/uL — ABNORMAL HIGH (ref 4.0–10.5)

## 2017-09-29 LAB — POCT I-STAT 7, (LYTES, BLD GAS, ICA,H+H)
ACID-BASE DEFICIT: 9 mmol/L — AB (ref 0.0–2.0)
Acid-base deficit: 5 mmol/L — ABNORMAL HIGH (ref 0.0–2.0)
Acid-base deficit: 3 mmol/L — ABNORMAL HIGH (ref 0.0–2.0)
BICARBONATE: 20.7 mmol/L (ref 20.0–28.0)
Bicarbonate: 17.2 mmol/L — ABNORMAL LOW (ref 20.0–28.0)
Bicarbonate: 22.7 mmol/L (ref 20.0–28.0)
CALCIUM ION: 1.1 mmol/L — AB (ref 1.15–1.40)
CALCIUM ION: 1.14 mmol/L — AB (ref 1.15–1.40)
Calcium, Ion: 1.06 mmol/L — ABNORMAL LOW (ref 1.15–1.40)
HCT: 26 % — ABNORMAL LOW (ref 36.0–46.0)
HEMATOCRIT: 29 % — AB (ref 36.0–46.0)
HEMATOCRIT: 29 % — AB (ref 36.0–46.0)
HEMOGLOBIN: 9.9 g/dL — AB (ref 12.0–15.0)
Hemoglobin: 9.9 g/dL — ABNORMAL LOW (ref 12.0–15.0)
Hemoglobin: 8.8 g/dL — ABNORMAL LOW (ref 12.0–15.0)
O2 SAT: 98 %
O2 SAT: 98 %
O2 Saturation: 100 %
PCO2 ART: 39.8 mmHg (ref 32.0–48.0)
PO2 ART: 112 mmHg — AB (ref 83.0–108.0)
POTASSIUM: 4.1 mmol/L (ref 3.5–5.1)
POTASSIUM: 4.1 mmol/L (ref 3.5–5.1)
POTASSIUM: 3.9 mmol/L (ref 3.5–5.1)
Patient temperature: 36.7
Patient temperature: 36.8
SODIUM: 139 mmol/L (ref 135–145)
SODIUM: 142 mmol/L (ref 135–145)
Sodium: 143 mmol/L (ref 135–145)
TCO2: 18 mmol/L — ABNORMAL LOW (ref 22–32)
TCO2: 22 mmol/L (ref 22–32)
TCO2: 24 mmol/L (ref 22–32)
pCO2 arterial: 38.6 mmHg (ref 32.0–48.0)
pCO2 arterial: 38.8 mmHg (ref 32.0–48.0)
pH, Arterial: 7.257 — ABNORMAL LOW (ref 7.350–7.450)
pH, Arterial: 7.334 — ABNORMAL LOW (ref 7.350–7.450)
pH, Arterial: 7.363 (ref 7.350–7.450)
pO2, Arterial: 112 mmHg — ABNORMAL HIGH (ref 83.0–108.0)
pO2, Arterial: 243 mmHg — ABNORMAL HIGH (ref 83.0–108.0)

## 2017-09-29 LAB — POCT I-STAT 3, ART BLOOD GAS (G3+)
ACID-BASE DEFICIT: 12 mmol/L — AB (ref 0.0–2.0)
Bicarbonate: 13.5 mmol/L — ABNORMAL LOW (ref 20.0–28.0)
O2 SAT: 93 %
PH ART: 7.284 — AB (ref 7.350–7.450)
TCO2: 14 mmol/L — ABNORMAL LOW (ref 22–32)
pCO2 arterial: 28.6 mmHg — ABNORMAL LOW (ref 32.0–48.0)
pO2, Arterial: 73 mmHg — ABNORMAL LOW (ref 83.0–108.0)

## 2017-09-29 LAB — GLUCOSE, CAPILLARY
GLUCOSE-CAPILLARY: 191 mg/dL — AB (ref 65–99)
GLUCOSE-CAPILLARY: 285 mg/dL — AB (ref 65–99)
GLUCOSE-CAPILLARY: 326 mg/dL — AB (ref 65–99)
GLUCOSE-CAPILLARY: 59 mg/dL — AB (ref 65–99)
Glucose-Capillary: 328 mg/dL — ABNORMAL HIGH (ref 65–99)
Glucose-Capillary: 324 mg/dL — ABNORMAL HIGH (ref 65–99)
Glucose-Capillary: 272 mg/dL — ABNORMAL HIGH (ref 65–99)
Glucose-Capillary: 217 mg/dL — ABNORMAL HIGH (ref 65–99)
Glucose-Capillary: 171 mg/dL — ABNORMAL HIGH (ref 65–99)
Glucose-Capillary: 148 mg/dL — ABNORMAL HIGH (ref 65–99)
Glucose-Capillary: 74 mg/dL (ref 65–99)

## 2017-09-29 LAB — BASIC METABOLIC PANEL
ANION GAP: 7 (ref 5–15)
Anion gap: 18 — ABNORMAL HIGH (ref 5–15)
BUN: 11 mg/dL (ref 6–20)
BUN: 20 mg/dL (ref 6–20)
CALCIUM: 7.4 mg/dL — AB (ref 8.9–10.3)
CO2: 14 mmol/L — ABNORMAL LOW (ref 22–32)
CO2: 25 mmol/L (ref 22–32)
Calcium: 8 mg/dL — ABNORMAL LOW (ref 8.9–10.3)
Chloride: 107 mmol/L (ref 101–111)
Chloride: 110 mmol/L (ref 101–111)
Creatinine, Ser: 1.51 mg/dL — ABNORMAL HIGH (ref 0.44–1.00)
Creatinine, Ser: 1.19 mg/dL — ABNORMAL HIGH (ref 0.44–1.00)
GFR calc Af Amer: 38 mL/min — ABNORMAL LOW (ref >60)
GFR calc Af Amer: 51 mL/min — ABNORMAL LOW (ref >60)
GFR calc non Af Amer: 33 mL/min — ABNORMAL LOW (ref >60)
GFR, EST NON AFRICAN AMERICAN: 44 mL/min — AB (ref >60)
Glucose, Bld: 262 mg/dL — ABNORMAL HIGH (ref 65–99)
Glucose, Bld: 150 mg/dL — ABNORMAL HIGH (ref 65–99)
POTASSIUM: 3.5 mmol/L (ref 3.5–5.1)
Potassium: 4.7 mmol/L (ref 3.5–5.1)
SODIUM: 142 mmol/L (ref 135–145)
Sodium: 139 mmol/L (ref 135–145)

## 2017-09-29 LAB — MAGNESIUM: Magnesium: 2.6 mg/dL — ABNORMAL HIGH (ref 1.7–2.4)

## 2017-09-29 LAB — ABO/RH: ABO/RH(D): O POS

## 2017-09-29 LAB — LACTIC ACID, PLASMA: LACTIC ACID, VENOUS: 1.5 mmol/L (ref 0.5–1.9)

## 2017-09-29 LAB — PREPARE RBC (CROSSMATCH)

## 2017-09-29 LAB — BETA-HYDROXYBUTYRIC ACID: Beta-Hydroxybutyric Acid: 6.59 mmol/L — ABNORMAL HIGH (ref 0.05–0.27)

## 2017-09-29 SURGERY — LAPAROTOMY, EXPLORATORY
Anesthesia: General | Site: Abdomen

## 2017-09-29 MED ORDER — SODIUM BICARBONATE 8.4 % IV SOLN
INTRAVENOUS | Status: DC | PRN
  Administered 2017-09-29 (×3): 50 meq via INTRAVENOUS

## 2017-09-29 MED ORDER — FENTANYL CITRATE (PF) 250 MCG/5ML IJ SOLN
INTRAMUSCULAR | Status: AC
  Filled 2017-09-29: qty 5

## 2017-09-29 MED ORDER — SODIUM CHLORIDE 0.9 % IV SOLN: 10.0000 mL/h | Freq: Once | INTRAVENOUS | Status: DC

## 2017-09-29 MED ORDER — LIDOCAINE 2% (20 MG/ML) 5 ML SYRINGE
INTRAMUSCULAR | Status: AC
  Filled 2017-09-29: qty 5

## 2017-09-29 MED ORDER — ACETAMINOPHEN 650 MG RE SUPP: 650.0000 mg | Freq: Four times a day (QID) | RECTAL | Status: DC | PRN

## 2017-09-29 MED ORDER — SODIUM CHLORIDE 0.9 % IV SOLN
INTRAVENOUS | Status: DC | PRN
  Administered 2017-09-29: 16:00:00 via INTRAVENOUS

## 2017-09-29 MED ORDER — INSULIN ASPART 100 UNIT/ML ~~LOC~~ SOLN: 0.0000 [IU] | Freq: Every day | SUBCUTANEOUS | Status: DC

## 2017-09-29 MED ORDER — ROCURONIUM BROMIDE 50 MG/5ML IV SOLN
INTRAVENOUS | Status: AC
  Filled 2017-09-29: qty 1

## 2017-09-29 MED ORDER — ETOMIDATE 2 MG/ML IV SOLN
INTRAVENOUS | Status: DC | PRN
  Administered 2017-09-29: 14 mg via INTRAVENOUS

## 2017-09-29 MED ORDER — VASOPRESSIN 20 UNIT/ML IV SOLN
INTRAVENOUS | Status: AC
  Filled 2017-09-29: qty 1

## 2017-09-29 MED ORDER — DEXTROSE-NACL 5-0.45 % IV SOLN: INTRAVENOUS | Status: DC

## 2017-09-29 MED ORDER — LACTATED RINGERS IV BOLUS
1000.0000 mL | Freq: Once | INTRAVENOUS | Status: AC
  Administered 2017-09-29: 1000 mL via INTRAVENOUS

## 2017-09-29 MED ORDER — PROPOFOL 10 MG/ML IV BOLUS
INTRAVENOUS | Status: AC
  Filled 2017-09-29: qty 20

## 2017-09-29 MED ORDER — METOPROLOL TARTRATE 5 MG/5ML IV SOLN: 5.0000 mg | Freq: Three times a day (TID) | INTRAVENOUS | Status: DC

## 2017-09-29 MED ORDER — FENTANYL CITRATE (PF) 100 MCG/2ML IJ SOLN
INTRAMUSCULAR | Status: AC
  Filled 2017-09-29: qty 2

## 2017-09-29 MED ORDER — 0.9 % SODIUM CHLORIDE (POUR BTL) OPTIME
TOPICAL | Status: DC | PRN
  Administered 2017-09-29: 3000 mL
  Administered 2017-09-29: 2000 mL

## 2017-09-29 MED ORDER — ONDANSETRON HCL 4 MG/2ML IJ SOLN: 4.0000 mg | Freq: Once | INTRAMUSCULAR | Status: DC | PRN

## 2017-09-29 MED ORDER — PHENYLEPHRINE HCL 10 MG/ML IJ SOLN
INTRAVENOUS | Status: DC | PRN
  Administered 2017-09-29: 40 ug/min via INTRAVENOUS

## 2017-09-29 MED ORDER — ALBUMIN HUMAN 5 % IV SOLN
INTRAVENOUS | Status: DC | PRN
  Administered 2017-09-29: 18:00:00 via INTRAVENOUS

## 2017-09-29 MED ORDER — ACETAMINOPHEN 325 MG PO TABS: 650.0000 mg | ORAL_TABLET | Freq: Four times a day (QID) | ORAL | Status: DC | PRN

## 2017-09-29 MED ORDER — LORAZEPAM 2 MG/ML IJ SOLN: 1.0000 mg | Freq: Four times a day (QID) | INTRAMUSCULAR | Status: DC | PRN

## 2017-09-29 MED ORDER — DEXTROSE-NACL 5-0.45 % IV SOLN
INTRAVENOUS | Status: DC
  Administered 2017-09-30: via INTRAVENOUS

## 2017-09-29 MED ORDER — MIDAZOLAM HCL 2 MG/2ML IJ SOLN
INTRAMUSCULAR | Status: AC
  Filled 2017-09-29: qty 2

## 2017-09-29 MED ORDER — ONDANSETRON HCL 4 MG/2ML IJ SOLN
INTRAMUSCULAR | Status: DC | PRN
  Administered 2017-09-29: 4 mg via INTRAVENOUS

## 2017-09-29 MED ORDER — LACTATED RINGERS IV SOLN
INTRAVENOUS | Status: DC | PRN
  Administered 2017-09-29: 18:00:00 via INTRAVENOUS

## 2017-09-29 MED ORDER — EPHEDRINE SULFATE 50 MG/ML IJ SOLN
INTRAMUSCULAR | Status: AC
  Filled 2017-09-29: qty 1

## 2017-09-29 MED ORDER — HEPARIN SODIUM (PORCINE) 5000 UNIT/ML IJ SOLN
5000.0000 [IU] | Freq: Three times a day (TID) | INTRAMUSCULAR | Status: DC
  Administered 2017-09-30 – 2017-10-10 (×32): 5000 [IU] via SUBCUTANEOUS
  Filled 2017-09-29 (×33): qty 1

## 2017-09-29 MED ORDER — SODIUM CHLORIDE 0.9 % IJ SOLN
INTRAMUSCULAR | Status: AC
  Filled 2017-09-29: qty 10

## 2017-09-29 MED ORDER — INSULIN ASPART 100 UNIT/ML ~~LOC~~ SOLN
4.0000 [IU] | Freq: Once | SUBCUTANEOUS | Status: AC
  Administered 2017-09-29: 4 [IU] via SUBCUTANEOUS

## 2017-09-29 MED ORDER — LACTATED RINGERS IV SOLN
INTRAVENOUS | Status: DC
Start: 2017-09-29 — End: 2017-09-30
  Administered 2017-09-29 – 2017-09-30 (×2): via INTRAVENOUS

## 2017-09-29 MED ORDER — POTASSIUM CHLORIDE 10 MEQ/100ML IV SOLN
10.0000 meq | INTRAVENOUS | Status: AC
  Administered 2017-09-29: 10 meq via INTRAVENOUS
  Filled 2017-09-29 (×2): qty 100

## 2017-09-29 MED ORDER — HYDRALAZINE HCL 20 MG/ML IJ SOLN: 10.0000 mg | Freq: Four times a day (QID) | INTRAMUSCULAR | Status: DC | PRN

## 2017-09-29 MED ORDER — DEXTROSE 50 % IV SOLN
INTRAVENOUS | Status: AC
  Administered 2017-09-29: 16 mL
  Administered 2017-09-30: 18 mL
  Filled 2017-09-29: qty 50

## 2017-09-29 MED ORDER — ONDANSETRON HCL 4 MG/2ML IJ SOLN
INTRAMUSCULAR | Status: AC
  Filled 2017-09-29: qty 2

## 2017-09-29 MED ORDER — FENTANYL CITRATE (PF) 100 MCG/2ML IJ SOLN
25.0000 ug | INTRAMUSCULAR | Status: DC | PRN
  Administered 2017-09-29 (×2): 50 ug via INTRAVENOUS

## 2017-09-29 MED ORDER — SUGAMMADEX SODIUM 500 MG/5ML IV SOLN
INTRAVENOUS | Status: DC | PRN
  Administered 2017-09-29: 280 mg via INTRAVENOUS

## 2017-09-29 MED ORDER — SODIUM CHLORIDE 0.9 % IV SOLN
INTRAVENOUS | Status: DC
  Administered 2017-09-29: 2.7 [IU]/h via INTRAVENOUS
  Filled 2017-09-29 (×2): qty 1

## 2017-09-29 MED ORDER — SODIUM CHLORIDE 0.9 % IV SOLN: INTRAVENOUS | Status: DC

## 2017-09-29 MED ORDER — INSULIN ASPART 100 UNIT/ML ~~LOC~~ SOLN
8.0000 [IU] | Freq: Once | SUBCUTANEOUS | Status: AC
  Administered 2017-09-29: 8 [IU] via SUBCUTANEOUS

## 2017-09-29 MED ORDER — FENTANYL CITRATE (PF) 100 MCG/2ML IJ SOLN
INTRAMUSCULAR | Status: DC | PRN
  Administered 2017-09-29: 200 ug via INTRAVENOUS
  Administered 2017-09-29: 50 ug via INTRAVENOUS

## 2017-09-29 MED ORDER — HYDROMORPHONE HCL 1 MG/ML IJ SOLN
1.0000 mg | INTRAMUSCULAR | Status: DC | PRN
Start: 2017-09-29 — End: 2017-09-30
  Administered 2017-09-29: 2 mg via INTRAVENOUS
  Administered 2017-09-30: 1 mg via INTRAVENOUS
  Filled 2017-09-29 (×2): qty 2

## 2017-09-29 SURGICAL SUPPLY — 46 items
BARRIER SKIN 2 1/4 (WOUND CARE) ×3 IMPLANT
BLADE CLIPPER SURG (BLADE) IMPLANT
BNDG GAUZE ELAST 4 BULKY (GAUZE/BANDAGES/DRESSINGS) ×3 IMPLANT
CANISTER SUCT 3000ML PPV (MISCELLANEOUS) ×3 IMPLANT
CHLORAPREP W/TINT 26ML (MISCELLANEOUS) ×3 IMPLANT
COVER SURGICAL LIGHT HANDLE (MISCELLANEOUS) ×3 IMPLANT
DRAPE LAPAROSCOPIC ABDOMINAL (DRAPES) ×3 IMPLANT
DRAPE WARM FLUID 44X44 (DRAPE) ×3 IMPLANT
DRSG OPSITE POSTOP 4X10 (GAUZE/BANDAGES/DRESSINGS) IMPLANT
DRSG OPSITE POSTOP 4X8 (GAUZE/BANDAGES/DRESSINGS) IMPLANT
ELECT BLADE 6.5 EXT (BLADE) ×3 IMPLANT
ELECT CAUTERY BLADE 6.4 (BLADE) ×3 IMPLANT
ELECT REM PT RETURN 9FT ADLT (ELECTROSURGICAL) ×3
ELECTRODE REM PT RTRN 9FT ADLT (ELECTROSURGICAL) ×2 IMPLANT
GLOVE BIOGEL M STRL SZ7.5 (GLOVE) ×3 IMPLANT
GLOVE BIOGEL PI IND STRL 8 (GLOVE) ×4 IMPLANT
GLOVE BIOGEL PI INDICATOR 8 (GLOVE) ×2
GOWN STRL REUS W/ TWL LRG LVL3 (GOWN DISPOSABLE) ×2 IMPLANT
GOWN STRL REUS W/TWL 2XL LVL3 (GOWN DISPOSABLE) ×3 IMPLANT
GOWN STRL REUS W/TWL LRG LVL3 (GOWN DISPOSABLE) ×1
KIT BASIN OR (CUSTOM PROCEDURE TRAY) ×3 IMPLANT
KIT TURNOVER KIT B (KITS) ×3 IMPLANT
LIGASURE IMPACT 36 18CM CVD LR (INSTRUMENTS) ×3 IMPLANT
NS IRRIG 1000ML POUR BTL (IV SOLUTION) ×6 IMPLANT
PACK GENERAL/GYN (CUSTOM PROCEDURE TRAY) ×3 IMPLANT
PAD ABD 8X10 STRL (GAUZE/BANDAGES/DRESSINGS) ×3 IMPLANT
PAD ARMBOARD 7.5X6 YLW CONV (MISCELLANEOUS) ×3 IMPLANT
SPECIMEN JAR LARGE (MISCELLANEOUS) IMPLANT
SPONGE LAP 18X18 X RAY DECT (DISPOSABLE) ×6 IMPLANT
STAPLER CUT CVD 40MM BLUE (STAPLE) ×3 IMPLANT
STAPLER CUT RELOAD BLUE (STAPLE) ×3 IMPLANT
STAPLER CUT RELOAD GREEN (STAPLE) ×3 IMPLANT
STAPLER VISISTAT 35W (STAPLE) IMPLANT
SUCTION POOLE TIP (SUCTIONS) ×3 IMPLANT
SUT PDS AB 1 TP1 96 (SUTURE) ×6 IMPLANT
SUT PROLENE 2 0 SH DA (SUTURE) ×3 IMPLANT
SUT SILK 2 0 SH CR/8 (SUTURE) ×3 IMPLANT
SUT SILK 2 0 TIES 10X30 (SUTURE) ×3 IMPLANT
SUT SILK 3 0 SH CR/8 (SUTURE) ×3 IMPLANT
SUT SILK 3 0 TIES 10X30 (SUTURE) ×3 IMPLANT
SUT VIC AB 3-0 SH 18 (SUTURE) IMPLANT
SUT VIC AB 3-0 SH 8-18 (SUTURE) ×6 IMPLANT
TAPE CLOTH SURG 4X10 WHT LF (GAUZE/BANDAGES/DRESSINGS) ×3 IMPLANT
TOWEL OR 17X26 10 PK STRL BLUE (TOWEL DISPOSABLE) ×3 IMPLANT
TRAY FOLEY MTR SLVR 16FR STAT (SET/KITS/TRAYS/PACK) ×3 IMPLANT
YANKAUER SUCT BULB TIP NO VENT (SUCTIONS) ×3 IMPLANT

## 2017-09-29 NOTE — Brief Op Note (Signed)
09/29/2017  6:39 PM  PATIENT:  Barbara [name]  77 y.o. female  PRE-OPERATIVE DIAGNOSIS:  Perforated sigmoid diverticulitis with intra-abdominal abscess  POST-OPERATIVE DIAGNOSIS:  same  PROCEDURE:  Procedure(s): EXPLORATORY LAPAROTOMY (N/A) LEFT & SIGMOID COLECTOMY WITH COLOSTOMY CREATION (HARTMANN PROCEDURE) (Left) Drainage of intra-abdominal abscess Mobilization of splenic flexure  SURGEON:  Surgeon(s) and Role:    Greer Pickerel, MD - Primary    * Erroll Luna, MD - Assisting  PHYSICIAN ASSISTANT:   ASSISTANTS: see above   ANESTHESIA:   general  EBL:  100 mL   BLOOD ADMINISTERED:1u CC PRBC  DRAINS: Nasogastric Tube and Urinary Catheter (Foley)   LOCAL MEDICATIONS USED:  NONE  SPECIMEN:  Source of Specimen:  1)sigmoid colon - stitch proximal; 2) descending colon - stitch proximal  DISPOSITION OF SPECIMEN:  PATHOLOGY  COUNTS:  YES  TOURNIQUET:  * No tourniquets in log *  DICTATION: .Other Dictation: Dictation Number [id number]  PLAN OF CARE: back to ICU  PATIENT DISPOSITION:  PACU - guarded condition.   Delay start of Pharmacological VTE agent (>24hrs) due to surgical blood loss or risk of bleeding: no  Leighton Ruff. Redmond Pulling, MD, FACS General, Bariatric, & Minimally Invasive Surgery [facility], [place]

## 2017-09-29 NOTE — Transfer of Care (Signed)
Immediate Anesthesia Transfer of Care Note  Patient: Barbara [name]  Procedure(s) Performed: EXPLORATORY LAPAROTOMY (N/A ) EXTENDED LEFT HEMI COLECTOMY WITH COLOSTOMY CREATION (HARTMANN PROCEDURE) (Left Abdomen)  Patient Location: PACU  Anesthesia Type:General  Level of Consciousness: awake and patient cooperative  Airway & Oxygen Therapy: Patient Spontanous Breathing and Patient connected to face mask oxygen  Post-op Assessment: Report given to RN and Post -op Vital signs reviewed and stable  Post vital signs: Reviewed and stable  Last Vitals:  Vitals Value Taken Time  BP 143/58 09/29/2017  6:57 PM  Temp    Pulse 96 09/29/2017  6:59 PM  Resp 22 09/29/2017  6:59 PM  SpO2 95 % 09/29/2017  6:59 PM  Vitals shown include unvalidated device data.  Last Pain:  Vitals:   09/29/17 1204  TempSrc: Oral  PainSc:       Patients Stated Pain Goal: 3 (50/35/46 5681)  Complications: No apparent anesthesia complications

## 2017-09-29 NOTE — Consult Note (Addendum)
Cardiology Consultation:   Patient ID: Barbara [name]; [id number]; 09-23-1940   Admit date: 09/26/2017 Date of Consult: 09/29/2017  Primary Care Provider: Asencion Noble, MD Primary Cardiologist: Dr. Harl Bowie Primary Electrophysiologist:  Dr. Caryl Comes   Patient Profile:   Barbara [name] is a 77 y.o. female with a hx of atrial tachycardia, in review of Dr. Olin Pia note, no hx of AFib, though I do see mention elsewhere of PAFib with hx of GIB on eliquis and prior consideration of Watchman, she has been maintained on Flecainide and BB, orthostatic hypotension, some degree of SB, HTN, DM, CVA (x2), HLD, ongoing smoker who is being seen today for the evaluation of a wide QRS tachycardia at the request of Dr. Harl Bowie.  History of Present Illness:   Ms. Barbara [name] was admitted to [facility] 09/27/17 with abd pain, found with diverticulitis w/abscess started on abx tx with planned surgery later in the week.  Yesterday morning the patient developed WCT 200bpm in review of Dr. Nelly Laurence note she did not lose pulse, did become hypotensive and due to instability cardioverted with a single shock 200J and started on amiodarone, recommended transfer to [facility] for EP evaluation.  It looks like she got a dose of Flecainide 5/7 evening, I don't see her BB ordered, her flecainide has been d/c'd  LABS yesterday K+ 4.2 Mag 1.8 BUN/Creat 7/0.86 Trop I: 0.03, 0.36  WBC 18.8 H/H 10/34 Plts 343   Surgery at bedside, planned to proceed on with surgery, by exam felt to have peritonitis, and septic, not felt to be candidate for drain, and felt that abx tx has not been effective as of yet.  At this point not felt to have another option for treatment.  She c/o belly pain, no CP, or SOB  Past Medical History:  Diagnosis Date  . Arteriosclerotic cardiovascular disease (ASCVD)    non-obstructive; negative stress nuclear and normal echo in 08/[facility] in 03/2000-20%  main 20% LAD, 30% circumflex, 30% RCA,  hyperdynamic LV  . Asthma    Mild  . Chest pain   . Chronic back pain   . Diabetes mellitus    A1c of 7.6 in 04/2010; moderate to high dose insulin  . GERD (gastroesophageal reflux disease)   . Hyperlipidemia    total cholesterol of 271 and LDL of 187 in 07/2010  . Hypertension   . Macular degeneration   . Palpitations   . Stroke [facility]) 1993 2008   short term memory loss  . Tobacco abuse     Past Surgical History:  Procedure Laterality Date  . APPENDECTOMY    . CATARACT EXTRACTION     left  . COLONOSCOPY  2009  . COLONOSCOPY  03/30/2012   Procedure: COLONOSCOPY;  Surgeon: Rogene Houston, MD;  Location[facility];  Service: Endoscopy;  Laterality: N/A;  730  . COLONOSCOPY N/A 12/13/2013   Procedure: COLONOSCOPY;  Surgeon: Rogene Houston, MD;  Location: [facility] ENDO [facility];  Service: Endoscopy;  Laterality: N/A;  1200  . ESOPHAGOGASTRODUODENOSCOPY  10/21/2011   Procedure: ESOPHAGOGASTRODUODENOSCOPY (EGD);  Surgeon: Rogene Houston, MD;  Location: [facility];  Service: Endoscopy;  Laterality: N/A;  1200  . ESOPHAGOGASTRODUODENOSCOPY N/A 12/13/2013   Procedure: ESOPHAGOGASTRODUODENOSCOPY (EGD);  Surgeon: Rogene Houston, MD;  Location: [facility];  Service: Endoscopy;  Laterality: N/A;  . TUBAL LIGATION        Inpatient Medications: Scheduled Meds: . brimonidine  1 drop Both Eyes TID  . Chlorhexidine Gluconate  Cloth  6 each Topical Daily  . enoxaparin (LOVENOX) injection  30 mg Subcutaneous Daily  . insulin aspart  0-5 Units Subcutaneous QHS  . mouth rinse  15 mL Mouth Rinse BID  . sodium chloride flush  10-40 mL Intracatheter Q12H   Continuous Infusions: . sodium chloride 100 mL/hr at 09/29/17 1000  . sodium chloride    . amiodarone 30 mg/hr (09/29/17 1000)  . meropenem (MERREM) IV Stopped (09/28/17 2351)   PRN Meds: acetaminophen **OR** acetaminophen, HYDROmorphone (DILAUDID) injection, LORazepam, ondansetron **OR** ondansetron (ZOFRAN) IV, sodium chloride  flush  Allergies:    Allergies  Allergen Reactions  . Bee Venom Anaphylaxis  . Sulfamethoxazole Nausea And Vomiting  . Penicillins Itching and Rash      . Sulfonamide Derivatives Nausea And Vomiting    Social History:   Social History   Socioeconomic History  . Marital status: Married    Spouse name: Not on file  . Number of children: 1  . Years of education: Not on file  . Highest education level: Not on file  Occupational History  . Occupation: retired    Comment: hairdresser  Social Needs  . Financial resource strain: Not on file  . Food insecurity:    Worry: Not on file    Inability: Not on file  . Transportation needs:    Medical: Not on file    Non-medical: Not on file  Tobacco Use  . Smoking status: Current Every Day Smoker    Packs/day: 0.25    Years: 15.00    Pack years: 3.75    Types: Cigarettes  . Smokeless tobacco: Never Used  Substance and Sexual Activity  . Alcohol use: No  . Drug use: No  . Sexual activity: Not Currently  Lifestyle  . Physical activity:    Days per week: Not on file    Minutes per session: Not on file  . Stress: Not on file  Relationships  . Social connections:    Talks on phone: Not on file    Gets together: Not on file    Attends religious service: Not on file    Active member of club or organization: Not on file    Attends meetings of clubs or organizations: Not on file    Relationship status: Not on file  . Intimate partner violence:    Fear of current or ex partner: Not on file    Emotionally abused: Not on file    Physically abused: Not on file    Forced sexual activity: Not on file  Other Topics Concern  . Not on file  Social History Narrative  . Not on file    Family History:   Family History  Problem Relation Age of Onset  . Heart attack Mother   . Cancer Father   . Cancer Sister   . Heart attack Brother   . Cancer Brother   . Cancer Sister   . Cancer Sister      ROS:  Please see the history of  present illness.  All other ROS reviewed and negative.     Physical Exam/Data:   Vitals:   09/29/17 0819 09/29/17 0900 09/29/17 1000 09/29/17 1100  BP:  (!) 118/48 (!) 106/46 99/79  Pulse:  (!) 103 (!) 104 (!) 101  Resp:  (!) 29 (!) 29 19  Temp: 98.9 F (37.2 C)     TempSrc: Oral     SpO2:  97% 94% 96%  Weight:  Height:        Intake/Output Summary (Last 24 hours) at 09/29/2017 1137 Last data filed at 09/29/2017 1000 Gross per 24 hour  Intake 3236.21 ml  Output 300 ml  Net 2936.21 ml   Filed Weights   09/27/17 0545 09/28/17 0500 09/29/17 0437  Weight: 142 lb 13.7 oz (64.8 kg) 145 lb 15.1 oz (66.2 kg) 150 lb 12.7 oz (68.4 kg)   Body mass index is 26.71 kg/m.  General:  Well nourished, well developed, in no acute distress, she is sleepy s/p dilaudid but wakes easily HEENT: normal Lymph: not assessed Neck: no JVD Endocrine:  Not assessed Vascular: No carotid bruits Cardiac:  RRR; no murmurs, gallops or rubs Lungs:  CTA ant/lat auscultation only, no wheezing, rhonchi or rales  Abd: pt gaurds, not examined  Ext: no edema Musculoskeletal:  No deformities Skin: warm and dry  Neuro:  No gross focal abnormalities noted Psych:  S/p pain meds, sleepy, but plesant   EKG:  The EKG was personally reviewed and demonstrates:   Presenting is SR 71bpm, normal intervals, QRS 115ms Yesterday ST 105bpm, PR 173ms, QRS 157ms, QTc 474ms, no acute ischemic looking changes Telemetry:  Telemetry was personally reviewed and demonstrates:   SR/ST >>> WCT has changing morphology/axis >> ST post shock  Relevant CV Studies:  09/28/17: TTE Study Conclusions - Left ventricle: The cavity size was normal. Wall thickness was   increased in a pattern of mild LVH. Systolic function was   vigorous. The estimated ejection fraction was in the range of 65%   to 70%. Wall motion was normal; there were no regional wall   motion abnormalities. Left ventricular diastolic function   parameters were  normal. - Aortic valve: Valve area (VTI): 1.96 cm^2. Valve area (Vmax):   1.96 cm^2. Valve area (Vmean): 1.87 cm^2. - Mitral valve: There was mild regurgitation. - Left atrium: The atrium was moderately dilated. - Right atrium: The atrium was mildly dilated. - Atrial septum: No defect or patent foramen ovale was identified. - Pulmonary arteries: Systolic pressure was mildly increased. - Technically adequate study.  10/01/15: ETT Response to Stress There was no ST segment deviation noted during stress.  There were no significant arrhythmias noted during the test.  ECG was interpretable.  Pt had reassonable exercise tolerance and far better on the flecainide that she had previously. No evidence of exercised induced wide complex rhythms   02/27/14: Stress myoview Overall Impression:  Normal stress nuclear study. There is no scar or ischemia. This is a low risk scan. LV Ejection Fraction: 70%.  LV Wall Motion:  Normal Wall Motion.   Laboratory Data:  Chemistry Recent Labs  Lab 09/27/17 0609 09/28/17 0412 09/29/17 0530  NA 135 136 139  K 4.4 4.2 4.7  CL 100* 103 107  CO2 25 22 14*  GLUCOSE 183* 136* 262*  BUN 8 7 11   CREATININE 0.95 0.86 1.51*  CALCIUM 8.1* 8.1* 8.0*  GFRNONAA 58* >60 33*  GFRAA >60 >60 38*  ANIONGAP 10 11 18*    Recent Labs  Lab 09/23/17 1852 09/26/17 2207 09/27/17 0609  PROT 6.7 6.9 5.9*  ALBUMIN 3.4* 3.5 2.9*  AST 24 27 24   ALT 19 19 16   ALKPHOS 123 100 87  BILITOT 0.5 0.7 0.7   Hematology Recent Labs  Lab 09/26/17 2207 09/28/17 0412 09/29/17 0530  WBC 14.1* 18.8* 19.2*  RBC 3.88 3.61* 3.68*  HGB 11.3* 10.7* 10.7*  HCT 35.1* 34.3* 34.8*  MCV 90.5 95.0 94.6  MCH 29.1 29.6 29.1  MCHC 32.2 31.2 30.7  RDW 13.0 13.5 13.9  PLT 346 343 399   Cardiac Enzymes Recent Labs  Lab 09/26/17 2207 09/28/17 0848 09/28/17 1419  TROPONINI <0.03 0.03* 0.36*   No results for input(s): TROPIPOC in the last 168 hours.  BNPNo results for input(s):  BNP, PROBNP in the last 168 hours.  DDimer No results for input(s): DDIMER in the last 168 hours.  Radiology/Studies:   Ct Abdomen Pelvis W Contrast Result Date: 09/27/2017 CLINICAL DATA:  77 y/o  F; lower abdominal pain. EXAM: CT ABDOMEN AND PELVIS WITH CONTRAST TECHNIQUE: Multidetector CT imaging of the abdomen and pelvis was performed using the standard protocol following bolus administration of intravenous contrast. CONTRAST:  124mL ISOVUE-300 IOPAMIDOL (ISOVUE-300) INJECTION 61% COMPARISON:  09/23/2017 CT abdomen and pelvis FINDINGS: Lower chest: No acute abnormality. Hepatobiliary: No focal liver abnormality is seen. No gallstones, gallbladder wall thickening, or biliary dilatation. Pancreas: Unremarkable. No pancreatic ductal dilatation or surrounding inflammatory changes. Spleen: Normal in size without focal abnormality. Adrenals/Urinary Tract: Adrenal glands are unremarkable. Kidneys are normal, without renal calculi, focal lesion, or hydronephrosis. Bladder is unremarkable. Stomach/Bowel: Acute perforated diverticulitis (series 5, image 49) with oblong fluid collection extending between the bladder and the uterus measuring 7.1 cm in length and 1.8 cm in diameter (series 2, image 67 and series 5, image 45), increased in size in comparison with the prior CT of the abdomen and pelvis. Contrast and debris is present within the perforated collection. Stomach and small bowel are unremarkable. Vascular/Lymphatic: Aortic atherosclerosis. No enlarged abdominal or pelvic lymph nodes. Reproductive: Uterus and bilateral adnexa are unremarkable. Other: No abdominal wall hernia or abnormality. No abdominopelvic ascites. Musculoskeletal: No acute or significant osseous findings. IMPRESSION: Acute perforated diverticulitis. Pericolonic abscess extending between the bladder and uterus has increased in size and surrounding inflammation. Contrast and debris extends into the abscess cavity. Electronically Signed   By:  Kristine Garbe M.D.   On: 09/27/2017 00:34    Assessment and Plan:   1. WCT, Dr. Lovena Le has reviewed rhythm strips, likely was SVT with abbarency     Last ischemic w/u noted above w/o evidence of ischemia     Mild Trop elevation likely 2/2  shock     LVEF 65-70% post event without WMA     Patient denies any exertional intolerances of late, no CP  In review of record, patient yesterday morning was with escalating abdominal pain prior to the tachycardic event, cardiology recommended not to proceed with surgery at [facility]   Dr. Lovena Le has seen and examined the patient.  No further cardiac w/u pre-operatively, patient is high surgical risk  CCM on board, s/p pain meds with some hypotension noted will be addressing, recommend continue amiodarone gtt.       For questions or updates, please contact [facility] Please consult [email or web address] for contact info under Cardiology/STEMI.   Signed, Baldwin Jamaica, PA-C  09/29/2017 11:37 AM   EP Attending  Patient seen and examined. Agree with above. The patient is critically ill and has a narrow and wide QRS tachycardia almost certainly due to the adrenaline surge from her acute abdomen. Her EF is normal and she may proceed with surgery. She is low cardiovascular risk. She may proceed with surgery. For now, continue amiodarone. She is at risk for developing atrial fib post op. Once her gut wakes up, she can be transitioned to oral amiodarone. I do not think her tachycardia  is VT, although I cannot rule out RVOT VT. Most likely she has SVT with abherration alternating with a narrow complex.  Mikle Bosworth.D.

## 2017-09-29 NOTE — Consult Note (Signed)
Barbara [name] 1941/03/26  [id number].    Requesting MD: Dr. Morrison Old Chief Complaint/Reason for Consult: perforated diverticulitis  HPI:  This is a 77 yo white female with a history of SVT on flecainide at home who has been having abdominal pain for the last 2 weeks. History is provided by family at bedside as patient just received pain medication and is sedated.  She has been to [facility] several times.  The first time the daughter states that she went to the ED and they sent her home with no further treatment as her CT scan did not reveal any acute findings.  She continued to have pain as well as nausea and went back to the ED.  At that time she was found to have diverticulitis with a tiny abscess.  She was admitted for a couple of days on IV Cipro/Flagyl.  She was discharged home, but returned shortly after due to persistent pain.  A repeat scan revealed acutely perforated diverticulitis with increase in fluid collection size.  Contrast flowed freely into fluid collection.  The plan was for Hartman's yesterday at [facility], but the patient developed SVT and hypotension.  She required cardioversion and has been placed on amiodarone and lopressor.  She was tx to [facility] for further cardiac and surgical care.  We have been asked to see her for surgical evaluation.  ROS: ROS: please see HPI, otherwise, she is very sedated currently secondary to pain medication and unable to provide history of ROS.  +h/o CVA, but no blood thinners as multiple caused GI bleed. +constipation. Does not mobilize for long distances due to back problems but does get around the house and gardens  Family History  Problem Relation Age of Onset  . Heart attack Mother   . Cancer Father   . Cancer Sister   . Heart attack Brother   . Cancer Brother   . Cancer Sister   . Cancer Sister     Past Medical History:  Diagnosis Date  . Arteriosclerotic cardiovascular disease (ASCVD)    non-obstructive; negative stress nuclear  and normal echo in 08/2009 [facility] in 03/2000-20%  main 20% LAD, 30% circumflex, 30% RCA, hyperdynamic LV  . Asthma    Mild  . Chest pain   . Chronic back pain   . Diabetes mellitus    A1c of 7.6 in 04/2010; moderate to high dose insulin  . GERD (gastroesophageal reflux disease)   . Hyperlipidemia    total cholesterol of 271 and LDL of 187 in 07/2010  . Hypertension   . Macular degeneration   . Palpitations   . Stroke [facility]) 1993 2008   short term memory loss  . Tobacco abuse     Past Surgical History:  Procedure Laterality Date  . APPENDECTOMY    . CATARACT EXTRACTION     left  . COLONOSCOPY  2009  . COLONOSCOPY  03/30/2012   Procedure: COLONOSCOPY;  Surgeon: Rogene Houston, MD;  [facility];  Service: Endoscopy;  Laterality: N/A;  730  . COLONOSCOPY N/A 12/13/2013   Procedure: COLONOSCOPY;  Surgeon: Rogene Houston, MD;  Location: [facility];  Service: Endoscopy;  Laterality: N/A;  1200  . ESOPHAGOGASTRODUODENOSCOPY  10/21/2011   Procedure: ESOPHAGOGASTRODUODENOSCOPY (EGD);  Surgeon: Rogene Houston, MD;  Location[facility];  Service: Endoscopy;  Laterality: N/A;  1200  . ESOPHAGOGASTRODUODENOSCOPY N/A 12/13/2013   Procedure: ESOPHAGOGASTRODUODENOSCOPY (EGD);  Surgeon: Rogene Houston, MD;  Location[facility];  Service: Endoscopy;  Laterality: N/A;  . TUBAL LIGATION      Social History:  reports that she has been smoking cigarettes, 0.5ppd for over 50 years. She has never used smokeless tobacco. She reports that she does not drink alcohol or use drugs.  Allergies:  Allergies  Allergen Reactions  . Bee Venom Anaphylaxis  . Sulfamethoxazole Nausea And Vomiting  . Penicillins Itching and Rash    Has patient had a PCN reaction causing immediate rash, facial/tongue/throat swelling, SOB or lightheadedness with hypotension: no Has patient had a PCN reaction causing severe rash involving mucus membranes or skin necrosis:  {Yes/No:[id number] yes a rash Has patient had a PCN reaction that required hospitalization no Has patient had a PCN reaction occurring within the last 10 years: no If all of the above answers are "NO", then may proceed with Cephalo  . Sulfonamide Derivatives Nausea And Vomiting    Medications Prior to Admission  Medication Sig Dispense Refill  . aspirin EC 325 MG tablet Take 325 mg by mouth every evening.    . B Complex Vitamins (VITAMIN B COMPLEX PO) Take 1 tablet by mouth daily.    . Bevacizumab (AVASTIN) 100 MG/4ML SOLN Inject 1.25 mg into the vein every 8 (eight) weeks.    . brimonidine (ALPHAGAN) 0.2 % ophthalmic solution Place 1 drop into both eyes 3 (three) times daily.   1  . Calcium Carbonate Antacid (TUMS E-X SUGAR FREE PO) Take 2 tablets by mouth every morning.    . Cholecalciferol (VITAMIN D) 1000 UNITS capsule Take 1,000 Units by mouth every morning.     . flecainide (TAMBOCOR) 100 MG tablet TAKE 1 TABLET BY MOUTH TWICE DAILY 180 tablet 3  . insulin glargine (LANTUS) 100 UNIT/ML injection Inject 20 Units into the skin every morning.     . insulin regular (NOVOLIN R,HUMULIN R) 100 units/mL injection Inject 2-10 Units into the skin 3 (three) times daily as needed for high blood sugar (per sliding scale). SLIDING SCALE     . lisinopril (PRINIVIL,ZESTRIL) 5 MG tablet Take 2.5 mg by mouth every evening.   4  . metoprolol tartrate (LOPRESSOR) 25 MG tablet Take 25 mg by mouth daily as needed (high heart rate).   7  . Multiple Vitamins-Minerals (PRESERVISION AREDS 2) CAPS Take 1 capsule by mouth 2 (two) times daily.    . nitroGLYCERIN (NITROSTAT) 0.4 MG SL tablet Place 0.4 mg under the tongue every 5 (five) minutes x 3 doses as needed for chest pain.     Marland Kitchen ondansetron (ZOFRAN ODT) 4 MG disintegrating tablet Take 1 tablet (4 mg total) by mouth every 8 (eight) hours as needed. (Patient taking differently: Take 4 mg by mouth every 8 (eight) hours as needed for nausea or vomiting. ) 10 tablet 1    . Polyethyl Glycol-Propyl Glycol (SYSTANE OP) Apply 1 drop to eye 2 (two) times daily.    . vitamin C (ASCORBIC ACID) 500 MG tablet Take 1,000 mg by mouth at bedtime.     . vitamin E 1000 UNIT capsule Take 1,000 Units by mouth at bedtime.        Physical Exam: Blood pressure 99/79, pulse 89, temperature 98.9 F (37.2 C), temperature source Oral, resp. rate 15, height '5\' 3"'$  (1.6 m), weight 68.4 kg (150 lb 12.7 oz), SpO2 98 %. General: sedated ill-appearing white female who is laying in bed in NAD secondary to pain meds HEENT: head is normocephalic, atraumatic.  Sclera are noninjected.  PERRL.  Ears and nose without any masses or lesions.  Mouth is pink and dry Heart: regular, rate, and rhythm.  Normal s1,s2. No obvious murmurs, gallops, or rubs noted.  Palpable radial and pedal pulses bilaterally Lungs: diffuse wheezing with inspiration and expiration.  On O2 sating 96%.  Respiratory effort is nonlabored. Abd: soft, but diffusely tender with peritoneal signs.  Greatest tenderness is in the lower abdomen, but she is tender all over. Few BS, but generally hypoactive. +guarding. MS: all 4 extremities are symmetrical with no cyanosis, clubbing, or edema. Skin: warm and dry with no masses, lesions, or rashes Psych: Arouses when her name is called, but goes back to sleep.   Results for orders placed or performed during the hospital encounter of 09/26/17 (from the past 48 hour(s))  Glucose, capillary     Status: Abnormal   Collection Time: 09/27/17  5:38 PM  Result Value Ref Range   Glucose-Capillary 59 (L) 65 - 99 mg/dL   Comment 1 Notify RN    Comment 2 Document in Chart   Glucose, capillary     Status: Abnormal   Collection Time: 09/27/17  6:07 PM  Result Value Ref Range   Glucose-Capillary 133 (H) 65 - 99 mg/dL  Glucose, capillary     Status: Abnormal   Collection Time: 09/27/17  8:25 PM  Result Value Ref Range   Glucose-Capillary 118 (H) 65 - 99 mg/dL   Comment 1 Notify RN    Glucose, capillary     Status: Abnormal   Collection Time: 09/28/17 12:48 AM  Result Value Ref Range   Glucose-Capillary 120 (H) 65 - 99 mg/dL   Comment 1 Notify RN   Basic metabolic panel     Status: Abnormal   Collection Time: 09/28/17  4:12 AM  Result Value Ref Range   Sodium 136 135 - 145 mmol/L   Potassium 4.2 3.5 - 5.1 mmol/L   Chloride 103 101 - 111 mmol/L   CO2 22 22 - 32 mmol/L   Glucose, Bld 136 (H) 65 - 99 mg/dL   BUN 7 6 - 20 mg/dL   Creatinine, Ser 0.86 0.44 - 1.00 mg/dL   Calcium 8.1 (L) 8.9 - 10.3 mg/dL   GFR calc non Af Amer >60 >60 mL/min   GFR calc Af Amer >60 >60 mL/min    Comment: (NOTE) The eGFR has been calculated using the CKD EPI equation. This calculation has not been validated in all clinical situations. eGFR's persistently <60 mL/min signify possible Chronic Kidney Disease.    Anion gap 11 5 - 15    Comment: Performed at [facility], [address] [place], [place] [address]  CBC     Status: Abnormal   Collection Time: 09/28/17  4:12 AM  Result Value Ref Range   WBC 18.8 (H) 4.0 - 10.5 K/uL   RBC 3.61 (L) 3.87 - 5.11 MIL/uL   Hemoglobin 10.7 (L) 12.0 - 15.0 g/dL   HCT 34.3 (L) 36.0 - 46.0 %   MCV 95.0 78.0 - 100.0 fL   MCH 29.6 26.0 - 34.0 pg   MCHC 31.2 30.0 - 36.0 g/dL   RDW 13.5 11.5 - 15.5 %   Platelets 343 150 - 400 K/uL    Comment: Performed at [facility], [address], [place], [place] [address]  Glucose, capillary     Status: Abnormal   Collection Time: 09/28/17  6:01 AM  Result Value Ref Range   Glucose-Capillary 135 (H) 65 - 99 mg/dL  Comment 1 Notify RN   Glucose, capillary     Status: Abnormal   Collection Time: 09/28/17  7:40 AM  Result Value Ref Range   Glucose-Capillary 152 (H) 65 - 99 mg/dL  Magnesium     Status: None   Collection Time: 09/28/17  8:48 AM  Result Value Ref Range   Magnesium 1.8 1.7 - 2.4 mg/dL    Comment: Performed at [facility], [address], [place], [place] [address]  Troponin I (q 6hr x 3)      Status: Abnormal   Collection Time: 09/28/17  8:48 AM  Result Value Ref Range   Troponin I 0.03 (HH) <0.03 ng/mL    Comment: CRITICAL RESULT CALLED TO, READ BACK BY AND VERIFIED WITH: MOTLEY,J AT 9:20AM ON 09/28/17 BY [facility] Performed at [facility], [address], [place], [place] [address]   Glucose, capillary     Status: Abnormal   Collection Time: 09/28/17 12:10 PM  Result Value Ref Range   Glucose-Capillary 142 (H) 65 - 99 mg/dL  Troponin I (q 6hr x 3)     Status: Abnormal   Collection Time: 09/28/17  2:19 PM  Result Value Ref Range   Troponin I 0.36 (HH) <0.03 ng/mL    Comment: CRITICAL RESULT CALLED TO, READ BACK BY AND VERIFIED WITH: KOGER,L ON 09/28/17 AT 1530 BY LOY,C Performed at [facility], [address], [place], [place] [address]   Glucose, capillary     Status: Abnormal   Collection Time: 09/28/17  5:12 PM  Result Value Ref Range   Glucose-Capillary 195 (H) 65 - 99 mg/dL  Glucose, capillary     Status: Abnormal   Collection Time: 09/28/17 11:36 PM  Result Value Ref Range   Glucose-Capillary 183 (H) 65 - 99 mg/dL   Comment 1 Capillary Specimen    Comment 2 Notify RN   CBC     Status: Abnormal   Collection Time: 09/29/17  5:30 AM  Result Value Ref Range   WBC 19.2 (H) 4.0 - 10.5 K/uL   RBC 3.68 (L) 3.87 - 5.11 MIL/uL   Hemoglobin 10.7 (L) 12.0 - 15.0 g/dL   HCT 34.8 (L) 36.0 - 46.0 %   MCV 94.6 78.0 - 100.0 fL   MCH 29.1 26.0 - 34.0 pg   MCHC 30.7 30.0 - 36.0 g/dL   RDW 13.9 11.5 - 15.5 %   Platelets 399 150 - 400 K/uL    Comment: Performed at [facility], [place] [address] [place], [place] [address]  Basic metabolic panel     Status: Abnormal   Collection Time: 09/29/17  5:30 AM  Result Value Ref Range   Sodium 139 135 - 145 mmol/L   Potassium 4.7 3.5 - 5.1 mmol/L   Chloride 107 101 - 111 mmol/L   CO2 14 (L) 22 - 32 mmol/L   Glucose, Bld 262 (H) 65 - 99 mg/dL   BUN 11 6 - 20 mg/dL   Creatinine, Ser 1.51 (H) 0.44 - 1.00 mg/dL   Calcium 8.0  (L) 8.9 - 10.3 mg/dL   GFR calc non Af Amer 33 (L) >60 mL/min   GFR calc Af Amer 38 (L) >60 mL/min    Comment: (NOTE) The eGFR has been calculated using the CKD EPI equation. This calculation has not been validated in all clinical situations. eGFR's persistently <60 mL/min signify possible Chronic Kidney Disease.    Anion gap 18 (H) 5 - 15    Comment: Performed at [facility], [place] [address], [place], [place] [address]  Glucose, capillary     Status: Abnormal   Collection Time: 09/29/17  6:50 AM  Result Value Ref Range   Glucose-Capillary 285 (H) 65 - 99 mg/dL  Glucose, capillary     Status: Abnormal   Collection Time: 09/29/17  8:14 AM  Result Value Ref Range   Glucose-Capillary 328 (H) 65 - 99 mg/dL   Comment 1 Capillary Specimen    Comment 2 Notify RN   Magnesium     Status: Abnormal   Collection Time: 09/29/17  8:52 AM  Result Value Ref Range   Magnesium 2.6 (H) 1.7 - 2.4 mg/dL    Comment: Performed at [facility] [address] [place], [place] [address]  Glucose, capillary     Status: Abnormal   Collection Time: 09/29/17 12:09 PM  Result Value Ref Range   Glucose-Capillary 324 (H) 65 - 99 mg/dL   Comment 1 Capillary Specimen    No results found.    Assessment/Plan Septic shock secondary to perforated diverticulitis The patient's CT scan has been reviewed with Dr. Redmond Pulling.  The patient has also been evaluated and appears to have sepsis as well as an acute abdomen secondary to perforated diverticulitis.  She is currently on Merrem. Plan will be to go to the OR for ex lap with Hartman's and an open wound to prevent wound infection.  This procedure has been discussed with her and her family; however, due to her sedation, I'm not sure how much she understands or remembers.  The procedure including risks and complications such as but not limited to bleeding, infection, bowel injury, problems with the colostomy, re-exploration, hernia, MI, CVA, VDRF, and death have  been discussed.  The family understands and is agreeable to proceed.  SVT, s/p cardioversion The patient required cardioversion yesterday for her SVT.  She is on amiodarone and lopressor currently to control this.  Cardiology has evaluated her here and given clearance.  She did have a normal EF on ECHO yesterday.  She had a negative cath 10+yrs ago and negative myoview etc about 3 years ago.  AKI -creatinine has double to 1.5 today from 0.8 yesterday.  This is likely secondary to her septic shock from the above.  Cont fluid resuscitation.  Hypotension  Secondary to shock.  Have asked CCM to go ahead and see the patient given her current status and what she may be like postoperatively.  Tobacco abuse The patient has no diagnosed history of emphysema or COPD.  She has diffuse wheezing currently.  She may require the ventilator postoperatively due to worsening of her respiratory status from surgery, etc.  CVA HTN hypoerlipidemia DM Chronic back pain   Henreitta Cea, [facility] 09/29/2017, 12:24 PM Pager: [phone]

## 2017-09-29 NOTE — Progress Notes (Signed)
Inpatient Diabetes Program Recommendations  AACE/ADA: New Consensus Statement on Inpatient Glycemic Control (2015)  Target Ranges:  Prepandial:   less than 140 mg/dL      Peak postprandial:   less than 180 mg/dL (1-2 hours)      Critically ill patients:  140 - 180 mg/dL   Lab Results  Component Value Date   GLUCAP 328 (H) 09/29/2017   HGBA1C 6.4 (H) 09/27/2017    Review of Glycemic ControlResults for KORIANNA, WASHER (MRN [id number]) as of 09/29/2017 09:53  Ref. Range 09/28/2017 12:10 09/28/2017 17:12 09/28/2017 23:36 09/29/2017 06:50 09/29/2017 08:14  Glucose-Capillary Latest Ref Range: 65 - 99 mg/dL 142 (H) 195 (H) 183 (H) 285 (H) 328 (H)  Diabetes history: DM2 Outpatient Diabetes medications: Lantus 20 units QAM, Novolin R 2-10 units TID Current orders for Inpatient glycemic control: Novolog 0-5 units q HS?  Inpatient Diabetes Program Recommendations: Note patient transferred 5/8 [facility].  Blood sugars have increased overnight.  Please start ICU glycemic control order set- Phase 2/ IV insulin.    Thanks, Adah Perl, RN, BC-ADM Inpatient Diabetes Coordinator Pager [phone] (8a-5p)

## 2017-09-29 NOTE — Progress Notes (Signed)
Patient Demographics:    Barbara [name], is a 77 y.o. female, DOB - 1940/11/14, HCW:[id number]  Admit date - 09/26/2017   Admitting Physician Oswald Hillock, MD  Outpatient Primary MD for the patient is Asencion Noble, MD  LOS - 2   Chief Complaint  Patient presents with  . Abdominal Pain        Subjective:    Barbara [name] today has no fevers, no emesis,  No chest pain, no dizziness, patient somewhat lethargic from time to time with occasional disorientation after pain medications and benzos, husband and daughter at bedside, questions answered  Assessment  & Plan :    Principal Problem:   Diverticulitis of both large and small intestine with perforation and abscess without bleeding Active Problems:   Hypertension   Diabetes mellitus ([place])   Atrial fibrillation with RVR ([place])   Colonic diverticular abscess   Wide Complex Tachycardia/H/o SVT and H/o Afib   Brief Narrative:   Barbara [name] a66 y.o.female,with a history of diabetes mellitus, hypertension who was recently admitted to the [facility] with sigmoid diverticulitis and associated abscess on 09/23/17   and discharged on Cipro and Flagyl on 09/25/2017.  Readmitted 09/27/2017,  Her CT scan in the ED demonstrated diverticular perforation , started on IV Merrem on 09/27/17,  She was going to have partial colectomy and colostomy on 09/28/17 Dr. Aviva Signs, but unfortunately had an episode of wide-complex tachycardia that was unstable with systolic blood pressure in the 80 range.  She was cardioverted with a single synchronized 200 J shock to sinus rhythm by cardiology Dr. Harl Bowie.  She was subsequently initiated on amiodarone drip with recommendation to hold off operative intervention with further EP evaluation at [facility].  Transfer to [facility], currently being evaluated by Dr. Greer Pickerel from general surgery, Dr. Nelda Marseille from PCCM, and EP  cardiology team.  Plan is for colectomy with colostomy on 09/29/2017 after preop clearance from cardiology and PCCM   Plan:-  1)Sigmoid Diverticulitis with perforation and abscess-leukocytosis (19.2)  persist, abdominal pain and abd tenderness persists,c/n  IV meropenem,  Plan is for colectomy with colostomy on 09/29/2017 after preop clearance from cardiology and PCCM  2)Nonsustained wide-complex tachycardia-patient has history of paroxysmal A. fib and history of SVT, on 09/28/17 she had hypotension/hemodynamic instability,  with systolic blood pressure in the 80 range.  She was cardioverted with a single synchronized 200 J shock to sinus rhythm by cardiology Dr. Harl Bowie on 09/28/17.   She did not lose her pulse or become unresponsive.   Awaiting further cardiology and EP evaluation on 09/29/2017.   2D echocardiogram with LVEF 65 to 70% and no new wall motion abnormalities.    Continue IV amiodarone, monitor electrolytes closely   3)H/o PAfib and PSVT-currently not on anticoagulation due to significant GI bleed on anticoagulants (NOAC) previously.  Prior to admission patient was on metoprolol and flecainide.  Avoid abrupt discontinuation of metoprolol, patient is currently n.p.o., may use IV metoprolol per parameters  4)DM- Allow some permissive Hyperglycemia rather than risk life-threatening hypoglycemia in a patient with unreliable oral intake.  Hold Lantus insulin, use modified Novolo Sliding scale insulin with Accu-Cheks/Fingersticks as ordered  5)HTN-hold lisinopril due to acute kidney injury, until patient is euvolemic  and  perioperative volume issues are resolved, may use IV metoprolol as ordered with parameters  6)AKI----acute kidney injury , no prior history of significant CKD, GFR on 09/23/2017 was over 60 ml/min     creatinine on admission= 0.9  ,   baseline creatinine = 0.8   , creatinine is now= 1.51 , acute kidney injury due to  hypotension and dehydration , hold lisinopril,   Avoid nephrotoxic  agents/dehydration/hypotension    DVT prophylaxis: SCD Code Status: Full Family Communication: Daughter and Husband at bedside Disposition Plan:  TBD  Consultants:   GS-Dr. Ernestine Conrad  Cardiology Dr. Neta Mends Cardiology  PCCM  Procedures:   Synchronized Cardioversion 09/28/2017  Antimicrobials:   Cipro/Flagyl 5/7-5/8  Merrem 5/8->   Lab Results  Component Value Date   PLT 399 09/29/2017    Inpatient Medications  Scheduled Meds: . brimonidine  1 drop Both Eyes TID  . Chlorhexidine Gluconate Cloth  6 each Topical Daily  . insulin aspart  0-5 Units Subcutaneous QHS  . mouth rinse  15 mL Mouth Rinse BID  . metoprolol tartrate  5 mg Intravenous Q8H  . sodium chloride flush  10-40 mL Intracatheter Q12H   Continuous Infusions: . amiodarone 30 mg/hr (09/29/17 1200)  . lactated ringers 1,000 mL (09/29/17 1234)  . lactated ringers    . meropenem (MERREM) IV 1 g (09/29/17 1232)   PRN Meds:.acetaminophen **OR** acetaminophen, HYDROmorphone (DILAUDID) injection, LORazepam, ondansetron **OR** ondansetron (ZOFRAN) IV, sodium chloride flush    Anti-infectives (From admission, onward)   Start     Dose/Rate Route Frequency Ordered Stop   09/27/17 1200  meropenem (MERREM) 1 g in sodium chloride 0.9 % 100 mL IVPB     1 g 200 mL/hr over 30 Minutes Intravenous Every 12 hours 09/27/17 1032     09/27/17 1000  ciprofloxacin (CIPRO) IVPB 400 mg  Status:  Discontinued     400 mg 200 mL/hr over 60 Minutes Intravenous Every 12 hours 09/27/17 0547 09/27/17 1029   09/27/17 0800  metroNIDAZOLE (FLAGYL) IVPB 500 mg  Status:  Discontinued     500 mg 100 mL/hr over 60 Minutes Intravenous Every 8 hours 09/27/17 0547 09/27/17 1030   09/26/17 2315  ciprofloxacin (CIPRO) IVPB 400 mg     400 mg 200 mL/hr over 60 Minutes Intravenous  Once 09/26/17 2304 09/27/17 0032   09/26/17 2315  metroNIDAZOLE (FLAGYL) IVPB 500 mg     500 mg 100 mL/hr over 60 Minutes Intravenous  Once  09/26/17 2304 09/27/17 0229        Objective:   Vitals:   09/29/17 1000 09/29/17 1100 09/29/17 1200 09/29/17 1204  BP: (!) 106/46 99/79    Pulse: (!) 104 (!) 101 89   Resp: (!) 29 19 15    Temp:    98.9 F (37.2 C)  TempSrc:    Oral  SpO2: 94% 96% 98%   Weight:      Height:        Wt Readings from Last 3 Encounters:  09/29/17 68.4 kg (150 lb 12.7 oz)  09/24/17 62.7 kg (138 lb 4.8 oz)  09/20/17 62.1 kg (137 lb)     Intake/Output Summary (Last 24 hours) at 09/29/2017 1245 Last data filed at 09/29/2017 1200 Gross per 24 hour  Intake 3469.61 ml  Output 300 ml  Net 3169.61 ml     Physical Exam  Gen:- Awake Alert, intermittently disoriented after benzos and opiates HEENT:- [place].AT, No sclera icterus Neck-Supple Neck,No JVD,.  Lungs-diminished in  bases, no wheezing CV- S1, S2 normal, irregular but not irregularly irregular Abd- generalized abdominal tenderness, diminished bowel sounds  extremity/Skin:- No  edema,   good pulses Psych- intermittently disoriented after benzos and opiates Neuro-no new focal deficits, no tremors   Data Review:   Micro Results Recent Results (from the past 240 hour(s))  Urine culture     Status: None   Collection Time: 09/26/17 10:31 PM  Result Value Ref Range Status   Specimen Description   Final    URINE, CLEAN CATCH Performed at [facility], [address] [place], [place] [address]    Special Requests   Final    NONE Performed at [facility], [address], [place], [place] [address]    Culture   Final    NO GROWTH Performed at [facility], [place] [address], [place], [place] [address]    Report Status 09/28/2017 FINAL  Final  MRSA PCR Screening     Status: None   Collection Time: 09/27/17  5:48 AM  Result Value Ref Range Status   MRSA by PCR NEGATIVE NEGATIVE Final    Comment:        The GeneXpert MRSA Assay (FDA approved for NASAL specimens only), is one component of a comprehensive MRSA colonization surveillance  program. It is not intended to diagnose MRSA infection nor to guide or monitor treatment for MRSA infections. Performed at [facility], [address], [place], [place] [address]     Radiology Reports Ct Abdomen Pelvis W Contrast  Result Date: 09/27/2017 CLINICAL DATA:  77 y/o  F; lower abdominal pain. EXAM: CT ABDOMEN AND PELVIS WITH CONTRAST TECHNIQUE: Multidetector CT imaging of the abdomen and pelvis was performed using the standard protocol following bolus administration of intravenous contrast. CONTRAST:  133mL ISOVUE-300 IOPAMIDOL (ISOVUE-300) INJECTION 61% COMPARISON:  09/23/2017 CT abdomen and pelvis FINDINGS: Lower chest: No acute abnormality. Hepatobiliary: No focal liver abnormality is seen. No gallstones, gallbladder wall thickening, or biliary dilatation. Pancreas: Unremarkable. No pancreatic ductal dilatation or surrounding inflammatory changes. Spleen: Normal in size without focal abnormality. Adrenals/Urinary Tract: Adrenal glands are unremarkable. Kidneys are normal, without renal calculi, focal lesion, or hydronephrosis. Bladder is unremarkable. Stomach/Bowel: Acute perforated diverticulitis (series 5, image 49) with oblong fluid collection extending between the bladder and the uterus measuring 7.1 cm in length and 1.8 cm in diameter (series 2, image 67 and series 5, image 45), increased in size in comparison with the prior CT of the abdomen and pelvis. Contrast and debris is present within the perforated collection. Stomach and small bowel are unremarkable. Vascular/Lymphatic: Aortic atherosclerosis. No enlarged abdominal or pelvic lymph nodes. Reproductive: Uterus and bilateral adnexa are unremarkable. Other: No abdominal wall hernia or abnormality. No abdominopelvic ascites. Musculoskeletal: No acute or significant osseous findings. IMPRESSION: Acute perforated diverticulitis. Pericolonic abscess extending between the bladder and uterus has increased in size and surrounding  inflammation. Contrast and debris extends into the abscess cavity. Electronically Signed   By: Kristine Garbe M.D.   On: 09/27/2017 00:34   Ct Abdomen Pelvis W Contrast  Result Date: 09/23/2017 CLINICAL DATA:  Abdominal pain, diverticulitis suspected EXAM: CT ABDOMEN AND PELVIS WITH CONTRAST TECHNIQUE: Multidetector CT imaging of the abdomen and pelvis was performed using the standard protocol following bolus administration of intravenous contrast. CONTRAST:  169mL ISOVUE-300 IOPAMIDOL (ISOVUE-300) INJECTION 61% COMPARISON:  CT abdomen/pelvis dated 09/10/2017. FINDINGS: Lower chest: Lung bases are clear. Hepatobiliary: Liver is within normal limits. Gallbladder is unremarkable. No intrahepatic or extrahepatic ductal  dilatation. Pancreas: Within normal limits. Spleen: Within normal limits. Adrenals/Urinary Tract: Adrenal glands are within normal limits. Kidneys within normal limits.  No hydronephrosis. Mildly thick-walled bladder. Stomach/Bowel: Stomach is within normal limits. No evidence of bowel obstruction. Prior appendectomy. Sigmoid diverticulosis, with mild pericolonic inflammatory changes in the left lower quadrant, reflecting acute diverticulitis. Associated 18 x 12 mm fluid/gas collection inferior to the sigmoid colon (series 2/image 60), suggesting a small pericolonic abscess, although this does not reflect a drainable fluid collection. No free air. Vascular/Lymphatic: No evidence of abdominal aortic aneurysm. Atherosclerotic calcifications of the abdominal aorta and branch vessels. No suspicious abdominopelvic lymphadenopathy. Reproductive: Uterus is within normal limits. Bilateral ovaries are within normal limits. Other: No abdominopelvic ascites. Musculoskeletal: Mild degenerative changes at L5-S1. IMPRESSION: Acute diverticulitis. 1.8 cm fluid/gas collection adjacent to the sigmoid colon, suggesting a small pericolonic abscess, although this does not reflect a drainable collection. No free  air. Electronically Signed   By: Julian Hy M.D.   On: 09/23/2017 20:51   Ct Abdomen Pelvis W Contrast  Result Date: 09/18/2017 CLINICAL DATA:  Left lower abdomen and suprapubic pain beginning this morning. EXAM: CT ABDOMEN AND PELVIS WITH CONTRAST TECHNIQUE: Multidetector CT imaging of the abdomen and pelvis was performed using the standard protocol following bolus administration of intravenous contrast. CONTRAST:  120mL ISOVUE-300 IOPAMIDOL (ISOVUE-300) INJECTION 61% COMPARISON:  11/30/2011 FINDINGS: Lower chest: No acute abnormality. Hepatobiliary: No focal liver abnormality is seen. No gallstones, gallbladder wall thickening, or biliary dilatation. Pancreas: Unremarkable. No pancreatic ductal dilatation or surrounding inflammatory changes. Spleen: Normal in size without focal abnormality. Adrenals/Urinary Tract: Adrenal glands are unremarkable. Kidneys are normal, without renal calculi, focal lesion, or hydronephrosis. Bladder is unremarkable. Stomach/Bowel: Stomach and small bowel unremarkable. There are numerous left colon diverticula. No diverticulitis or other colonic inflammatory process. Vascular/Lymphatic: There is prominent aortic atherosclerosis extending to the iliac vessels. No pathologically enlarged lymph nodes. Reproductive: Uterus and bilateral adnexa are unremarkable. Other: No abdominal wall hernia or abnormality. No abdominopelvic ascites. Musculoskeletal: No fracture or acute finding. No osteoblastic or osteolytic lesions. IMPRESSION: 1. No acute findings. 2. Numerous left colon diverticula. No CT evidence of diverticulitis. 3. Fairly extensive aortic atherosclerosis. Electronically Signed   By: Lajean Manes M.D.   On: 09/18/2017 20:49     CBC Recent Labs  Lab 09/23/17 1852 09/24/17 0632 09/26/17 2207 09/28/17 0412 09/29/17 0530  WBC 8.7 9.1 14.1* 18.8* 19.2*  HGB 11.4* 11.8* 11.3* 10.7* 10.7*  HCT 34.6* 36.0 35.1* 34.3* 34.8*  PLT 290 286 346 343 399  MCV 91.8 92.5  90.5 95.0 94.6  MCH 30.2 30.3 29.1 29.6 29.1  MCHC 32.9 32.8 32.2 31.2 30.7  RDW 13.0 13.1 13.0 13.5 13.9  LYMPHSABS 1.7  --  1.1  --   --   MONOABS 1.1*  --  1.3*  --   --   EOSABS 0.4  --  0.2  --   --   BASOSABS 0.1  --  0.0  --   --     Chemistries  Recent Labs  Lab 09/23/17 1852 09/24/17 6712 09/26/17 2207 09/27/17 0609 09/28/17 0412 09/28/17 0848 09/29/17 0530 09/29/17 0852  NA 136 134* 134* 135 136  --  139  --   K 3.9 4.0 3.9 4.4 4.2  --  4.7  --   CL 100* 99* 96* 100* 103  --  107  --   CO2 25 24 24 25 22   --  14*  --   GLUCOSE 50*  105* 74 183* 136*  --  262*  --   BUN 9 7 11 8 7   --  11  --   CREATININE 0.79 0.90 0.98 0.95 0.86  --  1.51*  --   CALCIUM 8.9 8.6* 9.0 8.1* 8.1*  --  8.0*  --   MG  --   --   --   --   --  1.8  --  2.6*  AST 24  --  27 24  --   --   --   --   ALT 19  --  19 16  --   --   --   --   ALKPHOS 123  --  100 87  --   --   --   --   BILITOT 0.5  --  0.7 0.7  --   --   --   --    ------------------------------------------------------------------------------------------------------------------ No results for input(s): CHOL, HDL, LDLCALC, TRIG, CHOLHDL, LDLDIRECT in the last 72 hours.  Lab Results  Component Value Date   HGBA1C 6.4 (H) 09/27/2017   ------------------------------------------------------------------------------------------------------------------ Recent Labs    09/26/17 2203  TSH 7.417*   ------------------------------------------------------------------------------------------------------------------ No results for input(s): VITAMINB12, FOLATE, FERRITIN, TIBC, IRON, RETICCTPCT in the last 72 hours.  Coagulation profile Recent Labs  Lab 09/27/17 0658  INR 1.03    No results for input(s): DDIMER in the last 72 hours.  Cardiac Enzymes Recent Labs  Lab 09/26/17 2207 09/28/17 0848 09/28/17 1419  TROPONINI <0.03 0.03* 0.36*    ------------------------------------------------------------------------------------------------------------------ No results found for: BNP   Roxan Hockey M.D on 09/29/2017 at 12:45 PM  Between 7am to 7pm - Pager - [phone]  After 7pm go to [email or web address] - password [email or web address]  [facility] -  Office  [phone]   Voice Recognition Viviann Spare dictation system was used to create this note, attempts have been made to correct errors. Please contact the author with questions and/or clarifications.   /`

## 2017-09-29 NOTE — Anesthesia Preprocedure Evaluation (Addendum)
Anesthesia Evaluation  Patient identified by MRN, date of birth, ID band Patient awake    Reviewed: Allergy & Precautions, NPO status , Patient's Chart, lab work & pertinent test results  Airway Mallampati: III  TM Distance: >3 FB Neck ROM: Full    Dental  (+) Partial Lower   Pulmonary asthma , Current Smoker,    Pulmonary exam normal breath sounds clear to auscultation       Cardiovascular hypertension, Pt. on medications and Pt. on home beta blockers + CAD  Normal cardiovascular exam+ dysrhythmias Atrial Fibrillation  Rhythm:Regular Rate:Normal  ECG: ST, rate 105  Sees cardiologist Caryl Comes)  ECHO: Left ventricle: The cavity size was normal. Wall thickness was increased in a pattern of mild LVH. Systolic function was vigorous. The estimated ejection fraction was in the range of 65% to 70%. Wall motion was normal; there were no regional wall motion abnormalities. Left ventricular diastolic function parameters were normal. Aortic valve: Valve area (VTI): 1.96 cm^2. Valve area (Vmax): 1.96 cm^2. Valve area (Vmean): 1.87 cm^2. Mitral valve: There was mild regurgitation. Left atrium: The atrium was moderately dilated. Right atrium: The atrium was mildly dilated. Atrial septum: No defect or patent foramen ovale was identified. Pulmonary arteries: Systolic pressure was mildly increased. Technically adequate study.  Stress Test There was no ST segment deviation noted during stress.   Neuro/Psych CVA negative psych ROS   GI/Hepatic negative GI ROS, Neg liver ROS,   Endo/Other  diabetes, Insulin Dependent  Renal/GU negative Renal ROS     Musculoskeletal Chronic back pain   Abdominal   Peds  Hematology  (+) anemia , HLD   Anesthesia Other Findings diverticulitis of small and large intestine abdominal abscess  Reproductive/Obstetrics                            Anesthesia Physical Anesthesia  Plan  ASA: IV and emergent  Anesthesia Plan: General   Post-op Pain Management:    Induction: Intravenous  PONV Risk Score and Plan: 2 and Ondansetron, Dexamethasone and Treatment may vary due to age or medical condition  Airway Management Planned: Oral ETT  Additional Equipment: Arterial line  Intra-op Plan:   Post-operative Plan: Possible Post-op intubation/ventilation  Informed Consent: I have reviewed the patients History and Physical, chart, labs and discussed the procedure including the risks, benefits and alternatives for the proposed anesthesia with the patient or authorized representative who has indicated his/her understanding and acceptance.   Dental advisory given  Plan Discussed with: CRNA  Anesthesia Plan Comments:      Anesthesia Quick Evaluation

## 2017-09-29 NOTE — Op Note (Signed)
NAME: DANASHIA, LANDERS MEDICAL RECORD ZO:[id number] ACCOUNT [id number] DATE OF BIRTH:22-Mar-1941 FACILITY: [facility] LOCATION: [facility] PHYSICIAN:Taos Tapp Ronnie Derby, MD  OPERATIVE REPORT  DATE OF PROCEDURE:  09/29/2017  PREOPERATIVE DIAGNOSIS:  Perforated sigmoid diverticulitis with intraabdominal abscess.  POSTOPERATIVE DIAGNOSIS:  Perforated sigmoid diverticulitis with intraabdominal abscess.  PROCEDURE: 1.  Exploratory laparotomy. 2.  Left colectomy and sigmoid colectomy with end transverse colon colostomy (Hartmann's procedure). 3.  Drainage of intraabdominal abscess. 4.  Mobilization of splenic flexure.  SURGEON:  Leighton Ruff. Redmond Pulling, MD FACS  ASSISTANT:  Erroll Luna, MD FACS  ANESTHESIA:  General.  ESTIMATED BLOOD LOSS:  100 mL.  BLOOD ADMINISTERED:  1 unit.  SPECIMENS: 1.  Sigmoid colon, stitch marks proximal margin. 2.  Left/descending:  Stitch marks proximal margin.  INDICATIONS FOR PROCEDURE:  The patient is a 77 year old female who had been having abdominal pain for the last 2 weeks.  She had been seen at [facility] several times.  She went back to the emergency room and was found to have diverticulitis  with a small abscess.  She was admitted for a few days but discharged home.  She returned shortly thereafter for worsening pain.  A repeat CT scan revealed an acutely perforated diverticulitis with increased fluid collection.  Contrast flowed into the  fluid collection.  It was not amenable to percutaneous drainage.  The plan was for her to go to the operating room at [facility], but she developed SVT and hypotension requiring cardioversion and was placed on amiodarone and Lopressor and was transferred  to [facility] for cardiac and surgical care.  Upon our evaluation, the patient was hypotensive and somewhat lethargic.  She had acute kidney injury.  She had peritonitis on exam.  I had a prolonged conversation with the patient's husband and  daughter  regarding risks and benefits of the procedure including but not limited to bleeding, infection, injury to surrounding structures, abscess, blood clot formation, incisional hernia, dehiscence, colostomy issues such as retraction, ischemia,  parastomal hernia, worsening sepsis, kidney failure, respiratory failure, fistula formation, death.  We also talked about the typical recovery should the patient survive this hospitalization.  They elected for [place] to take her to the operating room.  She  had been seen by critical care medicine prior to taking to the operating room.  She had been started on broad-spectrum IV antibiotics.  She was taken urgently to operating room 10 at [facility] and placed supine on the operating room table.   General endotracheal anesthesia was established.  Sequential compression devices had been placed.  A Foley catheter was placed.  Her abdomen was prepped and draped in the usual standard surgical fashion with ChloraPrep.  A surgical timeout was performed.   Initially, a periumbilical incision was made extending a few inches below to a few inches above the umbilicus with a 10 blade.  Subcutaneous tissue was divided with electrocautery.  The fascia was incised, and the abdominal cavity was entered.  There  was a frank immediate drainage of enteric contents as well as a large amount of pus.  There was extensive exudate over multiple loops of small bowel.  The small bowel was already becoming dilated.  A Balfour retractor was placed.  We identified the area  of perforation in the sigmoid colon.  There was a fair amount of induration, and we identified the hole in the sigmoid colon.  We packed the small bowel up out of the pelvis to expose the sigmoid colon.  I identified an area in the upper rectum for  transection.  A defect was made in the mesentery just next to the upper rectum.  I then stapled across the upper rectum with a contour stapler with a green load stapler.  We then  started to take down the mesentery of the proximal sigmoid colon in  sequential fashion using the LigaSure device.  Her sigmoid colon and descending colon mesentery was very foreshortened.  It became apparent that we needed to extend our incision superiorly.  Despite trying to pack the small bowel out of the way, we just  did not have enough room with the [facility] retractor.  A Bookwalter retractor was obtained and set up in typical fashion.  She had a deep abdomen, and it was challenging to mobilize her descending colon.  She had a large amount of omentum adhered to the  left pericolic gutter as well as in the descending and the left upper quadrant.  We mobilized some of this omentum with a combination of LigaSure and Bovie electrocautery.  I decided I found an area of descending colon to transect about an inch above the  area of perforation because it appeared that it was going to be very challenging to mobilize the splenic flexure since her splenic flexure was very high.  The colon was transected with another fire of a contour stapler with a blue load.  We continued to  try to mobilize the descending colon, but it became obvious that we would have to take additional mesentery down, which would compromise the descending colon and the colostomy viability.  We decided to take the omentum off the transverse colon and  mobilized the splenic flexure.  The midline incision was extended more.  It took about 45 minutes to mobilize the proximal left colon and splenic flexure.  This was done in a combination of finger dissection along with right angle as well as with  LigaSure device.  We were able to get in the lesser sac.  We were able to come from the splenic flexure from above and below.  It became obvious that we were not going to be able to bring out the descending colon as a colostomy because the mesentery was  still just too shortened without compromising the blood flow.  Therefore, we decided to do a left  colectomy and bring out the end of the transverse colon as an end colostomy in the left upper quadrant.  This bowel was actually more  normal-appearing.  It was not indurated.  There was no inflammation of the colon in this location.  The distal transverse colon was stapled off with a blue load of the contour stapler.  We then took down the remaining descending colon mesentery in  sequential fashion using the LigaSure device.  This freed the descending colon.  It was passed off the field.  At this point, we irrigated the abdomen with 4 L of saline.  A circular skin defect was made in the left upper quadrant with the Bovie  electrocautery.  The subcutaneous tissue was spread.  The anterior fascia was incised in a cruciate fashion.  The rectus muscle was spread with a Claiborne Billings.  The posterior fascia was incised.  It accommodated 2 fingers.  I then brought out the end transverse  colon easily through the left upper quadrant.  We had tagged the rectal stump with 2 Prolene sutures, one on each end of the staple line.  The staple line appeared viable.  The abdomen was closed with a #1 looped PDS, 1 from above and 1 from below.   Moist Kerlix was placed in the midline followed by dry gauze and tape.  The end colostomy was then matured.  The staple line was excised with electrocautery.  I then placed multiple interrupted 3-0 Vicryl sutures between the colon and the deep dermis to  mature the colostomy.  It was widely patent.  Ostomy appliance was then placed.  All needle, instrument and sponge counts were correct x2.  There were no immediate complications.  The patient remained off vasopressors at the end of the case.  She did  receive a unit of blood during surgery.  Discussed the surgical findings with the patient's family in the consult room.  LN/NUANCE  D:09/29/2017 T:09/29/2017 JOB:[id number]

## 2017-09-29 NOTE — Anesthesia Postprocedure Evaluation (Signed)
Anesthesia Post Note  Patient: Barbara [name]  Procedure(s) Performed: EXPLORATORY LAPAROTOMY (N/A ) EXTENDED LEFT HEMI COLECTOMY WITH COLOSTOMY CREATION (HARTMANN PROCEDURE) (Left Abdomen)     Patient location during evaluation: PACU Anesthesia Type: General Level of consciousness: sedated Pain management: pain level controlled Vital Signs Assessment: post-procedure vital signs reviewed and stable Respiratory status: spontaneous breathing and respiratory function stable Cardiovascular status: stable Postop Assessment: no apparent nausea or vomiting Anesthetic complications: no    Last Vitals:  Vitals:   09/29/17 1912 09/29/17 1925  BP: (!) 145/50 (!) 137/48  Pulse: 93 89  Resp: (!) 27 20  Temp:    SpO2: 96% 97%    Last Pain:  Vitals:   09/29/17 1925  TempSrc:   PainSc: [place]

## 2017-09-29 NOTE — Anesthesia Procedure Notes (Signed)
Procedure Name: Intubation Date/Time: 09/29/2017 4:22 PM Performed by: Lance Coon, CRNA Pre-anesthesia Checklist: Patient identified, Emergency Drugs available, Suction available, Patient being monitored and Timeout performed Patient Re-evaluated:Patient Re-evaluated prior to induction Oxygen Delivery Method: Circle system utilized Preoxygenation: Pre-oxygenation with 100% oxygen Induction Type: Rapid sequence, Cricoid Pressure applied and IV induction Laryngoscope Size: Miller and 3 Grade View: Grade I Tube type: Oral Tube size: 7.5 mm Number of attempts: 1 Airway Equipment and Method: Stylet Placement Confirmation: ETT inserted through vocal cords under direct vision,  positive ETCO2 and breath sounds checked- equal and bilateral Secured at: 21 cm Tube secured with: Tape Dental Injury: Teeth and Oropharynx as per pre-operative assessment

## 2017-09-29 NOTE — Anesthesia Procedure Notes (Signed)
Arterial Line Insertion Start/End5/01/2018 3:55 PM, 09/29/2017 4:05 PM Performed by: Lance Coon, CRNA, CRNA  Preanesthetic checklist: patient identified, IV checked, site marked, risks and benefits discussed, surgical consent, monitors and equipment checked, pre-op evaluation, timeout performed and anesthesia consent Lidocaine 1% used for infiltration Left, radial was placed Catheter size: 20 G Hand hygiene performed , maximum sterile barriers used  and Seldinger technique used  Attempts: 1 Procedure performed without using ultrasound guided technique. Following insertion, dressing applied and Biopatch. Post procedure assessment: normal  Patient tolerated the procedure well with no immediate complications.

## 2017-09-29 NOTE — Consult Note (Addendum)
PULMONARY / CRITICAL CARE MEDICINE   Name: Barbara [name] MRN: [id number] DOB: Jul 18, 1940    ADMISSION DATE:  09/26/2017 CONSULTATION DATE:  rapid wide-complex tachycardia  REFERRING MD:  Redmond Pulling  CHIEF COMPLAINT:  Sepsis and septic shock   HISTORY OF PRESENT ILLNESS:   77 year old female with prior history of perforated sigmoid diverticulitis.  Was actually discharged on 5/6 on antibiotics and analgesia.  Over the following 24 hours developed worsening abdominal pain, nausea and vomiting.  Repeat abdomen CT demonstrated larger abdominal abscess with cavitation.  She had rising white blood cell count as well as hypotension which initially was responsive to fluid.  She also has a history of paroxysmal atrial fibrillation but not on anticoagulation due to prior GI bleeding.  She is maintained on flecainide.  The a.m. 5/9 demonstrated complex tachycardia with a rate in the 200s requiring synchronized cardioversion.  She is now on amiodarone infusion, over the morning she has had intermittent hypotension and because of her severe sepsis critical care has been asked to consult  PAST MEDICAL HISTORY :  She  has a past medical history of Arteriosclerotic cardiovascular disease (ASCVD), Asthma, Chest pain, Chronic back pain, Diabetes mellitus, GERD (gastroesophageal reflux disease), Hyperlipidemia, Hypertension, Macular degeneration, Palpitations, Stroke [facility]) (1993 2008), and Tobacco abuse.  PAST SURGICAL HISTORY: She  has a past surgical history that includes Cataract extraction; Appendectomy; Tubal ligation; Colonoscopy (2009); Esophagogastroduodenoscopy (10/21/2011); Colonoscopy (03/30/2012); Colonoscopy (N/A, 12/13/2013); and Esophagogastroduodenoscopy (N/A, 12/13/2013).  Allergies  Allergen Reactions  . Bee Venom Anaphylaxis  . Sulfamethoxazole Nausea And Vomiting  . Penicillins Itching and Rash    Has patient had a PCN reaction causing immediate rash, facial/tongue/throat swelling, SOB or  lightheadedness with hypotension: Nono Has patient had a PCN reaction causing severe rash involving mucus membranes or skin necrosis: {Yes/No:[id number] yes a rash Has patient had a PCN reaction that required hospitalization Nono Has patient had a PCN reaction occurring within the last 10 years: Nono If all of the above answers are "NO", then may proceed with Cephalo  . Sulfonamide Derivatives Nausea And Vomiting    No current facility-administered medications on file prior to encounter.    Current Outpatient Medications on File Prior to Encounter  Medication Sig  . aspirin EC 325 MG tablet Take 325 mg by mouth every evening.  . B Complex Vitamins (VITAMIN B COMPLEX PO) Take 1 tablet by mouth daily.  . Bevacizumab (AVASTIN) 100 MG/4ML SOLN Inject 1.25 mg into the vein every 8 (eight) weeks.  . brimonidine (ALPHAGAN) 0.2 % ophthalmic solution Place 1 drop into both eyes 3 (three) times daily.   . Calcium Carbonate Antacid (TUMS E-X SUGAR FREE PO) Take 2 tablets by mouth every morning.  . Cholecalciferol (VITAMIN D) 1000 UNITS capsule Take 1,000 Units by mouth every morning.   . flecainide (TAMBOCOR) 100 MG tablet TAKE 1 TABLET BY MOUTH TWICE DAILY  . insulin glargine (LANTUS) 100 UNIT/ML injection Inject 20 Units into the skin every morning.   . insulin regular (NOVOLIN R,HUMULIN R) 100 units/mL injection Inject 2-10 Units into the skin 3 (three) times daily as needed for high blood sugar (per sliding scale). SLIDING SCALE   . lisinopril (PRINIVIL,ZESTRIL) 5 MG tablet Take 2.5 mg by mouth every evening.   . metoprolol tartrate (LOPRESSOR) 25 MG tablet Take 25 mg by mouth daily as needed (high heart rate).   . Multiple Vitamins-Minerals (PRESERVISION AREDS 2) CAPS Take 1 capsule by mouth 2 (two) times daily.  . nitroGLYCERIN (NITROSTAT) 0.4 MG  SL tablet Place 0.4 mg under the tongue every 5 (five) minutes x 3 doses as needed for chest pain.   Marland Kitchen ondansetron (ZOFRAN ODT) 4 MG disintegrating tablet  Take 1 tablet (4 mg total) by mouth every 8 (eight) hours as needed. (Patient taking differently: Take 4 mg by mouth every 8 (eight) hours as needed for nausea or vomiting. )  . Polyethyl Glycol-Propyl Glycol (SYSTANE OP) Apply 1 drop to eye 2 (two) times daily.  . vitamin C (ASCORBIC ACID) 500 MG tablet Take 1,000 mg by mouth at bedtime.   . vitamin E 1000 UNIT capsule Take 1,000 Units by mouth at bedtime.     FAMILY HISTORY:  Her indicated that her mother is deceased. She indicated that her father is deceased. She indicated that only one of her four sisters is alive. She indicated that her brother is deceased. She indicated that her daughter is alive. She indicated that her other is alive.   SOCIAL HISTORY: She  reports that she has been smoking cigarettes.  She has a 3.75 pack-year smoking history. She has never used smokeless tobacco. She reports that she does not drink alcohol or use drugs.  REVIEW OF SYSTEMS:   Complains of abdominal pain, denies shortness of breath, chest pain, nausea or vomiting  SUBJECTIVE:  Hypotensive, mottled, hyperglycemic  VITAL SIGNS: Blood Pressure 99/79   Pulse 89   Temperature 98.9 F (37.2 C) (Oral)   Respiration 15   Height 5\' 3"  (1.6 m)   Weight 150 lb 12.7 oz (68.4 kg)   Oxygen Saturation 98%   Body Mass Index 26.71 kg/m   HEMODYNAMICS:    VENTILATOR SETTINGS:    INTAKE / OUTPUT: I/O last 3 completed shifts: In: 4286.1 [I.V.:4286.1] Out: 550 [Urine:550]  PHYSICAL EXAMINATION: General: Pleasant 77 year old female currently resting comfortably in bed she does have abdominal discomfort the pain Neuro: Awake, alert, no focal deficits HEENT: Cephalic atraumatic mucous membranes moist no jugular venous distention Cardiovascular: Regular rate and rhythm without murmur rub or gallop Lungs: Clear to auscultation diminished bases Abdomen: Distended, firm, painful to palpation with rebound tenderness Musculoskeletal: Equal strength and bulk  brisk cap refill no edema Skin: Slightly mottled lower extremities  LABS:  BMET Recent Labs  Lab 09/27/17 0609 09/28/17 0412 09/29/17 0530  NA 135 136 139  K 4.4 4.2 4.7  CL 100* 103 107  CO2 25 22 14*  BUN 8 7 11   CREATININE 0.95 0.86 1.51*  GLUCOSE 183* 136* 262*    Electrolytes Recent Labs  Lab 09/27/17 0609 09/28/17 0412 09/28/17 0848 09/29/17 0530 09/29/17 0852  CALCIUM 8.1* 8.1*  --  8.0*  --   MG  --   --  1.8  --  2.6*    CBC Recent Labs  Lab 09/26/17 2207 09/28/17 0412 09/29/17 0530  WBC 14.1* 18.8* 19.2*  HGB 11.3* 10.7* 10.7*  HCT 35.1* 34.3* 34.8*  PLT 346 343 399    Coag's Recent Labs  Lab 09/27/17 0658  INR 1.03    Sepsis Markers Recent Labs  Lab 09/26/17 2207 09/27/17 0029  LATICACIDVEN 1.5 1.0    ABG No results for input(s): PHART, PCO2ART, PO2ART in the last 168 hours.  Liver Enzymes Recent Labs  Lab 09/23/17 1852 09/26/17 2207 09/27/17 0609  AST 24 27 24   ALT 19 19 16   ALKPHOS 123 100 87  BILITOT 0.5 0.7 0.7  ALBUMIN 3.4* 3.5 2.9*    Cardiac Enzymes Recent Labs  Lab 09/26/17 2207 09/28/17 0848  09/28/17 1419  TROPONINI <0.03 0.03* 0.36*    Glucose Recent Labs  Lab 09/28/17 1210 09/28/17 1712 09/28/17 2336 09/29/17 0650 09/29/17 0814 09/29/17 1209  GLUCAP 142* 195* 183* 285* 328* 324*    Imaging No results found.   STUDIES:  CT abdomen 5/7: Acute perforated diverticulitis. Pericolonic abscess extending between the bladder and uterus has increased in size and surrounding inflammation. Contrast and debris extends into the abscess cavity.  CULTURES: Urine culture 5/6 was negative  ANTIBIOTICS: 5/7 meropenem>>> Had been on antibiotics since 5/3 prior.   SIGNIFICANT EVENTS:   LINES/TUBES: Right upper extremity PICC line  DISCUSSION: Perforated diverticulitis with abscess, resultant septic shock and mods.  Currently volume resuscitating.  On antibiotics.  Has significant anion gap  metabolic acidosis I am concerned about either lactic acidosis or diabetic ketoacidosis given hyperglycemia we will check a beta hydroxybutyric acid as well as lactic acid.  If the lactic acid is not very elevated I think we should initiate insulin drip.  We will continue aggressive volume resuscitation  ASSESSMENT / PLAN:  Severe sepsis, septic shock in the setting of perforated diverticulitis with new abscess Plan Day # 3 meropenem  LR 1 liter bolus w/ MIVFs at 125 Ck LA Agree she needs surgical interventions    WCT w/ h/o PAF w/ h/o CAD->required cardioversion X 1 -EF 65-70%; No new WM abnormality  Plan Seen by cards amio per cards Cont flecainide and lopressor, may need to hold Lopressor if remains hypotensive Tele monitoring   AKI w/ AG metabolic acidosis I am concerned with intermittent hypotension and non-anion gap acidosis she may still have an adequate endorgan perfusion, also consider diabetic ketoacidosis in the setting given severe hyperglycemia and anion gap acidosis Plan Fluid challenge Renal dose medications Renal dose medications Strict intake and output Arterial blood gas now Check beta-hydroxybutyric acid  Anemia of critical illness Plan Serial CBCs PAS for now, subcu heparin when okay with surgery Transfuse for hemoglobin less than 7 or evidence of active bleeding  DVT prophylaxis: scds SUP: PPI   Diet: NPO Activity: BR Disposition : surgery    FAMILY stated at length at the bedside  My critical care x45 minutes  Erick Colace ACNP-BC [place] Pager # [phone] OR # [phone] if no answer  Attending Note:  77 year old female with diverticulosis history who presents to PCCM acidotic and with AMS.  On exam, she is confused but following commands with clear lungs.  I reviewed abdominal CT myself, free air noted with abscess consistent with diverticular bleed.  Discussed with surgery, will take to the OR for laparotomy and  repair of perf and evacuation of abscess.  Will place on broad spectrum abx.  To the OR today.  Will likely return intubated.  Pressor for BP support.  Beta-hydroxybutarate is consistent with DKA so will start IVF and DKA protocol.  PCCM will continue to follow.  Appreciate assistance from surgery.  The patient is critically ill with multiple organ systems failure and requires high complexity decision making for assessment and support, frequent evaluation and titration of therapies, application of advanced monitoring technologies and extensive interpretation of multiple databases.   Critical Care Time devoted to patient care services described in this note is  37  Minutes. This time reflects time of care of this signee Dr Jennet Maduro. This critical care time does not reflect procedure time, or teaching time or supervisory time of PA/NP/Med student/Med Resident etc but could involve care discussion time.  Rush Farmer, M.D. [facility] Pulmonary/Critical Care Medicine. Pager: [phone]. After hours pager: [phone].  09/29/2017, 12:29 PM

## 2017-09-30 ENCOUNTER — Encounter (HOSPITAL_COMMUNITY)
Admission: EM | Disposition: A | Discharge status: Home / Self Care | Payer: Self-pay | Source: Home / Self Care | Attending: Internal Medicine

## 2017-09-30 ENCOUNTER — Encounter (HOSPITAL_COMMUNITY): Payer: Self-pay | Admitting: General Surgery

## 2017-09-30 ENCOUNTER — Inpatient Hospital Stay (HOSPITAL_COMMUNITY): Payer: Medicare Other

## 2017-09-30 DIAGNOSIS — E1110 Type 2 diabetes mellitus with ketoacidosis without coma: Secondary | ICD-10-CM

## 2017-09-30 DIAGNOSIS — E1310 Other specified diabetes mellitus with ketoacidosis without coma: Secondary | ICD-10-CM

## 2017-09-30 LAB — BASIC METABOLIC PANEL
Anion gap: 8 (ref 5–15)
Anion gap: 9 (ref 5–15)
Anion gap: 7 (ref 5–15)
BUN: 18 mg/dL (ref 6–20)
BUN: 17 mg/dL (ref 6–20)
BUN: 16 mg/dL (ref 6–20)
CHLORIDE: 110 mmol/L (ref 101–111)
CHLORIDE: 108 mmol/L (ref 101–111)
CO2: 23 mmol/L (ref 22–32)
CO2: 24 mmol/L (ref 22–32)
CO2: 25 mmol/L (ref 22–32)
CREATININE: 1.17 mg/dL — AB (ref 0.44–1.00)
CREATININE: 1.04 mg/dL — AB (ref 0.44–1.00)
Calcium: 7.4 mg/dL — ABNORMAL LOW (ref 8.9–10.3)
Calcium: 7.4 mg/dL — ABNORMAL LOW (ref 8.9–10.3)
Calcium: 7.5 mg/dL — ABNORMAL LOW (ref 8.9–10.3)
Chloride: 110 mmol/L (ref 101–111)
Creatinine, Ser: 1.2 mg/dL — ABNORMAL HIGH (ref 0.44–1.00)
GFR calc Af Amer: 60 mL/min — ABNORMAL LOW (ref >60)
GFR calc non Af Amer: 43 mL/min — ABNORMAL LOW (ref >60)
GFR calc non Af Amer: 45 mL/min — ABNORMAL LOW (ref >60)
GFR calc non Af Amer: 52 mL/min — ABNORMAL LOW (ref >60)
GFR, EST AFRICAN AMERICAN: 50 mL/min — AB (ref >60)
GFR, EST AFRICAN AMERICAN: 52 mL/min — AB (ref >60)
GLUCOSE: 212 mg/dL — AB (ref 65–99)
GLUCOSE: 176 mg/dL — AB (ref 65–99)
Glucose, Bld: 194 mg/dL — ABNORMAL HIGH (ref 65–99)
POTASSIUM: 4.1 mmol/L (ref 3.5–5.1)
Potassium: 3.9 mmol/L (ref 3.5–5.1)
Potassium: 4 mmol/L (ref 3.5–5.1)
SODIUM: 141 mmol/L (ref 135–145)
SODIUM: 141 mmol/L (ref 135–145)
SODIUM: 142 mmol/L (ref 135–145)

## 2017-09-30 LAB — GLUCOSE, CAPILLARY
GLUCOSE-CAPILLARY: 137 mg/dL — AB (ref 65–99)
GLUCOSE-CAPILLARY: 170 mg/dL — AB (ref 65–99)
GLUCOSE-CAPILLARY: 173 mg/dL — AB (ref 65–99)
GLUCOSE-CAPILLARY: 238 mg/dL — AB (ref 65–99)
GLUCOSE-CAPILLARY: 54 mg/dL — AB (ref 65–99)
Glucose-Capillary: 157 mg/dL — ABNORMAL HIGH (ref 65–99)
Glucose-Capillary: 189 mg/dL — ABNORMAL HIGH (ref 65–99)
Glucose-Capillary: 138 mg/dL — ABNORMAL HIGH (ref 65–99)
Glucose-Capillary: 187 mg/dL — ABNORMAL HIGH (ref 65–99)
Glucose-Capillary: 202 mg/dL — ABNORMAL HIGH (ref 65–99)
Glucose-Capillary: 196 mg/dL — ABNORMAL HIGH (ref 65–99)
Glucose-Capillary: 212 mg/dL — ABNORMAL HIGH (ref 65–99)
Glucose-Capillary: 164 mg/dL — ABNORMAL HIGH (ref 65–99)
Glucose-Capillary: 161 mg/dL — ABNORMAL HIGH (ref 65–99)
Glucose-Capillary: 217 mg/dL — ABNORMAL HIGH (ref 65–99)

## 2017-09-30 LAB — CBC
HEMATOCRIT: 33.5 % — AB (ref 36.0–46.0)
Hemoglobin: 11 g/dL — ABNORMAL LOW (ref 12.0–15.0)
MCH: 29.4 pg (ref 26.0–34.0)
MCHC: 32.8 g/dL (ref 30.0–36.0)
MCV: 89.6 fL (ref 78.0–100.0)
Platelets: 356 10*3/uL (ref 150–400)
RBC: 3.74 MIL/uL — ABNORMAL LOW (ref 3.87–5.11)
RDW: 13.7 % (ref 11.5–15.5)
WBC: 16.6 10*3/uL — ABNORMAL HIGH (ref 4.0–10.5)

## 2017-09-30 LAB — PHOSPHORUS: Phosphorus: 1.4 mg/dL — ABNORMAL LOW (ref 2.5–4.6)

## 2017-09-30 LAB — MAGNESIUM: Magnesium: 2.1 mg/dL (ref 1.7–2.4)

## 2017-09-30 SURGERY — COLECTOMY, PARTIAL
Anesthesia: General

## 2017-09-30 MED ORDER — WHITE PETROLATUM EX OINT: TOPICAL_OINTMENT | CUTANEOUS | Status: DC | PRN

## 2017-09-30 MED ORDER — INSULIN GLARGINE 100 UNIT/ML ~~LOC~~ SOLN
5.0000 [IU] | Freq: Every day | SUBCUTANEOUS | Status: AC
  Administered 2017-09-30 – 2017-10-01 (×2): 5 [IU] via SUBCUTANEOUS
  Filled 2017-09-30 (×2): qty 0.05

## 2017-09-30 MED ORDER — WHITE PETROLATUM EX OINT
TOPICAL_OINTMENT | CUTANEOUS | Status: AC
Start: 2017-09-30 — End: 2017-09-30
  Administered 2017-09-30: 0.2
  Filled 2017-09-30: qty 28.35

## 2017-09-30 MED ORDER — METOPROLOL TARTRATE 5 MG/5ML IV SOLN
5.0000 mg | INTRAVENOUS | Status: AC
  Administered 2017-09-30: 5 mg via INTRAVENOUS

## 2017-09-30 MED ORDER — METOPROLOL TARTRATE 5 MG/5ML IV SOLN
INTRAVENOUS | Status: AC
  Filled 2017-09-30: qty 5

## 2017-09-30 MED ORDER — METHOCARBAMOL 1000 MG/10ML IJ SOLN
500.0000 mg | Freq: Three times a day (TID) | INTRAVENOUS | Status: DC | PRN
  Filled 2017-09-30: qty 5

## 2017-09-30 MED ORDER — HYDROMORPHONE HCL 1 MG/ML IJ SOLN
0.5000 mg | INTRAMUSCULAR | Status: DC | PRN
  Administered 2017-09-30 – 2017-10-01 (×2): 0.5 mg via INTRAVENOUS
  Administered 2017-10-01: 1 mg via INTRAVENOUS
  Administered 2017-10-01: 0.5 mg via INTRAVENOUS
  Administered 2017-10-01: 1 mg via INTRAVENOUS
  Filled 2017-09-30: qty 0.5
  Filled 2017-09-30: qty 1
  Filled 2017-09-30: qty 0.5
  Filled 2017-09-30 (×3): qty 1

## 2017-09-30 MED ORDER — DEXTROSE-NACL 5-0.45 % IV SOLN
INTRAVENOUS | Status: DC
  Administered 2017-10-01 – 2017-10-09 (×4): via INTRAVENOUS

## 2017-09-30 MED ORDER — METOPROLOL TARTRATE 5 MG/5ML IV SOLN
2.5000 mg | Freq: Four times a day (QID) | INTRAVENOUS | Status: DC
  Administered 2017-09-30 – 2017-10-05 (×19): 2.5 mg via INTRAVENOUS
  Filled 2017-09-30 (×22): qty 5

## 2017-09-30 MED ORDER — HYDROMORPHONE HCL 1 MG/ML IJ SOLN
1.0000 mg | INTRAMUSCULAR | Status: DC | PRN
  Administered 2017-09-30 (×5): 1 mg via INTRAVENOUS
  Filled 2017-09-30 (×7): qty 1

## 2017-09-30 MED ORDER — INSULIN ASPART 100 UNIT/ML ~~LOC~~ SOLN
2.0000 [IU] | SUBCUTANEOUS | Status: DC
  Administered 2017-09-30: 6 [IU] via SUBCUTANEOUS
  Administered 2017-09-30: 4 [IU] via SUBCUTANEOUS
  Administered 2017-09-30: 6 [IU] via SUBCUTANEOUS
  Administered 2017-10-01 (×3): 4 [IU] via SUBCUTANEOUS

## 2017-09-30 NOTE — Progress Notes (Signed)
Pharmacy Antibiotic Note  JANAIYAH BLACKARD is a 77 y.o. female admitted on 09/26/2017 with intra-abdominal infection.  Pharmacy has been consulted for MEROPENEM dosing. Penicillin allergy noted. Pt is afebrile and WBC is elevated but trending down. Scr has improved to 1.04. Dose remains appropriate.   Plan: Continue meropenem 1gm IV Q12H F/u renal fxn, C&S, clinical status   Height: 5\' 3"  (160 cm) Weight: 150 lb 12.7 oz (68.4 kg) IBW/kg (Calculated) : 52.4  Temp (24hrs), Avg:98.2 F (36.8 C), Min:97.6 F (36.4 C), Max:98.9 F (37.2 C)  Recent Labs  Lab 09/24/17 2841 09/26/17 2207 09/27/17 0029  09/28/17 0412 09/29/17 0530 09/29/17 1300 09/29/17 2119 09/30/17 0132 09/30/17 0538 09/30/17 0938  WBC 9.1 14.1*  --   --  18.8* 19.2*  --   --   --  16.6*  --   CREATININE 0.90 0.98  --    < > 0.86 1.51*  --  1.19* 1.20* 1.17* 1.04*  LATICACIDVEN  --  1.5 1.0  --   --   --  1.5  --   --   --   --    < > = values in this interval not displayed.    Estimated Creatinine Clearance: 44.1 mL/min (A) (by C-G formula based on SCr of 1.04 mg/dL (H)).    Antimicrobials this admission: Cipro and Flagyl 5/7 >> 5/7 Meropenem 5/7 >>   Microbiology results: 5/6 UCx: MRSA 5/7 MRSA PCR: MRSA  Thank you for allowing pharmacy to be a part of this patient's care.  Maytte Jacot, Rande Lawman 09/30/2017 11:25 AM

## 2017-09-30 NOTE — Progress Notes (Signed)
PULMONARY / CRITICAL CARE MEDICINE   Name: Barbara [name] MRN: [id number] DOB: 1940/10/21    ADMISSION DATE:  09/26/2017 CONSULTATION DATE:  rapid wide-complex tachycardia  REFERRING MD:  Redmond Pulling  CHIEF COMPLAINT:  Sepsis and septic shock   HISTORY OF PRESENT ILLNESS:   77 year old female with prior history of perforated sigmoid diverticulitis.  Was actually discharged on 5/6 on antibiotics and analgesia.  Over the following 24 hours developed worsening abdominal pain, nausea and vomiting.  Repeat abdomen CT demonstrated larger abdominal abscess with cavitation.  She had rising white blood cell count as well as hypotension which initially was responsive to fluid.  She also has a history of paroxysmal atrial fibrillation but not on anticoagulation due to prior GI bleeding.  She is maintained on flecainide.  The a.m. 5/9 demonstrated complex tachycardia with a rate in the 200s requiring synchronized cardioversion.  She is now on amiodarone infusion, over the morning she has had intermittent hypotension and because of her severe sepsis critical care has been asked to consult  SUBJECTIVE:  Postop, hemodynamically stable.  Renal function improved.  Has developed some mild delirium since surgery also mild increase in oxygen need otherwise doing well  VITAL SIGNS: Blood Pressure (Abnormal) 148/105   Pulse 94   Temperature 98.1 F (36.7 C) (Oral)   Respiration 19   Height 5\' 3"  (1.6 m)   Weight 150 lb 12.7 oz (68.4 kg)   Oxygen Saturation 95%   Body Mass Index 26.71 kg/m   Liters nasal cannula HEMODYNAMICS: CVP:  [13 mmHg] 13 mmHg  VENTILATOR SETTINGS:    INTAKE / OUTPUT: I/O last 3 completed shifts: In: 7824 [I.V.:6659; Blood:315; IV Piggyback:850] Out: 3614 [Urine:595; Emesis/NG output:50; Blood:500]  PHYSICAL EXAMINATION: General: 78 year old white female currently resting in bed.  She is more confused today than preoperatively but appropriate at times and certainly  cooperative HEENT normocephalic atraumatic.  Nasogastric tube in place with bilious drainage. Pulmonary: Scattered wheeze and rhonchi, no accessory use currently. Cardiac: Regular rate and rhythm Abdomen: Soft, tender to palpation but improved compared to yesterday.  Abdominal incision clean dry and intact.  Ostomy unremarkable with brisk capillary return Extremities: Warm and dry no significant edema Neuro: Awake and verbal but confused at times moving all extremities without focal deficits  LABS:  BMET Recent Labs  Lab 09/30/17 0132 09/30/17 0538 09/30/17 0938  NA 141 141 142  K 3.9 4.1 4.0  CL 110 108 110  CO2 23 24 25   BUN 18 17 16   CREATININE 1.20* 1.17* 1.04*  GLUCOSE 194* 212* 176*    Electrolytes Recent Labs  Lab 09/28/17 0848  09/29/17 0852  09/30/17 0132 09/30/17 0538 09/30/17 4315  CALCIUM  --    < >  --    < > 7.4* 7.4* 7.5*  MG 1.8  --  2.6*  --   --   --   --    < > = values in this interval not displayed.    CBC Recent Labs  Lab 09/28/17 0412 09/29/17 0530  09/29/17 1729 09/29/17 1804 09/30/17 0538  WBC 18.8* 19.2*  --   --   --  16.6*  HGB 10.7* 10.7*   < > 9.9* 8.8* 11.0*  HCT 34.3* 34.8*   < > 29.0* 26.0* 33.5*  PLT 343 399  --   --   --  356   < > = values in this interval not displayed.    [place]  Lab 09/27/17 [phone]  INR 1.03    Sepsis Markers Recent Labs  Lab 09/26/17 2207 09/27/17 0029 09/29/17 1300  LATICACIDVEN 1.5 1.0 1.5    ABG Recent Labs  Lab 09/29/17 1644 09/29/17 1729 09/29/17 1804  PHART 7.257* 7.334* 7.363  PCO2ART 38.6 38.8 39.8  PO2ART 243.0* 112.0* 112.0*    Liver Enzymes Recent Labs  Lab 09/23/17 1852 09/26/17 2207 09/27/17 0609  AST 24 27 24   ALT 19 19 16   ALKPHOS 123 100 87  BILITOT 0.5 0.7 0.7  ALBUMIN 3.4* 3.5 2.9*    Cardiac Enzymes Recent Labs  Lab 09/26/17 2207 09/28/17 0848 09/28/17 1419  TROPONINI <0.03 0.03* 0.36*    Glucose Recent Labs  Lab 09/30/17 0354  09/30/17 0549 09/30/17 0659 09/30/17 0807 09/30/17 0935 09/30/17 1041  GLUCAP 138* 202* 187* 196* 173* 164*    Imaging No results found.   STUDIES:  CT abdomen 5/7: Acute perforated diverticulitis. Pericolonic abscess extending between the bladder and uterus has increased in size and surrounding inflammation. Contrast and debris extends into the abscess cavity.  CULTURES: Urine culture 5/6 was negative  ANTIBIOTICS: 5/7 meropenem>>> Had been on antibiotics since 5/3 prior.   SIGNIFICANT EVENTS:   LINES/TUBES: Right upper extremity PICC line  DISCUSSION: Perforated diverticulitis with abscess, resultant septic shock and mods as well as DKA.  She is now status post exploratory laparotomy with sigmoid colectomy and transverse colectomy.  Hemodynamically stable anion gap is closed from a DKA standpoint renal functions normalized.  She does have some mild delirium and hypoxia today however ultimately seems to be doing well  ASSESSMENT / PLAN:  Severe sepsis, septic shock in the setting of perforated diverticulitis with new abscess, now postoperative day #1 for exploratory lap with sigmoid colectomy and transverse and colostomy on 5/9 -Shock resolved -There is marked abdominal contamination per surgical note Plan Day #4 meropenem Discontinue arterial line Keep euvolemic N.p.o./bowel rest Wound care and ostomy care per surgical services  Mild hypoxia with right greater than left pulmonary infiltrates appears to be a mix of both basilar atelectasis and pulmonary edema Plan Mobilize Decreasing IV fluids Adding incentive spirometry Reevaluate later today, may need to diurese her  All the acute encephalopathy Plan Supportive care Careful with narcotics  WCT w/ h/o PAF w/ h/o CAD->required cardioversion X 1 -EF 65-70%; No new WM abnormality  Plan Seen by cardiology, with plan to transition to p.o. amiodarone once we can use her gut Continue telemetry  monitoring  Diabetic ketoacidosis: Anion gap is closed. Plan Continue DKA protocol with goal to transition to sliding scale Change LR to D5 half-normal at 50 cc an hour Keep n.p.o.  AKI in the setting of sepsis.  Creatinine improving/normalized. Plan Avoid hypotension, trend chemistries Renal dose medications as appropriate  Anemia of critical illness -No evidence of postoperative bleeding Plan Trend CBC Subcu heparin   DVT prophylaxis: sub-Cutaneous heparin SUP: PPI   Diet: NPO Activity: BR Disposition : surgery    FAMILY stated at length at the bedside  Erick Colace ACNP-BC [place] Pager # [phone] OR # [phone] if no answer    09/30/2017, 11:00 AM

## 2017-09-30 NOTE — Progress Notes (Signed)
Inpatient Diabetes Program Recommendations  AACE/ADA: New Consensus Statement on Inpatient Glycemic Control (2015)  Target Ranges:  Prepandial:   less than 140 mg/dL      Peak postprandial:   less than 180 mg/dL (1-2 hours)      Critically ill patients:  140 - 180 mg/dL   Lab Results  Component Value Date   GLUCAP 196 (H) 09/30/2017   HGBA1C 6.4 (H) 09/27/2017    Review of Glycemic ControlResults for [name], [name] (MRN [id number]) as of 09/30/2017 09:19  Ref. Range 09/30/2017 02:42 09/30/2017 03:54 09/30/2017 05:49 09/30/2017 06:59 09/30/2017 08:07  Glucose-Capillary Latest Ref Range: 65 - 99 mg/dL 170 (H) 138 (H) 202 (H) 187 (H) 196 (H)   Diabetes history: Type 2 DM  Outpatient Diabetes medications:  Lantus 20 units q AM, Novolin R 2-10 units tid with meals Current orders for Inpatient glycemic control:  IV insulin  Inpatient Diabetes Program Recommendations:  Note mild low overnight.  May consider increasing rate of Dextrose IV due to lows?   Thanks,  Barbara Perl, RN, BC-ADM Inpatient Diabetes Coordinator Pager [phone] (8a-5p)

## 2017-09-30 NOTE — Progress Notes (Signed)
Inpatient Rehabilitation  Per PT request, patient was screened by Gunnar Fusi for appropriateness for an Inpatient Acute Rehab consult.  At this time we are recommending an OT order as well as an order for an Inpatient Rehab consult.  MD please order if you are agreeable.    Carmelia Roller., CCC/SLP Admission Coordinator  [place]  Cell [phone]

## 2017-09-30 NOTE — Progress Notes (Addendum)
Progress Note  Patient Name: Barbara [name] Date of Encounter: 09/30/2017  Primary Cardiologist: Dr. Harl Bowie Electrophysiologist; Dr. Caryl Comes  Subjective   + post-op pain  Inpatient Medications    Scheduled Meds: . brimonidine  1 drop Both Eyes TID  . Chlorhexidine Gluconate Cloth  6 each Topical Daily  . heparin injection (subcutaneous)  5,000 Units Subcutaneous Q8H  . mouth rinse  15 mL Mouth Rinse BID   Continuous Infusions: . amiodarone 30 mg/hr (09/30/17 0551)  . dextrose 5 % and 0.45% NaCl 10 mL/hr at 09/30/17 0001  . insulin (NOVOLIN-R) infusion 2.3 Units/hr (09/30/17 9211)  . lactated ringers 125 mL/hr at 09/30/17 0550  . meropenem (MERREM) IV Stopped (09/29/17 2249)  . methocarbamol (ROBAXIN)  IV     PRN Meds: acetaminophen **OR** acetaminophen, HYDROmorphone (DILAUDID) injection, LORazepam, methocarbamol (ROBAXIN)  IV, ondansetron **OR** ondansetron (ZOFRAN) IV, sodium chloride flush   Vital Signs    Vitals:   09/30/17 0700 09/30/17 0753 09/30/17 0800 09/30/17 0900  BP: (!) 151/52  (!) 148/55 (!) 166/58  Pulse: 100  97 94  Resp: (!) 29  (!) 28 (!) 33  Temp:  98.1 F (36.7 C)    TempSrc:  Oral    SpO2: 96%  94% 95%  Weight:      Height:        Intake/Output Summary (Last 24 hours) at 09/30/2017 0941 Last data filed at 09/30/2017 0800 Gross per 24 hour  Intake 4857.53 ml  Output 845 ml  Net 4012.53 ml   Filed Weights   09/27/17 0545 09/28/17 0500 09/29/17 0437  Weight: 142 lb 13.7 oz (64.8 kg) 145 lb 15.1 oz (66.2 kg) 150 lb 12.7 oz (68.4 kg)    Telemetry    SR, 90's - Personally Reviewed  ECG     No new EKGs - Personally Reviewed  Physical Exam   GEN: awake, moaning in pain, not does appear in acute disctress.   Neck: No JVD Cardiac: RRR, no murmurs, rubs, or gallops.  Respiratory: scaterred rhonchi b/l. GI: not examined, post op day #1, dressing is clean, colostomy in place  MS: No edema; No deformity. Neuro:  no apparent defecits    Psych: not assess  Labs    Chemistry Recent Labs  Lab 09/23/17 1852  09/26/17 2207 09/27/17 0609  09/29/17 2119 09/30/17 0132 09/30/17 0538  NA 136   < > 134* 135   < > 142 141 141  K 3.9   < > 3.9 4.4   < > 3.5 3.9 4.1  CL 100*   < > 96* 100*   < > 110 110 108  CO2 25   < > 24 25   < > 25 23 24   GLUCOSE 50*   < > 74 183*   < > 150* 194* 212*  BUN 9   < > 11 8   < > 20 18 17   CREATININE 0.79   < > 0.98 0.95   < > 1.19* 1.20* 1.17*  CALCIUM 8.9   < > 9.0 8.1*   < > 7.4* 7.4* 7.4*  PROT 6.7  --  6.9 5.9*  --   --   --   --   ALBUMIN 3.4*  --  3.5 2.9*  --   --   --   --   AST 24  --  27 24  --   --   --   --   ALT 19  --  19  16  --   --   --   --   ALKPHOS 123  --  100 87  --   --   --   --   BILITOT 0.5  --  0.7 0.7  --   --   --   --   GFRNONAA >60   < > 55* 58*   < > 44* 43* 45*  GFRAA >60   < > >60 >60   < > 51* 50* 52*  ANIONGAP 11   < > 14 10   < > 7 8 9    < > = values in this interval not displayed.     Hematology Recent Labs  Lab 09/28/17 0412 09/29/17 0530  09/29/17 1729 09/29/17 1804 09/30/17 0538  WBC 18.8* 19.2*  --   --   --  16.6*  RBC 3.61* 3.68*  --   --   --  3.74*  HGB 10.7* 10.7*   < > 9.9* 8.8* 11.0*  HCT 34.3* 34.8*   < > 29.0* 26.0* 33.5*  MCV 95.0 94.6  --   --   --  89.6  MCH 29.6 29.1  --   --   --  29.4  MCHC 31.2 30.7  --   --   --  32.8  RDW 13.5 13.9  --   --   --  13.7  PLT 343 399  --   --   --  356   < > = values in this interval not displayed.    Cardiac Enzymes Recent Labs  Lab 09/26/17 2207 09/28/17 0848 09/28/17 1419  TROPONINI <0.03 0.03* 0.36*   No results for input(s): TROPIPOC in the last 168 hours.   BNPNo results for input(s): BNP, PROBNP in the last 168 hours.   DDimer No results for input(s): DDIMER in the last 168 hours.   Radiology    No results found.  Cardiac Studies   09/28/17: TTE Study Conclusions - Left ventricle: The cavity size was normal. Wall thickness was increased in a pattern of mild  LVH. Systolic function was vigorous. The estimated ejection fraction was in the range of 65% to 70%. Wall motion was normal; there were no regional wall motion abnormalities. Left ventricular diastolic function parameters were normal. - Aortic valve: Valve area (VTI): 1.96 cm^2. Valve area (Vmax): 1.96 cm^2. Valve area (Vmean): 1.87 cm^2. - Mitral valve: There was mild regurgitation. - Left atrium: The atrium was moderately dilated. - Right atrium: The atrium was mildly dilated. - Atrial septum: No defect or patent foramen ovale was identified. - Pulmonary arteries: Systolic pressure was mildly increased. - Technically adequate study.  10/01/15: ETT Response to Stress There was no ST segment deviation noted during stress.  There were no significant arrhythmias noted during the test.  ECG was interpretable.  Pt had reassonable exercise tolerance and far better on the flecainide that she had previously. No evidence of exercised induced wide complex rhythms   02/27/14: Stress myoview Overall Impression: Normal stress nuclear study.There is no scar or ischemia. This is a low risk scan. LV Ejection Fraction: 70%. LV Wall Motion: Normal Wall Motion.      Patient Profile     77 y.o. female hx of atrial tachycardia, in review of Dr. Olin Pia note, no hx of AFib, though I do see mention elsewhere of PAFib with hx of GIB on eliquis and prior consideration of Watchman, she has been maintained on Flecainide and BB, orthostatic hypotension, some degree  of SB, HTN, DM, CVA (x2), HLD, ongoing smoker, admitted to [facility] 09/27/17 with abd pain, found with perforated diverticulitis w/abscess, 09/28/17 morning the patient developed WCT 200bpm in review of Dr. Nelly Laurence note she did not lose pulse, did become hypotensive and due to instability cardioverted with a single shock 200J and started on amiodarone, recommended transfer to [facility] for EP evaluation.  She is now POD #1, s/p  EXPLORATORY  LAPAROTOMY (N/A) LEFT & SIGMOID COLECTOMY WITH COLOSTOMY CREATION (HARTMANN PROCEDURE) (Left) Drainage of intra-abdominal abscess Mobilization of splenic flexure  Assessment & Plan    1. WCT, suspect was SVT with abbarency, though unable to r/o VT     Last ischemic w/u noted above w/o evidence of ischemia     Mild Trop elevation likely 2/2  shock     LVEF 65-70% post event without WMA     Patient denied any exertional intolerances of late, no CP  Continue supportive care and management with primary team, from EP standpoint, for now, continue amiodarone. She is at risk for developing atrial fib post op. Once her gut wakes up, she can be transitioned to oral amiodarone.    For questions or updates, please contact [place] Please consult [email or web address] for contact info under Cardiology/STEMI.      Signed, Baldwin [place], PA-C  09/30/2017, 9:41 AM    EP Attending  Patient seen and examined. Agree with above. The patient is a bit confused this morning. "Why am I here?" Her vitals are stable. Tele reveals NSR and sinus tachycardia. I would recommend continuing IV amiodarone at 0.5 mg/min until she is able to take po or more stable from a post-op perspective. We will be available as needed. Please call for more SVT/Wide QRS tachy.  Mikle Bosworth.D.

## 2017-09-30 NOTE — Progress Notes (Signed)
Patient ID: Barbara [name], female   DOB: 1940-09-05, 77 y.o.   MRN: [id number]    1 Day Post-Op  Subjective: Patient feels better today.  More awake than yesterday, but has mittens on from pulling at things overnight.  On 6L O2.  Objective: Vital signs in last 24 hours: Temp:  [97.6 F (36.4 C)-98.9 F (37.2 C)] 98.1 F (36.7 C) (05/10 0753) Pulse Rate:  [72-108] 97 (05/10 0800) Resp:  [13-32] 28 (05/10 0800) BP: (79-160)/(41-85) 148/55 (05/10 0800) SpO2:  [88 %-100 %] 94 % (05/10 0800) Arterial Line BP: (64-184)/(35-88) 161/57 (05/10 0800) Last BM Date: 09/30/17  Intake/Output from previous day: 05/09 0701 - 05/10 0700 In: 4937.9 [I.V.:3772.9; Blood:315; IV Piggyback:850] Out: 845 [Urine:295; Emesis/NG output:50; Blood:500] Intake/Output this shift: Total I/O In: 153 [I.V.:153] Out: -   PE: Gen: appears ill, but better than yesterday Heart: regular Lungs: Coarse BS bilaterally.  O2 on 6L  Sating 95% Abd: soft, tender appropriately, absent BS, midline wound is clean and packed.  Transverse colostomy with nothing in bag, stoma is pink and viable.  NGT with brown/enteric output.200cc in cannister  Lab Results:  Recent Labs    09/29/17 0530  09/29/17 1804 09/30/17 0538  WBC 19.2*  --   --  16.6*  HGB 10.7*   < > 8.8* 11.0*  HCT 34.8*   < > 26.0* 33.5*  PLT 399  --   --  356   < > = values in this interval not displayed.   BMET Recent Labs    09/30/17 0132 09/30/17 0538  NA 141 141  K 3.9 4.1  CL 110 108  CO2 23 24  GLUCOSE 194* 212*  BUN 18 17  CREATININE 1.20* 1.17*  CALCIUM 7.4* 7.4*   PT/INR No results for input(s): LABPROT, INR in the last 72 hours. CMP     Component Value Date/Time   NA 141 09/30/2017 0538   K 4.1 09/30/2017 0538   CL 108 09/30/2017 0538   CO2 24 09/30/2017 0538   GLUCOSE 212 (H) 09/30/2017 0538   BUN 17 09/30/2017 0538   CREATININE 1.17 (H) 09/30/2017 0538   CREATININE 0.97 (H) 07/28/2015 1334   CALCIUM 7.4 (L) 09/30/2017  0538   PROT 5.9 (L) 09/27/2017 0609   ALBUMIN 2.9 (L) 09/27/2017 0609   AST 24 09/27/2017 0609   ALT 16 09/27/2017 0609   ALKPHOS 87 09/27/2017 0609   BILITOT 0.7 09/27/2017 0609   GFRNONAA 45 (L) 09/30/2017 0538   GFRAA 52 (L) 09/30/2017 0538   Lipase     Component Value Date/Time   LIPASE 20 09/26/2017 2207       Studies/Results: No results found.  Anti-infectives: Anti-infectives (From admission, onward)   Start     Dose/Rate Route Frequency Ordered Stop   09/27/17 1200  meropenem (MERREM) 1 g in sodium chloride 0.9 % 100 mL IVPB     1 g 200 mL/hr over 30 Minutes Intravenous Every 12 hours 09/27/17 1032     09/27/17 1000  ciprofloxacin (CIPRO) IVPB 400 mg  Status:  Discontinued     400 mg 200 mL/hr over 60 Minutes Intravenous Every 12 hours 09/27/17 0547 09/27/17 1029   09/27/17 0800  metroNIDAZOLE (FLAGYL) IVPB 500 mg  Status:  Discontinued     500 mg 100 mL/hr over 60 Minutes Intravenous Every 8 hours 09/27/17 0547 09/27/17 1030   09/26/17 2315  ciprofloxacin (CIPRO) IVPB 400 mg     400 mg 200 mL/hr over  60 Minutes Intravenous  Once 09/26/17 2304 09/27/17 0032   09/26/17 2315  metroNIDAZOLE (FLAGYL) IVPB 500 mg     500 mg 100 mL/hr over 60 Minutes Intravenous  Once 09/26/17 2304 09/27/17 0229       Assessment/Plan Septic shock secondary to perforated diverticulitis -POD 1, s/p ex lap with Left/sigmoid colectomy with transverse end colostomy, 09-29-17 by Dr. Redmond Pulling -significant contamination, except post op ileus.  Cont NGT until ostomy begins to work -mobilize and [employer], IS -PT consult for mobilization.  OOB to at least a chair today -robaxin prn pain and spasms.  Not complaining of much pain.  If so, can add gaba and IV tylenol  SVT, s/p cardioversion The patient required cardioversion 5/8 for her SVT.  She is on amiodarone and lopressor currently to control this.  Cardiology following.  AKI -creatinine improved to 1.17 today, but only made about  295cc yesterday.  Should probably keep foley today to monitor accurate I&Os.  Hypotension  Improved    Tobacco abuse On 6L of O2 today.  Defer to CCM  CVA HTN hypoerlipidemia DM Chronic back pain  FEN - NPO/NGT, post op ileus VTE - SCDs/heparin ID - Merrem 5/7 -->   LOS: 3 days    Henreitta Cea , [facility] 09/30/2017, 8:31 AM Pager: [phone]

## 2017-09-30 NOTE — Evaluation (Signed)
Physical Therapy Evaluation Patient Details Name: Barbara [name] MRN: [id number] DOB: July 14, 1940 Today's Date: 09/30/2017   History of Present Illness  77 year old female with prior history of perforated sigmoid diverticulitis.  Was actually discharged on 5/6 on antibiotics and analgesia.  Over the following 24 hours developed worsening abdominal pain, nausea and vomiting.  Repeat abdomen CT demonstrated larger abdominal abscess with cavitation.  She had rising Silas Muff blood cell count as well as hypotension which initially was responsive to fluid.  She also has a history of paroxysmal atrial fibrillation but not on anticoagulation due to prior GI bleeding.  She is maintained on flecainide.  The a.m. 5/9 demonstrated complex tachycardia with a rate in the 200s requiring synchronized cardioversion.  She is now on amiodarone infusion, over the morning she has had intermittent hypotension and because of her severe sepsis critical care has been asked to consult. 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    Clinical Impression  Pt admitted with above diagnosis. Pt currently with functional limitations due to the deficits listed below (see PT Problem List). Pt was able to pivot to chair with +2 mod assist with limitations mostly due to pain.  Will continue PT.  Feel that Rehab is a good option for pt with supportive daughter.  Will follow acutely.  Pt will benefit from skilled PT to increase their independence and safety with mobility to allow discharge to the venue listed below.      Follow Up Recommendations CIR;Supervision/Assistance - 24 hour    Equipment Recommendations  Rolling walker with 5" wheels;3in1 (PT)    Recommendations for Other Services       Precautions / Restrictions Precautions Precautions: Fall Restrictions Weight Bearing Restrictions: No      Mobility  Bed Mobility Overal bed mobility: Needs Assistance Bed Mobility: Supine to Sit     Supine to sit: Max assist;+2  for physical assistance     General bed mobility comments: Helicopter method to come to EOB with use of pad  Transfers Overall transfer level: Needs assistance Equipment used: 2 person hand held assist Transfers: Sit to/from [facility] Sit to Stand: Mod assist;+2 physical assistance Stand pivot transfers: Mod assist;+2 physical assistance       General transfer comment: Pt was able to power up with mod assist and use of pad.  Mod assist to pivot to chair.    Ambulation/Gait             General Gait Details: unable  Stairs            Wheelchair Mobility    Modified Rankin (Stroke Patients Only)       Balance Overall balance assessment: Needs assistance Sitting-balance support: No upper extremity supported;Feet supported Sitting balance-Leahy Scale: Poor Sitting balance - Comments: needed bil UE support or external support to sit EOB.  Postural control: Posterior lean Standing balance support: Bilateral upper extremity supported;During functional activity Standing balance-Leahy Scale: Poor Standing balance comment: relies on bil UE support or external support.  Needed mod assist to stand of 2 persons.                              Pertinent Vitals/Pain Pain Assessment: Faces Faces Pain Scale: Hurts whole lot Pain Location: abdomen Pain Descriptors / Indicators: Aching;Grimacing;Guarding;Operative site guarding Pain Intervention(s): Limited activity within patient's tolerance;Monitored during session;Premedicated before session;Repositioned;Patient requesting pain meds-RN notified  VSS on 6LO2  Home Living Family/patient expects to be  discharged to:: Private residence Living Arrangements: Children Available Help at Discharge: Family;Available 24 hours/day(daughter can stay with pt or pt can stay with daughter) Type of Home: House Home Access: Stairs to enter Entrance Stairs-Rails: Left Entrance Stairs-Number of Steps: 5 Home  Layout: One level Home Equipment: None Additional Comments: daughter states pt can come to her home if needed and then will have 1 step and a walk in shower.    Prior Function Level of Independence: Independent         Comments: drove, did grocery shopping, did not walk long distances due to compressed spine with back pain.      Hand Dominance        Extremity/Trunk Assessment   Upper Extremity Assessment Upper Extremity Assessment: Defer to OT evaluation    Lower Extremity Assessment Lower Extremity Assessment: Generalized weakness    Cervical / Trunk Assessment Cervical / Trunk Assessment: Normal  Communication   Communication: No difficulties  Cognition Arousal/Alertness: Lethargic;Suspect due to medications Behavior During Therapy: Flat affect;Anxious Overall Cognitive Status: Within Functional Limits for tasks assessed                                        General Comments      Exercises     Assessment/Plan    PT Assessment Patient needs continued PT services  PT Problem List Decreased activity tolerance;Decreased balance;Decreased mobility;Decreased knowledge of use of DME;Decreased safety awareness;Pain;Cardiopulmonary status limiting activity;Decreased knowledge of precautions       PT Treatment Interventions DME instruction;Gait training;Functional mobility training;Therapeutic activities;Stair training;Therapeutic exercise;Balance training;Patient/family education    PT Goals (Current goals can be found in the Care Plan section)  Acute Rehab PT Goals Patient Stated Goal: to get better PT Goal Formulation: With patient Time For Goal Achievement: 10/14/17 Potential to Achieve Goals: Good    Frequency Min 3X/week   Barriers to discharge        Co-evaluation               AM-PAC PT "6 Clicks" Daily Activity  Outcome Measure Difficulty turning over in bed (including adjusting bedclothes, sheets and blankets)?:  Unable Difficulty moving from lying on back to sitting on the side of the bed? : Unable Difficulty sitting down on and standing up from a chair with arms (e.g., wheelchair, bedside commode, etc,.)?: A Lot Help needed moving to and from a bed to chair (including a wheelchair)?: A Lot Help needed walking in hospital room?: Total Help needed climbing 3-5 steps with a railing? : Total 6 Click Score: 8    End of Session Equipment Utilized During Treatment: Gait belt;Oxygen(6L) Activity Tolerance: Patient limited by fatigue;Patient limited by pain Patient left: in chair;with call bell/phone within reach;with family/visitor present;with chair alarm set Nurse Communication: Mobility status;Need for lift equipment PT Visit Diagnosis: Unsteadiness on feet (R26.81);Muscle weakness (generalized) (M62.81);Pain Pain - part of body: (abdomen)    Time: 4315-4008 PT Time Calculation (min) (ACUTE ONLY): 24 min   Charges:   PT Evaluation $PT Eval Moderate Complexity: 1 Mod PT Treatments $Therapeutic Activity: 8-22 mins   PT G Codes:        [place] Sylar Voong,PT Acute Rehabilitation [phone] (pager)   Denice Paradise 09/30/2017, 1:20 PM

## 2017-09-30 NOTE — Progress Notes (Signed)
Pt with recurring Afib. VR up to 140/s, BP stable. Marni Griffon updated to all and orders recvd.

## 2017-09-30 NOTE — Consult Note (Addendum)
[place] Nurse ostomy follow up Surgical team following for assessment and plan of care to abd wound. Stoma type/location:  Patient had colostomy surgery performed yesterday, 5/9 Stomal assessment/size: Stoma is red and viable, slightly above skin level Peristomal assessment: Intact skin surrounding Output: No stool or flatus at this time, small amt brown drainage Ostomy pouching: 1pc.  Education provided:  Current pouch was leaking behind the barrier.  Applied barrier ring and one piece pouch to attempt to maintain a seal.  Pt is groggy and in pain on the first post-op day.  Demonstrated pouch change to family members at the bedside.  Supplies ordered to the room for staff nurse use if leakage occurs.  [place] team will perform another pouch change and teaching session when stable and out of ICU.  Enrolled patient in [place] Start Discharge program: No Julien Girt MSN, [place], [place]ome Sam, [place]

## 2017-10-01 ENCOUNTER — Inpatient Hospital Stay (HOSPITAL_COMMUNITY): Payer: Medicare Other

## 2017-10-01 DIAGNOSIS — J9601 Acute respiratory failure with hypoxia: Secondary | ICD-10-CM

## 2017-10-01 LAB — BASIC METABOLIC PANEL
ANION GAP: 6 (ref 5–15)
ANION GAP: 6 (ref 5–15)
Anion gap: 6 (ref 5–15)
BUN: 13 mg/dL (ref 6–20)
BUN: 12 mg/dL (ref 6–20)
BUN: 11 mg/dL (ref 6–20)
CHLORIDE: 109 mmol/L (ref 101–111)
CO2: 29 mmol/L (ref 22–32)
CO2: 29 mmol/L (ref 22–32)
CO2: 31 mmol/L (ref 22–32)
CREATININE: 0.88 mg/dL (ref 0.44–1.00)
Calcium: 7.7 mg/dL — ABNORMAL LOW (ref 8.9–10.3)
Calcium: 7.6 mg/dL — ABNORMAL LOW (ref 8.9–10.3)
Calcium: 7.3 mg/dL — ABNORMAL LOW (ref 8.9–10.3)
Chloride: 109 mmol/L (ref 101–111)
Chloride: 102 mmol/L (ref 101–111)
Creatinine, Ser: 0.91 mg/dL (ref 0.44–1.00)
Creatinine, Ser: 0.8 mg/dL (ref 0.44–1.00)
GFR calc Af Amer: >60 mL/min (ref >60)
GFR calc Af Amer: >60 mL/min (ref >60)
GFR calc non Af Amer: >60 mL/min (ref >60)
GFR calc non Af Amer: >60 mL/min (ref >60)
GLUCOSE: 180 mg/dL — AB (ref 65–99)
GLUCOSE: 243 mg/dL — AB (ref 65–99)
Glucose, Bld: 155 mg/dL — ABNORMAL HIGH (ref 65–99)
POTASSIUM: 4 mmol/L (ref 3.5–5.1)
POTASSIUM: 3.6 mmol/L (ref 3.5–5.1)
Potassium: 3.9 mmol/L (ref 3.5–5.1)
SODIUM: 144 mmol/L (ref 135–145)
SODIUM: 139 mmol/L (ref 135–145)
Sodium: 144 mmol/L (ref 135–145)

## 2017-10-01 LAB — GLUCOSE, CAPILLARY
Glucose-Capillary: 165 mg/dL — ABNORMAL HIGH (ref 65–99)
Glucose-Capillary: 165 mg/dL — ABNORMAL HIGH (ref 65–99)
Glucose-Capillary: 144 mg/dL — ABNORMAL HIGH (ref 65–99)
Glucose-Capillary: 182 mg/dL — ABNORMAL HIGH (ref 65–99)
Glucose-Capillary: 265 mg/dL — ABNORMAL HIGH (ref 65–99)

## 2017-10-01 LAB — PROCALCITONIN: PROCALCITONIN: 2.72 ng/mL

## 2017-10-01 LAB — MAGNESIUM: MAGNESIUM: 2.2 mg/dL (ref 1.7–2.4)

## 2017-10-01 LAB — POCT I-STAT 3, ART BLOOD GAS (G3+)
ACID-BASE EXCESS: 5 mmol/L — AB (ref 0.0–2.0)
BICARBONATE: 31 mmol/L — AB (ref 20.0–28.0)
O2 Saturation: 97 %
PO2 ART: 98 mmHg (ref 83.0–108.0)
Patient temperature: 101
TCO2: 33 mmol/L — ABNORMAL HIGH (ref 22–32)
pCO2 arterial: 55.5 mmHg — ABNORMAL HIGH (ref 32.0–48.0)
pH, Arterial: 7.361 (ref 7.350–7.450)

## 2017-10-01 LAB — HEPATIC FUNCTION PANEL
ALBUMIN: 1.7 g/dL — AB (ref 3.5–5.0)
ALK PHOS: 69 U/L (ref 38–126)
ALT: 14 U/L (ref 14–54)
AST: 19 U/L (ref 15–41)
Bilirubin, Direct: <0.1 mg/dL — ABNORMAL LOW (ref 0.1–0.5)
Total Bilirubin: 0.3 mg/dL (ref 0.3–1.2)
Total Protein: 4.5 g/dL — ABNORMAL LOW (ref 6.5–8.1)

## 2017-10-01 LAB — TROPONIN I: TROPONIN I: 0.35 ng/mL — AB (ref <0.03)

## 2017-10-01 LAB — CBC
HCT: 31.3 % — ABNORMAL LOW (ref 36.0–46.0)
Hemoglobin: 10.2 g/dL — ABNORMAL LOW (ref 12.0–15.0)
MCH: 29.3 pg (ref 26.0–34.0)
MCHC: 32.6 g/dL (ref 30.0–36.0)
MCV: 89.9 fL (ref 78.0–100.0)
PLATELETS: 325 10*3/uL (ref 150–400)
RBC: 3.48 MIL/uL — ABNORMAL LOW (ref 3.87–5.11)
RDW: 14 % (ref 11.5–15.5)
WBC: 18.7 10*3/uL — AB (ref 4.0–10.5)

## 2017-10-01 LAB — LIPASE, BLOOD: Lipase: 19 U/L (ref 11–51)

## 2017-10-01 LAB — PHOSPHORUS: Phosphorus: 1.7 mg/dL — ABNORMAL LOW (ref 2.5–4.6)

## 2017-10-01 LAB — LACTIC ACID, PLASMA: LACTIC ACID, VENOUS: 1.4 mmol/L (ref 0.5–1.9)

## 2017-10-01 MED ORDER — FENTANYL BOLUS VIA INFUSION
50.0000 ug | INTRAVENOUS | Status: DC | PRN
  Administered 2017-10-01 – 2017-10-03 (×19): 50 ug via INTRAVENOUS
  Filled 2017-10-01: qty 50

## 2017-10-01 MED ORDER — FENTANYL CITRATE (PF) 100 MCG/2ML IJ SOLN
50.0000 ug | INTRAMUSCULAR | Status: DC | PRN
  Administered 2017-10-03 – 2017-10-04 (×2): 50 ug via INTRAVENOUS
  Filled 2017-10-01 (×3): qty 2

## 2017-10-01 MED ORDER — ETOMIDATE 2 MG/ML IV SOLN
20.0000 mg | Freq: Once | INTRAVENOUS | Status: AC
  Administered 2017-10-01: 20 mg via INTRAVENOUS

## 2017-10-01 MED ORDER — SODIUM BICARBONATE 8.4 % IV SOLN
INTRAVENOUS | Status: AC
  Filled 2017-10-01: qty 100

## 2017-10-01 MED ORDER — DEXTROSE 5 % IV SOLN
15.0000 mmol | Freq: Once | INTRAVENOUS | Status: AC
  Administered 2017-10-01: 15 mmol via INTRAVENOUS
  Filled 2017-10-01: qty 5

## 2017-10-01 MED ORDER — FENTANYL CITRATE (PF) 100 MCG/2ML IJ SOLN
50.0000 ug | INTRAMUSCULAR | Status: DC | PRN
  Administered 2017-10-03 – 2017-10-04 (×8): 50 ug via INTRAVENOUS
  Filled 2017-10-01 (×9): qty 2

## 2017-10-01 MED ORDER — FENTANYL 2500MCG IN NS 250ML (10MCG/ML) PREMIX INFUSION
25.0000 ug/h | INTRAVENOUS | Status: DC
Start: 2017-10-01 — End: 2017-10-01

## 2017-10-01 MED ORDER — INSULIN ASPART 100 UNIT/ML ~~LOC~~ SOLN
0.0000 [IU] | SUBCUTANEOUS | Status: DC
  Administered 2017-10-01: 8 [IU] via SUBCUTANEOUS
  Administered 2017-10-01: 3 [IU] via SUBCUTANEOUS
  Administered 2017-10-01: 2 [IU] via SUBCUTANEOUS
  Administered 2017-10-02: 11 [IU] via SUBCUTANEOUS
  Administered 2017-10-02 (×3): 3 [IU] via SUBCUTANEOUS
  Administered 2017-10-02: 5 [IU] via SUBCUTANEOUS
  Administered 2017-10-02: 8 [IU] via SUBCUTANEOUS
  Administered 2017-10-03: 3 [IU] via SUBCUTANEOUS
  Administered 2017-10-03: 2 [IU] via SUBCUTANEOUS
  Administered 2017-10-03: 3 [IU] via SUBCUTANEOUS
  Administered 2017-10-03: 5 [IU] via SUBCUTANEOUS
  Administered 2017-10-03: 2 [IU] via SUBCUTANEOUS
  Administered 2017-10-04 (×2): 3 [IU] via SUBCUTANEOUS
  Administered 2017-10-04 (×2): 5 [IU] via SUBCUTANEOUS
  Administered 2017-10-04: 3 [IU] via SUBCUTANEOUS
  Administered 2017-10-04: 8 [IU] via SUBCUTANEOUS
  Administered 2017-10-05: 3 [IU] via SUBCUTANEOUS
  Administered 2017-10-05 – 2017-10-06 (×2): 2 [IU] via SUBCUTANEOUS
  Administered 2017-10-07 (×2): 3 [IU] via SUBCUTANEOUS

## 2017-10-01 MED ORDER — MIDAZOLAM HCL 2 MG/2ML IJ SOLN
INTRAMUSCULAR | Status: AC
  Administered 2017-10-01: 2 mg
  Filled 2017-10-01: qty 2

## 2017-10-01 MED ORDER — FENTANYL CITRATE (PF) 100 MCG/2ML IJ SOLN
INTRAMUSCULAR | Status: AC
  Administered 2017-10-01: 100 ug
  Filled 2017-10-01: qty 2

## 2017-10-01 MED ORDER — SODIUM BICARBONATE 8.4 % IV SOLN
100.0000 meq | Freq: Once | INTRAVENOUS | Status: AC
  Administered 2017-10-01: 100 meq via INTRAVENOUS
  Filled 2017-10-01: qty 100

## 2017-10-01 MED ORDER — ORAL CARE MOUTH RINSE
15.0000 mL | Freq: Two times a day (BID) | OROMUCOSAL | Status: DC
  Administered 2017-10-01 – 2017-10-03 (×6): 15 mL via OROMUCOSAL

## 2017-10-01 MED ORDER — ROCURONIUM BROMIDE 50 MG/5ML IV SOLN
50.0000 mg | Freq: Once | INTRAVENOUS | Status: AC
  Administered 2017-10-01: 50 mg via INTRAVENOUS

## 2017-10-01 MED ORDER — FENTANYL CITRATE (PF) 100 MCG/2ML IJ SOLN
50.0000 ug | Freq: Once | INTRAMUSCULAR | Status: AC
  Administered 2017-10-01: 50 ug via INTRAVENOUS

## 2017-10-01 MED ORDER — LACTATED RINGERS IV BOLUS
1500.0000 mL | Freq: Once | INTRAVENOUS | Status: AC
  Administered 2017-10-01: 1500 mL via INTRAVENOUS

## 2017-10-01 MED ORDER — FENTANYL CITRATE (PF) 100 MCG/2ML IJ SOLN: 12.5000 ug | INTRAMUSCULAR | Status: DC | PRN

## 2017-10-01 MED ORDER — CHLORHEXIDINE GLUCONATE 0.12 % MT SOLN
15.0000 mL | Freq: Two times a day (BID) | OROMUCOSAL | Status: DC
  Administered 2017-10-01 – 2017-10-03 (×4): 15 mL via OROMUCOSAL
  Filled 2017-10-01: qty 15

## 2017-10-01 MED ORDER — SODIUM CHLORIDE 0.9 % IV SOLN
INTRAVENOUS | Status: DC
  Filled 2017-10-01: qty 1

## 2017-10-01 MED ORDER — TRAVASOL 10 % IV SOLN
INTRAVENOUS | Status: AC
  Administered 2017-10-01: 17:00:00 via INTRAVENOUS
  Filled 2017-10-01: qty 499.8

## 2017-10-01 MED ORDER — MIDAZOLAM HCL 2 MG/2ML IJ SOLN
1.0000 mg | INTRAMUSCULAR | Status: DC | PRN
  Administered 2017-10-03: 1 mg via INTRAVENOUS
  Filled 2017-10-01: qty 2

## 2017-10-01 MED ORDER — FENTANYL 2500MCG IN NS 250ML (10MCG/ML) PREMIX INFUSION
0.0000 ug/h | INTRAVENOUS | Status: DC
  Administered 2017-10-01: 25 ug/h via INTRAVENOUS
  Filled 2017-10-01: qty 250

## 2017-10-01 NOTE — Progress Notes (Signed)
PHARMACY - ADULT TOTAL PARENTERAL NUTRITION CONSULT NOTE   Pharmacy Consult for TPN Indication: Prolonged Ileus  Patient Measurements: Height: 5\' 3"  (160 cm) Weight: 150 lb 12.7 oz (68.4 kg) IBW/kg (Calculated) : 52.4 TPN AdjBW (KG): 64.8 Body mass index is 26.71 kg/m.  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 Significant contamination was noted with high suspicion for post-op ileus. Pharmacy was consulted for TPN.   Patient unable to provide history currently. Daughter reports patient has taken in little over the past 2 weeks due to pain and nausea. She states her only meals were small like a piece of bread to help keep her blood sugar up.   GI: Concern for post-op ileus. No further surgeries planned at this time. NGT output 400 mL. LBM 5/10.  Endo: DM on Lantus 20 qd + SSI at home. CBGs currently 165-238 on SSI + Lantus 5 qd. Currently on D5-1/2 NS at 75 mL/hr providing ~90g of dextrose per 24 hr period. Insulin requirements in the past 24 hours: 24 units/24hrs. Lytes: Phos low 1.7, all others wnl. CoCa 8.58. Renal: SCr 0.88,  Pulm: 6L [place] Cards: BP ok, Afib - rate in 80s on Amiodarone at 30 mg/hr. Metoprolol 2.5mg  IV q6h. Hepatobil: LFTs wnl Neuro: RASS -1. ID: Merrem for perforated intra-abdominal infection. WBC 18.7, Tm 100.4.  TPN Access: PICC 09/28/16 TPN start date: 10/01/17 Nutritional Goals (per RD recommendation on pending): kCal: Protein:  Fluid:  Goal TPN rate is 70 ml/hr (provides 100% of needs)  Current Nutrition:  NPO TPN  Plan:  Initiate TPN at 35 mL/hr. Hold 20% lipid emulsion for first 7 days for ICU patients per ASPEN guidelines (Start date 5/17) This TPN provides 49.98 g of protein, 201 g of dextrose, and 0 g of lipids which provides 885 kCals per day, meeting 50% of patient needs Electrolytes in  TPN: Increase Phos (16.8 mmol/d), others standard for now Add MVI, trace elements, After discussion with Dr. Thereasa Solo, will discontinue Lantus and add 20 units of regular insulin to TPN Change to moderate SSI  q4h and adjust as needed Change IVMF to NS and decrease to 40 mL/hr when TPN is initiated at 1800.  Monitor TPN labs, recheck in AM  KPhos 15 mmol x1 today.   Sloan Leiter, PharmD, BCPS, BCCCP Clinical Pharmacist Clinical phone 10/01/2017 until 3:[phone] After hours, please call [phone] 10/01/2017,8:41 AM

## 2017-10-01 NOTE — Progress Notes (Addendum)
PULMONARY / CRITICAL CARE MEDICINE   Name: Barbara [name] MRN: [id number] DOB: [birth year]    ADMISSION DATE:  09/26/2017 CONSULTATION DATE:  rapid wide-complex tachycardia  REFERRING MD:  Redmond Pulling  CHIEF COMPLAINT:  Sepsis and septic shock  BRIEF 77 year old female with prior history of perforated sigmoid diverticulitis.  Was actually discharged on 5/6 on antibiotics and analgesia.  Over the following 24 hours developed worsening abdominal pain, nausea and vomiting.  Repeat abdomen CT demonstrated larger abdominal abscess with cavitation.  She had rising white blood cell count as well as hypotension which initially was responsive to fluid.  She also has a history of paroxysmal atrial fibrillation but not on anticoagulation due to prior GI bleeding.  She is maintained on flecainide.  The a.m. 5/9 demonstrated complex tachycardia with a rate in the 200s requiring synchronized cardioversion.  She is now on amiodarone infusion, over the morning she has had intermittent hypotension and because of her severe sepsis critical care has been asked to consult    EVENTS Right upper extremity PICC line 09/26/17 - at [facility] abdomen 5/7: Acute perforated diverticulitis. Pericolonic abscess extending between the bladder and uterus has increased in size and surrounding inflammation. Contrast and debris extends into the abscess cavity.   Urine culture 5/6 was negative   5/7 meropenem>>> Had been on antibiotics since 5/3 prior.     5/10./19 - Postop, hemodynamically stable.  Renal function improved.  Has developed some mild delirium since surgery also mild increase in oxygen need otherwise doing well   SUBJECTIVE/OVERNIGHT/INTERVAL HX 10/01/17 - rising wbc. Low Phos and getting repleted. CCS starting TPN. Overnight and throught his AM per rN - worsening delirium (yesterday only memory issues), lethargic, more labored respiratoion continues to be on 6L [place] like yesterday but pulse ox 89-91% per RN.  Febrile to 100.58F . Dauighter wondring if intubation would help patient and feels patient is post op pain  cXR worse  On amio gtt - seen by cards this AM   VITAL SIGNS: BP (!) 136/50   Pulse 85   Temp (!) 100.4 F (38 C) (Axillary)   Resp 19   Ht 5\' 3"  (1.6 m)   Wt 68.4 kg (150 lb 12.7 oz)   SpO2 92%   BMI 26.71 kg/m   Liters nasal cannula HEMODYNAMICS:    VENTILATOR SETTINGS:    INTAKE / OUTPUT: I/O last 3 completed shifts: In: 4640.6 [I.V.:4340.6; IV Piggyback:300] Out: 9892 [Urine:825; Emesis/NG output:450]  PHYSICAL EXAMINATION:  General Appearance:    Looks criticall ill . Overweight +  Head:    Normocephalic, without obvious abnormality, atraumatic  Eyes:    PERRL - yes, conjunctiva/corneas - clear      Ears:    Normal external ear canals, both ears  Nose:   NG tube - yes  Throat:  ETT TUBE - no , OG tube - no  Neck:   Supple,  No enlargement/tenderness/nodules     Lungs:     Tachypneic, 6L [place], desats, CXR worse   Chest wall:    No deformity  Heart:    S1 and S2 normal, no murmur, CVP - no.  Pressors - no  Abdomen:   Mildly distended, Diffusely tender  Genitalia:    Not done  Rectal:   not done  Extremities:   Extremities- edema and intact     Skin:   Intact in exposed areas .     Neurologic:   Sedation - prn dilaudid -> RASS - -  2 . Moves all 4s - yes. CAM-ICU - +ve . Orientation - not fully. Moans in pain    PULMONARY Recent Labs  Lab 09/29/17 1448 09/29/17 1644 09/29/17 1729 09/29/17 1804  PHART 7.284* 7.257* 7.334* 7.363  PCO2ART 28.6* 38.6 38.8 39.8  PO2ART 73.0* 243.0* 112.0* 112.0*  HCO3 13.5* 17.2* 20.7 22.7  TCO2 14* 18* 22 24  O2SAT 93.0 100.0 98.0 98.0    CBC Recent Labs  Lab 09/29/17 0530  09/29/17 1804 09/30/17 0538 10/01/17 0402  HGB 10.7*   < > 8.8* 11.0* 10.2*  HCT 34.8*   < > 26.0* 33.5* 31.3*  WBC 19.2*  --   --  16.6* 18.7*  PLT 399  --   --  356 325   < > = values in this interval not displayed.     COAGULATION Recent Labs  Lab 09/27/17 0658  INR 1.03    CARDIAC   Recent Labs  Lab 09/26/17 2207 09/28/17 0848 09/28/17 1419  TROPONINI <0.03 0.03* 0.36*   No results for input(s): PROBNP in the last 168 hours.   CHEMISTRY Recent Labs  Lab 09/28/17 0848  09/29/17 9379  09/29/17 2119 09/30/17 0132 09/30/17 0538 09/30/17 0938 09/30/17 1157 10/01/17 0402  NA  --    < >  --    < > 142 141 141 142  --  144  K  --    < >  --    < > 3.5 3.9 4.1 4.0  --  4.0  CL  --    < >  --   --  110 110 108 110  --  109  CO2  --    < >  --   --  25 23 24 25   --  29  GLUCOSE  --    < >  --   --  150* 194* 212* 176*  --  180*  BUN  --    < >  --   --  20 18 17 16   --  13  CREATININE  --    < >  --   --  1.19* 1.20* 1.17* 1.04*  --  0.88  CALCIUM  --    < >  --   --  7.4* 7.4* 7.4* 7.5*  --  7.7*  MG 1.8  --  2.6*  --   --   --   --   --  2.1 2.2  PHOS  --   --   --   --   --   --   --   --  1.4* 1.7*   < > = values in this interval not displayed.   Estimated Creatinine Clearance: 52.1 mL/min (by C-G formula based on SCr of 0.88 mg/dL).   LIVER Recent Labs  Lab 09/26/17 2207 09/27/17 0609 09/27/17 0658  AST 27 24  --   ALT 19 16  --   ALKPHOS 100 87  --   BILITOT 0.7 0.7  --   PROT 6.9 5.9*  --   ALBUMIN 3.5 2.9*  --   INR  --   --  1.03     INFECTIOUS Recent Labs  Lab 09/26/17 2207 09/27/17 0029 09/29/17 1300  LATICACIDVEN 1.5 1.0 1.5     ENDOCRINE CBG (last 3)  Recent Labs    09/30/17 1959 10/01/17 0338 10/01/17 0845  GLUCAP 238* 165* 165*         IMAGING x48h  - image(s) personally  visualized  -   highlighted in bold Dg Chest Port 1 View  Result Date: 09/30/2017 CLINICAL DATA:  Shortness of breath EXAM: PORTABLE CHEST 1 VIEW COMPARISON:  02/20/2014 FINDINGS: An orogastric tube reaches the stomach. Right upper extremity PICC with tip at the upper SVC. Patchy bilateral airspace opacity. Streaky opacities at the bases with probable small  effusions. Normal heart size for technique. No pneumothorax. IMPRESSION: 1. Patchy bilateral lung opacity primarily concerning for pneumonia. 2. There is low volumes with streaky opacity, likely superimposed atelectasis. Electronically Signed   By: Monte Fantasia M.D.   On: 09/30/2017 11:32      DISCUSSION: Perforated diverticulitis with abscess, resultant septic shock and mods as well as DKA.  She is now status post exploratory laparotomy with sigmoid colectomy and transverse colectomy.  Hemodynamically stable anion gap is closed from a DKA standpoint renal functions normalized.  She does have some mild delirium and hypoxia today however ultimately seems to be doing well  ASSESSMENT / PLAN:  Severe sepsis, septic shock in the setting of perforated diverticulitis with new abscess, now postoperative day #1 for exploratory lap with sigmoid colectomy and transverse and colostomy on 5/9. -There is marked abdominal contamination per surgical note  10/01/2017 - still off pressors  Plan N.p.o./bowel rest Wound care and ostomy care per surgical services CCS starting TPN   #RESP  A 10/01/17 - worsening resp distress and worseing CXR. ? ALI  Plan Intubate PRVC   #CVS WCT w/ h/o PAF w/ h/o CAD->required cardioversion X 1 -EF 65-70%; No new WM abnormality  \ 10/01/17 - holding BP/HR, Onb Amio gtt to continue per cards 09/30/17  Plan Seen by cardiology, with plan to transition to p.o. amiodarone once we can use her gut Continue telemetry monitoring   #ENDOCRINE Diabetic ketoacidosis on transer to [facility].  Anion gap is closed and bic improved > 20 on 09/30/17  Plan ICU hyperglycemia protocol with tPN   #RENAL Recent Labs  Lab 09/29/17 2119 09/30/17 0132 09/30/17 0538 09/30/17 0938 10/01/17 0402  CREATININE 1.19* 1.20* 1.17* 1.04* 0.88    AKI in the setting of sepsis.  Resolved 10/01/17 but at risk again follwign worsning' Low phos - being repleted  PLAN monitor  #CNS A:  delirium metabolic and post op pain  P PAD protocol post intubation    Anemia of critical illness -No evidence of postoperative bleeding Plan Trend CBC Subcu heparin      DVT prophylaxis: sub-Cutaneous heparin SUP: PPI   Diet: NPO . TPN Activity: BR Disposition : ICU   FAMILY  - daughter very concerned about mom's health. Updated extensiely 09/21/17. INtubatiing     The patient is critically ill with multiple organ systems failure and requires high complexity decision making for assessment and support, frequent evaluation and titration of therapies, application of advanced monitoring technologies and extensive interpretation of multiple databases.   Critical Care Time devoted to patient care services described in this note is  30  Minutes. This time reflects time of care of this signee Dr Brand Males. This critical care time does not reflect procedure time, or teaching time or supervisory time of PA/NP/Med student/Med Resident etc but could involve care discussion time    Dr. Brand Males, M.D., [facility].C.P Pulmonary and Critical Care Medicine Staff Physician [place] [facility] Pager: [phone], If no answer or between  15:00h - 7:00h: call [phone]  10/01/2017 10:55 AM

## 2017-10-01 NOTE — Procedures (Signed)
Intubation Procedure Note Barbara [name] [id number] 1940-09-12  Procedure: Intubation Indications: Respiratory insufficiency  Procedure Details Consent: Risks of procedure as well as the alternatives and risks of each were explained to the (patient/caregiver).  Consent for procedure obtained. verbally from daughter given emergency nature of intubation  Time Out: Verified patient identification, verified procedure, site/side was marked, verified correct patient position, special equipment/implants available, medications/allergies/relevent history reviewed, required imaging and test results available.  Performed  Maximum sterile technique was used including cap, gloves, gown, hand hygiene and mask.  MAC  glidescope use Bic amp given 10mq and fent and versed and etomidate and roc preiintubation and post intubation 2nd amp 512m   Evaluation Hemodynamic Status: BP stable throughout; O2 sats: stable throughout Patient's Current Condition: stable Complications: No apparent complications Patient did tolerate procedure well. Chest X-ray ordered to verify placement.  CXR: pending.  Dr. MuBrand MalesM.D., F.[facility].P Pulmonary and Critical Care Medicine Staff Physician, [facility]irector - Interstitial Lung Disease  Program  Pulmonary [id number]: [phone] no answer or between  15:00h - 7:00h: call [phone] Telephone: [phone]

## 2017-10-01 NOTE — Progress Notes (Signed)
CRITICAL VALUE ALERT  Critical Value:  Troponin 0.35  Date & Time Notied:  12:56 PM 10/01/17   Provider Notified: Dr. Chase Caller  Orders Received/Actions taken: Notified, will monitor.

## 2017-10-01 NOTE — Progress Notes (Signed)
Called into patient room by daughter.  Patient more lethargic, unable to communicate her name.  Patient looks more in distress then earlier with increased WOB and SOB. Patient remains febrile, 100.1 axillary, despite ice packs.  Patient O2 sat remains in the low 90's, frequently dropping to the high 80's.  Dr. Chase Caller assessed patient and decided the best course of action at this time is to intubate the patient and test labels and CxR.  Also 1.5L LR bolus to be given.

## 2017-10-01 NOTE — Progress Notes (Signed)
PM re-round   S reintubated earlier. Needed fent gtt   O RASS -3 Sync with vent   PULMONARY Recent Labs  Lab 09/29/17 1448 09/29/17 1644 09/29/17 1729 09/29/17 1804 10/01/17 1403  PHART 7.284* 7.257* 7.334* 7.363 7.361  PCO2ART 28.6* 38.6 38.8 39.8 55.5*  PO2ART 73.0* 243.0* 112.0* 112.0* 98.0  HCO3 13.5* 17.2* 20.7 22.7 31.0*  TCO2 14* 18* 22 24 33*  O2SAT 93.0 100.0 98.0 98.0 97.0    CBC Recent Labs  Lab 09/29/17 0530  09/29/17 1804 09/30/17 0538 10/01/17 0402  HGB 10.7*   < > 8.8* 11.0* 10.2*  HCT 34.8*   < > 26.0* 33.5* 31.3*  WBC 19.2*  --   --  16.6* 18.7*  PLT 399  --   --  356 325   < > = values in this interval not displayed.    COAGULATION Recent Labs  Lab 09/27/17 0658  INR 1.03    CARDIAC   Recent Labs  Lab 09/26/17 2207 09/28/17 0848 09/28/17 1419 10/01/17 1135  TROPONINI <0.03 0.03* 0.36* 0.35*   No results for input(s): PROBNP in the last 168 hours.   CHEMISTRY Recent Labs  Lab 09/28/17 0848  09/29/17 4081  09/30/17 0132 09/30/17 0538 09/30/17 4481 09/30/17 1157 10/01/17 0402 10/01/17 1135  NA  --    < >  --    < > 141 141 142  --  144 144  K  --    < >  --    < > 3.9 4.1 4.0  --  4.0 3.9  CL  --    < >  --    < > 110 108 110  --  109 109  CO2  --    < >  --    < > 23 24 25   --  29 29  GLUCOSE  --    < >  --    < > 194* 212* 176*  --  180* 155*  BUN  --    < >  --    < > 18 17 16   --  13 12  CREATININE  --    < >  --    < > 1.20* 1.17* 1.04*  --  0.88 0.91  CALCIUM  --    < >  --    < > 7.4* 7.4* 7.5*  --  7.7* 7.6*  MG 1.8  --  2.6*  --   --   --   --  2.1 2.2  --   PHOS  --   --   --   --   --   --   --  1.4* 1.7*  --    < > = values in this interval not displayed.   Estimated Creatinine Clearance: 50.3 mL/min (by C-G formula based on SCr of 0.91 mg/dL).   LIVER Recent Labs  Lab 09/26/17 2207 09/27/17 0609 09/27/17 0658 10/01/17 1135  AST 27 24  --  19  ALT 19 16  --  14  ALKPHOS 100 87  --  69  BILITOT  0.7 0.7  --  0.3  PROT 6.9 5.9*  --  4.5*  ALBUMIN 3.5 2.9*  --  1.7*  INR  --   --  1.03  --      INFECTIOUS Recent Labs  Lab 09/27/17 0029 09/29/17 1300 10/01/17 1135  LATICACIDVEN 1.0 1.5 1.4  PROCALCITON  --   --  2.72  ENDOCRINE CBG (last 3)  Recent Labs    10/01/17 0338 10/01/17 0845 10/01/17 1213  GLUCAP 165* 165* 144*         IMAGING x48h  - image(s) personally visualized  -   highlighted in bold Dg Chest Port 1 View  Result Date: 10/01/2017 CLINICAL DATA:  Verify endotracheal tube. EXAM: PORTABLE CHEST 1 VIEW COMPARISON:  10/01/2017 FINDINGS: Endotracheal tube is in place with tip approximately 2.9 centimeters above the carina. Nasogastric tube is in place, tip overlying the level of the stomach and side-port in the region the gastroesophageal junction. A RIGHT-sided PICC line tip overlies the superior vena cava. Heart size is normal. There are airspace filling opacities bilaterally with central air bronchograms. There are small bilateral pleural effusions. IMPRESSION: 1. Interval placement of endotracheal tube, tip to be in good position. 2. Persistent bilateral airspace filling opacities and pleural effusions, similar to prior study. Electronically Signed   By: Nolon Nations M.D.   On: 10/01/2017 13:35   Dg Chest Port 1 View  Result Date: 10/01/2017 CLINICAL DATA:  Respiratory distress.  Follow-up. EXAM: PORTABLE CHEST 1 VIEW COMPARISON:  09/30/2017 FINDINGS: Nasogastric tube enters the abdomen. Right arm PICC tip is in the SVC above the right atrium. Widespread pulmonary opacity has worsened, consistent with progressive edema. Small effusions and dependent atelectasis. IMPRESSION: Progressive/worsened diffuse edema pattern. Electronically Signed   By: Nelson Chimes M.D.   On: 10/01/2017 11:33   Dg Chest Port 1 View  Result Date: 09/30/2017 CLINICAL DATA:  Shortness of breath EXAM: PORTABLE CHEST 1 VIEW COMPARISON:  02/20/2014 FINDINGS: An orogastric tube  reaches the stomach. Right upper extremity PICC with tip at the upper SVC. Patchy bilateral airspace opacity. Streaky opacities at the bases with probable small effusions. Normal heart size for technique. No pneumothorax. IMPRESSION: 1. Patchy bilateral lung opacity primarily concerning for pneumonia. 2. There is low volumes with streaky opacity, likely superimposed atelectasis. Electronically Signed   By: Monte Fantasia M.D.   On: 09/30/2017 11:32    A Likely ARDS/ALI  P Continue vent support Consider lasix 10/02/17   Dr. Brand Males, M.D., F.C.C.P Pulmonary and Critical Care Medicine Staff Physician, [facility] Director - Interstitial Lung Disease  Program  Pulmonary [place] [facility] [place], [place], [address]  Pager: [phone], If no answer or between  15:00h - 7:00h: call [phone] Telephone: [phone]

## 2017-10-01 NOTE — Progress Notes (Signed)
Initial Nutrition Assessment  DOCUMENTATION CODES:   Not applicable  INTERVENTION:  TPN per pharmacy  Replete Phos (1.7)  NUTRITION DIAGNOSIS:   Inadequate oral intake related to inability to eat as evidenced by NPO status   GOAL:   Provide needs based on ASPEN/SCCM guidelines  MONITOR:   Vent status, I & O's, Labs, Skin  ASSESSMENT:   77 yo female with PMH of DM, HTN, she was recently admitted to the hospital with sigmoid radiculitis and abscess, discharged on abx 5/5. Returned 5/6 with worsening abdominal pain along with nausea, vomiting, and diarrhea. Found with perforated diverticulitis with abscess, resultant septic shock and DKA. She is 1 day s/p exp lap with sigmoid colectomy and transverse colectomy. Anion gap is closed from DKA.   Spoke with daughter at bedside. She reports patient with UBW of 145 pounds, and 10 pound weight loss over the past 6 months. Per daughter, patient was happy with this as she has always been concerned with weight. Watches PO intake meticulously. For breakfast she would eat 1/2 a piece of toast and an egg, a sandwich for lunch and a frozen meal for dinner. Per chart appears she has lost 2-3 pounds over the past 3 months. No apparent risk for refeeding or concerns with titrating TPN. Completed most of NFPE. Appears WNL  Patient is currently intubated on ventilator support MV: 8.9 L/min Temp (24hrs), Avg:99.2 F (37.3 C), Min:98.2 F (36.8 C), Max:100.4 F (38 C) Propofol: None  Labs reviewed:  CBGs 182, 144 Phos 1.7  Medications reviewed and include:  Insulin Amiodarone gtt D5 1/2 NS at 89mL/hr --> 306 calories Fentanyl gtt  NUTRITION - FOCUSED PHYSICAL EXAM:    Most Recent Value  Orbital Region  No depletion  Upper Arm Region  No depletion  Thoracic and Lumbar Region  Unable to assess  Buccal Region  Unable to assess  Temple Region  Mild depletion  Clavicle Bone Region  No depletion  Clavicle and Acromion Bone Region  No  depletion  Scapular Bone Region  Unable to assess  Dorsal Hand  Unable to assess  Patellar Region  No depletion  Anterior Thigh Region  No depletion  Posterior Calf Region  No depletion       Diet Order:   Diet Order    None      EDUCATION NEEDS:   Not appropriate for education at this time  Skin:  Skin Assessment: Reviewed RN Assessment(closed incision to abdomen)  Last BM:  09/30/2017  Height:   Ht Readings from Last 1 Encounters:  09/27/17 5\' 3"  (1.6 m)    Weight:   Wt Readings from Last 1 Encounters:  09/29/17 150 lb 12.7 oz (68.4 kg)    Ideal Body Weight:  52.27 kg  BMI:  Body mass index is 26.71 kg/m.  Estimated Nutritional Needs:   Kcal:  1522 calories (PSU 2003b w/ MSJ 1158)  Protein:  102-115 grams (1.5-1.7g/kg)  Fluid:  per MD  Satira Anis. Loneta Tamplin, MS, RD LDN Inpatient Clinical Dietitian Pager [phone]

## 2017-10-01 NOTE — Progress Notes (Signed)
2 Days Post-Op   Subjective/Chief Complaint: Patient arousable, mildly disoriented No distress No pressors  Objective: Vital signs in last 24 hours: Temp:  [98.2 F (36.8 C)-98.6 F (37 C)] 98.6 F (37 C) (05/11 0339) Pulse Rate:  [75-119] 82 (05/11 0800) Resp:  [13-33] 20 (05/11 0800) BP: (109-166)/(45-105) 129/54 (05/11 0800) SpO2:  [90 %-98 %] 92 % (05/11 0800) Arterial Line BP: (117-162)/(49-76) 138/55 (05/10 1100) Last BM Date: 09/30/17  Intake/Output from previous day: 05/10 0701 - 05/11 0700 In: 2671.8 [I.V.:2471.8; IV Piggyback:200] Out: 1000 [Urine:600; Emesis/NG output:400] Intake/Output this shift: No intake/output data recorded.  General appearance: cooperative, no distress and slowed mentation GI: soft, incisional tenderness Wound - clean; no granulation tissue yet; ostomy - pink, no output  Lab Results:  Recent Labs    09/30/17 0538 10/01/17 0402  WBC 16.6* 18.7*  HGB 11.0* 10.2*  HCT 33.5* 31.3*  PLT 356 325   BMET Recent Labs    09/30/17 0938 10/01/17 0402  NA 142 144  K 4.0 4.0  CL 110 109  CO2 25 29  GLUCOSE 176* 180*  BUN 16 13  CREATININE 1.04* 0.88  CALCIUM 7.5* 7.7*   PT/INR No results for input(s): LABPROT, INR in the last 72 hours. ABG Recent Labs    09/29/17 1729 09/29/17 1804  PHART 7.334* 7.363  HCO3 20.7 22.7    Studies/Results: Dg Chest Port 1 View  Result Date: 09/30/2017 CLINICAL DATA:  Shortness of breath EXAM: PORTABLE CHEST 1 VIEW COMPARISON:  02/20/2014 FINDINGS: An orogastric tube reaches the stomach. Right upper extremity PICC with tip at the upper SVC. Patchy bilateral airspace opacity. Streaky opacities at the bases with probable small effusions. Normal heart size for technique. No pneumothorax. IMPRESSION: 1. Patchy bilateral lung opacity primarily concerning for pneumonia. 2. There is low volumes with streaky opacity, likely superimposed atelectasis. Electronically Signed   By: Monte Fantasia M.D.   On:  09/30/2017 11:32    Anti-infectives: Anti-infectives (From admission, onward)   Start     Dose/Rate Route Frequency Ordered Stop   09/27/17 1200  meropenem (MERREM) 1 g in sodium chloride 0.9 % 100 mL IVPB     1 g 200 mL/hr over 30 Minutes Intravenous Every 12 hours 09/27/17 1032     09/27/17 1000  ciprofloxacin (CIPRO) IVPB 400 mg  Status:  Discontinued     400 mg 200 mL/hr over 60 Minutes Intravenous Every 12 hours 09/27/17 0547 09/27/17 1029   09/27/17 0800  metroNIDAZOLE (FLAGYL) IVPB 500 mg  Status:  Discontinued     500 mg 100 mL/hr over 60 Minutes Intravenous Every 8 hours 09/27/17 0547 09/27/17 1030   09/26/17 2315  ciprofloxacin (CIPRO) IVPB 400 mg     400 mg 200 mL/hr over 60 Minutes Intravenous  Once 09/26/17 2304 09/27/17 0032   09/26/17 2315  metroNIDAZOLE (FLAGYL) IVPB 500 mg     500 mg 100 mL/hr over 60 Minutes Intravenous  Once 09/26/17 2304 09/27/17 0229      Assessment/Plan: Septic shock secondary to perforated diverticulitis -s/p ex lap with Left/sigmoid colectomy with transverse end colostomy, 09-29-17 by Dr. Redmond Pulling -significant contamination, expect post op ileus.  Cont NGT until ostomy begins to work.  Initiate TPN -mobilize and pulm toilet, IS -PT consult for mobilization.  OOB to at least a chair today -robaxin prn pain and spasms.  Not complaining of much pain.  If so, can add gaba and IV tylenol  SVT, s/p cardioversion The patient required cardioversion 5/8  for her SVT. She is on amiodarone and lopressor currently to control this. Cardiology following.  AKI -creatinine improved to 0.88 today, made about 600 cc yesterday.  Should probably keep foley today to monitor accurate I&Os.  Hypotension  Improved    Tobacco abuse On 6L of O2 today.  Defer to CCM  CVA HTN hypoerlipidemia DM Chronic back pain  FEN - NPO/NGT, post op ileus; start TPN VTE - SCDs/heparin ID - Merrem 5/7 -->     LOS: 4 days    Maia Petties 10/01/2017

## 2017-10-02 ENCOUNTER — Inpatient Hospital Stay (HOSPITAL_COMMUNITY): Payer: Medicare Other

## 2017-10-02 LAB — COMPREHENSIVE METABOLIC PANEL
ALT: 12 U/L — AB (ref 14–54)
AST: 15 U/L (ref 15–41)
Albumin: 1.5 g/dL — ABNORMAL LOW (ref 3.5–5.0)
Alkaline Phosphatase: 65 U/L (ref 38–126)
Anion gap: 7 (ref 5–15)
BUN: 11 mg/dL (ref 6–20)
CHLORIDE: 107 mmol/L (ref 101–111)
CO2: 31 mmol/L (ref 22–32)
CREATININE: 0.76 mg/dL (ref 0.44–1.00)
Calcium: 7.6 mg/dL — ABNORMAL LOW (ref 8.9–10.3)
GFR calc Af Amer: >60 mL/min (ref >60)
GFR calc non Af Amer: >60 mL/min (ref >60)
Glucose, Bld: 154 mg/dL — ABNORMAL HIGH (ref 65–99)
Potassium: 3.5 mmol/L (ref 3.5–5.1)
SODIUM: 145 mmol/L (ref 135–145)
Total Bilirubin: 0.5 mg/dL (ref 0.3–1.2)
Total Protein: 4.3 g/dL — ABNORMAL LOW (ref 6.5–8.1)

## 2017-10-02 LAB — GLUCOSE, CAPILLARY
GLUCOSE-CAPILLARY: 161 mg/dL — AB (ref 65–99)
GLUCOSE-CAPILLARY: 194 mg/dL — AB (ref 65–99)
GLUCOSE-CAPILLARY: 305 mg/dL — AB (ref 65–99)
Glucose-Capillary: 178 mg/dL — ABNORMAL HIGH (ref 65–99)
Glucose-Capillary: 246 mg/dL — ABNORMAL HIGH (ref 65–99)
Glucose-Capillary: 261 mg/dL — ABNORMAL HIGH (ref 65–99)

## 2017-10-02 LAB — CBC
HCT: 30.7 % — ABNORMAL LOW (ref 36.0–46.0)
Hemoglobin: 10.3 g/dL — ABNORMAL LOW (ref 12.0–15.0)
MCH: 30.4 pg (ref 26.0–34.0)
MCHC: 33.6 g/dL (ref 30.0–36.0)
MCV: 90.6 fL (ref 78.0–100.0)
PLATELETS: 285 10*3/uL (ref 150–400)
RBC: 3.39 MIL/uL — ABNORMAL LOW (ref 3.87–5.11)
RDW: 14.3 % (ref 11.5–15.5)
WBC: 13.7 10*3/uL — ABNORMAL HIGH (ref 4.0–10.5)

## 2017-10-02 LAB — MAGNESIUM: Magnesium: 1.8 mg/dL (ref 1.7–2.4)

## 2017-10-02 LAB — DIFFERENTIAL
Basophils Absolute: 0 10*3/uL (ref 0.0–0.1)
Basophils Relative: 0 %
EOS ABS: 0.2 10*3/uL (ref 0.0–0.7)
EOS PCT: 1 %
Lymphocytes Relative: 7 %
Lymphs Abs: 0.9 10*3/uL (ref 0.7–4.0)
MONO ABS: 1.2 10*3/uL — AB (ref 0.1–1.0)
Monocytes Relative: 9 %
Neutro Abs: 11.4 10*3/uL — ABNORMAL HIGH (ref 1.7–7.7)
Neutrophils Relative %: 83 %

## 2017-10-02 LAB — BASIC METABOLIC PANEL
Anion gap: 7 (ref 5–15)
BUN: 11 mg/dL (ref 6–20)
CHLORIDE: 106 mmol/L (ref 101–111)
CO2: 31 mmol/L (ref 22–32)
CREATININE: 0.77 mg/dL (ref 0.44–1.00)
Calcium: 7.6 mg/dL — ABNORMAL LOW (ref 8.9–10.3)
GFR calc Af Amer: >60 mL/min (ref >60)
GFR calc non Af Amer: >60 mL/min (ref >60)
Glucose, Bld: 216 mg/dL — ABNORMAL HIGH (ref 65–99)
Potassium: 3.6 mmol/L (ref 3.5–5.1)
Sodium: 144 mmol/L (ref 135–145)

## 2017-10-02 LAB — TRIGLYCERIDES: Triglycerides: 149 mg/dL (ref <150)

## 2017-10-02 LAB — PROCALCITONIN: Procalcitonin: 1.91 ng/mL

## 2017-10-02 LAB — PHOSPHORUS: PHOSPHORUS: 1.4 mg/dL — AB (ref 2.5–4.6)

## 2017-10-02 LAB — PREALBUMIN

## 2017-10-02 LAB — TROPONIN I: TROPONIN I: 0.25 ng/mL — AB (ref <0.03)

## 2017-10-02 MED ORDER — DEXTROSE 5 % IV SOLN
40.0000 mmol | Freq: Once | INTRAVENOUS | Status: AC
  Administered 2017-10-02: 40 mmol via INTRAVENOUS
  Filled 2017-10-02: qty 13.33

## 2017-10-02 MED ORDER — TRAVASOL 10 % IV SOLN
INTRAVENOUS | Status: AC
  Administered 2017-10-02: 18:00:00 via INTRAVENOUS
  Filled 2017-10-02: qty 512.4

## 2017-10-02 MED ORDER — MAGNESIUM SULFATE 2 GM/50ML IV SOLN
2.0000 g | Freq: Once | INTRAVENOUS | Status: AC
  Administered 2017-10-02: 2 g via INTRAVENOUS
  Filled 2017-10-02: qty 50

## 2017-10-02 MED ORDER — FUROSEMIDE 10 MG/ML IJ SOLN
20.0000 mg | Freq: Once | INTRAMUSCULAR | Status: AC
  Administered 2017-10-02: 20 mg via INTRAVENOUS
  Filled 2017-10-02: qty 2

## 2017-10-02 MED ORDER — TRAVASOL 10 % IV SOLN
INTRAVENOUS | Status: DC
  Filled 2017-10-02: qty 546

## 2017-10-02 MED ORDER — PANTOPRAZOLE SODIUM 40 MG IV SOLR
40.0000 mg | INTRAVENOUS | Status: DC
  Administered 2017-10-02 – 2017-10-04 (×3): 40 mg via INTRAVENOUS
  Filled 2017-10-02 (×3): qty 40

## 2017-10-02 MED ORDER — ACETAMINOPHEN 650 MG RE SUPP
650.0000 mg | Freq: Four times a day (QID) | RECTAL | Status: DC | PRN
  Filled 2017-10-02 (×2): qty 1

## 2017-10-02 MED ORDER — SODIUM CHLORIDE 0.9 % IV SOLN
1.0000 g | Freq: Three times a day (TID) | INTRAVENOUS | Status: DC
  Administered 2017-10-02 – 2017-10-07 (×15): 1 g via INTRAVENOUS
  Filled 2017-10-02 (×17): qty 1

## 2017-10-02 NOTE — Progress Notes (Signed)
PHARMACY - ADULT TOTAL PARENTERAL NUTRITION CONSULT NOTE   Pharmacy Consult for TPN Indication: Prolonged Ileus  Patient Measurements: Height: 5\' 3"  (160 cm) Weight: 150 lb 12.7 oz (68.4 kg) IBW/kg (Calculated) : 52.4 TPN AdjBW (KG): 64.8 Body mass index is 26.71 kg/m.  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 Significant contamination was noted with high suspicion for post-op ileus. Pharmacy was consulted for TPN.   Patient unable to provide history currently. Daughter reports patient has taken in little over the past 2 weeks due to pain and nausea. She states patient was watching weight and meticulous about meal intake.   GI: Concern for post-op ileus. No further surgeries planned at this time. NGT output 300 mL. LBM 5/10.  Endo: DM on Lantus 20 qd + SSI at home. CBGs currently 154-216 but trending down since TPN started on SSI + 20 units of regular insulin in TPN. Insulin requirements in the past 24 hours: 23 units/24hrs (14 units since new TPN bag hung).  Lytes:  K 3.5, Mg 1.8, Phos dropped 1.4, all others wnl. CoCa 9.6. Renal: SCr 0.76 (stable), BUN 11, UOP 0.4 mL/kg/hr (decreased). D5-1/2NS at [facility]. Pulm: 6L [place] Cards: BP elevated, Afib - rate in 80s on Amiodarone at 30 mg/hr. Metoprolol 2.5mg  IV q6h. Hepatobil: LFTs/Tbili/TG wnl Neuro: RASS -2 ID: Merrem for perforated intra-abdominal infection. WBC down 13.7, Tm 101.1  TPN Access: PICC 09/28/16 TPN start date: 10/01/17 Nutritional Goals (per RD recommendations 10/01/17): KCal: 1522  Protein: 102-115 Fluid: per MD  Goal TPN rate is 70 ml/hr (provides 100% of needs)  Current Nutrition:  NPO TPN  Plan:  Continue TPN at 35 mL/hr - will not increase rate until lytes and CBGs controlled.  Hold 20% lipid emulsion for first 7 days for ICU patients per ASPEN guidelines (Start  date 5/17) This TPN provides 54.6 g of protein, 168 g of dextrose, and 0 g of lipids which provides 790 kCals per day, meeting 52% of patient needs Electrolytes in TPN: Increase Phos further (21 mmol/d), others standard for now Add MVI and trace elements Add 20 units of regular insulin to TPN Change to moderate SSI  q4h and adjust as needed Monitor TPN labs, recheck in AM  KPhos 40 mmol x1 today.  Mag 2g IV x1 today.   Sloan Leiter, PharmD, BCPS, BCCCP Clinical Pharmacist Clinical phone 10/02/2017 until 3:[phone] After hours, please call [phone] 10/02/2017,7:44 AM

## 2017-10-02 NOTE — Progress Notes (Signed)
[place] notified of patients temp 101.1 orally. Extra blanket taken off patient. Ice packs applied per daughter request. Awaiting new orders. Will continue to monitor.

## 2017-10-02 NOTE — Progress Notes (Addendum)
PULMONARY / CRITICAL CARE MEDICINE   Name: Barbara [name] MRN: [id number] DOB: 01/13/1941    ADMISSION DATE:  09/26/2017 CONSULTATION DATE:  rapid wide-complex tachycardia  REFERRING MD:  Redmond Pulling  CHIEF COMPLAINT:  Sepsis and septic shock  BRIEF 77 year old female with prior history of perforated sigmoid diverticulitis.  Was actually discharged on 5/6 on antibiotics and analgesia.  Over the following 24 hours developed worsening abdominal pain, nausea and vomiting.  Repeat abdomen CT demonstrated larger abdominal abscess with cavitation.  She had rising white blood cell count as well as hypotension which initially was responsive to fluid.  She also has a history of paroxysmal atrial fibrillation but not on anticoagulation due to prior GI bleeding.  She is maintained on flecainide.  The a.m. 5/9 demonstrated complex tachycardia with a rate in the 200s requiring synchronized cardioversion.  She is now on amiodarone infusion, over the morning she has had intermittent hypotension and because of her severe sepsis critical care has been asked to consult    EVENTS Right upper extremity PICC line 09/26/17 - at [place] abdomen 5/7: Acute perforated diverticulitis. Pericolonic abscess extending between the bladder and uterus has increased in size and surrounding inflammation. Contrast and debris extends into the abscess cavity.   Urine culture 5/6 was negative   5/7 meropenem>>> Had been on antibiotics since 5/3 prior.     5/10./19 - Postop, hemodynamically stable.  Renal function improved.  Has developed some mild delirium since surgery also mild increase in oxygen need otherwise doing well  10/01/17 - rising wbc. Low Phos and getting repleted. CCS starting TPN. Overnight and throught his AM per rN - worsening delirium (yesterday only memory issues), lethargic, more labored respiratoion continues to be on 6L [place] like yesterday but pulse ox 89-91% per RN. Febrile to 100.58F . Dauighter wondring  if intubation would help patient and feels patient is post op pain. cXR worse. On amio gtt - seen by cards this AM    SUBJECTIVE/OVERNIGHT/INTERVAL HX 10/02/17 - intubated due to worsening ALI/fluid overload, delirium yesterday. FErile 1001F. WBC down. On TPN since yesterday. Making urine. Mag/phos being repleted. Not on pressors. On fent gtt -> curently off for WUA. On 40% fio2/peep 5  VITAL SIGNS: BP (!) 148/53   Pulse 81   Temp 99.9 F (37.7 C) (Oral)   Resp (!) 25   Ht 5\' 3"  (1.6 m)   Wt 68.4 kg (150 lb 12.7 oz)   SpO2 94%   BMI 26.71 kg/m   Liters nasal cannula HEMODYNAMICS:    VENTILATOR SETTINGS: Vent Mode: PRVC FiO2 (%):  [40 %-100 %] 40 % Set Rate:  [16 bmp] 16 bmp Vt Set:  [420 mL-440 mL] 420 mL PEEP:  [5 cmH20] 5 cmH20 Plateau Pressure:  [19 cmH20-20 cmH20] 20 cmH20  INTAKE / OUTPUT: I/O last 3 completed shifts: In: 7829 [I.V.:3275; IV Piggyback:2055] Out: 1680 [Urine:980; Emesis/NG output:700]  PHYSICAL EXAMINATION: General Appearance:    Looks criticall ill OBESE - +  Head:    Normocephalic, without obvious abnormality, atraumatic  Eyes:    PERRL - yes, conjunctiva/corneas - clear      Ears:    Normal external ear canals, both ears  Nose:   NG tube - no  Throat:  ETT TUBE - yes , OG tube - yes  Neck:   Supple,  No enlargement/tenderness/nodules     Lungs:     Significant crackles +, Ventilator   Synchrony - yes  Chest wall:  No deformity  Heart:    S1 and S2 normal, no murmur, CVP - no.  Pressors - no  Abdomen:     Soft, no masses, no organomegaly. Diffusely non specific tender  Genitalia:    Not done  Rectal:   not done  Extremities:   Extremities- intact     Skin:   Intact in exposed areas . Sacral area - no decub per report     Neurologic:   Sedation - fent gtt turned off 51minutes -> RASS - -3 . Moves all 4s - yes. CAM-ICU - na . Orientation - na       PULMONARY Recent Labs  Lab 09/29/17 1448 09/29/17 1644 09/29/17 1729 09/29/17 1804  10/01/17 1403  PHART 7.284* 7.257* 7.334* 7.363 7.361  PCO2ART 28.6* 38.6 38.8 39.8 55.5*  PO2ART 73.0* 243.0* 112.0* 112.0* 98.0  HCO3 13.5* 17.2* 20.7 22.7 31.0*  TCO2 14* 18* 22 24 33*  O2SAT 93.0 100.0 98.0 98.0 97.0    CBC Recent Labs  Lab 09/30/17 0538 10/01/17 0402 10/02/17 0425  HGB 11.0* 10.2* 10.3*  HCT 33.5* 31.3* 30.7*  WBC 16.6* 18.7* 13.7*  PLT 356 325 285    COAGULATION Recent Labs  Lab 09/27/17 0658  INR 1.03    CARDIAC   Recent Labs  Lab 09/26/17 2207 09/28/17 0848 09/28/17 1419 10/01/17 1135 10/02/17 0425  TROPONINI <0.03 0.03* 0.36* 0.35* 0.25*   No results for input(s): PROBNP in the last 168 hours.   CHEMISTRY Recent Labs  Lab 09/28/17 0848  09/29/17 3785  09/30/17 1157 10/01/17 0402 10/01/17 1135 10/01/17 1917 10/02/17 0032 10/02/17 0425  NA  --    < >  --    < >  --  144 144 139 144 145  K  --    < >  --    < >  --  4.0 3.9 3.6 3.6 3.5  CL  --    < >  --    < >  --  109 109 102 106 107  CO2  --    < >  --    < >  --  29 29 31 31 31   GLUCOSE  --    < >  --    < >  --  180* 155* 243* 216* 154*  BUN  --    < >  --    < >  --  13 12 11 11 11   CREATININE  --    < >  --    < >  --  0.88 0.91 0.80 0.77 0.76  CALCIUM  --    < >  --    < >  --  7.7* 7.6* 7.3* 7.6* 7.6*  MG 1.8  --  2.6*  --  2.1 2.2  --   --   --  1.8  PHOS  --   --   --   --  1.4* 1.7*  --   --   --  1.4*   < > = values in this interval not displayed.   Estimated Creatinine Clearance: 57.3 mL/min (by C-G formula based on SCr of 0.76 mg/dL).   LIVER Recent Labs  Lab 09/26/17 2207 09/27/17 0609 09/27/17 0658 10/01/17 1135 10/02/17 0425  AST 27 24  --  19 15  ALT 19 16  --  14 12*  ALKPHOS 100 87  --  69 65  BILITOT 0.7 0.7  --  0.3 0.5  PROT 6.9 5.9*  --  4.5* 4.3*  ALBUMIN 3.5 2.9*  --  1.7* 1.5*  INR  --   --  1.03  --   --      INFECTIOUS Recent Labs  Lab 09/27/17 0029 09/29/17 1300 10/01/17 1135 10/02/17 0425  LATICACIDVEN 1.0 1.5 1.4  --    PROCALCITON  --   --  2.72 1.91     ENDOCRINE CBG (last 3)  Recent Labs    10/02/17 0018 10/02/17 0406 10/02/17 0819  GLUCAP 194* 161* 178*         IMAGING x48h  - image(s) personally visualized  -   highlighted in bold Dg Chest Port 1 View  Result Date: 10/01/2017 CLINICAL DATA:  Verify endotracheal tube. EXAM: PORTABLE CHEST 1 VIEW COMPARISON:  10/01/2017 FINDINGS: Endotracheal tube is in place with tip approximately 2.9 centimeters above the carina. Nasogastric tube is in place, tip overlying the level of the stomach and side-port in the region the gastroesophageal junction. A RIGHT-sided PICC line tip overlies the superior vena cava. Heart size is normal. There are airspace filling opacities bilaterally with central air bronchograms. There are small bilateral pleural effusions. IMPRESSION: 1. Interval placement of endotracheal tube, tip to be in good position. 2. Persistent bilateral airspace filling opacities and pleural effusions, similar to prior study. Electronically Signed   By: Nolon Nations M.D.   On: 10/01/2017 13:35   Dg Chest Port 1 View  Result Date: 10/01/2017 CLINICAL DATA:  Respiratory distress.  Follow-up. EXAM: PORTABLE CHEST 1 VIEW COMPARISON:  09/30/2017 FINDINGS: Nasogastric tube enters the abdomen. Right arm PICC tip is in the SVC above the right atrium. Widespread pulmonary opacity has worsened, consistent with progressive edema. Small effusions and dependent atelectasis. IMPRESSION: Progressive/worsened diffuse edema pattern. Electronically Signed   By: Nelson Chimes M.D.   On: 10/01/2017 11:33      DISCUSSION: Perforated diverticulitis with abscess, resultant septic shock and mods as well as DKA.  She is now status post exploratory laparotomy with sigmoid colectomy and transverse colectomy.  Hemodynamically stable anion gap is closed from a DKA standpoint renal functions normalized.  She does have some mild delirium and hypoxia today however ultimately  seems to be doing well  ASSESSMENT / PLAN:  Severe sepsis, septic shock in the setting of perforated diverticulitis with new abscess, now postoperative day #1 for exploratory lap with sigmoid colectomy and transverse and colostomy on 5/9. -There is marked abdominal contamination per surgical note  10/02/2017 - off pressors  Plan N.p.o./bowel rest Wound care and ostomy care per surgical services CCS starting TPN   #RESP  A 10/02/2017 - > does not meet criteria for SBT/Extubation in setting of Acute Respiratory Failure due to pain, delirium and ALI +/- volume overload +/- HCAP   Plan PRVC SBT when ready Lasix x 1 and if responds, give more lasix CXR 10/02/17  #CVS WCT w/ h/o PAF w/ h/o CAD->required cardioversion X 1 -EF 65-70%; No new WM abnormality  \ 10/01/17 - holding BP/HR, Onb Amio gtt to continue per cards 09/30/17 and to transiiton to po when can use her gut 10/02/17 - still on amio gtt  Plan Continue telemetry monitoring Cards to opine on definite need for amio given possible ALI esp if she does not respond to lasix   #ENDOCRINE Diabetic ketoacidosis on transer to [facility].  Anion gap is closed and bic improved > 20 on 09/30/17  Plan ICU hyperglycemia protocol with tPN   #RENAL Recent Labs  Lab 10/01/17 0402 10/01/17 1135 10/01/17 1917 10/02/17 0032 10/02/17 0425  CREATININE 0.88 0.91 0.80 0.77 0.76    AKI in the setting of sepsis.  Resolved 10/01/17  10/02/17 - low mag and phos being repleted   PLAN monitor  #CNS A: delirium metabolic and post op pain  P PAD protocol post intubation    Anemia of critical illness -No evidence of postoperative bleeding Plan Trend CBC - PRBC for hgb </= 6.9gm%    - exceptions are   -  if ACS susepcted/confirmed then transfuse for hgb </= 8.0gm%,  or    -  active bleeding with hemodynamic instability, then transfuse regardless of hemoglobin value   At at all times try to transfuse 1 unit prbc as possible with  exception of active hemorrhage         DVT prophylaxis: sub-Cutaneous heparin SUP: PPI   Diet: NPO . TPN Activity: BR Disposition : ICU   FAMILY  - daughter very concerned about mom's health. Updated extensiely 09/21/17.  Husband, daughter and son in law updated 10/02/17      The patient is critically ill with multiple organ systems failure and requires high complexity decision making for assessment and support, frequent evaluation and titration of therapies, application of advanced monitoring technologies and extensive interpretation of multiple databases.   Critical Care Time devoted to patient care services described in this note is  30  Minutes. This time reflects time of care of this signee Dr Brand Males. This critical care time does not reflect procedure time, or teaching time or supervisory time of PA/NP/Med student/Med Resident etc but could involve care discussion time    Dr. Brand Males, M.D., [facility].C.P Pulmonary and Critical Care Medicine Staff Physician [place] [facility] Pager: [phone], If no answer or between  15:00h - 7:00h: call [phone]  10/02/2017 10:38 AM

## 2017-10-02 NOTE — Progress Notes (Signed)
Pharmacy Antibiotic Note  Barbara [name] is a 77 y.o. female admitted on 09/26/2017 with intra-abdominal infection.  Pharmacy has been consulted for MEROPENEM dosing. Penicillin allergy noted. Pt intubated yesterday and Tmax is up to 101.1. WBC is elevated but trending down. Scr has improved to 0.76 requiring dose adjustment.   Plan: Change meropenem to 1gm IV Q8H F/u renal fxn, C&S, clinical status and LOT  Height: 5\' 3"  (160 cm) Weight: 150 lb 12.7 oz (68.4 kg) IBW/kg (Calculated) : 52.4  Temp (24hrs), Avg:99.7 F (37.6 C), Min:99 F (37.2 C), Max:101.1 F (38.4 C)  Recent Labs  Lab 09/26/17 2207 09/27/17 0029  09/28/17 0412 09/29/17 0530 09/29/17 1300  09/30/17 0538  10/01/17 0402 10/01/17 1135 10/01/17 1917 10/02/17 0032 10/02/17 0425  WBC 14.1*  --   --  18.8* 19.2*  --   --  16.6*  --  18.7*  --   --   --  13.7*  CREATININE 0.98  --    < > 0.86 1.51*  --    < > 1.17*   < > 0.88 0.91 0.80 0.77 0.76  LATICACIDVEN 1.5 1.0  --   --   --  1.5  --   --   --   --  1.4  --   --   --    < > = values in this interval not displayed.    Estimated Creatinine Clearance: 57.3 mL/min (by C-G formula based on SCr of 0.76 mg/dL).    Antimicrobials this admission: Cipro and Flagyl 5/7 >> 5/7 Meropenem 5/7 >>   Microbiology results: 5/6 UCx: MRSA 5/7 MRSA PCR: MRSA  Thank you for allowing pharmacy to be a part of this patient's care.  Khyren Hing, Rande Lawman 10/02/2017 10:44 AM

## 2017-10-02 NOTE — Progress Notes (Signed)
Progress Note: General Surgery Service   Assessment/Plan: Patient Active Problem List   Diagnosis Date Noted  . Diabetic acidosis without coma ([place])   . Wide Complex Tachycardia/H/o SVT and H/o Afib 09/29/2017  . Sepsis ([place]) 09/29/2017  . Colonic diverticular abscess 09/27/2017  . Diverticulitis of both large and small intestine with perforation and abscess without bleeding 09/24/2017  . Unspecified atrial fibrillation ([place]) 09/23/2017  . Dizziness 07/28/2015  . Orthostatic hypotension 07/28/2015  . Rectal bleeding 12/05/2013  . Melena 12/05/2013  . Personal history of colonic polyps 12/05/2013  . Rapid atrial fibrillation ([place]) 08/15/2013  . Atrial fibrillation with RVR ([place]) 08/15/2013  . Right upper quadrant pain 11/16/2011  . Abdominal pain, acute, left lower quadrant 09/01/2011  . Cerebrovascular disease 10/09/2010  . Hypertension   . Hyperlipidemia   . Diabetes mellitus ([place])   . Arteriosclerotic cardiovascular disease (ASCVD)   . Macular degeneration   . Tobacco abuse   . Asthma   . Palpitations   . Chest pain    s/p Procedure(s): EXPLORATORY LAPAROTOMY EXTENDED LEFT HEMI COLECTOMY WITH COLOSTOMY CREATION ([facility] PROCEDURE) 09/29/2017 -continue TPN -continue wound care -continue antibiotics    LOS: 5 days  Chief Complaint/Subjective: Moving more, febrile overnight  Objective: Vital signs in last 24 hours: Temp:  [99 F (37.2 C)-101.1 F (38.4 C)] 99.9 F (37.7 C) (05/12 0600) Pulse Rate:  [69-87] 77 (05/12 0700) Resp:  [16-33] 19 (05/12 0700) BP: (119-168)/(45-90) 148/51 (05/12 0700) SpO2:  [91 %-100 %] 93 % (05/12 0700) FiO2 (%):  [40 %-100 %] 40 % (05/12 0400) Last BM Date: 09/30/17  Intake/Output from previous day: 05/11 0701 - 05/12 0700 In: 4129.6 [I.V.:2174.6; IV Piggyback:1955] Out: 995 [Urine:695; Emesis/NG output:300] Intake/Output this shift: No intake/output data recorded.  Cardiovascular: RRR  Abd: soft, incision open with small  amount of fibrinous debris at base, ostomy in place with small amount of air in bag  Extremities: trace edema  Neuro: GCS 6t  Lab Results: CBC  Recent Labs    10/01/17 0402 10/02/17 0425  WBC 18.7* 13.7*  HGB 10.2* 10.3*  HCT 31.3* 30.7*  PLT 325 285   BMET Recent Labs    10/02/17 0032 10/02/17 0425  NA 144 145  K 3.6 3.5  CL 106 107  CO2 31 31  GLUCOSE 216* 154*  BUN 11 11  CREATININE 0.77 0.76  CALCIUM 7.6* 7.6*   PT/INR No results for input(s): LABPROT, INR in the last 72 hours. ABG Recent Labs    09/29/17 1804 10/01/17 1403  PHART 7.363 7.361  HCO3 22.7 31.0*    Studies/Results:  Anti-infectives: Anti-infectives (From admission, onward)   Start     Dose/Rate Route Frequency Ordered Stop   09/27/17 1200  meropenem (MERREM) 1 g in sodium chloride 0.9 % 100 mL IVPB     1 g 200 mL/hr over 30 Minutes Intravenous Every 12 hours 09/27/17 1032     09/27/17 1000  ciprofloxacin (CIPRO) IVPB 400 mg  Status:  Discontinued     400 mg 200 mL/hr over 60 Minutes Intravenous Every 12 hours 09/27/17 0547 09/27/17 1029   09/27/17 0800  metroNIDAZOLE (FLAGYL) IVPB 500 mg  Status:  Discontinued     500 mg 100 mL/hr over 60 Minutes Intravenous Every 8 hours 09/27/17 0547 09/27/17 1030   09/26/17 2315  ciprofloxacin (CIPRO) IVPB 400 mg     400 mg 200 mL/hr over 60 Minutes Intravenous  Once 09/26/17 2304 09/27/17 0032   09/26/17 2315  metroNIDAZOLE (FLAGYL) IVPB 500 mg     500 mg 100 mL/hr over 60 Minutes Intravenous  Once 09/26/17 2304 09/27/17 0229      Medications: Scheduled Meds: . brimonidine  1 drop Both Eyes TID  . chlorhexidine  15 mL Mouth Rinse BID  . Chlorhexidine Gluconate Cloth  6 each Topical Daily  . heparin injection (subcutaneous)  5,000 Units Subcutaneous Q8H  . insulin aspart  0-15 Units Subcutaneous Q4H  . mouth rinse  15 mL Mouth Rinse q12n4p  . metoprolol tartrate  2.5 mg Intravenous Q6H   Continuous Infusions: . amiodarone 30 mg/hr  (10/02/17 0600)  . dextrose 5 % and 0.45% NaCl 10 mL/hr at 10/02/17 0600  . fentaNYL infusion INTRAVENOUS 50 mcg/hr (10/02/17 0600)  . insulin (NOVOLIN-R) infusion Stopped (10/01/17 1153)  . meropenem (MERREM) IV Stopped (10/01/17 2048)  . methocarbamol (ROBAXIN)  IV    . TPN ADULT (ION) 35 mL/hr at 10/02/17 0600   PRN Meds:.acetaminophen, fentaNYL, fentaNYL (SUBLIMAZE) injection, fentaNYL (SUBLIMAZE) injection, fentaNYL (SUBLIMAZE) injection, LORazepam, methocarbamol (ROBAXIN)  IV, midazolam, midazolam, ondansetron **OR** ondansetron (ZOFRAN) IV, sodium chloride flush, white petrolatum  Mickeal Skinner, MD Pg# [phone] [facility]

## 2017-10-03 ENCOUNTER — Inpatient Hospital Stay (HOSPITAL_COMMUNITY): Payer: Medicare Other

## 2017-10-03 DIAGNOSIS — E138 Other specified diabetes mellitus with unspecified complications: Secondary | ICD-10-CM

## 2017-10-03 DIAGNOSIS — J810 Acute pulmonary edema: Secondary | ICD-10-CM

## 2017-10-03 DIAGNOSIS — Z794 Long term (current) use of insulin: Secondary | ICD-10-CM

## 2017-10-03 LAB — CBC WITH DIFFERENTIAL/PLATELET
BASOS PCT: 0 %
Basophils Absolute: 0 10*3/uL (ref 0.0–0.1)
EOS PCT: 2 %
Eosinophils Absolute: 0.3 10*3/uL (ref 0.0–0.7)
HCT: 28.2 % — ABNORMAL LOW (ref 36.0–46.0)
Hemoglobin: 9.4 g/dL — ABNORMAL LOW (ref 12.0–15.0)
LYMPHS ABS: 1.4 10*3/uL (ref 0.7–4.0)
Lymphocytes Relative: 11 %
MCH: 29.3 pg (ref 26.0–34.0)
MCHC: 33.3 g/dL (ref 30.0–36.0)
MCV: 87.9 fL (ref 78.0–100.0)
MONO ABS: 1.5 10*3/uL — AB (ref 0.1–1.0)
Monocytes Relative: 12 %
NEUTROS ABS: 9.7 10*3/uL — AB (ref 1.7–7.7)
Neutrophils Relative %: 75 %
Platelets: 284 10*3/uL (ref 150–400)
RBC: 3.21 MIL/uL — ABNORMAL LOW (ref 3.87–5.11)
RDW: 13.7 % (ref 11.5–15.5)
WBC: 12.9 10*3/uL — ABNORMAL HIGH (ref 4.0–10.5)

## 2017-10-03 LAB — BASIC METABOLIC PANEL
Anion gap: 7 (ref 5–15)
BUN: 11 mg/dL (ref 6–20)
CO2: 35 mmol/L — AB (ref 22–32)
Calcium: 7.2 mg/dL — ABNORMAL LOW (ref 8.9–10.3)
Chloride: 98 mmol/L — ABNORMAL LOW (ref 101–111)
Creatinine, Ser: 0.77 mg/dL (ref 0.44–1.00)
GFR calc Af Amer: >60 mL/min (ref >60)
GLUCOSE: 221 mg/dL — AB (ref 65–99)
POTASSIUM: 3 mmol/L — AB (ref 3.5–5.1)
Sodium: 140 mmol/L (ref 135–145)

## 2017-10-03 LAB — COMPREHENSIVE METABOLIC PANEL
ALT: 15 U/L (ref 14–54)
ANION GAP: 7 (ref 5–15)
AST: 35 U/L (ref 15–41)
Albumin: 1.3 g/dL — ABNORMAL LOW (ref 3.5–5.0)
Alkaline Phosphatase: 67 U/L (ref 38–126)
BILIRUBIN TOTAL: 0.4 mg/dL (ref 0.3–1.2)
BUN: 13 mg/dL (ref 6–20)
CALCIUM: 7.4 mg/dL — AB (ref 8.9–10.3)
CO2: 34 mmol/L — AB (ref 22–32)
CREATININE: 0.75 mg/dL (ref 0.44–1.00)
Chloride: 100 mmol/L — ABNORMAL LOW (ref 101–111)
GFR calc non Af Amer: >60 mL/min (ref >60)
GLUCOSE: 140 mg/dL — AB (ref 65–99)
Potassium: 3.7 mmol/L (ref 3.5–5.1)
Sodium: 141 mmol/L (ref 135–145)
Total Protein: 4.4 g/dL — ABNORMAL LOW (ref 6.5–8.1)

## 2017-10-03 LAB — GLUCOSE, CAPILLARY
GLUCOSE-CAPILLARY: 139 mg/dL — AB (ref 65–99)
GLUCOSE-CAPILLARY: 186 mg/dL — AB (ref 65–99)
GLUCOSE-CAPILLARY: 197 mg/dL — AB (ref 65–99)
GLUCOSE-CAPILLARY: 264 mg/dL — AB (ref 65–99)
Glucose-Capillary: 191 mg/dL — ABNORMAL HIGH (ref 65–99)
Glucose-Capillary: 113 mg/dL — ABNORMAL HIGH (ref 65–99)
Glucose-Capillary: 123 mg/dL — ABNORMAL HIGH (ref 65–99)
Glucose-Capillary: 206 mg/dL — ABNORMAL HIGH (ref 65–99)

## 2017-10-03 LAB — BPAM RBC
BLOOD PRODUCT EXPIRATION DATE: 201906032359
BLOOD PRODUCT EXPIRATION DATE: 201906032359
ISSUE DATE / TIME: 201905091745
ISSUE DATE / TIME: 201905091852
Unit Type and Rh: 5100
Unit Type and Rh: 5100

## 2017-10-03 LAB — TYPE AND SCREEN
ABO/RH(D): O POS
ANTIBODY SCREEN: NEGATIVE
UNIT DIVISION: 0
UNIT DIVISION: 0

## 2017-10-03 LAB — PHOSPHORUS: PHOSPHORUS: 2.4 mg/dL — AB (ref 2.5–4.6)

## 2017-10-03 LAB — PROCALCITONIN: Procalcitonin: 0.87 ng/mL

## 2017-10-03 LAB — PREALBUMIN: Prealbumin: <5 mg/dL — ABNORMAL LOW (ref 18–38)

## 2017-10-03 LAB — SEDIMENTATION RATE: Sed Rate: 108 mm/hr — ABNORMAL HIGH (ref 0–22)

## 2017-10-03 LAB — TRIGLYCERIDES: TRIGLYCERIDES: 121 mg/dL (ref <150)

## 2017-10-03 LAB — MAGNESIUM: MAGNESIUM: 1.9 mg/dL (ref 1.7–2.4)

## 2017-10-03 MED ORDER — CHLORHEXIDINE GLUCONATE 0.12% ORAL RINSE (MEDLINE KIT)
15.0000 mL | Freq: Two times a day (BID) | OROMUCOSAL | Status: DC
  Administered 2017-10-03 – 2017-10-04 (×2): 15 mL via OROMUCOSAL

## 2017-10-03 MED ORDER — POTASSIUM PHOSPHATES 15 MMOLE/5ML IV SOLN
10.0000 mmol | Freq: Once | INTRAVENOUS | Status: AC
  Administered 2017-10-03: 10 mmol via INTRAVENOUS
  Filled 2017-10-03: qty 3.33

## 2017-10-03 MED ORDER — ORAL CARE MOUTH RINSE
15.0000 mL | OROMUCOSAL | Status: DC
  Administered 2017-10-03 – 2017-10-04 (×10): 15 mL via OROMUCOSAL

## 2017-10-03 MED ORDER — FUROSEMIDE 10 MG/ML IJ SOLN
40.0000 mg | Freq: Three times a day (TID) | INTRAMUSCULAR | Status: AC
  Administered 2017-10-03 (×2): 40 mg via INTRAVENOUS
  Filled 2017-10-03 (×2): qty 4

## 2017-10-03 MED ORDER — POTASSIUM CHLORIDE 10 MEQ/50ML IV SOLN
10.0000 meq | INTRAVENOUS | Status: AC
  Administered 2017-10-03 (×4): 10 meq via INTRAVENOUS
  Filled 2017-10-03 (×4): qty 50

## 2017-10-03 MED ORDER — TRAVASOL 10 % IV SOLN
INTRAVENOUS | Status: AC
  Administered 2017-10-03: 17:00:00 via INTRAVENOUS
  Filled 2017-10-03: qty 732

## 2017-10-03 NOTE — Consult Note (Addendum)
[facility] Nurse ostomy follow up Surgical team following for assessment and plan of care to abd wound.  Stoma type/location:  Patient had colostomy surgery performed 5/9 Stomal assessment/size: Stoma is red and viable, slightly above skin level, 1 1/4 inches Peristomal assessment: Intact skin surrounding Output: No stool or flatus at this time, small amt brown drainage Ostomy pouching: 2pc.  Education provided:  Pt is on the vent; demonstrated pouch change to husband at the bedside. Applied barrier ring and two piece pouch to attempt to maintain a seal.  Supplies ordered to the room for staff nurse use if leakage occurs.  [place] team will perform another pouch change and teaching session when stable and out of ICU.  Enrolled patient in [place] Start Discharge program: No Julien Girt MSN, [place], [place] Sam, [place]

## 2017-10-03 NOTE — Progress Notes (Signed)
Patient ID: Barbara [name], female   DOB: [birth year], 77 y.o.   MRN: [id number]    4 Days Post-Op  Subjective: Pt intubated, but hoping to possibly wean to extubate today.  Opens her eyes to her name  Objective: Vital signs in last 24 hours: Temp:  [99.1 F (37.3 C)-100.3 F (37.9 C)] 99.3 F (37.4 C) (05/13 0800) Pulse Rate:  [69-88] 81 (05/13 0800) Resp:  [18-35] 23 (05/13 0800) BP: (118-152)/(39-131) 146/55 (05/13 0817) SpO2:  [90 %-98 %] 95 % (05/13 0817) FiO2 (%):  [40 %-50 %] 40 % (05/13 0817) Weight:  [71.3 kg (157 lb 3 oz)] 71.3 kg (157 lb 3 oz) (05/13 0600) Last BM Date: 09/30/17  Intake/Output from previous day: 05/12 0701 - 05/13 0700 In: 1898.1 [I.V.:1348.1; IV Piggyback:550] Out: 2875 [Urine:2575; Emesis/NG output:300] Intake/Output this shift: Total I/O In: 61.7 [I.V.:61.7] Out: 75 [Urine:75]  PE: Abd: soft, +BS, minimal NGT output, slightly bloody, 300cc recorded, ostomy with air present, no definitive feculent output present.  Stoma is pink and viable.  Midline wound is clean and packed.  Lab Results:  Recent Labs    10/02/17 0425 10/03/17 0601  WBC 13.7* 12.9*  HGB 10.3* 9.4*  HCT 30.7* 28.2*  PLT 285 284   BMET Recent Labs    10/03/17 0045 10/03/17 0601  NA 140 141  K 3.0* 3.7  CL 98* 100*  CO2 35* 34*  GLUCOSE 221* 140*  BUN 11 13  CREATININE 0.77 0.75  CALCIUM 7.2* 7.4*   PT/INR No results for input(s): LABPROT, INR in the last 72 hours. CMP     Component Value Date/Time   NA 141 10/03/2017 0601   K 3.7 10/03/2017 0601   CL 100 (L) 10/03/2017 0601   CO2 34 (H) 10/03/2017 0601   GLUCOSE 140 (H) 10/03/2017 0601   BUN 13 10/03/2017 0601   CREATININE 0.75 10/03/2017 0601   CREATININE 0.97 (H) 07/28/2015 1334   CALCIUM 7.4 (L) 10/03/2017 0601   PROT 4.4 (L) 10/03/2017 0601   ALBUMIN 1.3 (L) 10/03/2017 0601   AST 35 10/03/2017 0601   ALT 15 10/03/2017 0601   ALKPHOS 67 10/03/2017 0601   BILITOT 0.4 10/03/2017 0601   GFRNONAA  >60 10/03/2017 0601   GFRAA >60 10/03/2017 0601   Lipase     Component Value Date/Time   LIPASE 19 10/01/2017 1135       Studies/Results: Dg Chest Port 1 View  Result Date: 10/03/2017 CLINICAL DATA:  Short of breath EXAM: PORTABLE CHEST 1 VIEW COMPARISON:  10/02/2017 FINDINGS: Endotracheal tube 4 cm above the carina. Right arm PICC tip in the SVC. NG tube in the stomach Diffuse bilateral airspace disease unchanged. Small pleural effusions. No pneumothorax IMPRESSION: Support lines remain in good position. Diffuse bilateral airspace disease unchanged from yesterday Electronically Signed   By: Franchot Gallo M.D.   On: 10/03/2017 07:11   Dg Chest Port 1 View  Result Date: 10/02/2017 CLINICAL DATA:  Intubation EXAM: PORTABLE CHEST 1 VIEW COMPARISON:  Portable exam 1109 hours compared to 10/01/2017 FINDINGS: Tip of endotracheal tube projects 3.1 cm above carina. Nasogastric tube extends into stomach. RIGHT arm PICC line tip projects over SVC. Upper normal heart size. Extensive BILATERAL pulmonary airspace infiltrates unchanged. Probable tiny LEFT pleural effusion. No pneumothorax. Bones demineralized. IMPRESSION: Persistent severe diffuse BILATERAL airspace infiltrates. Electronically Signed   By: Lavonia Dana M.D.   On: 10/02/2017 11:26   Dg Chest Port 1 View  Result Date: 10/01/2017 CLINICAL DATA:  Verify endotracheal tube. EXAM: PORTABLE CHEST 1 VIEW COMPARISON:  10/01/2017 FINDINGS: Endotracheal tube is in place with tip approximately 2.9 centimeters above the carina. Nasogastric tube is in place, tip overlying the level of the stomach and side-port in the region the gastroesophageal junction. A RIGHT-sided PICC line tip overlies the superior vena cava. Heart size is normal. There are airspace filling opacities bilaterally with central air bronchograms. There are small bilateral pleural effusions. IMPRESSION: 1. Interval placement of endotracheal tube, tip to be in good position. 2. Persistent  bilateral airspace filling opacities and pleural effusions, similar to prior study. Electronically Signed   By: Nolon Nations M.D.   On: 10/01/2017 13:35   Dg Chest Port 1 View  Result Date: 10/01/2017 CLINICAL DATA:  Respiratory distress.  Follow-up. EXAM: PORTABLE CHEST 1 VIEW COMPARISON:  09/30/2017 FINDINGS: Nasogastric tube enters the abdomen. Right arm PICC tip is in the SVC above the right atrium. Widespread pulmonary opacity has worsened, consistent with progressive edema. Small effusions and dependent atelectasis. IMPRESSION: Progressive/worsened diffuse edema pattern. Electronically Signed   By: Nelson Chimes M.D.   On: 10/01/2017 11:33    Anti-infectives: Anti-infectives (From admission, onward)   Start     Dose/Rate Route Frequency Ordered Stop   10/02/17 1800  meropenem (MERREM) 1 g in sodium chloride 0.9 % 100 mL IVPB     1 g 200 mL/hr over 30 Minutes Intravenous Every 8 hours 10/02/17 1043     09/27/17 1200  meropenem (MERREM) 1 g in sodium chloride 0.9 % 100 mL IVPB  Status:  Discontinued     1 g 200 mL/hr over 30 Minutes Intravenous Every 12 hours 09/27/17 1032 10/02/17 1043   09/27/17 1000  ciprofloxacin (CIPRO) IVPB 400 mg  Status:  Discontinued     400 mg 200 mL/hr over 60 Minutes Intravenous Every 12 hours 09/27/17 0547 09/27/17 1029   09/27/17 0800  metroNIDAZOLE (FLAGYL) IVPB 500 mg  Status:  Discontinued     500 mg 100 mL/hr over 60 Minutes Intravenous Every 8 hours 09/27/17 0547 09/27/17 1030   09/26/17 2315  ciprofloxacin (CIPRO) IVPB 400 mg     400 mg 200 mL/hr over 60 Minutes Intravenous  Once 09/26/17 2304 09/27/17 0032   09/26/17 2315  metroNIDAZOLE (FLAGYL) IVPB 500 mg     500 mg 100 mL/hr over 60 Minutes Intravenous  Once 09/26/17 2304 09/27/17 0229       Assessment/Plan Septic shock secondary to perforated diverticulitis -POD 4, s/p ex lap with Left/sigmoid colectomy with transverse end colostomy, 09-29-17 by Dr. Redmond Pulling -significant contamination,  but ileus seems to be resolving with minimal NGT output and +BS with air in her bag -if she is able to be extubated today, then plan for NGT removal and probably clears if she is not felt to warrant and swallow eval.  If unable to extubated, could likely start trickle TFs down NGT.  SVT, s/p cardioversion The patient required cardioversion 5/8 for her SVT. She is on amiodarone and lopressor currently to control this. Cardiology following.  AKI -resolved.  Cr 0.75.  UOP 2500cc.  Hypotension  Improved    Respiratory Failure, VDRF/Tobacco abuse On vent, ?wean to extubated today per RN.  Defer to CCM  CVA HTN hypoerlipidemia DM Chronic back pain  FEN - NPO/NGT, if ext could DC NGT and give clears, if remains on vent could trickle TFs VTE - SCDs/heparin ID - Merrem 5/7 -->     LOS: 6 days    Claiborne Billings  Darol Destine , [facility] Surgery 10/03/2017, 8:32 AM Pager: [phone]

## 2017-10-03 NOTE — Progress Notes (Signed)
[facility] Progress Note Patient Name: Barbara [name] DOB: [birth year] MRN: [id number]   Date of Service  10/03/2017  HPI/Events of Note  K+ = 3.0 and Creatinine = 0.77.  eICU Interventions  Will replace K+.     Intervention Category Major Interventions: Electrolyte abnormality - evaluation and management  Tehya Leath [place] 10/03/2017, 1:52 AM

## 2017-10-03 NOTE — Progress Notes (Signed)
Dr. Oletta Darter notified of K+=3.0. Orders placed. Will continue to monitor.

## 2017-10-03 NOTE — Progress Notes (Signed)
[facility] CONSULT NOTE   Pharmacy Consult for TPN Indication: Prolonged Ileus  Patient Measurements: Height: 5\' 3"  (160 cm) Weight: 157 lb 3 oz (71.3 kg) IBW/kg (Calculated) : 52.4 TPN AdjBW (KG): 64.8 Body mass index is 27.84 kg/m.  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 Significant contamination was noted with high suspicion for post-op ileus. Pharmacy was consulted for TPN.   Patient unable to provide history currently. Daughter reports patient has taken in little over the past 2 weeks due to pain and nausea. She states patient was watching weight and meticulous about meal intake.   GI: Concern for post-op ileus. No further surgeries planned at this time. NGT output 300 mL. LBM 5/10. Prealb <5 Endo: DM on Lantus 20 qd + SSI at home. CBGs currently 139-305 but improved since TPN started on SSI + 20 units of regular insulin in TPN. Insulin requirements in the past 24 hours: 32 units/24hrs.  Lytes:  K 3.7, Mg 1.9, Phos 2.4, all others wnl. CoCa 9.6. Renal: SCr 0.77 (stable), BUN 11, UOP 1.5 mL/kg/hr. [facility]5-[facility]/2NS at [facility]. Pulm: on vent Cards: BP normal, HR 70-80s on Amiodarone at 30 mg/hr. Metoprolol 2.5mg  IV q6h. Hepatobil: LFTs/Tbili/TG wnl Neuro: RASS 0 ID: Merrem for perforated intra-abdominal infection. WBC down 12.9, Tm 100.3  TPN Access: PICC 09/28/16 TPN start date: 10/01/17 Nutritional Goals (per RD recommendations 10/01/17): KCal: 1522  Protein: 102-115 Fluid: per MD  Goal TPN rate is 70 ml/hr (provides 100% of needs)  Current Nutrition:  NPO TPN  Plan:  Increase TPN to 50 mL/hr - increase rate conservatively until lytes and CBGs controlled.  Hold 20% lipid emulsion for first 7 days for ICU patients per ASPEN guidelines (Start date 5/17) This TPN provides 73 g of protein, 240 g of  dextrose, and 0 g of lipids which provides 1108 kCals per day, meeting 72% of patient needs Electrolytes in TPN: Continue Phos 25 mmol/L, others standard for now Add MVI and trace elements Add 30 units of regular insulin to TPN Change to moderate SSI  q4h and adjust as needed Monitor TPN labs, recheck in AM  KPhos 10 mmol x1 today. KCl 10 meq q1h x4 per MD Consider adding basal insulin   Renold Genta, PharmD, BCPS Clinical Pharmacist Clinical phone for 10/03/2017 until 3p is [phone] After 3p, please call Main Rx at [phone] for assistance 10/03/2017 7:17 AM

## 2017-10-03 NOTE — Progress Notes (Signed)
PT Cancellation Note  Patient Details Name: Barbara [name] MRN: [id number] DOB: 04/19/1941   Cancelled Treatment:    Reason Eval/Treat Not Completed: (P) Medical issues which prohibited therapy Pt placed on full ventilator support and RN request therapy for tomorrow. PT will follow back tomorrow to check on appropriateness of pt.   Yancey Pedley B. Migdalia Dk PT, DPT Acute Rehabilitation  [phone] Pager [phone]     [place] 10/03/2017, 2:23 PM

## 2017-10-03 NOTE — Progress Notes (Signed)
PULMONARY / CRITICAL CARE MEDICINE   Name: Barbara [name] MRN: [id number] DOB: Feb 24, 1941    ADMISSION DATE:  09/26/2017 CONSULTATION DATE:  rapid wide-complex tachycardia  REFERRING MD:  Redmond Pulling  CHIEF COMPLAINT:  Sepsis and septic shock  BRIEF 77 year old female with prior history of perforated sigmoid diverticulitis.  Was actually discharged on 5/6 on antibiotics and analgesia.  Over the following 24 hours developed worsening abdominal pain, nausea and vomiting.  Repeat abdomen CT demonstrated larger abdominal abscess with cavitation.  She had rising white blood cell count as well as hypotension which initially was responsive to fluid.  She also has a history of paroxysmal atrial fibrillation but not on anticoagulation due to prior GI bleeding.  She is maintained on flecainide.  The a.m. 5/9 demonstrated complex tachycardia with a rate in the 200s requiring synchronized cardioversion.  She is now on amiodarone infusion, over the morning she has had intermittent hypotension and because of her severe sepsis critical care has been asked to consult    EVENTS Right upper extremity PICC line 09/26/17 - at [facility] abdomen 5/7: Acute perforated diverticulitis. Pericolonic abscess extending between the bladder and uterus has increased in size and surrounding inflammation. Contrast and debris extends into the abscess cavity.  Urine culture 5/6 was negative  5/7 meropenem>>> Had been on antibiotics since 5/3 prior.   5/10./19 - Postop, hemodynamically stable.  Renal function improved.  Has developed some mild delirium since surgery also mild increase in oxygen need otherwise doing well  10/01/17 - rising wbc. Low Phos and getting repleted. CCS starting TPN. Overnight and throught his AM per rN - worsening delirium (yesterday only memory issues), lethargic, more labored respiratoion continues to be on 6L [place] like yesterday but pulse ox 89-91% per RN. Febrile to 100.27F . Dauighter wondring if  intubation would help patient and feels patient is post op pain. cXR worse. On amio gtt - seen by cards this AM  SUBJECTIVE/OVERNIGHT/INTERVAL HX No events overnight, no new complaints  VITAL SIGNS: BP (!) 145/50   Pulse 81   Temp 99.1 F (37.3 C) (Oral)   Resp (!) 33   Ht 5\' 3"  (1.6 m)   Wt 157 lb 3 oz (71.3 kg)   SpO2 96%   BMI 27.84 kg/m   Liters nasal cannula HEMODYNAMICS:    VENTILATOR SETTINGS: Vent Mode: PSV;CPAP FiO2 (%):  [40 %-50 %] 40 % Set Rate:  [16 bmp] 16 bmp Vt Set:  [420 mL-440 mL] 440 mL PEEP:  [5 cmH20] 5 cmH20 Pressure Support:  [10 cmH20] 10 cmH20  INTAKE / OUTPUT: I/O last 3 completed shifts: In: 2970.3 [I.V.:2320.3; IV Piggyback:650] Out: 7517 [Urine:3060; Emesis/NG output:600]  PHYSICAL EXAMINATION: General: Acutely ill appearing female Neuro: Sedate but withdraws all ext to command HEENT: [place]/AT, PERRL, EOM-I and MMM Heart: RRR, Nl S1/S2 and -M/R/G Lung: CTA bilaterally Abdomen: Soft, diffusely tender, ND and +BS Ext: -edema and -tenderness Skin: Intact   PULMONARY Recent Labs  Lab 09/29/17 1448 09/29/17 1644 09/29/17 1729 09/29/17 1804 10/01/17 1403  PHART 7.284* 7.257* 7.334* 7.363 7.361  PCO2ART 28.6* 38.6 38.8 39.8 55.5*  PO2ART 73.0* 243.0* 112.0* 112.0* 98.0  HCO3 13.5* 17.2* 20.7 22.7 31.0*  TCO2 14* 18* 22 24 33*  O2SAT 93.0 100.0 98.0 98.0 97.0    CBC Recent Labs  Lab 10/01/17 0402 10/02/17 0425 10/03/17 0601  HGB 10.2* 10.3* 9.4*  HCT 31.3* 30.7* 28.2*  WBC 18.7* 13.7* 12.9*  PLT 325 285 284  COAGULATION Recent Labs  Lab 09/27/17 0658  INR 1.03    CARDIAC   Recent Labs  Lab 09/26/17 2207 09/28/17 0848 09/28/17 1419 10/01/17 1135 10/02/17 0425  TROPONINI <0.03 0.03* 0.36* 0.35* 0.25*   No results for input(s): PROBNP in the last 168 hours.   CHEMISTRY Recent Labs  Lab 09/29/17 4403  09/30/17 1157 10/01/17 0402  10/01/17 1917 10/02/17 0032 10/02/17 0425 10/03/17 0045 10/03/17 0601   NA  --    < >  --  144   < > 139 144 145 140 141  K  --    < >  --  4.0   < > 3.6 3.6 3.5 3.0* 3.7  CL  --    < >  --  109   < > 102 106 107 98* 100*  CO2  --    < >  --  29   < > 31 31 31  35* 34*  GLUCOSE  --    < >  --  180*   < > 243* 216* 154* 221* 140*  BUN  --    < >  --  13   < > 11 11 11 11 13   CREATININE  --    < >  --  0.88   < > 0.80 0.77 0.76 0.77 0.75  CALCIUM  --    < >  --  7.7*   < > 7.3* 7.6* 7.6* 7.2* 7.4*  MG 2.6*  --  2.1 2.2  --   --   --  1.8  --  1.9  PHOS  --   --  1.4* 1.7*  --   --   --  1.4*  --  2.4*   < > = values in this interval not displayed.   Estimated Creatinine Clearance: 58.4 mL/min (by C-G formula based on SCr of 0.75 mg/dL).  LIVER Recent Labs  Lab 09/26/17 2207 09/27/17 0609 09/27/17 0658 10/01/17 1135 10/02/17 0425 10/03/17 0601  AST 27 24  --  19 15 35  ALT 19 16  --  14 12* 15  ALKPHOS 100 87  --  69 65 67  BILITOT 0.7 0.7  --  0.3 0.5 0.4  PROT 6.9 5.9*  --  4.5* 4.3* 4.4*  ALBUMIN 3.5 2.9*  --  1.7* 1.5* 1.3*  INR  --   --  1.03  --   --   --      INFECTIOUS Recent Labs  Lab 09/27/17 0029 09/29/17 1300 10/01/17 1135 10/02/17 0425  LATICACIDVEN 1.0 1.5 1.4  --   PROCALCITON  --   --  2.72 1.91   ENDOCRINE CBG (last 3)  Recent Labs    10/03/17 0356 10/03/17 0757 10/03/17 1123  GLUCAP 139* 113* 123*   IMAGING x48h  - image(s) personally visualized  -   highlighted in bold Dg Chest Port 1 View  Result Date: 10/03/2017 CLINICAL DATA:  Short of breath EXAM: PORTABLE CHEST 1 VIEW COMPARISON:  10/02/2017 FINDINGS: Endotracheal tube 4 cm above the carina. Right arm PICC tip in the SVC. NG tube in the stomach Diffuse bilateral airspace disease unchanged. Small pleural effusions. No pneumothorax IMPRESSION: Support lines remain in good position. Diffuse bilateral airspace disease unchanged from yesterday Electronically Signed   By: Franchot Gallo M.D.   On: 10/03/2017 07:11   Dg Chest Port 1 View  Result Date:  10/02/2017 CLINICAL DATA:  Intubation EXAM: PORTABLE CHEST 1 VIEW COMPARISON:  Portable exam  1109 hours compared to 10/01/2017 FINDINGS: Tip of endotracheal tube projects 3.1 cm above carina. Nasogastric tube extends into stomach. RIGHT arm PICC line tip projects over SVC. Upper normal heart size. Extensive BILATERAL pulmonary airspace infiltrates unchanged. Probable tiny LEFT pleural effusion. No pneumothorax. Bones demineralized. IMPRESSION: Persistent severe diffuse BILATERAL airspace infiltrates. Electronically Signed   By: Lavonia Dana M.D.   On: 10/02/2017 11:26   Dg Chest Port 1 View  Result Date: 10/01/2017 CLINICAL DATA:  Verify endotracheal tube. EXAM: PORTABLE CHEST 1 VIEW COMPARISON:  10/01/2017 FINDINGS: Endotracheal tube is in place with tip approximately 2.9 centimeters above the carina. Nasogastric tube is in place, tip overlying the level of the stomach and side-port in the region the gastroesophageal junction. A RIGHT-sided PICC line tip overlies the superior vena cava. Heart size is normal. There are airspace filling opacities bilaterally with central air bronchograms. There are small bilateral pleural effusions. IMPRESSION: 1. Interval placement of endotracheal tube, tip to be in good position. 2. Persistent bilateral airspace filling opacities and pleural effusions, similar to prior study. Electronically Signed   By: Nolon Nations M.D.   On: 10/01/2017 13:35   DISCUSSION: Perforated diverticulitis with abscess, resultant septic shock and mods as well as DKA.  She is now status post exploratory laparotomy with sigmoid colectomy and transverse colectomy.  Hemodynamically stable anion gap is closed from a DKA standpoint renal functions normalized.  She does have some mild delirium and hypoxia today however ultimately seems to be doing well  ASSESSMENT / PLAN:  Severe sepsis, septic shock in the setting of perforated diverticulitis with new abscess, now postoperative day #1 for  exploratory lap with sigmoid colectomy and transverse and colostomy on 5/9. -There is marked abdominal contamination per surgical note  Plan D/C TPN Begin trickle feeds Appreciate input from surgery and wound care  RESP  A Acute post op respiratory failure  Plan Place back to full support, RSBI is too high Another dose of lasix today KVO IVF  CVS WCT w/ h/o PAF w/ h/o CAD->required cardioversion X 1 EF 65-70%; No new WM abnormality   10/01/17 - holding BP/HR, Onb Amio gtt to continue per cards 09/30/17 and to transiiton to po when can use her gut 10/02/17 - still on amio gtt  Plan Continue telemetry monitoring Will d/c amiodarone given lung toxicity concerns, if HR increases then will restart Continue beta blockers however  ENDOCRINE Diabetic ketoacidosis on transer to [facility]. Anion gap is closed and bic improved > 20 on 09/30/17  Plan CBG ISS  RENAL Recent Labs  Lab 10/01/17 1917 10/02/17 0032 10/02/17 0425 10/03/17 0045 10/03/17 0601  CREATININE 0.80 0.77 0.76 0.77 0.75   AKI in the setting of sepsis.  Resolved 10/01/17  PLAN KVO IVF BMET in AM Lasix 40 mg IV q8 x2 doses Replace electrolytes as indicated  CNS A: delirium metabolic and post op pain, much improved, appropriate and following commands  P Minimize sedation Pain control as ordered  Anemia of critical illness -No evidence of postoperative bleeding Plan CBC in AM Transfuse per ICU protocol  FAMILY  - Family updated bed side, will wait til tomorrow for another trials for PS  The patient is critically ill with multiple organ systems failure and requires high complexity decision making for assessment and support, frequent evaluation and titration of therapies, application of advanced monitoring technologies and extensive interpretation of multiple databases.   Critical Care Time devoted to patient care services described in this note is  15  Minutes. This time reflects time of care of this  signee Dr Jennet Maduro. This critical care time does not reflect procedure time, or teaching time or supervisory time of PA/NP/Med student/Med Resident etc but could involve care discussion time.  Rush Farmer, M.D. [facility] Pulmonary/Critical Care Medicine. Pager: [phone]. After hours pager: [phone].  10/03/2017 11:45 AM

## 2017-10-04 ENCOUNTER — Inpatient Hospital Stay (HOSPITAL_COMMUNITY): Payer: Medicare Other

## 2017-10-04 ENCOUNTER — Ambulatory Visit: Payer: Medicare Other | Admitting: General Surgery

## 2017-10-04 LAB — CBC WITH DIFFERENTIAL/PLATELET
BASOS ABS: 0 10*3/uL (ref 0.0–0.1)
Basophils Relative: 0 %
Eosinophils Absolute: 0.3 10*3/uL (ref 0.0–0.7)
Eosinophils Relative: 3 %
HCT: 28.3 % — ABNORMAL LOW (ref 36.0–46.0)
HEMOGLOBIN: 9.3 g/dL — AB (ref 12.0–15.0)
Lymphocytes Relative: 12 %
Lymphs Abs: 1.3 10*3/uL (ref 0.7–4.0)
MCH: 30 pg (ref 26.0–34.0)
MCHC: 32.9 g/dL (ref 30.0–36.0)
MCV: 91.3 fL (ref 78.0–100.0)
Monocytes Absolute: 1.7 10*3/uL — ABNORMAL HIGH (ref 0.1–1.0)
Monocytes Relative: 16 %
NEUTROS PCT: 69 %
Neutro Abs: 7.5 10*3/uL (ref 1.7–7.7)
PLATELETS: 260 10*3/uL (ref 150–400)
RBC: 3.1 MIL/uL — AB (ref 3.87–5.11)
RDW: 14.7 % (ref 11.5–15.5)
WBC: 10.8 10*3/uL — AB (ref 4.0–10.5)

## 2017-10-04 LAB — GLUCOSE, CAPILLARY
GLUCOSE-CAPILLARY: 220 mg/dL — AB (ref 65–99)
GLUCOSE-CAPILLARY: 89 mg/dL (ref 65–99)
Glucose-Capillary: 192 mg/dL — ABNORMAL HIGH (ref 65–99)
Glucose-Capillary: 190 mg/dL — ABNORMAL HIGH (ref 65–99)
Glucose-Capillary: 209 mg/dL — ABNORMAL HIGH (ref 65–99)
Glucose-Capillary: 152 mg/dL — ABNORMAL HIGH (ref 65–99)

## 2017-10-04 LAB — PHOSPHORUS: PHOSPHORUS: 2.9 mg/dL (ref 2.5–4.6)

## 2017-10-04 LAB — COMPREHENSIVE METABOLIC PANEL
ALK PHOS: 75 U/L (ref 38–126)
ALT: 26 U/L (ref 14–54)
AST: 61 U/L — ABNORMAL HIGH (ref 15–41)
Albumin: 1.3 g/dL — ABNORMAL LOW (ref 3.5–5.0)
Anion gap: 9 (ref 5–15)
BILIRUBIN TOTAL: 0.3 mg/dL (ref 0.3–1.2)
BUN: 16 mg/dL (ref 6–20)
CO2: 37 mmol/L — ABNORMAL HIGH (ref 22–32)
CREATININE: 0.81 mg/dL (ref 0.44–1.00)
Calcium: 7.5 mg/dL — ABNORMAL LOW (ref 8.9–10.3)
Chloride: 93 mmol/L — ABNORMAL LOW (ref 101–111)
GFR calc Af Amer: >60 mL/min (ref >60)
GLUCOSE: 208 mg/dL — AB (ref 65–99)
Potassium: 3.1 mmol/L — ABNORMAL LOW (ref 3.5–5.1)
Sodium: 139 mmol/L (ref 135–145)
TOTAL PROTEIN: 4.4 g/dL — AB (ref 6.5–8.1)

## 2017-10-04 LAB — BLOOD GAS, ARTERIAL
ACID-BASE EXCESS: 14.9 mmol/L — AB (ref 0.0–2.0)
Bicarbonate: 38.9 mmol/L — ABNORMAL HIGH (ref 20.0–28.0)
Drawn by: 347621
FIO2: 40
O2 SAT: 95.2 %
PATIENT TEMPERATURE: 98.6
PCO2 ART: 45.6 mmHg (ref 32.0–48.0)
PEEP/CPAP: 5 cmH2O
PH ART: 7.54 — AB (ref 7.350–7.450)
RATE: 16 resp/min
VT: 420 mL
pO2, Arterial: 69.4 mmHg — ABNORMAL LOW (ref 83.0–108.0)

## 2017-10-04 LAB — MAGNESIUM: MAGNESIUM: 1.8 mg/dL (ref 1.7–2.4)

## 2017-10-04 MED ORDER — FUROSEMIDE 10 MG/ML IJ SOLN
40.0000 mg | Freq: Three times a day (TID) | INTRAMUSCULAR | Status: AC
  Administered 2017-10-04 (×2): 40 mg via INTRAVENOUS
  Filled 2017-10-04 (×2): qty 4

## 2017-10-04 MED ORDER — VITAL AF 1.2 CAL PO LIQD
1000.0000 mL | ORAL | Status: DC
Start: 2017-10-04 — End: 2017-10-04

## 2017-10-04 MED ORDER — VITAL HIGH PROTEIN PO LIQD: 1000.0000 mL | ORAL | Status: DC

## 2017-10-04 MED ORDER — POTASSIUM CHLORIDE 10 MEQ/50ML IV SOLN
10.0000 meq | INTRAVENOUS | Status: DC
  Administered 2017-10-04 (×3): 10 meq via INTRAVENOUS
  Filled 2017-10-04 (×3): qty 50

## 2017-10-04 MED ORDER — CHLORHEXIDINE GLUCONATE 0.12 % MT SOLN
15.0000 mL | Freq: Two times a day (BID) | OROMUCOSAL | Status: DC
  Administered 2017-10-04 – 2017-10-10 (×12): 15 mL via OROMUCOSAL
  Filled 2017-10-04 (×10): qty 15

## 2017-10-04 MED ORDER — POTASSIUM PHOSPHATES 15 MMOLE/5ML IV SOLN
30.0000 mmol | Freq: Once | INTRAVENOUS | Status: AC
  Administered 2017-10-04: 30 mmol via INTRAVENOUS
  Filled 2017-10-04: qty 10

## 2017-10-04 MED ORDER — ORAL CARE MOUTH RINSE
15.0000 mL | Freq: Two times a day (BID) | OROMUCOSAL | Status: DC
  Administered 2017-10-05 (×2): 15 mL via OROMUCOSAL

## 2017-10-04 MED ORDER — MAGNESIUM SULFATE 2 GM/50ML IV SOLN
2.0000 g | Freq: Once | INTRAVENOUS | Status: AC
  Administered 2017-10-04: 2 g via INTRAVENOUS
  Filled 2017-10-04: qty 50

## 2017-10-04 MED ORDER — INSULIN GLARGINE 100 UNIT/ML ~~LOC~~ SOLN
10.0000 [IU] | Freq: Every day | SUBCUTANEOUS | Status: DC
Start: 2017-10-04 — End: 2017-10-06
  Administered 2017-10-04 – 2017-10-05 (×2): 10 [IU] via SUBCUTANEOUS
  Filled 2017-10-04 (×2): qty 0.1

## 2017-10-04 MED ORDER — FENTANYL CITRATE (PF) 100 MCG/2ML IJ SOLN
25.0000 ug | INTRAMUSCULAR | Status: DC | PRN
  Administered 2017-10-04 – 2017-10-06 (×17): 50 ug via INTRAVENOUS
  Filled 2017-10-04 (×17): qty 2

## 2017-10-04 NOTE — Consult Note (Addendum)
[place] Nurse wound consult note Reason for Consult: Surgical team following for assessment and plan of care.  Requested to apply Vac to abd wound. Wound type: Full thickness post-op wound to midline abd Measurement: 14X4X4cm Wound bed: moist red with patchy areas of yellow adipose interspersed throughout Drainage (amount, consistency, odor) scant amt yellow drainage, no odor Periwound: intact skin surrounding Dressing procedure/placement/frequency: Applied one piece black foam to 184mm cont suction.  Pt medicated for pain prior to the procedure and tolerated without apparent discomfort.  WOC will plan to change again on FRI, then begin a M/W/F change schedule.  Ostomy pouch is in close proximity and will need to be changed with each Vac dressing.  It is currently intact with good seal and scant amt brown unformed stool. Family at the bedside to assess wound appearance and discuss plan of care during Vac application. Julien Girt MSN, RN, [place], [place], [place]

## 2017-10-04 NOTE — Evaluation (Signed)
Clinical/Bedside Swallow Evaluation Patient Details  Name: Barbara [name] MRN: [id number] Date of Birth: 1941/01/02  Today's Date: 10/04/2017 Time: SLP Start Time (ACUTE ONLY): 1520 SLP Stop Time (ACUTE ONLY): 1530 SLP Time Calculation (min) (ACUTE ONLY): 10 min  Past Medical History:  Past Medical History:  Diagnosis Date  . Arteriosclerotic cardiovascular disease (ASCVD)    non-obstructive; negative stress nuclear and normal echo in 08/2009 [facility] in 03/2000-20%  main 20% LAD, 30% circumflex, 30% RCA, hyperdynamic LV  . Asthma    Mild  . Chest pain   . Chronic back pain   . Diabetes mellitus    A1c of 7.6 in 04/2010; moderate to high dose insulin  . GERD (gastroesophageal reflux disease)   . Hyperlipidemia    total cholesterol of 271 and LDL of 187 in 07/2010  . Hypertension   . Macular degeneration   . Palpitations   . Stroke [facility]) 1993 2008   short term memory loss  . Tobacco abuse    Past Surgical History:  Past Surgical History:  Procedure Laterality Date  . APPENDECTOMY    . CATARACT EXTRACTION     left  . COLECTOMY WITH COLOSTOMY CREATION/HARTMANN PROCEDURE Left 09/29/2017   Procedure: EXTENDED LEFT HEMI COLECTOMY WITH COLOSTOMY CREATION (HARTMANN PROCEDURE);  Surgeon: Greer Pickerel, MD;  Location: [place];  Service: General;  Laterality: Left;  . COLONOSCOPY  2009  . COLONOSCOPY  03/30/2012   Procedure: COLONOSCOPY;  Surgeon: Rogene Houston, MD;  [facility];  Service: Endoscopy;  Laterality: N/A;  730  . COLONOSCOPY N/A 12/13/2013   Procedure: COLONOSCOPY;  Surgeon: Rogene Houston, MD;  [facility];  Service: Endoscopy;  Laterality: N/A;  1200  . ESOPHAGOGASTRODUODENOSCOPY  10/21/2011   Procedure: ESOPHAGOGASTRODUODENOSCOPY (EGD);  Surgeon: Rogene Houston, MD;  [facility];  Service: Endoscopy;  Laterality: N/A;  1200  . ESOPHAGOGASTRODUODENOSCOPY N/A 12/13/2013   Procedure: ESOPHAGOGASTRODUODENOSCOPY  (EGD);  Surgeon: Rogene Houston, MD;  [facility];  Service: Endoscopy;  Laterality: N/A;  . LAPAROTOMY N/A 09/29/2017   Procedure: EXPLORATORY LAPAROTOMY;  Surgeon: Greer Pickerel, MD;  Location: [place];  Service: General;  Laterality: N/A;  . TUBAL LIGATION     HPI:  Pt presents with perforated diverticulitis with abscess, resultant septic shock and mods as well as DKA.  She is now status post exploratory laparotomy with sigmoid colectomy and transverse colectomy. Intubated from 5/11 to 5/14.    Assessment / Plan / Recommendation Clinical Impression  Pt demonstrates normal swallow function, no signs of aspiration, vocal quality clear even after extubation today. No SLP f/u needed, may continue liquid diet now and advance per MD.  SLP Visit Diagnosis: Dysphagia, unspecified (R13.10)    Aspiration Risk  Mild aspiration risk    Diet Recommendation Regular;Thin liquid   Liquid Administration via: Cup;Straw Medication Administration: Whole meds with liquid Supervision: Patient able to self feed Postural Changes: Seated upright at 90 degrees    Other  Recommendations     Follow up Recommendations None      Frequency and Duration            Prognosis        Swallow Study   General HPI: Pt presents with perforated diverticulitis with abscess, resultant septic shock and mods as well as DKA.  She is now status post exploratory laparotomy with sigmoid colectomy and transverse colectomy. Intubated from 5/11 to 5/14.  Type of Study: Bedside  Swallow Evaluation Previous Swallow Assessment: none Diet Prior to this Study: Thin liquids Temperature Spikes Noted: No Respiratory Status: Nasal cannula History of Recent Intubation: Yes Length of Intubations (days): 3 days Date extubated: 10/04/17 Behavior/Cognition: Alert;Cooperative;Pleasant mood Oral Cavity Assessment: Within Functional Limits Oral Care Completed by SLP: No Oral Cavity - Dentition: Adequate natural  dentition Vision: Functional for self-feeding Self-Feeding Abilities: Able to feed self Patient Positioning: Upright in bed Baseline Vocal Quality: Normal Volitional Cough: Congested Volitional Swallow: Able to elicit    Oral/Motor/Sensory Function Overall Oral Motor/Sensory Function: Within functional limits   Ice Chips Ice chips: Within functional limits   Thin Liquid Thin Liquid: Within functional limits Presentation: Cup;Straw;Self Fed    Nectar Thick Nectar Thick Liquid: Not tested   Honey Thick Honey Thick Liquid: Not tested   Puree Puree: Within functional limits Presentation: Spoon   Solid   GO   Solid: Not tested       Herbie Baltimore, MA CCC-SLP [phone]  Lynann Beaver 10/04/2017,3:34 PM

## 2017-10-04 NOTE — Progress Notes (Signed)
Physical Therapy Treatment Patient Details Name: Barbara [name] MRN: [id number] DOB: 19-May-1941 Today's Date: 10/04/2017    History of Present Illness 77 year old female with prior history of perforated sigmoid diverticulitis.  Was actually discharged on 5/6 on antibiotics and analgesia.  Over the following 24 hours developed worsening abdominal pain, nausea and vomiting.  Repeat abdomen CT demonstrated larger abdominal abscess with cavitation.  She had rising white blood cell count as well as hypotension which initially was responsive to fluid.  She also has a history of paroxysmal atrial fibrillation but not on anticoagulation due to prior GI bleeding.  She is maintained on flecainide.  The a.m. 5/9 demonstrated complex tachycardia with a rate in the 200s requiring synchronized cardioversion.  She is now on amiodarone infusion, over the morning she has had intermittent hypotension and because of her severe sepsis critical care has been asked to consult. 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  Intubated 5/11-5/14    PT Comments    Pt extubated today and willing to sit on side of bed with therapy today. Pt requires maxAx2 to come to seated EoB and was able to sit with min guard assist for 3 minutes before requesting to return to bed secondary to increase in abdominal pain. PT will continue to follow acutely to progress mobility.     Follow Up Recommendations  CIR;Supervision/Assistance - 24 hour     Equipment Recommendations  Rolling walker with 5" wheels;3in1 (PT)    Recommendations for Other Services       Precautions / Restrictions Precautions Precautions: Fall Restrictions Weight Bearing Restrictions: No    Mobility  Bed Mobility Overal bed mobility: Needs Assistance Bed Mobility: Supine to Sit     Supine to sit: Max assist;+2 for physical assistance     General bed mobility comments: Helicopter method to come to EOB with use of pad  Transfers                    Ambulation/Gait             General Gait Details: unable         Balance Overall balance assessment: Needs assistance Sitting-balance support: No upper extremity supported;Feet supported Sitting balance-Leahy Scale: Poor Sitting balance - Comments: needed bil UE support or external support to sit EOB.  Postural control: Posterior lean Standing balance support: Bilateral upper extremity supported;During functional activity Standing balance-Leahy Scale: Poor Standing balance comment: relies on bil UE support or external support.  Needed mod assist to stand of 2 persons.                             Cognition Arousal/Alertness: Lethargic;Suspect due to medications Behavior During Therapy: Flat affect;Anxious Overall Cognitive Status: Within Functional Limits for tasks assessed                                        Exercises Other Exercises Other Exercises: Pt sat EoB for 3 minutes and requested back to bed secondary to increase in abdominal pain    General Comments General comments (skin integrity, edema, etc.): Surgical PA okayed mobility with abdominal wound vac. at rest BP 146/57 HR 85 bpm, SaO2 on 4L O2 via [place] 96% O2, with sitting EoB BP 140/63, max HR 116 bpm, SaO2 on 4L O2 92%O2      Pertinent Vitals/Pain Pain  Assessment: 0-10 Faces Pain Scale: Hurts whole lot Pain Location: abdomen Pain Descriptors / Indicators: Aching;Grimacing;Guarding;Operative site guarding Pain Intervention(s): Limited activity within patient's tolerance;Repositioned;Premedicated before session           PT Goals (current goals can now be found in the care plan section) Acute Rehab PT Goals Patient Stated Goal: to get better PT Goal Formulation: With patient Time For Goal Achievement: 10/14/17 Potential to Achieve Goals: Good Progress towards PT goals: Not progressing toward goals - comment(pt has had set back since Evaluation )    Frequency     Min 3X/week      PT Plan Current plan remains appropriate       AM-PAC PT "6 Clicks" Daily Activity  Outcome Measure  Difficulty turning over in bed (including adjusting bedclothes, sheets and blankets)?: Unable Difficulty moving from lying on back to sitting on the side of the bed? : Unable Difficulty sitting down on and standing up from a chair with arms (e.g., wheelchair, bedside commode, etc,.)?: Unable Help needed moving to and from a bed to chair (including a wheelchair)?: Total Help needed walking in hospital room?: Total Help needed climbing 3-5 steps with a railing? : Total 6 Click Score: 6    End of Session Equipment Utilized During Treatment: Gait belt;Oxygen(6L) Activity Tolerance: Patient limited by fatigue;Patient limited by pain Patient left: in chair;with call bell/phone within reach;with family/visitor present;with chair alarm set Nurse Communication: Mobility status;Need for lift equipment PT Visit Diagnosis: Unsteadiness on feet (R26.81);Muscle weakness (generalized) (M62.81);Pain Pain - part of body: (abdomen)     Time: 2774-1287 PT Time Calculation (min) (ACUTE ONLY): 24 min  Charges:  $Therapeutic Activity: 23-37 mins                    G Codes:       Sael Furches B. Migdalia Dk PT, DPT Acute Rehabilitation  [phone] Pager [phone]     [place] 10/04/2017, 3:30 PM

## 2017-10-04 NOTE — Progress Notes (Addendum)
Nutrition Follow-up / Consult  DOCUMENTATION CODES:   Not applicable  INTERVENTION:   Start trickle tube feedings:  Vital AF 1.2 at 20 ml/h (480 ml per day)  Provides 576 kcal, 36 gm protein, 389 ml free water daily  If need to continue TF, goal rate for Vital AF 1.2 is 60 ml/h (1440 ml per day) to provide 1728 kcal, 108 gm protein, 1168 ml free water daily.  NUTRITION DIAGNOSIS:   Inadequate oral intake related to inability to eat as evidenced by NPO status.  Ongoing  GOAL:   Patient will meet greater than or equal to 90% of their needs  Unmet  MONITOR:   TF tolerance, Diet advancement, PO intake, I & O's, Skin  REASON FOR ASSESSMENT:   Consult Enteral/tube feeding initiation and management  ASSESSMENT:   77 yo female with PMH of DM, HTN, she was recently admitted to the hospital with sigmoid radiculitis and abscess, discharged on abx 5/5. Returned 5/6 with worsening abdominal pain along with nausea, vomiting, and diarrhea. Found with perforated diverticulitis with abscess, resultant septic shock and DKA. S/P exp lap with sigmoid colectomy and transverse colectomy.  Discussed patient with CCM today. Extubated this morning. NGT remains in place. Plans for swallow evaluation. If patient passes, she will be able to begin clear liquids PO.  Will start trickle TF today. If diet advanced, plans to remove NGT and d/c TF.  Labs reviewed. Potassium 3.1 (L) CBG's: 192-190 Medications reviewed and include Lantus, sliding scale Novolog, magnesium sulfate once today IV, potassium sulfate once today IV.  TPN being discontinued today.   Diet Order:   Diet Order    None      EDUCATION NEEDS:   Not appropriate for education at this time  Skin:  Skin Assessment: Reviewed RN Assessment(closed incision to abdomen)  Last BM:  5/10 (colostomy placed 5/9)  Height:   Ht Readings from Last 1 Encounters:  09/27/17 5\' 3"  (1.6 m)    Weight:   Wt Readings from Last 1  Encounters:  10/03/17 157 lb 3 oz (71.3 kg)   09/29/17 150 lb 12.7 oz (68.4 kg)    Ideal Body Weight:  52.27 kg  BMI:  Body mass index is 27.84 kg/m.  Estimated Nutritional Needs:   Kcal:  1600-1800  Protein:  90-100 gm  Fluid:  1.6-1.8 L    Molli Barrows, RD, LDN, [place] Pager [phone] After Hours Pager [phone]

## 2017-10-04 NOTE — Progress Notes (Signed)
PULMONARY / CRITICAL CARE MEDICINE   Name: Barbara [name] MRN: [id number] DOB: March 15, 1941    ADMISSION DATE:  09/26/2017 CONSULTATION DATE:  rapid wide-complex tachycardia  REFERRING MD:  Redmond Pulling  CHIEF COMPLAINT:  Sepsis and septic shock  BRIEF 77 year old female with prior history of perforated sigmoid diverticulitis.  Was actually discharged on 5/6 on antibiotics and analgesia.  Over the following 24 hours developed worsening abdominal pain, nausea and vomiting.  Repeat abdomen CT demonstrated larger abdominal abscess with cavitation.  She had rising white blood cell count as well as hypotension which initially was responsive to fluid.  She also has a history of paroxysmal atrial fibrillation but not on anticoagulation due to prior GI bleeding.  She is maintained on flecainide.  The a.m. 5/9 demonstrated complex tachycardia with a rate in the 200s requiring synchronized cardioversion.  She is now on amiodarone infusion, over the morning she has had intermittent hypotension and because of her severe sepsis critical care has been asked to consult    EVENTS Right upper extremity PICC line 09/26/17 - at [place] abdomen 5/7: Acute perforated diverticulitis. Pericolonic abscess extending between the bladder and uterus has increased in size and surrounding inflammation. Contrast and debris extends into the abscess cavity.  Urine culture 5/6 was negative  5/7 meropenem>>> Had been on antibiotics since 5/3 prior.   5/10./19 - Postop, hemodynamically stable.  Renal function improved.  Has developed some mild delirium since surgery also mild increase in oxygen need otherwise doing well  10/01/17 - rising wbc. Low Phos and getting repleted. CCS starting TPN. Overnight and throught his AM per rN - worsening delirium (yesterday only memory issues), lethargic, more labored respiratoion continues to be on 6L [place] like yesterday but pulse ox 89-91% per RN. Febrile to 100.76F . Dauighter wondring if  intubation would help patient and feels patient is post op pain. cXR worse. On amio gtt - seen by cards this AM  SUBJECTIVE/OVERNIGHT/INTERVAL HX No events overnight, weaning  VITAL SIGNS: BP (!) 141/62   Pulse 85   Temp 99.2 F (37.3 C) (Oral)   Resp (!) 27   Ht 5\' 3"  (1.6 m)   Wt 157 lb 3 oz (71.3 kg)   SpO2 96%   BMI 27.84 kg/m    Intubated  HEMODYNAMICS:    VENTILATOR SETTINGS: Vent Mode: PSV;CPAP FiO2 (%):  [40 %] 40 % Set Rate:  [16 bmp] 16 bmp Vt Set:  [420 mL] 420 mL PEEP:  [5 cmH20] 5 cmH20 Pressure Support:  [5 cmH20-10 cmH20] 5 cmH20 Plateau Pressure:  [14 cmH20-23 cmH20] 18 cmH20  INTAKE / OUTPUT: I/O last 3 completed shifts: In: 2892.4 [I.V.:2052.4; NG/GT:30; IV Piggyback:810] Out: 6834 [Urine:5995; Emesis/NG output:400]  PHYSICAL EXAMINATION: General: Acutely ill appearing female, weaning Neuro: Arousable and following command, moving all ext HEENT: [place]/AT, PERRL, EOM-I and MMM Heart: RRR, NL S1/S2 and -M/R/G Lung: CTA bilaterally Abdomen: Soft, diffusely tender, vac in place and hypoactive bowel sounds Ext: -edema and -tenderness Skin: Intact  PULMONARY Recent Labs  Lab 09/29/17 1448 09/29/17 1644 09/29/17 1729 09/29/17 1804 10/01/17 1403 10/04/17 0345  PHART 7.284* 7.257* 7.334* 7.363 7.361 7.540*  PCO2ART 28.6* 38.6 38.8 39.8 55.5* 45.6  PO2ART 73.0* 243.0* 112.0* 112.0* 98.0 69.4*  HCO3 13.5* 17.2* 20.7 22.7 31.0* 38.9*  TCO2 14* 18* 22 24 33*  --   O2SAT 93.0 100.0 98.0 98.0 97.0 95.2   CBC Recent Labs  Lab 10/02/17 0425 10/03/17 0601 10/04/17 0440  HGB 10.3* 9.4*  9.3*  HCT 30.7* 28.2* 28.3*  WBC 13.7* 12.9* 10.8*  PLT 285 284 260   COAGULATION No results for input(s): INR in the last 168 hours.  CARDIAC   Recent Labs  Lab 09/28/17 0848 09/28/17 1419 10/01/17 1135 10/02/17 0425  TROPONINI 0.03* 0.36* 0.35* 0.25*   No results for input(s): PROBNP in the last 168 hours.  CHEMISTRY Recent Labs  Lab 09/30/17 1157  10/01/17 0402  10/02/17 0032 10/02/17 0425 10/03/17 0045 10/03/17 0601 10/04/17 0545  NA  --  144   < > 144 145 140 141 139  K  --  4.0   < > 3.6 3.5 3.0* 3.7 3.1*  CL  --  109   < > 106 107 98* 100* 93*  CO2  --  29   < > 31 31 35* 34* 37*  GLUCOSE  --  180*   < > 216* 154* 221* 140* 208*  BUN  --  13   < > 11 11 11 13 16   CREATININE  --  0.88   < > 0.77 0.76 0.77 0.75 0.81  CALCIUM  --  7.7*   < > 7.6* 7.6* 7.2* 7.4* 7.5*  MG 2.1 2.2  --   --  1.8  --  1.9 1.8  PHOS 1.4* 1.7*  --   --  1.4*  --  2.4* 2.9   < > = values in this interval not displayed.   Estimated Creatinine Clearance: 57.7 mL/min (by C-G formula based on SCr of 0.81 mg/dL).  LIVER Recent Labs  Lab 10/01/17 1135 10/02/17 0425 10/03/17 0601 10/04/17 0545  AST 19 15 35 61*  ALT 14 12* 15 26  ALKPHOS 69 65 67 75  BILITOT 0.3 0.5 0.4 0.3  PROT 4.5* 4.3* 4.4* 4.4*  ALBUMIN 1.7* 1.5* 1.3* 1.3*     INFECTIOUS Recent Labs  Lab 09/29/17 1300 10/01/17 1135 10/02/17 0425 10/03/17 0601  LATICACIDVEN 1.5 1.4  --   --   PROCALCITON  --  2.72 1.91 0.87   ENDOCRINE CBG (last 3)  Recent Labs    10/03/17 2323 10/04/17 0401 10/04/17 0805  GLUCAP 264* 192* 190*   IMAGING x48h  - image(s) personally visualized  -   highlighted in bold Dg Chest Port 1 View  Result Date: 10/04/2017 CLINICAL DATA:  History of endotracheal tube EXAM: PORTABLE CHEST 1 VIEW COMPARISON:  Yesterday FINDINGS: Endotracheal tube tip at the clavicular heads. An orogastric tube reaches the stomach. Right upper extremity PICC with tip at the upper SVC. Diffuse interstitial coarsening with perihilar indistinct opacity. Haziness at the right base attributed to small pleural effusion. Normal heart size. No pneumothorax. IMPRESSION: 1. Perihilar opacity likely reflecting pneumonia. No definite change from yesterday. 2. Stable hardware positioning. Electronically Signed   By: Monte Fantasia M.D.   On: 10/04/2017 07:50   Dg Chest Port 1  View  Result Date: 10/03/2017 CLINICAL DATA:  Short of breath EXAM: PORTABLE CHEST 1 VIEW COMPARISON:  10/02/2017 FINDINGS: Endotracheal tube 4 cm above the carina. Right arm PICC tip in the SVC. NG tube in the stomach Diffuse bilateral airspace disease unchanged. Small pleural effusions. No pneumothorax IMPRESSION: Support lines remain in good position. Diffuse bilateral airspace disease unchanged from yesterday Electronically Signed   By: Franchot Gallo M.D.   On: 10/03/2017 07:11   Dg Chest Port 1 View  Result Date: 10/02/2017 CLINICAL DATA:  Intubation EXAM: PORTABLE CHEST 1 VIEW COMPARISON:  Portable exam 1109 hours compared to  10/01/2017 FINDINGS: Tip of endotracheal tube projects 3.1 cm above carina. Nasogastric tube extends into stomach. RIGHT arm PICC line tip projects over SVC. Upper normal heart size. Extensive BILATERAL pulmonary airspace infiltrates unchanged. Probable tiny LEFT pleural effusion. No pneumothorax. Bones demineralized. IMPRESSION: Persistent severe diffuse BILATERAL airspace infiltrates. Electronically Signed   By: Lavonia Dana M.D.   On: 10/02/2017 11:26   DISCUSSION: Perforated diverticulitis with abscess, resultant septic shock and mods as well as DKA.  She is now status post exploratory laparotomy with sigmoid colectomy and transverse colectomy.  Hemodynamically stable anion gap is closed from a DKA standpoint renal functions normalized.  She does have some mild delirium and hypoxia today however ultimately seems to be doing well  ASSESSMENT / PLAN:  Severe sepsis, septic shock in the setting of perforated diverticulitis with new abscess, now postoperative day #1 for exploratory lap with sigmoid colectomy and transverse and colostomy on 5/9. -There is marked abdominal contamination per surgical note  Plan D/C TPN SLP post extubation Appreciate input from surgery and wound care, NGT out if passes swallow evaluation  RESP A Acute post op respiratory  failure  Plan Extubate now Titrate O2 for sat of 88-92% IS per RT protocol Flutter valve Lasix as below KVO IVF  CVS WCT w/ h/o PAF w/ h/o CAD->required cardioversion X 1 EF 65-70%; No new WM abnormality   Plan Continue telemetry monitoring Remove amiodarone off MAR Continue beta blockers however  ENDOCRINE Diabetic ketoacidosis on transer to [facility]. Anion gap is closed and bic improved > 20 on 09/30/17  Plan CBG ISS Will need basal insulin, consult diabetic coordinator when able to eat  RENAL Recent Labs  Lab 10/02/17 0032 10/02/17 0425 10/03/17 0045 10/03/17 0601 10/04/17 0545  CREATININE 0.77 0.76 0.77 0.75 0.81   AKI in the setting of sepsis.  Resolved 10/01/17  PLAN KVO IVF BMET in AM Lasix 40 mg IV q8 x2 doses Replace electrolytes as indicated (Mg, Phos and K)  CNS A: delirium metabolic and post op pain, much improved, appropriate and following commands  P Minimize sedation Pain control as ordered  Anemia of critical illness -No evidence of postoperative bleeding Plan CBC in AM Transfuse per ICU protocol  FAMILY  - Family updated bedside  The patient is critically ill with multiple organ systems failure and requires high complexity decision making for assessment and support, frequent evaluation and titration of therapies, application of advanced monitoring technologies and extensive interpretation of multiple databases.   Critical Care Time devoted to patient care services described in this note is  34  Minutes. This time reflects time of care of this signee Dr Jennet Maduro. This critical care time does not reflect procedure time, or teaching time or supervisory time of PA/NP/Med student/Med Resident etc but could involve care discussion time.  Rush Farmer, M.D. [facility] Pulmonary/Critical Care Medicine. Pager: [phone]. After hours pager: [phone].  10/04/2017 9:58 AM

## 2017-10-04 NOTE — Progress Notes (Addendum)
Patient ID: Barbara [name], female   DOB: Aug 10, 1940, 77 y.o.   MRN: [id number]    5 Days Post-Op  Subjective: Pt still on vent.  Objective: Vital signs in last 24 hours: Temp:  [97.7 F (36.5 C)-99.4 F (37.4 C)] 99.2 F (37.3 C) (05/14 0739) Pulse Rate:  [76-92] 83 (05/14 0800) Resp:  [22-35] 24 (05/14 0800) BP: (119-156)/(46-111) 119/47 (05/14 0700) SpO2:  [93 %-97 %] 96 % (05/14 0800) FiO2 (%):  [40 %] 40 % (05/14 0400) Last BM Date: 09/30/17  Intake/Output from previous day: 05/13 0701 - 05/14 0700 In: 1911.2 [I.V.:1371.2; NG/GT:30; IV Piggyback:510] Out: 66 [Urine:5810; Emesis/NG output:100] Intake/Output this shift: Total I/O In: 60 [I.V.:60] Out: -   PE: Abd: soft, some BS, some stool starting to come out of Barbara [name] stoma.  Midline wound is clean and packed.  NGT with minimal output  Lab Results:  Recent Labs    10/03/17 0601 10/04/17 0440  WBC 12.9* 10.8*  HGB 9.4* 9.3*  HCT 28.2* 28.3*  PLT 284 260   BMET Recent Labs    10/03/17 0601 10/04/17 0545  NA 141 139  K 3.7 3.1*  CL 100* 93*  CO2 34* 37*  GLUCOSE 140* 208*  BUN 13 16  CREATININE 0.75 0.81  CALCIUM 7.4* 7.5*   PT/INR No results for input(s): LABPROT, INR in the last 72 hours. CMP     Component Value Date/Time   NA 139 10/04/2017 0545   K 3.1 (L) 10/04/2017 0545   CL 93 (L) 10/04/2017 0545   CO2 37 (H) 10/04/2017 0545   GLUCOSE 208 (H) 10/04/2017 0545   BUN 16 10/04/2017 0545   CREATININE 0.81 10/04/2017 0545   CREATININE 0.97 (H) 07/28/2015 1334   CALCIUM 7.5 (L) 10/04/2017 0545   PROT 4.4 (L) 10/04/2017 0545   ALBUMIN 1.3 (L) 10/04/2017 0545   AST 61 (H) 10/04/2017 0545   ALT 26 10/04/2017 0545   ALKPHOS 75 10/04/2017 0545   BILITOT 0.3 10/04/2017 0545   GFRNONAA >60 10/04/2017 0545   GFRAA >60 10/04/2017 0545   Lipase     Component Value Date/Time   LIPASE 19 10/01/2017 1135       Studies/Results: Dg Chest Port 1 View  Result Date: 10/04/2017 CLINICAL DATA:   History of endotracheal tube EXAM: PORTABLE CHEST 1 VIEW COMPARISON:  Yesterday FINDINGS: Endotracheal tube tip at the clavicular heads. An orogastric tube reaches the stomach. Right upper extremity PICC with tip at the upper SVC. Diffuse interstitial coarsening with perihilar indistinct opacity. Haziness at the right base attributed to small pleural effusion. Normal heart size. No pneumothorax. IMPRESSION: 1. Perihilar opacity likely reflecting pneumonia. No definite change from yesterday. 2. Stable hardware positioning. Electronically Signed   By: Monte Fantasia M.D.   On: 10/04/2017 07:50   Dg Chest Port 1 View  Result Date: 10/03/2017 CLINICAL DATA:  Short of breath EXAM: PORTABLE CHEST 1 VIEW COMPARISON:  10/02/2017 FINDINGS: Endotracheal tube 4 cm above the carina. Right arm PICC tip in the SVC. NG tube in the stomach Diffuse bilateral airspace disease unchanged. Small pleural effusions. No pneumothorax IMPRESSION: Support lines remain in good position. Diffuse bilateral airspace disease unchanged from yesterday Electronically Signed   By: Franchot Gallo M.D.   On: 10/03/2017 07:11   Dg Chest Port 1 View  Result Date: 10/02/2017 CLINICAL DATA:  Intubation EXAM: PORTABLE CHEST 1 VIEW COMPARISON:  Portable exam 1109 hours compared to 10/01/2017 FINDINGS: Tip of endotracheal tube projects 3.1 cm  above carina. Nasogastric tube extends into stomach. RIGHT arm PICC line tip projects over SVC. Upper normal heart size. Extensive BILATERAL pulmonary airspace infiltrates unchanged. Probable tiny LEFT pleural effusion. No pneumothorax. Bones demineralized. IMPRESSION: Persistent severe diffuse BILATERAL airspace infiltrates. Electronically Signed   By: Lavonia Dana M.D.   On: 10/02/2017 11:26    Anti-infectives: Anti-infectives (From admission, onward)   Start     Dose/Rate Route Frequency Ordered Stop   10/02/17 1800  meropenem (MERREM) 1 g in sodium chloride 0.9 % 100 mL IVPB     1 g 200 mL/hr over 30  Minutes Intravenous Every 8 hours 10/02/17 1043     09/27/17 1200  meropenem (MERREM) 1 g in sodium chloride 0.9 % 100 mL IVPB  Status:  Discontinued     1 g 200 mL/hr over 30 Minutes Intravenous Every 12 hours 09/27/17 1032 10/02/17 1043   09/27/17 1000  ciprofloxacin (CIPRO) IVPB 400 mg  Status:  Discontinued     400 mg 200 mL/hr over 60 Minutes Intravenous Every 12 hours 09/27/17 0547 09/27/17 1029   09/27/17 0800  metroNIDAZOLE (FLAGYL) IVPB 500 mg  Status:  Discontinued     500 mg 100 mL/hr over 60 Minutes Intravenous Every 8 hours 09/27/17 0547 09/27/17 1030   09/26/17 2315  ciprofloxacin (CIPRO) IVPB 400 mg     400 mg 200 mL/hr over 60 Minutes Intravenous  Once 09/26/17 2304 09/27/17 0032   09/26/17 2315  metroNIDAZOLE (FLAGYL) IVPB 500 mg     500 mg 100 mL/hr over 60 Minutes Intravenous  Once 09/26/17 2304 09/27/17 0229       Assessment/Plan Septic shock secondary to perforated diverticulitis -POD 5, s/p ex lap with Left/sigmoid colectomy with transverse end colostomy, 09-29-17 by Dr. Redmond Pulling -significant contamination, but ileus seems to be resolving with minimal NGT output and +BS with air in Barbara [name] bag -start trickle TFs today.  Would cont TNA for now until we make sure she manages TFs. -path shows diverticulitis -will place wound VAC on wound  SVT, s/p cardioversion The patient required cardioversion5/61for Barbara [name] SVT. She is on amiodarone and lopressor currently to control this. Cardiologyfollowing.  AKI -resolved.    Hypotension Improved  Respiratory Failure, VDRF/Tobacco abuse On vent, ?wean to extubated today per RN. Defer to CCM  CVA HTN hypoerlipidemia DM Chronic back pain  FEN - NPO/NGT, if ext could DC NGT and give clears, if remains on vent could trickle TFs VTE - SCDs/heparin ID - Merrem 5/7 -->     LOS: 7 days    Henreitta Cea , [facility] 10/04/2017, 8:14 AM Pager: [phone]

## 2017-10-04 NOTE — Progress Notes (Signed)
[place] CONSULT NOTE   Pharmacy Consult for TPN Indication: Prolonged Ileus  Patient Measurements: Height: 5\' 3"  (160 cm) Weight: 157 lb 3 oz (71.3 kg) IBW/kg (Calculated) : 52.4 TPN AdjBW (KG): 64.8 Body mass index is 27.84 kg/m.  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 Significant contamination was noted with high suspicion for post-op ileus. Pharmacy was consulted for TPN.   Patient unable to provide history currently. Daughter reports patient has taken in little over the past 2 weeks due to pain and nausea. She states patient was watching weight and meticulous about meal intake.   GI: Concern for post-op ileus. No further surgeries planned at this time. Path showing diverticulitis. NGT output 100 mL. LBM 5/10. Prealb <5. IV Protonix Endo: DM on Lantus 20 qd + SSI at home. CBGs currently 113-264 but improved since TPN started on SSI + 30 units of regular insulin in TPN. Insulin requirements in the past 24 hours: 3 units/24hrs.  Lytes:  K 3.1, Mg 1.8, Phos 2.9, CoCa 9.7. Renal: SCr 0.8s (stable), BUN 16, UOP 3.4 mL/kg/hr. D5-1/2NS at [facility] Pulm: on vent Cards: BP normal, HR 70-80s, off amio. Metoprolol 2.5mg  IV q6h. Hepatobil: mild AST elevation, ALT/Tbili/TG wnl Neuro: RASS -1 ID: Merrem for perforated intra-abdominal infection. WBC down 10.8, Tm 99.4  TPN Access: PICC 09/28/16 TPN start date: 10/01/17 Nutritional Goals (per RD recommendations 10/01/17): KCal: 1522  Protein: 102-115 Fluid: per MD  Goal TPN rate is 70 ml/hr (provides 100% of needs)  Current Nutrition:  NPO TPN  Plan:  Trickle TF vs clears today depending on whether extubated - ok to stop TPN per PharmD discussion with CCM  Wean TPN off by decreasing rate to 25 ml/hr the last 2 hrs then off Continue moderate SSI q4h  and adjust as needed KCl 10 meq IV q1h x4 Consider adding basal insulin starting tonight since TPN being stopped D/c TPN labs and consult   Renold Genta, PharmD, BCPS Clinical Pharmacist Clinical phone for 10/04/2017 until 3p is [phone] After 3p, please call Main Rx at [phone] for assistance 10/04/2017 7:10 AM

## 2017-10-04 NOTE — Procedures (Signed)
Extubation Procedure Note  Patient Details:   Name: Barbara [name] DOB: July 20, 1940 MRN: [id number]   Airway Documentation:    Vent end date: 10/04/17 Vent end time: 1024   Evaluation  O2 sats: stable throughout Complications: No apparent complications Patient did tolerate procedure well. Bilateral Breath Sounds: Clear, Diminished   Yes   Patient was extubated to a 4L [place]. Cuff leak was heard prior to extubation. No stridor noted. RN and patients husband at bedside with RT during extubation.  Renato Gails [facility] 10/04/2017, 10:25 AM

## 2017-10-05 ENCOUNTER — Inpatient Hospital Stay (HOSPITAL_COMMUNITY): Payer: Medicare Other

## 2017-10-05 DIAGNOSIS — R5381 Other malaise: Secondary | ICD-10-CM

## 2017-10-05 LAB — BASIC METABOLIC PANEL
ANION GAP: 12 (ref 5–15)
BUN: 17 mg/dL (ref 6–20)
CALCIUM: 7.6 mg/dL — AB (ref 8.9–10.3)
CO2: 34 mmol/L — ABNORMAL HIGH (ref 22–32)
Chloride: 90 mmol/L — ABNORMAL LOW (ref 101–111)
Creatinine, Ser: 0.8 mg/dL (ref 0.44–1.00)
GLUCOSE: 127 mg/dL — AB (ref 65–99)
POTASSIUM: 3.4 mmol/L — AB (ref 3.5–5.1)
Sodium: 136 mmol/L (ref 135–145)

## 2017-10-05 LAB — GLUCOSE, CAPILLARY
GLUCOSE-CAPILLARY: 47 mg/dL — AB (ref 65–99)
GLUCOSE-CAPILLARY: 99 mg/dL (ref 65–99)
GLUCOSE-CAPILLARY: 134 mg/dL — AB (ref 65–99)
Glucose-Capillary: 93 mg/dL (ref 65–99)
Glucose-Capillary: 119 mg/dL — ABNORMAL HIGH (ref 65–99)
Glucose-Capillary: 190 mg/dL — ABNORMAL HIGH (ref 65–99)
Glucose-Capillary: 149 mg/dL — ABNORMAL HIGH (ref 65–99)

## 2017-10-05 LAB — MAGNESIUM: MAGNESIUM: 2.1 mg/dL (ref 1.7–2.4)

## 2017-10-05 LAB — CBC WITH DIFFERENTIAL/PLATELET
ABS IMMATURE GRANULOCYTES: 0.3 10*3/uL — AB (ref 0.0–0.1)
Basophils Absolute: 0 10*3/uL (ref 0.0–0.1)
Basophils Relative: 0 %
Eosinophils Absolute: 0.6 10*3/uL (ref 0.0–0.7)
Eosinophils Relative: 5 %
HEMATOCRIT: 28.9 % — AB (ref 36.0–46.0)
HEMOGLOBIN: 9.8 g/dL — AB (ref 12.0–15.0)
Immature Granulocytes: 2 %
LYMPHS ABS: 1.4 10*3/uL (ref 0.7–4.0)
LYMPHS PCT: 11 %
MCH: 29.2 pg (ref 26.0–34.0)
MCHC: 33.9 g/dL (ref 30.0–36.0)
MCV: 86 fL (ref 78.0–100.0)
MONO ABS: 1.8 10*3/uL — AB (ref 0.1–1.0)
Monocytes Relative: 14 %
NEUTROS ABS: 9.2 10*3/uL — AB (ref 1.7–7.7)
Neutrophils Relative %: 68 %
Platelets: 275 10*3/uL (ref 150–400)
RBC: 3.36 MIL/uL — AB (ref 3.87–5.11)
RDW: 13.6 % (ref 11.5–15.5)
WBC: 13.4 10*3/uL — AB (ref 4.0–10.5)

## 2017-10-05 LAB — PHOSPHORUS: PHOSPHORUS: 3.5 mg/dL (ref 2.5–4.6)

## 2017-10-05 MED ORDER — PANTOPRAZOLE SODIUM 40 MG PO TBEC
40.0000 mg | DELAYED_RELEASE_TABLET | Freq: Every day | ORAL | Status: DC
  Administered 2017-10-05 – 2017-10-09 (×5): 40 mg via ORAL
  Filled 2017-10-05 (×5): qty 1

## 2017-10-05 MED ORDER — SODIUM CHLORIDE 0.9 % IV BOLUS
500.0000 mL | Freq: Once | INTRAVENOUS | Status: AC
  Administered 2017-10-05: 500 mL via INTRAVENOUS

## 2017-10-05 MED ORDER — ENSURE ENLIVE PO LIQD
237.0000 mL | Freq: Two times a day (BID) | ORAL | Status: DC
  Administered 2017-10-05 – 2017-10-10 (×4): 237 mL via ORAL

## 2017-10-05 MED ORDER — HYDROMORPHONE HCL 1 MG/ML IJ SOLN
1.0000 mg | Freq: Once | INTRAMUSCULAR | Status: AC
  Administered 2017-10-05: 1 mg via INTRAVENOUS
  Filled 2017-10-05: qty 1

## 2017-10-05 MED ORDER — POTASSIUM CHLORIDE CRYS ER 20 MEQ PO TBCR
40.0000 meq | EXTENDED_RELEASE_TABLET | Freq: Three times a day (TID) | ORAL | Status: AC
  Administered 2017-10-05 (×2): 40 meq via ORAL
  Filled 2017-10-05 (×2): qty 2

## 2017-10-05 MED ORDER — DEXTROSE 50 % IV SOLN
INTRAVENOUS | Status: AC
  Administered 2017-10-05: 25 mL
  Filled 2017-10-05: qty 50

## 2017-10-05 MED ORDER — METOPROLOL TARTRATE 12.5 MG HALF TABLET
12.5000 mg | ORAL_TABLET | Freq: Two times a day (BID) | ORAL | Status: DC
  Administered 2017-10-05 – 2017-10-10 (×7): 12.5 mg via ORAL
  Filled 2017-10-05 (×10): qty 1

## 2017-10-05 NOTE — Progress Notes (Signed)
PCCM Interval Progress Note  Asked to assess pt at bedside by [facility] MD.  Pt had increasing LLE and abd pain.  Also very restless due to pain.  States pain is different than before and much more intense..  RN noted reduced pulses and slightly cooler LLE.  Abd exam unchanged from earlier.  On my exam, LLE slightly cooler compared to RLE.  DP and PT pulses intact.  Sensation intact, pt able to move foot and toes. Abd tender but no distention.  BS x 4.  Will order KUB to assess for changes. Will give 1 time dose 1mg  dilaudid given increased pain and restlessness.   Montey Hora, [place] Pul[facility] & Critical [facility] Medicine Pager: [phone]  or [phone] 10/05/2017, 8:50 PM

## 2017-10-05 NOTE — Progress Notes (Signed)
Patient complaining of increase left lower abdomen pain radiating down her left leg. Left leg painful and numb. Left upper thigh warm to the touch, however no more warm than the right thigh,  left foot colder than right foot with weak pulse. [facility] nurse notified. Awaiting call from MD

## 2017-10-05 NOTE — Progress Notes (Signed)
PULMONARY / CRITICAL CARE MEDICINE   Name: Barbara [name] MRN: [id number] DOB: 09-10-1940    ADMISSION DATE:  09/26/2017 CONSULTATION DATE:  rapid wide-complex tachycardia  REFERRING MD:  Redmond Pulling  CHIEF COMPLAINT:  Sepsis and septic shock  BRIEF 77 year old female with prior history of perforated sigmoid diverticulitis.  Was actually discharged on 5/6 on antibiotics and analgesia.  Over the following 24 hours developed worsening abdominal pain, nausea and vomiting.  Repeat abdomen CT demonstrated larger abdominal abscess with cavitation.  She had rising white blood cell count as well as hypotension which initially was responsive to fluid.  She also has a history of paroxysmal atrial fibrillation but not on anticoagulation due to prior GI bleeding.  She is maintained on flecainide.  The a.m. 5/9 demonstrated complex tachycardia with a rate in the 200s requiring synchronized cardioversion.  She is now on amiodarone infusion, over the morning she has had intermittent hypotension and because of her severe sepsis critical care has been asked to consult  EVENTS Right upper extremity PICC line 09/26/17 - at [place] abdomen 5/7: Acute perforated diverticulitis. Pericolonic abscess extending between the bladder and uterus has increased in size and surrounding inflammation. Contrast and debris extends into the abscess cavity.  Urine culture 5/6 was negative  5/7 meropenem>>> Had been on antibiotics since 5/3 prior.   5/10./19 - Postop, hemodynamically stable.  Renal function improved.  Has developed some mild delirium since surgery also mild increase in oxygen need otherwise doing well  10/01/17 - rising wbc. Low Phos and getting repleted. CCS starting TPN. Overnight and throught his AM per rN - worsening delirium (yesterday only memory issues), lethargic, more labored respiratoion continues to be on 6L [place] like yesterday but pulse ox 89-91% per RN. Febrile to 100.87F . Dauighter wondring if intubation  would help patient and feels patient is post op pain. cXR worse. On amio gtt - seen by cards this AM  5/14 extubated  5/14 transfer to med-surg  SUBJECTIVE/OVERNIGHT/INTERVAL HX Tolerating extubation overnight without difficulty, no events, no new complaints  VITAL SIGNS: BP (!) 127/49   Pulse 83   Temp 98.4 F (36.9 C) (Oral)   Resp (!) 23   Ht 5\' 3"  (1.6 m)   Wt 148 lb 13 oz (67.5 kg)   SpO2 98%   BMI 26.36 kg/m    HEMODYNAMICS:    VENTILATOR SETTINGS:    INTAKE / OUTPUT: I/O last 3 completed shifts: In: 2490 [P.O.:180; I.V.:1400; IV Piggyback:910] Out: 7260 [Urine:7060; Emesis/NG output:200]  PHYSICAL EXAMINATION: General: Well appearing, mild painful distress with the vibration from construction Neuro: Awake and interactive, sitting in chair, moving all ext to command HEENT: [place]/AT, PERRL, EOM-I and MMM Heart: RRR, Nl S1/S2 and -M/R/G Lung: Bibasilar crackles Abdomen: Soft, diffusely tender, ND and +BS, wound noted Ext: -edema and -tenderness Skin: surgical wound noted  PULMONARY Recent Labs  Lab 09/29/17 1448 09/29/17 1644 09/29/17 1729 09/29/17 1804 10/01/17 1403 10/04/17 0345  PHART 7.284* 7.257* 7.334* 7.363 7.361 7.540*  PCO2ART 28.6* 38.6 38.8 39.8 55.5* 45.6  PO2ART 73.0* 243.0* 112.0* 112.0* 98.0 69.4*  HCO3 13.5* 17.2* 20.7 22.7 31.0* 38.9*  TCO2 14* 18* 22 24 33*  --   O2SAT 93.0 100.0 98.0 98.0 97.0 95.2   CBC Recent Labs  Lab 10/03/17 0601 10/04/17 0440 10/05/17 0437  HGB 9.4* 9.3* 9.8*  HCT 28.2* 28.3* 28.9*  WBC 12.9* 10.8* 13.4*  PLT 284 260 275   COAGULATION No results for input(s): INR in  the last 168 hours.  CARDIAC   Recent Labs  Lab 09/28/17 1419 10/01/17 1135 10/02/17 0425  TROPONINI 0.36* 0.35* 0.25*   No results for input(s): PROBNP in the last 168 hours.  CHEMISTRY Recent Labs  Lab 10/01/17 0402  10/02/17 0425 10/03/17 0045 10/03/17 0601 10/04/17 0545 10/05/17 0437  NA 144   < > 145 140 141 139 136   K 4.0   < > 3.5 3.0* 3.7 3.1* 3.4*  CL 109   < > 107 98* 100* 93* 90*  CO2 29   < > 31 35* 34* 37* 34*  GLUCOSE 180*   < > 154* 221* 140* 208* 127*  BUN 13   < > 11 11 13 16 17   CREATININE 0.88   < > 0.76 0.77 0.75 0.81 0.80  CALCIUM 7.7*   < > 7.6* 7.2* 7.4* 7.5* 7.6*  MG 2.2  --  1.8  --  1.9 1.8 2.1  PHOS 1.7*  --  1.4*  --  2.4* 2.9 3.5   < > = values in this interval not displayed.   Estimated Creatinine Clearance: 56.9 mL/min (by C-G formula based on SCr of 0.8 mg/dL).  LIVER Recent Labs  Lab 10/01/17 1135 10/02/17 0425 10/03/17 0601 10/04/17 0545  AST 19 15 35 61*  ALT 14 12* 15 26  ALKPHOS 69 65 67 75  BILITOT 0.3 0.5 0.4 0.3  PROT 4.5* 4.3* 4.4* 4.4*  ALBUMIN 1.7* 1.5* 1.3* 1.3*   INFECTIOUS Recent Labs  Lab 09/29/17 1300 10/01/17 1135 10/02/17 0425 10/03/17 0601  LATICACIDVEN 1.5 1.4  --   --   PROCALCITON  --  2.72 1.91 0.87   ENDOCRINE CBG (last 3)  Recent Labs    10/05/17 0400 10/05/17 0435 10/05/17 0729  GLUCAP 47* 99 93   IMAGING x48h  - image(s) personally visualized  -   highlighted in bold Dg Chest Port 1 View  Result Date: 10/04/2017 CLINICAL DATA:  History of endotracheal tube EXAM: PORTABLE CHEST 1 VIEW COMPARISON:  Yesterday FINDINGS: Endotracheal tube tip at the clavicular heads. An orogastric tube reaches the stomach. Right upper extremity PICC with tip at the upper SVC. Diffuse interstitial coarsening with perihilar indistinct opacity. Haziness at the right base attributed to small pleural effusion. Normal heart size. No pneumothorax. IMPRESSION: 1. Perihilar opacity likely reflecting pneumonia. No definite change from yesterday. 2. Stable hardware positioning. Electronically Signed   By: Monte Fantasia M.D.   On: 10/04/2017 07:50   I reviewed CXR myself, improving pulmonary edema noted.  DISCUSSION: Perforated diverticulitis with abscess, resultant septic shock and mods as well as DKA.  She is now status post exploratory laparotomy  with sigmoid colectomy and transverse colectomy.  Hemodynamically stable anion gap is closed from a DKA standpoint renal functions normalized.  She does have some mild delirium and hypoxia today however ultimately seems to be doing well  ASSESSMENT / PLAN:  Severe sepsis, septic shock in the setting of perforated diverticulitis with new abscess, now postoperative day #1 for exploratory lap with sigmoid colectomy and transverse and colostomy on 5/9. -There is marked abdominal contamination per surgical note  Plan D/C TPN Full liquid diet D/C NGT  RESP A Acute post op respiratory failure  Plan Titrate O2 for sat of 88-92% IS per RT protocol Flutter valve D/C further lasix KVO IVF Monitor closely for airway protection  CVS WCT w/ h/o PAF w/ h/o CAD->required cardioversion X 1 EF 65-70%; No new WM abnormality  Plan Continue telemetry monitoring No further amiodarone Lopressor 12.5 mg PO BID with holding parameters  ENDOCRINE Diabetic ketoacidosis on transer to [facility]. Anion gap is closed and bic improved > 20 on 09/30/17  Plan CBG ISS Lantus 10 unit QHS SQ  RENAL Recent Labs  Lab 10/02/17 0425 10/03/17 0045 10/03/17 0601 10/04/17 0545 10/05/17 0437  CREATININE 0.76 0.77 0.75 0.81 0.80   AKI in the setting of sepsis.  Resolved 10/01/17  PLAN KVO IVF BMET in AM D/C further lasix Replace electrolytes as indicated (K)  CNS A: delirium metabolic and post op pain, much improved, appropriate and following commands  P Minimize sedation Pain control as ordered  Anemia of critical illness -No evidence of postoperative bleeding Plan CBC in AM Transfuse per ICU protocol  FAMILY  - Family updated bedside  Transfer to med-surg and to [facility] service with PCCM off 5/16.  Discussed with TRH-MD and PCCM-NP.  Rush Farmer, M.D. [facility] Pulmonary/Critical Care Medicine. Pager: [phone]. After hours pager: [phone].  10/05/2017 10:00 AM

## 2017-10-05 NOTE — Progress Notes (Signed)
CSW has provided resources to from desk of unit as asked by pt's daughter. Daughter expressed that she would pick them up from front desk so that pt's spouse is not aware of these resources. At this time there are no further needs. CSW will sign off. Please reconsult if new need arises.    Virgie Dad Leon Goodnow, MSW, [place] [facility] Clinical Social Worker [phone]

## 2017-10-05 NOTE — Progress Notes (Signed)
Nutrition Follow-up  DOCUMENTATION CODES:   Not applicable  INTERVENTION:    Ensure Enlive po BID, each supplement provides 350 kcal and 20 grams of protein  NUTRITION DIAGNOSIS:   Inadequate oral intake related to inability to eat as evidenced by (diet just advanced to full liquids).  Ongoing  GOAL:   Patient will meet greater than or equal to 90% of their needs  Unmet  MONITOR:   Diet advancement, PO intake, Supplement acceptance  ASSESSMENT:   77 yo female with PMH of DM, HTN, she was recently admitted to the hospital with sigmoid radiculitis and abscess, discharged on abx 5/5. Returned 5/6 with worsening abdominal pain along with nausea, vomiting, and diarrhea. Found with perforated diverticulitis with abscess, resultant septic shock and DKA. S/P exp lap with sigmoid colectomy and transverse colectomy.  Discussed patient in ICU rounds and with RN today. Extubated 5/14. S/P swallow evaluation with SLP 5/14; SLP recommends regular diet with thin liquids. Currently on full liquids.  Labs and medications reviewed.   Diet Order:   Diet Order           Diet full liquid Room service appropriate? Yes; Fluid consistency: Thin  Diet effective now          EDUCATION NEEDS:   Not appropriate for education at this time  Skin:  Skin Assessment: Reviewed RN Assessment(closed incision to abdomen)  Last BM:  5/15 (colostomy)  Height:   Ht Readings from Last 1 Encounters:  09/27/17 5\' 3"  (1.6 m)    Weight:   Wt Readings from Last 1 Encounters:  10/05/17 148 lb 13 oz (67.5 kg)    Ideal Body Weight:  52.27 kg  BMI:  Body mass index is 26.36 kg/m.  Estimated Nutritional Needs:   Kcal:  1600-1800  Protein:  90-100 gm  Fluid:  1.6-1.8 L    Molli Barrows, RD, LDN, [place] Pager [phone] After Hours Pager [phone]

## 2017-10-05 NOTE — Progress Notes (Signed)
6 Days Post-Op   Subjective/Chief Complaint: Tolerating extubation Tolerating clears Denies nausea   Objective: Vital signs in last 24 hours: Temp:  [98.2 F (36.8 C)-99.4 F (37.4 C)] 98.4 F (36.9 C) (05/15 0730) Pulse Rate:  [78-100] 86 (05/15 0700) Resp:  [22-33] 28 (05/15 0700) BP: (96-151)/(43-79) 108/79 (05/15 0700) SpO2:  [92 %-97 %] 94 % (05/15 0700) FiO2 (%):  [40 %] 40 % (05/14 0818) Weight:  [67.5 kg (148 lb 13 oz)] 67.5 kg (148 lb 13 oz) (05/15 0441) Last BM Date: 10/04/17  Intake/Output from previous day: 05/14 0701 - 05/15 0700 In: 1670 [P.O.:180; I.V.:680; IV Piggyback:810] Out: 9485 [Urine:4225; Emesis/NG output:100] Intake/Output this shift: No intake/output data recorded.  Exam: Awake and alert Abdomen soft, ostomy productive, VAC in place  Lab Results:  Recent Labs    10/04/17 0440 10/05/17 0437  WBC 10.8* 13.4*  HGB 9.3* 9.8*  HCT 28.3* 28.9*  PLT 260 275   BMET Recent Labs    10/04/17 0545 10/05/17 0437  NA 139 136  K 3.1* 3.4*  CL 93* 90*  CO2 37* 34*  GLUCOSE 208* 127*  BUN 16 17  CREATININE 0.81 0.80  CALCIUM 7.5* 7.6*   PT/INR No results for input(s): LABPROT, INR in the last 72 hours. ABG Recent Labs    10/04/17 0345  PHART 7.540*  HCO3 38.9*    Studies/Results: Dg Chest Port 1 View  Result Date: 10/04/2017 CLINICAL DATA:  History of endotracheal tube EXAM: PORTABLE CHEST 1 VIEW COMPARISON:  Yesterday FINDINGS: Endotracheal tube tip at the clavicular heads. An orogastric tube reaches the stomach. Right upper extremity PICC with tip at the upper SVC. Diffuse interstitial coarsening with perihilar indistinct opacity. Haziness at the right base attributed to small pleural effusion. Normal heart size. No pneumothorax. IMPRESSION: 1. Perihilar opacity likely reflecting pneumonia. No definite change from yesterday. 2. Stable hardware positioning. Electronically Signed   By: Monte Fantasia M.D.   On: 10/04/2017 07:50     Anti-infectives: Anti-infectives (From admission, onward)   Start     Dose/Rate Route Frequency Ordered Stop   10/02/17 1800  meropenem (MERREM) 1 g in sodium chloride 0.9 % 100 mL IVPB     1 g 200 mL/hr over 30 Minutes Intravenous Every 8 hours 10/02/17 1043     09/27/17 1200  meropenem (MERREM) 1 g in sodium chloride 0.9 % 100 mL IVPB  Status:  Discontinued     1 g 200 mL/hr over 30 Minutes Intravenous Every 12 hours 09/27/17 1032 10/02/17 1043   09/27/17 1000  ciprofloxacin (CIPRO) IVPB 400 mg  Status:  Discontinued     400 mg 200 mL/hr over 60 Minutes Intravenous Every 12 hours 09/27/17 0547 09/27/17 1029   09/27/17 0800  metroNIDAZOLE (FLAGYL) IVPB 500 mg  Status:  Discontinued     500 mg 100 mL/hr over 60 Minutes Intravenous Every 8 hours 09/27/17 0547 09/27/17 1030   09/26/17 2315  ciprofloxacin (CIPRO) IVPB 400 mg     400 mg 200 mL/hr over 60 Minutes Intravenous  Once 09/26/17 2304 09/27/17 0032   09/26/17 2315  metroNIDAZOLE (FLAGYL) IVPB 500 mg     500 mg 100 mL/hr over 60 Minutes Intravenous  Once 09/26/17 2304 09/27/17 0229      Assessment/Plan: s/p Procedure(s): EXPLORATORY LAPAROTOMY (N/A) EXTENDED LEFT HEMI COLECTOMY WITH COLOSTOMY CREATION (HARTMANN PROCEDURE) (Left)  Advance to full liquids Out of bed If WBC continues to go up, will get a CT of the abd/pelvis to  r/o abscess   LOS: 8 days    Mischele Detter A 10/05/2017

## 2017-10-05 NOTE — Progress Notes (Signed)
Physical Therapy Treatment Patient Details Name: Barbara [name] MRN: [id number] DOB: 08-29-1940 Today's Date: 10/05/2017    History of Present Illness 77 year old female with prior history of perforated sigmoid diverticulitis.  Was actually discharged on 5/6 on antibiotics and analgesia.  Over the following 24 hours developed worsening abdominal pain, nausea and vomiting.  Repeat abdomen CT demonstrated larger abdominal abscess with cavitation.  She had rising white blood cell count as well as hypotension which initially was responsive to fluid.  She also has a history of paroxysmal atrial fibrillation but not on anticoagulation due to prior GI bleeding.  She is maintained on flecainide.  The a.m. 5/9 demonstrated complex tachycardia with a rate in the 200s requiring synchronized cardioversion.  She is now on amiodarone infusion, over the morning she has had intermittent hypotension and because of her severe sepsis critical care has been asked to consult. 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  Intubated 5/11-5/14    PT Comments    Pt making slow progress towards her goals, however continues to be limited in her safe mobility by increased pain and decreased strength and endurance. Pt is maxAx2 for helicopter pad assisted bed mobility, and modAx2 for sit>stand transfers. Pt reports increased pain in standing and request to sit back on bed instead of trying to walk. Pt agrees to therex at the side of bed before requesting to lay back down due to increased abdominal pain. D/c plans remain appropriate if pt pain can be controlled to allow greater participation in therapy. PT will continue to follow acutely.     Follow Up Recommendations  CIR;Supervision/Assistance - 24 hour     Equipment Recommendations  Rolling walker with 5" wheels;3in1 (PT)    Recommendations for Other Services       Precautions / Restrictions Precautions Precautions: Fall Restrictions Weight Bearing Restrictions:  No    Mobility  Bed Mobility Overal bed mobility: Needs Assistance Bed Mobility: Supine to Sit     Supine to sit: Max assist;+2 for physical assistance     General bed mobility comments: Helicopter method to come to EOB with use of pad  Transfers Overall transfer level: Needs assistance Equipment used: 2 person hand held assist Transfers: Sit to/from Stand Sit to Stand: Mod assist;+2 physical assistance         General transfer comment: Pt able to power up with mod Ax2, able to stand for 90 sec before sitting back down  Ambulation/Gait             General Gait Details: not willing to try due to increased back pain in standing       Balance Overall balance assessment: Needs assistance Sitting-balance support: No upper extremity supported;Feet supported Sitting balance-Leahy Scale: Poor Sitting balance - Comments: needed bil UE support or external support to sit EOB.  Postural control: Posterior lean Standing balance support: Bilateral upper extremity supported;During functional activity Standing balance-Leahy Scale: Poor Standing balance comment: relies on bil UE support or external support.  Needed mod assist to stand of 2 persons.                             Cognition Arousal/Alertness: Lethargic;Suspect due to medications Behavior During Therapy: Flat affect;Anxious Overall Cognitive Status: Within Functional Limits for tasks assessed  Exercises General Exercises - Lower Extremity Long Arc Quad: AROM;Both;10 reps;Seated Hip ABduction/ADduction: AROM;Both;10 reps;Seated Toe Raises: AROM;10 reps;Both;Seated Heel Raises: AROM;Both;10 reps;Seated Other Exercises Other Exercises: Pt sat EoB for 5 minutes    General Comments General comments (skin integrity, edema, etc.): VSS      Pertinent Vitals/Pain Pain Assessment: No/denies pain Pain Score: 8  Pain Location: abdomen and back  Pain  Descriptors / Indicators: Aching;Grimacing;Guarding;Operative site guarding Pain Intervention(s): Limited activity within patient's tolerance;Monitored during session;Patient requesting pain meds-RN notified           PT Goals (current goals can now be found in the care plan section) Acute Rehab PT Goals Patient Stated Goal: to get better PT Goal Formulation: With patient Time For Goal Achievement: 10/14/17 Potential to Achieve Goals: Good Progress towards PT goals: Progressing toward goals    Frequency    Min 3X/week      PT Plan Current plan remains appropriate       AM-PAC PT "6 Clicks" Daily Activity  Outcome Measure  Difficulty turning over in bed (including adjusting bedclothes, sheets and blankets)?: Unable Difficulty moving from lying on back to sitting on the side of the bed? : Unable Difficulty sitting down on and standing up from a chair with arms (e.g., wheelchair, bedside commode, etc,.)?: Unable Help needed moving to and from a bed to chair (including a wheelchair)?: Total Help needed walking in hospital room?: Total Help needed climbing 3-5 steps with a railing? : Total 6 Click Score: 6    End of Session Equipment Utilized During Treatment: Gait belt;Oxygen(6L) Activity Tolerance: Patient limited by fatigue;Patient limited by pain Patient left: in chair;with call bell/phone within reach;with family/visitor present;with chair alarm set Nurse Communication: Mobility status;Need for lift equipment PT Visit Diagnosis: Unsteadiness on feet (R26.81);Muscle weakness (generalized) (M62.81);Pain Pain - part of body: (abdomen)     Time: 1440-1456 PT Time Calculation (min) (ACUTE ONLY): 16 min  Charges:  $Therapeutic Exercise: 8-22 mins                    G Codes:       Ricardo Kayes B. Migdalia Dk PT, DPT Acute Rehabilitation  [phone] Pager [phone]     [place] 10/05/2017, 4:21 PM
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
## 2017-10-05 NOTE — Progress Notes (Signed)
Report called to 5N. Pt blood pressure in the 16X systolic. Steadily decreasing since dilaudid was given, pressure 120/45 before meds given. Patient arouseable, alert and oriented x4 and following commands. MD made aware. 500cc bolus ordered. Will continue to monitor and transport once blood pressure sustains normal.

## 2017-10-05 NOTE — Progress Notes (Signed)
[place] Progress Note Patient Name: Barbara [name] DOB: Dec 28, 1940 MRN: [id number]   Date of Service  10/05/2017  HPI/Events of Note  S/p sigmoid perf with colectomy. RN notes reduced pulses in LLE with cool extremity.   eICU Interventions  CCM will eval bedside.        Laverle Hobby 10/05/2017, 8:31 PM

## 2017-10-05 NOTE — Progress Notes (Signed)
Inpatient Diabetes Program Recommendations  AACE/ADA: New Consensus Statement on Inpatient Glycemic Control (2015)  Target Ranges:  Prepandial:   less than 140 mg/dL      Peak postprandial:   less than 180 mg/dL (1-2 hours)      Critically ill patients:  140 - 180 mg/dL   Results for JARRAH, SEHER (MRN [id number]) as of 10/05/2017 09:08  Ref. Range 10/04/2017 19:59 10/04/2017 23:52 10/05/2017 04:00 10/05/2017 04:35 10/05/2017 07:29  Glucose-Capillary Latest Ref Range: 65 - 99 mg/dL 152 (H) 89 47 (L) 99 93   Review of Glycemic Control  Diabetes history: DM 2 Outpatient Diabetes medications: Lantus 20 units q AM, Novolin R 2-10 units tid with meals Current orders for Inpatient glycemic control: Lantus 10 units qhs, Novolog Moderate Correction 0-15 units Q4 hours  Inpatient Diabetes Program Recommendations: Patient had hypoglycemia this am, 47 mg/dl at 0400. Please consider reducing Lantus to 6 units Q24H (based on 64 kg x 0.1 units).  Thanks,  Tama Headings RN, MSN, BC-ADM, [facility] Inpatient Diabetes Coordinator Team Pager [phone] (8a-5p)
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
## 2017-10-06 ENCOUNTER — Inpatient Hospital Stay (HOSPITAL_COMMUNITY): Payer: Medicare Other

## 2017-10-06 ENCOUNTER — Encounter (HOSPITAL_COMMUNITY): Payer: Medicare Other

## 2017-10-06 DIAGNOSIS — K651 Peritoneal abscess: Secondary | ICD-10-CM

## 2017-10-06 LAB — CBC WITH DIFFERENTIAL/PLATELET
ABS IMMATURE GRANULOCYTES: 0.2 10*3/uL — AB (ref 0.0–0.1)
BASOS PCT: 0 %
Basophils Absolute: 0 10*3/uL (ref 0.0–0.1)
EOS ABS: 0.6 10*3/uL (ref 0.0–0.7)
Eosinophils Relative: 5 %
HEMATOCRIT: 28 % — AB (ref 36.0–46.0)
Hemoglobin: 9.3 g/dL — ABNORMAL LOW (ref 12.0–15.0)
Immature Granulocytes: 2 %
Lymphocytes Relative: 10 %
Lymphs Abs: 1.4 10*3/uL (ref 0.7–4.0)
MCH: 29.2 pg (ref 26.0–34.0)
MCHC: 33.2 g/dL (ref 30.0–36.0)
MCV: 87.8 fL (ref 78.0–100.0)
MONO ABS: 1.7 10*3/uL — AB (ref 0.1–1.0)
MONOS PCT: 12 %
NEUTROS ABS: 9.8 10*3/uL — AB (ref 1.7–7.7)
Neutrophils Relative %: 71 %
PLATELETS: 298 10*3/uL (ref 150–400)
RBC: 3.19 MIL/uL — ABNORMAL LOW (ref 3.87–5.11)
RDW: 13.8 % (ref 11.5–15.5)
WBC: 13.8 10*3/uL — ABNORMAL HIGH (ref 4.0–10.5)

## 2017-10-06 LAB — BASIC METABOLIC PANEL
Anion gap: 8 (ref 5–15)
BUN: 18 mg/dL (ref 6–20)
CO2: 33 mmol/L — AB (ref 22–32)
CREATININE: 0.84 mg/dL (ref 0.44–1.00)
Calcium: 7.9 mg/dL — ABNORMAL LOW (ref 8.9–10.3)
Chloride: 96 mmol/L — ABNORMAL LOW (ref 101–111)
GFR calc non Af Amer: >60 mL/min (ref >60)
Glucose, Bld: 62 mg/dL — ABNORMAL LOW (ref 65–99)
POTASSIUM: 4.4 mmol/L (ref 3.5–5.1)
Sodium: 137 mmol/L (ref 135–145)

## 2017-10-06 LAB — GLUCOSE, CAPILLARY
GLUCOSE-CAPILLARY: 214 mg/dL — AB (ref 65–99)
GLUCOSE-CAPILLARY: 136 mg/dL — AB (ref 65–99)
GLUCOSE-CAPILLARY: 174 mg/dL — AB (ref 65–99)
Glucose-Capillary: 55 mg/dL — ABNORMAL LOW (ref 65–99)
Glucose-Capillary: 147 mg/dL — ABNORMAL HIGH (ref 65–99)
Glucose-Capillary: 268 mg/dL — ABNORMAL HIGH (ref 65–99)

## 2017-10-06 LAB — PHOSPHORUS: Phosphorus: 2.5 mg/dL (ref 2.5–4.6)

## 2017-10-06 LAB — MAGNESIUM: Magnesium: 2 mg/dL (ref 1.7–2.4)

## 2017-10-06 MED ORDER — INSULIN GLARGINE 100 UNIT/ML ~~LOC~~ SOLN
6.0000 [IU] | Freq: Every day | SUBCUTANEOUS | Status: DC
  Administered 2017-10-06 – 2017-10-09 (×4): 6 [IU] via SUBCUTANEOUS
  Filled 2017-10-06 (×5): qty 0.06

## 2017-10-06 MED ORDER — OXYCODONE HCL 5 MG PO TABS
5.0000 mg | ORAL_TABLET | ORAL | Status: DC | PRN
  Administered 2017-10-06 – 2017-10-07 (×4): 5 mg via ORAL
  Filled 2017-10-06 (×4): qty 1

## 2017-10-06 MED ORDER — HYDROMORPHONE HCL 1 MG/ML IJ SOLN
0.5000 mg | INTRAMUSCULAR | Status: DC | PRN
  Administered 2017-10-06 – 2017-10-08 (×9): 1 mg via INTRAVENOUS
  Administered 2017-10-08: 0.5 mg via INTRAVENOUS
  Filled 2017-10-06 (×6): qty 1
  Filled 2017-10-06: qty 0.5
  Filled 2017-10-06 (×4): qty 1

## 2017-10-06 MED ORDER — IOPAMIDOL (ISOVUE-300) INJECTION 61%
INTRAVENOUS | Status: AC
  Filled 2017-10-06: qty 30

## 2017-10-06 MED ORDER — DEXTROSE 50 % IV SOLN
INTRAVENOUS | Status: AC
  Administered 2017-10-06: 50 mL
  Filled 2017-10-06: qty 50

## 2017-10-06 MED ORDER — IOHEXOL 300 MG/ML  SOLN
100.0000 mL | Freq: Once | INTRAMUSCULAR | Status: AC | PRN
  Administered 2017-10-06: 100 mL via INTRAVENOUS

## 2017-10-06 NOTE — Progress Notes (Signed)
Inpatient Rehabilitation  Attempted to meet with patient to discuss team's recommendation for IP Rehab; however, patient out of room, off floor at this time.  Will follow up as time allows.  Call if questions.   Carmelia Roller., CCC/SLP Admission Coordinator  [place]  Cell [phone]

## 2017-10-06 NOTE — Progress Notes (Signed)
PT Cancellation Note  Patient Details Name: Barbara [name] MRN: [id number] DOB: 1940/11/06   Cancelled Treatment:    Reason Eval/Treat Not Completed: Medical issues which prohibited therapy(pt is nauseous. RN notified of pt request for nausea medication. Will follow. )   Philomena Doheny 10/06/2017, 10:29 AM [phone]

## 2017-10-06 NOTE — Progress Notes (Signed)
7 Days Post-Op   Subjective/Chief Complaint: Started having left thigh pain last night that then moved into the pelvis and LLQ. Denies nausea   Objective: Vital signs in last 24 hours: Temp:  [98.1 F (36.7 C)-98.9 F (37.2 C)] (P) 98.2 F (36.8 C) (05/16 0500) Pulse Rate:  [63-90] (P) 78 (05/16 0500) Resp:  [16-26] (P) 18 (05/16 0500) BP: (86-139)/(36-85) (P) 118/44 (05/16 0500) SpO2:  [95 %-100 %] (P) 96 % (05/16 0500) Last BM Date: 10/04/17  Intake/Output from previous day: 05/15 0701 - 05/16 0700 In: 968.7 [P.O.:450; I.V.:218.7; IV Piggyback:300] Out: 1280 [Urine:1280] Intake/Output this shift: No intake/output data recorded.  Exam: Awake and alert Abdomen soft, VAC in place, ostomy viable, productive Left leg warm, palp femoral pulse  Lab Results:  Recent Labs    10/05/17 0437 10/06/17 0427  WBC 13.4* 13.8*  HGB 9.8* 9.3*  HCT 28.9* 28.0*  PLT 275 298   BMET Recent Labs    10/05/17 0437 10/06/17 0427  NA 136 137  K 3.4* 4.4  CL 90* 96*  CO2 34* 33*  GLUCOSE 127* 62*  BUN 17 18  CREATININE 0.80 0.84  CALCIUM 7.6* 7.9*   PT/INR No results for input(s): LABPROT, INR in the last 72 hours. ABG Recent Labs    10/04/17 0345  PHART 7.540*  HCO3 38.9*    Studies/Results: Dg Abd Portable 1v  Result Date: 10/05/2017 CLINICAL DATA:  Pain. Pt had abdominal surgery on 09/29/17. EXAM: PORTABLE ABDOMEN - 1 VIEW COMPARISON:  CT abdomen dated 09/26/2017. FINDINGS: Interval colostomy creation, LEFT abdomen. Overall bowel gas pattern is nonobstructive. No evidence of soft tissue mass or abnormal fluid collections seen. No acute or suspicious osseous finding. IMPRESSION: LEFT abdominal colostomy.  Nonobstructive bowel gas pattern. Electronically Signed   By: Franki Cabot M.D.   On: 10/05/2017 21:42    Anti-infectives: Anti-infectives (From admission, onward)   Start     Dose/Rate Route Frequency Ordered Stop   10/02/17 1800  meropenem (MERREM) 1 g in sodium  chloride 0.9 % 100 mL IVPB     1 g 200 mL/hr over 30 Minutes Intravenous Every 8 hours 10/02/17 1043     09/27/17 1200  meropenem (MERREM) 1 g in sodium chloride 0.9 % 100 mL IVPB  Status:  Discontinued     1 g 200 mL/hr over 30 Minutes Intravenous Every 12 hours 09/27/17 1032 10/02/17 1043   09/27/17 1000  ciprofloxacin (CIPRO) IVPB 400 mg  Status:  Discontinued     400 mg 200 mL/hr over 60 Minutes Intravenous Every 12 hours 09/27/17 0547 09/27/17 1029   09/27/17 0800  metroNIDAZOLE (FLAGYL) IVPB 500 mg  Status:  Discontinued     500 mg 100 mL/hr over 60 Minutes Intravenous Every 8 hours 09/27/17 0547 09/27/17 1030   09/26/17 2315  ciprofloxacin (CIPRO) IVPB 400 mg     400 mg 200 mL/hr over 60 Minutes Intravenous  Once 09/26/17 2304 09/27/17 0032   09/26/17 2315  metroNIDAZOLE (FLAGYL) IVPB 500 mg     500 mg 100 mL/hr over 60 Minutes Intravenous  Once 09/26/17 2304 09/27/17 0229      Assessment/Plan: s/p Procedure(s): EXPLORATORY LAPAROTOMY (N/A) EXTENDED LEFT HEMI COLECTOMY WITH COLOSTOMY CREATION (HARTMANN PROCEDURE) (Left)  Left leg/lower abdominal/pelvic pain which is new WBC still elevated.  For CT abdomen pelvis to evaluate for post op abscess  LOS: 9 days    Barbara [name] A 10/06/2017

## 2017-10-06 NOTE — Progress Notes (Signed)
Inpatient Rehabilitation  Met with patient and daughter, Manuela Schwartz at bedside to discuss team's recommendation for IP Rehab.  Shared booklets, insurance verification letter, and answered initial questions.  Await OT evaluation for insurance and given that patient has [place] Medicare, anticipate that s decision would not be reached until Monday.  Plan to follow for timing of medical readiness, insurance authorization, and IP Rehab bed availability.  Call if questions.    Carmelia Roller., CCC/SLP Admission Coordinator  [place]  Cell [phone]

## 2017-10-06 NOTE — Care Management Important Message (Signed)
Important Message  Patient Details  Name: Barbara [name] MRN: [id number] Date of Birth: May 21, 1941   Medicare Important Message Given:  Yes    Orbie Pyo 10/06/2017, 3:29 PM

## 2017-10-06 NOTE — NC FL2 (Signed)
[place] LEVEL OF CARE SCREENING TOOL     IDENTIFICATION  Patient Name: Barbara [name] Birthdate: 1940/08/21 Sex: female Admission Date (Current Location): 09/26/2017  [facility] and [place] Number:  [employer] and Address:  [facility] [place], [place] [address] [place], [place] [address]      Provider Number: [id number]  Attending Physician Name and Address:  Jonetta Osgood, MD  Relative Name and Phone Number:  Manuela Schwartz daughter, [phone]    Current Level of Care: Hospital Recommended Level of Care: [place] Prior Approval Number:    Date Approved/Denied:   PASRR Number: [id number]  Discharge Plan: SNF    Current Diagnoses: Patient Active Problem List   Diagnosis Date Noted  . Diabetic acidosis without coma ([place])   . Wide Complex Tachycardia/H/o SVT and H/o Afib 09/29/2017  . Sepsis ([place]) 09/29/2017  . Colonic diverticular abscess 09/27/2017  . Diverticulitis of both large and small intestine with perforation and abscess without bleeding 09/24/2017  . Unspecified atrial fibrillation ([place]) 09/23/2017  . Dizziness 07/28/2015  . Orthostatic hypotension 07/28/2015  . Rectal bleeding 12/05/2013  . Melena 12/05/2013  . Personal history of colonic polyps 12/05/2013  . Rapid atrial fibrillation ([place]) 08/15/2013  . Atrial fibrillation with RVR ([place]) 08/15/2013  . Right upper quadrant pain 11/16/2011  . Abdominal pain, acute, left lower quadrant 09/01/2011  . Cerebrovascular disease 10/09/2010  . Hypertension   . Hyperlipidemia   . Diabetes mellitus ([place])   . Arteriosclerotic cardiovascular disease (ASCVD)   . Macular degeneration   . Tobacco abuse   . Asthma   . Palpitations   . Chest pain     Orientation RESPIRATION BLADDER Height & Weight     Self, Time, Situation, Place  O2(Nasal cannula 4L) Incontinent, Indwelling catheter Weight: 67.5 kg (148 lb 13 oz) Height:  5\' 3"  (160 cm)  BEHAVIORAL  SYMPTOMS/MOOD NEUROLOGICAL BOWEL NUTRITION STATUS      Incontinent, Colostomy Diet(Please see DC Summary)  AMBULATORY STATUS COMMUNICATION OF NEEDS Skin   Extensive Assist Verbally Wound Vac, Surgical wounds(Closed incision on abdomin with wound vac 125 mmHg)                       Personal Care Assistance Level of Assistance  Bathing, Feeding, Dressing Bathing Assistance: Maximum assistance Feeding assistance: Limited assistance Dressing Assistance: Limited assistance     Functional Limitations Info  Sight, Hearing, Speech Sight Info: Impaired Hearing Info: Adequate Speech Info: Adequate    SPECIAL CARE FACTORS FREQUENCY  PT (By licensed PT), OT (By licensed OT)     PT Frequency: 5x/week OT Frequency: 3x/week            Contractures      Additional Factors Info  Code Status, Allergies, Insulin Sliding Scale Code Status Info: Full Allergies Info: Bee Venom, Sulfamethoxazole, Penicillins, Sulfonamide Derivatives   Insulin Sliding Scale Info: Every 4 hours and daily at bedtime       Current Medications (10/06/2017):  This is the current hospital active medication list Current Facility-Administered Medications  Medication Dose Route Frequency Provider Last Rate Last Dose  . acetaminophen (TYLENOL) suppository 650 mg  650 mg Rectal Q6H PRN Deterding, Guadelupe Sabin, MD      . brimonidine (ALPHAGAN) 0.2 % ophthalmic solution 1 drop  1 drop Both Eyes TID Greer Pickerel, MD   1 drop at 10/06/17 0932  . chlorhexidine (PERIDEX) 0.12 % solution 15 mL  15 mL  Mouth Rinse BID Rush Farmer, MD   15 mL at 10/06/17 0932  . Chlorhexidine Gluconate Cloth 2 % PADS 6 each  6 each Topical Daily Greer Pickerel, MD   6 each at 10/06/17 [phone]  . dextrose 5 %-0.45 % sodium chloride infusion   Intravenous Continuous Deterding, Guadelupe Sabin, MD 10 mL/hr at 10/06/17 [phone]    . feeding supplement (ENSURE ENLIVE) (ENSURE ENLIVE) liquid 237 mL  237 mL Oral BID BM Rush Farmer, MD   237 mL at  10/05/17 1748  . heparin injection 5,000 Units  5,000 Units Subcutaneous Q8H Greer Pickerel, MD   5,000 Units at 10/06/17 [phone]  . HYDROmorphone (DILAUDID) injection 0.5-1 mg  0.5-1 mg Intravenous Q4H PRN Jonetta Osgood, MD   1 mg at 10/06/17 1044  . insulin aspart (novoLOG) injection 0-15 Units  0-15 Units Subcutaneous Q4H Priscella Mann, RPH   2 Units at 10/06/17 0007  . insulin glargine (LANTUS) injection 6 Units  6 Units Subcutaneous QHS Ghimire, Shanker M, MD      . iopamidol (ISOVUE-300) 61 % injection           . MEDLINE mouth rinse  15 mL Mouth Rinse q12n4p Rush Farmer, MD   15 mL at 10/05/17 1532  . meropenem (MERREM) 1 g in sodium chloride 0.9 % 100 mL IVPB  1 g Intravenous Q8H Rumbarger, Valeda Malm, RPH   Stopped at 10/06/17 1224  . methocarbamol (ROBAXIN) 500 mg in dextrose 5 % 50 mL IVPB  500 mg Intravenous Q8H PRN Saverio Danker, PA-C      . metoprolol tartrate (LOPRESSOR) tablet 12.5 mg  12.5 mg Oral BID Rush Farmer, MD   12.5 mg at 10/06/17 0932  . ondansetron (ZOFRAN) tablet 4 mg  4 mg Oral Q6H PRN Greer Pickerel, MD       Or  . ondansetron [facility]) injection 4 mg  4 mg Intravenous Q6H PRN Greer Pickerel, MD   4 mg at 10/06/17 1044  . oxyCODONE (Oxy IR/ROXICODONE) immediate release tablet 5 mg  5 mg Oral Q4H PRN Coralie Keens, MD   5 mg at 10/06/17 1325  . pantoprazole (PROTONIX) EC tablet 40 mg  40 mg Oral QHS Rush Farmer, MD   40 mg at 10/05/17 2240  . sodium chloride flush (NS) 0.9 % injection 10-40 mL  10-40 mL Intracatheter PRN Greer Pickerel, MD      . white petrolatum (VASELINE) gel   Topical PRN Erick Colace, NP         Discharge Medications: Please see discharge summary for a list of discharge medications.  Relevant Imaging Results:  Relevant Lab Results:   Additional Information SSN: [place], [place]

## 2017-10-06 NOTE — Progress Notes (Signed)
PROGRESS NOTE        PATIENT DETAILS Name: Barbara [name] Age: 77 y.o. Sex: female Date of Birth: [birth year] Admit Date: 09/26/2017 Admitting Physician Oswald Hillock, MD [id number], Carloyn Manner, MD  Brief Narrative: Patient is a 77 y.o. female with history of DM-2, hypertension, recently discharged from any pain hospital on 5/5 after being treated for sigmoid diverticulitis with abscess-presented to the hospital on 5/6 with worsening abdominal pain, and to have worsening diverticular perforation-plans were to undergo a partial colectomy/colostomy-but had a episode of unstable wide-complex tachycardia with hypotension requiring synchronized cardioversion, patient was subsequently transferred to [facility] underwent a colectomy with colostomy placement on 5/9, postoperatively patient developed worsening hypoxic respiratory failure requiring intubation.  She was managed in the intensive care unit, and subsequently transferred to the hospitalist service on 5/16.  Subjective: Complains of worsening abdominal pain and left upper thigh pain.  No chest pain or shortness of breath.  Assessment/Plan: Acute hypoxic respiratory failure: Occurred in the postoperative setting-requiring intubation-extubated on 5/14.  Encourage incentive spirometry-currently stable on oxygen via nasal cannula 3- 4 L a minute.  Titrate off oxygen as tolerated  Severe sepsis in the setting of perforated diverticulitis with abscess: Sepsis pathophysiology has resolved-patient underwent exploratory laparotomy with colectomy and colostomy placement on 5/9.  Remains on meropenem-with worsening abdominal pain overnight-await a repeat CT scan of the abdomen.  Continue close observation.  Acute metabolic encephalopathy: Much improved-reviewed ICU notes-patient apparently had significant amount of delirium and postop pain.  Plans are to minimize sedation-narcotics as much as possible-follow.  Left thigh  pain: Developed last night-along with abdominal pain-await CT of the abdomen.  Some of her toes are slightly colder than the other toe but her foot is warm-have ordered a Doppler with ABIs.  There is no swelling or inflammation in the left thigh area.  Follow.  Symptomatic wide-complex tachycardia: occurred on 5/8 requiring emergent synchronized cardioversion with 200 J.  Was briefly maintained on amiodarone evaluated by electrophysiology-thought to have SVT with aberrancy  Acute kidney injury: Resolved-likely hemodynamically mediated.  Diabetic ketoacidosis: Resolved  Anemia: Likely secondary to acute illness-follow for now.  DM-2: Hypoglycemia earlier this morning-decrease Lantus to 6 units-continue to SSI.  Follow  History of paroxysmal atrial fibrillation/SVT: Review of prior notes indicate patient not on anticoagulation due to prior history of bleeding-continue metoprolol.  Sinus rhythm on telemetry  Telemetry (independently reviewed): sinus rhythm  DVT Prophylaxis: Prophylactic Heparin   Code Status: Full code  Family Communication: Daughter at bedside  Disposition Plan: Remain inpatient-not ready for d/c yet  Antimicrobial agents: Anti-infectives (From admission, onward)   Start     Dose/Rate Route Frequency Ordered Stop   10/02/17 1800  meropenem (MERREM) 1 g in sodium chloride 0.9 % 100 mL IVPB     1 g 200 mL/hr over 30 Minutes Intravenous Every 8 hours 10/02/17 1043     09/27/17 1200  meropenem (MERREM) 1 g in sodium chloride 0.9 % 100 mL IVPB  Status:  Discontinued     1 g 200 mL/hr over 30 Minutes Intravenous Every 12 hours 09/27/17 1032 10/02/17 1043   09/27/17 1000  ciprofloxacin (CIPRO) IVPB 400 mg  Status:  Discontinued     400 mg 200 mL/hr over 60 Minutes Intravenous Every 12 hours 09/27/17 0547 09/27/17 1029   09/27/17 0800  metroNIDAZOLE (FLAGYL) IVPB 500  mg  Status:  Discontinued     500 mg 100 mL/hr over 60 Minutes Intravenous Every 8 hours 09/27/17 0547  09/27/17 1030   09/26/17 2315  ciprofloxacin (CIPRO) IVPB 400 mg     400 mg 200 mL/hr over 60 Minutes Intravenous  Once 09/26/17 2304 09/27/17 0032   09/26/17 2315  metroNIDAZOLE (FLAGYL) IVPB 500 mg     500 mg 100 mL/hr over 60 Minutes Intravenous  Once 09/26/17 2304 09/27/17 0229      Procedures: 5/9>>EXPLORATORY LAPAROTOMY (N/A)LEFT & SIGMOID COLECTOMY WITH COLOSTOMY CREATION (HARTMANN PROCEDURE) (Left) 5/11>>ETT  CONSULTS:  cardiology, pulmonary/intensive care and general surgery  Time spent: 25-minutes-Greater than 50% of this time was spent in counseling, explanation of diagnosis, planning of further management, and coordination of care.  MEDICATIONS: Scheduled Meds: . brimonidine  1 drop Both Eyes TID  . chlorhexidine  15 mL Mouth Rinse BID  . Chlorhexidine Gluconate Cloth  6 each Topical Daily  . feeding supplement (ENSURE ENLIVE)  237 mL Oral BID BM  . heparin injection (subcutaneous)  5,000 Units Subcutaneous Q8H  . insulin aspart  0-15 Units Subcutaneous Q4H  . insulin glargine  6 Units Subcutaneous QHS  . iopamidol      . mouth rinse  15 mL Mouth Rinse q12n4p  . metoprolol tartrate  12.5 mg Oral BID  . pantoprazole  40 mg Oral QHS   Continuous Infusions: . dextrose 5 % and 0.45% NaCl 10 mL/hr at 10/06/17 0548  . meropenem (MERREM) IV 1 g (10/06/17 1154)  . methocarbamol (ROBAXIN)  IV     PRN Meds:.acetaminophen, HYDROmorphone (DILAUDID) injection, methocarbamol (ROBAXIN)  IV, ondansetron **OR** ondansetron (ZOFRAN) IV, oxyCODONE, sodium chloride flush, white petrolatum   PHYSICAL EXAM: Vital signs: Vitals:   10/06/17 0015 10/06/17 0100 10/06/17 0142 10/06/17 0500  BP: (!) 100/45 (!) 103/44 (!) 100/50 (!) 118/44  Pulse: 63  73 78  Resp: (!) 21  20 18   Temp:   98.1 F (36.7 C) 98.2 F (36.8 C)  TempSrc:   Oral Oral  SpO2: 96%  100% 96%  Weight:      Height:       Filed Weights   09/29/17 0437 10/03/17 0600 10/05/17 0441  Weight: 68.4 kg (150 lb  12.7 oz) 71.3 kg (157 lb 3 oz) 67.5 kg (148 lb 13 oz)   Body mass index is 26.36 kg/m.   General appearance :Awake, alert, not in any distress.  HEENT: Atraumatic and Normocephalic Neck: supple, no JVD.  Resp:Good air entry bilaterally, no added sounds  CVS: S1 S2 regular, no murmurs.  GI: Bowel sounds present-diffusely tender in the mid abdomen-ostomy present with stools.  Extremities: B/L Lower Ext shows no edema Neurology: Nonfocal Psychiatric: Normal judgment and insight. Alert and oriented x 3. Normal mood. Musculoskeletal:No digital cyanosis Skin:No Rash, warm and dry Wounds:N/A  I have personally reviewed following labs and imaging studies  LABORATORY DATA: CBC: Recent Labs  Lab 10/02/17 0425 10/03/17 0601 10/04/17 0440 10/05/17 0437 10/06/17 0427  WBC 13.7* 12.9* 10.8* 13.4* 13.8*  NEUTROABS 11.4* 9.7* 7.5 9.2* 9.8*  HGB 10.3* 9.4* 9.3* 9.8* 9.3*  HCT 30.7* 28.2* 28.3* 28.9* 28.0*  MCV 90.6 87.9 91.3 86.0 87.8  PLT 285 284 260 275 259    Basic Metabolic Panel: Recent Labs  Lab 10/02/17 0425 10/03/17 0045 10/03/17 0601 10/04/17 0545 10/05/17 0437 10/06/17 0427  NA 145 140 141 139 136 137  K 3.5 3.0* 3.7 3.1* 3.4* 4.4  CL 107 98*  100* 93* 90* 96*  CO2 31 35* 34* 37* 34* 33*  GLUCOSE 154* 221* 140* 208* 127* 62*  BUN 11 11 13 16 17 18   CREATININE 0.76 0.77 0.75 0.81 0.80 0.84  CALCIUM 7.6* 7.2* 7.4* 7.5* 7.6* 7.9*  MG 1.8  --  1.9 1.8 2.1 2.0  PHOS 1.4*  --  2.4* 2.9 3.5 2.5    GFR: Estimated Creatinine Clearance: 54.2 mL/min (by C-G formula based on SCr of 0.84 mg/dL).  Liver Function Tests: Recent Labs  Lab 10/01/17 1135 10/02/17 0425 10/03/17 0601 10/04/17 0545  AST 19 15 35 61*  ALT 14 12* 15 26  ALKPHOS 69 65 67 75  BILITOT 0.3 0.5 0.4 0.3  PROT 4.5* 4.3* 4.4* 4.4*  ALBUMIN 1.7* 1.5* 1.3* 1.3*   Recent Labs  Lab 10/01/17 1135  LIPASE 19   No results for input(s): AMMONIA in the last 168 hours.  Coagulation Profile: No  results for input(s): INR, PROTIME in the last 168 hours.  Cardiac Enzymes: Recent Labs  Lab 10/01/17 1135 10/02/17 0425  TROPONINI 0.35* 0.25*    BNP (last 3 results) No results for input(s): PROBNP in the last 8760 hours.  HbA1C: No results for input(s): HGBA1C in the last 72 hours.  CBG: Recent Labs  Lab 10/05/17 2328 10/06/17 0518 10/06/17 0557 10/06/17 0756 10/06/17 1156  GLUCAP 149* 55* 214* 147* 136*    Lipid Profile: No results for input(s): CHOL, HDL, LDLCALC, TRIG, CHOLHDL, LDLDIRECT in the last 72 hours.  Thyroid Function Tests: No results for input(s): TSH, T4TOTAL, FREET4, T3FREE, THYROIDAB in the last 72 hours.  Anemia Panel: No results for input(s): VITAMINB12, FOLATE, FERRITIN, TIBC, IRON, RETICCTPCT in the last 72 hours.  Urine analysis:    Component Value Date/Time   COLORURINE YELLOW 09/26/2017 2231   APPEARANCEUR CLEAR 09/26/2017 2231   LABSPEC 1.014 09/26/2017 2231   PHURINE 6.0 09/26/2017 2231   GLUCOSEU NEGATIVE 09/26/2017 2231   HGBUR SMALL (A) 09/26/2017 2231   BILIRUBINUR NEGATIVE 09/26/2017 2231   KETONESUR 20 (A) 09/26/2017 2231   PROTEINUR NEGATIVE 09/26/2017 2231   UROBILINOGEN 0.2 07/06/2009 1116   NITRITE NEGATIVE 09/26/2017 2231   LEUKOCYTESUR TRACE (A) 09/26/2017 2231    Sepsis Labs: Lactic Acid, Venous    Component Value Date/Time   LATICACIDVEN 1.4 10/01/2017 1135    MICROBIOLOGY: Recent Results (from the past 240 hour(s))  Urine culture     Status: None   Collection Time: 09/26/17 10:31 PM  Result Value Ref Range Status   Specimen Description   Final    URINE, CLEAN CATCH Performed at [facility], [address], [place], [place] [address]    Special Requests   Final    NONE Performed at [facility], [address] [place], [place] [address]    Culture   Final    NO GROWTH Performed at [facility], [place] [address], [place], [place] [address]    Report Status 09/28/2017 FINAL  Final  MRSA PCR  Screening     Status: None   Collection Time: 09/27/17  5:48 AM  Result Value Ref Range Status   MRSA by PCR NEGATIVE NEGATIVE Final    Comment:        The GeneXpert MRSA Assay (FDA approved for NASAL specimens only), is one component of a comprehensive MRSA colonization surveillance program. It is not intended to diagnose MRSA infection nor to guide or monitor treatment for MRSA infections. Performed at [facility], [phone]  [address] [place], [place] [address]     RADIOLOGY STUDIES/RESULTS: Ct Abdomen Pelvis W Contrast  Result Date: 09/27/2017 CLINICAL DATA:  77 y/o  F; lower abdominal pain. EXAM: CT ABDOMEN AND PELVIS WITH CONTRAST TECHNIQUE: Multidetector CT imaging of the abdomen and pelvis was performed using the standard protocol following bolus administration of intravenous contrast. CONTRAST:  138mL ISOVUE-300 IOPAMIDOL (ISOVUE-300) INJECTION 61% COMPARISON:  09/23/2017 CT abdomen and pelvis FINDINGS: Lower chest: No acute abnormality. Hepatobiliary: No focal liver abnormality is seen. No gallstones, gallbladder wall thickening, or biliary dilatation. Pancreas: Unremarkable. No pancreatic ductal dilatation or surrounding inflammatory changes. Spleen: Normal in size without focal abnormality. Adrenals/Urinary Tract: Adrenal glands are unremarkable. Kidneys are normal, without renal calculi, focal lesion, or hydronephrosis. Bladder is unremarkable. Stomach/Bowel: Acute perforated diverticulitis (series 5, image 49) with oblong fluid collection extending between the bladder and the uterus measuring 7.1 cm in length and 1.8 cm in diameter (series 2, image 67 and series 5, image 45), increased in size in comparison with the prior CT of the abdomen and pelvis. Contrast and debris is present within the perforated collection. Stomach and small bowel are unremarkable. Vascular/Lymphatic: Aortic atherosclerosis. No enlarged abdominal or pelvic lymph nodes. Reproductive: Uterus and bilateral adnexa  are unremarkable. Other: No abdominal wall hernia or abnormality. No abdominopelvic ascites. Musculoskeletal: No acute or significant osseous findings. IMPRESSION: Acute perforated diverticulitis. Pericolonic abscess extending between the bladder and uterus has increased in size and surrounding inflammation. Contrast and debris extends into the abscess cavity. Electronically Signed   By: Kristine Garbe M.D.   On: 09/27/2017 00:34   Ct Abdomen Pelvis W Contrast  Result Date: 09/23/2017 CLINICAL DATA:  Abdominal pain, diverticulitis suspected EXAM: CT ABDOMEN AND PELVIS WITH CONTRAST TECHNIQUE: Multidetector CT imaging of the abdomen and pelvis was performed using the standard protocol following bolus administration of intravenous contrast. CONTRAST:  186mL ISOVUE-300 IOPAMIDOL (ISOVUE-300) INJECTION 61% COMPARISON:  CT abdomen/pelvis dated 09/10/2017. FINDINGS: Lower chest: Lung bases are clear. Hepatobiliary: Liver is within normal limits. Gallbladder is unremarkable. No intrahepatic or extrahepatic ductal dilatation. Pancreas: Within normal limits. Spleen: Within normal limits. Adrenals/Urinary Tract: Adrenal glands are within normal limits. Kidneys within normal limits.  No hydronephrosis. Mildly thick-walled bladder. Stomach/Bowel: Stomach is within normal limits. No evidence of bowel obstruction. Prior appendectomy. Sigmoid diverticulosis, with mild pericolonic inflammatory changes in the left lower quadrant, reflecting acute diverticulitis. Associated 18 x 12 mm fluid/gas collection inferior to the sigmoid colon (series 2/image 60), suggesting a small pericolonic abscess, although this does not reflect a drainable fluid collection. No free air. Vascular/Lymphatic: No evidence of abdominal aortic aneurysm. Atherosclerotic calcifications of the abdominal aorta and branch vessels. No suspicious abdominopelvic lymphadenopathy. Reproductive: Uterus is within normal limits. Bilateral ovaries are within  normal limits. Other: No abdominopelvic ascites. Musculoskeletal: Mild degenerative changes at L5-S1. IMPRESSION: Acute diverticulitis. 1.8 cm fluid/gas collection adjacent to the sigmoid colon, suggesting a small pericolonic abscess, although this does not reflect a drainable collection. No free air. Electronically Signed   By: Julian Hy M.D.   On: 09/23/2017 20:51   Ct Abdomen Pelvis W Contrast  Result Date: 09/18/2017 CLINICAL DATA:  Left lower abdomen and suprapubic pain beginning this morning. EXAM: CT ABDOMEN AND PELVIS WITH CONTRAST TECHNIQUE: Multidetector CT imaging of the abdomen and pelvis was performed using the standard protocol following bolus administration of intravenous contrast. CONTRAST:  153mL ISOVUE-300 IOPAMIDOL (ISOVUE-300) INJECTION 61% COMPARISON:  11/30/2011 FINDINGS: Lower chest: No acute abnormality. Hepatobiliary: No focal liver abnormality is  seen. No gallstones, gallbladder wall thickening, or biliary dilatation. Pancreas: Unremarkable. No pancreatic ductal dilatation or surrounding inflammatory changes. Spleen: Normal in size without focal abnormality. Adrenals/Urinary Tract: Adrenal glands are unremarkable. Kidneys are normal, without renal calculi, focal lesion, or hydronephrosis. Bladder is unremarkable. Stomach/Bowel: Stomach and small bowel unremarkable. There are numerous left colon diverticula. No diverticulitis or other colonic inflammatory process. Vascular/Lymphatic: There is prominent aortic atherosclerosis extending to the iliac vessels. No pathologically enlarged lymph nodes. Reproductive: Uterus and bilateral adnexa are unremarkable. Other: No abdominal wall hernia or abnormality. No abdominopelvic ascites. Musculoskeletal: No fracture or acute finding. No osteoblastic or osteolytic lesions. IMPRESSION: 1. No acute findings. 2. Numerous left colon diverticula. No CT evidence of diverticulitis. 3. Fairly extensive aortic atherosclerosis. Electronically Signed    By: Lajean Manes M.D.   On: 09/18/2017 20:49   Dg Chest Port 1 View  Result Date: 10/04/2017 CLINICAL DATA:  History of endotracheal tube EXAM: PORTABLE CHEST 1 VIEW COMPARISON:  Yesterday FINDINGS: Endotracheal tube tip at the clavicular heads. An orogastric tube reaches the stomach. Right upper extremity PICC with tip at the upper SVC. Diffuse interstitial coarsening with perihilar indistinct opacity. Haziness at the right base attributed to small pleural effusion. Normal heart size. No pneumothorax. IMPRESSION: 1. Perihilar opacity likely reflecting pneumonia. No definite change from yesterday. 2. Stable hardware positioning. Electronically Signed   By: Monte Fantasia M.D.   On: 10/04/2017 07:50   Dg Chest Port 1 View  Result Date: 10/03/2017 CLINICAL DATA:  Short of breath EXAM: PORTABLE CHEST 1 VIEW COMPARISON:  10/02/2017 FINDINGS: Endotracheal tube 4 cm above the carina. Right arm PICC tip in the SVC. NG tube in the stomach Diffuse bilateral airspace disease unchanged. Small pleural effusions. No pneumothorax IMPRESSION: Support lines remain in good position. Diffuse bilateral airspace disease unchanged from yesterday Electronically Signed   By: Franchot Gallo M.D.   On: 10/03/2017 07:11   Dg Chest Port 1 View  Result Date: 10/02/2017 CLINICAL DATA:  Intubation EXAM: PORTABLE CHEST 1 VIEW COMPARISON:  Portable exam 1109 hours compared to 10/01/2017 FINDINGS: Tip of endotracheal tube projects 3.1 cm above carina. Nasogastric tube extends into stomach. RIGHT arm PICC line tip projects over SVC. Upper normal heart size. Extensive BILATERAL pulmonary airspace infiltrates unchanged. Probable tiny LEFT pleural effusion. No pneumothorax. Bones demineralized. IMPRESSION: Persistent severe diffuse BILATERAL airspace infiltrates. Electronically Signed   By: Lavonia Dana M.D.   On: 10/02/2017 11:26   Dg Chest Port 1 View  Result Date: 10/01/2017 CLINICAL DATA:  Verify endotracheal tube. EXAM: PORTABLE  CHEST 1 VIEW COMPARISON:  10/01/2017 FINDINGS: Endotracheal tube is in place with tip approximately 2.9 centimeters above the carina. Nasogastric tube is in place, tip overlying the level of the stomach and side-port in the region the gastroesophageal junction. A RIGHT-sided PICC line tip overlies the superior vena cava. Heart size is normal. There are airspace filling opacities bilaterally with central air bronchograms. There are small bilateral pleural effusions. IMPRESSION: 1. Interval placement of endotracheal tube, tip to be in good position. 2. Persistent bilateral airspace filling opacities and pleural effusions, similar to prior study. Electronically Signed   By: Nolon Nations M.D.   On: 10/01/2017 13:35   Dg Chest Port 1 View  Result Date: 10/01/2017 CLINICAL DATA:  Respiratory distress.  Follow-up. EXAM: PORTABLE CHEST 1 VIEW COMPARISON:  09/30/2017 FINDINGS: Nasogastric tube enters the abdomen. Right arm PICC tip is in the SVC above the right atrium. Widespread pulmonary opacity has worsened, consistent  with progressive edema. Small effusions and dependent atelectasis. IMPRESSION: Progressive/worsened diffuse edema pattern. Electronically Signed   By: Nelson Chimes M.D.   On: 10/01/2017 11:33   Dg Chest Port 1 View  Result Date: 09/30/2017 CLINICAL DATA:  Shortness of breath EXAM: PORTABLE CHEST 1 VIEW COMPARISON:  02/20/2014 FINDINGS: An orogastric tube reaches the stomach. Right upper extremity PICC with tip at the upper SVC. Patchy bilateral airspace opacity. Streaky opacities at the bases with probable small effusions. Normal heart size for technique. No pneumothorax. IMPRESSION: 1. Patchy bilateral lung opacity primarily concerning for pneumonia. 2. There is low volumes with streaky opacity, likely superimposed atelectasis. Electronically Signed   By: Monte Fantasia M.D.   On: 09/30/2017 11:32   Dg Abd Portable 1v  Result Date: 10/05/2017 CLINICAL DATA:  Pain. Pt had abdominal surgery  on 09/29/17. EXAM: PORTABLE ABDOMEN - 1 VIEW COMPARISON:  CT abdomen dated 09/26/2017. FINDINGS: Interval colostomy creation, LEFT abdomen. Overall bowel gas pattern is nonobstructive. No evidence of soft tissue mass or abnormal fluid collections seen. No acute or suspicious osseous finding. IMPRESSION: LEFT abdominal colostomy.  Nonobstructive bowel gas pattern. Electronically Signed   By: Franki Cabot M.D.   On: 10/05/2017 21:42     LOS: 9 days   Oren Binet, MD  [facility] Pager:[phone]  If 7PM-7AM, please contact night-coverage [email or web address] Password [email or web address] 10/06/2017, 1:07 PM

## 2017-10-06 NOTE — Progress Notes (Signed)
Hypoglycemic Event  CBG:  55 (10/06/17 0518)  Treatment: 1 amp D50 @0536  (attempted PO but pt could not tolerate)  Symptoms: Lethargic  Follow-up CBG: Time:  2952 CBG Result:  214  Possible Reasons for Event: Poor PO intake  Comments/MD notified:  Jenell Milliner, MD, [place]

## 2017-10-06 NOTE — Progress Notes (Addendum)
Inpatient Diabetes Program Recommendations  AACE/ADA: New Consensus Statement on Inpatient Glycemic Control (2015)  Target Ranges:  Prepandial:   less than 140 mg/dL      Peak postprandial:   less than 180 mg/dL (1-2 hours)      Critically ill patients:  140 - 180 mg/dL   Results for SHAVONA, GUNDERMAN (MRN [id number]) as of 10/06/2017 10:38  Ref. Range 10/05/2017 04:00 10/05/2017 04:35 10/05/2017 07:29 10/05/2017 11:06 10/05/2017 16:27 10/05/2017 19:42 10/05/2017 23:28 10/06/2017 05:18 10/06/2017 05:57 10/06/2017 07:56  Glucose-Capillary Latest Ref Range: 65 - 99 mg/dL 47 (L) 99 93 134 (H) 119 (H) 190 (H) 149 (H) 55 (L) 214 (H) 147 (H)   Review of Glycemic Control  Diabetes history: DM2 Outpatient Diabetes medications: Lantus 20 units QAM, Regular 2-10 units TID with meals as needed Current orders for Inpatient glycemic control: Lantus 10 units QHS, Novolog 0-15 units Q4H  Inpatient Diabetes Program Recommendations: Insulin - Basal: Fasting glucose low for past 2 days. Please decrease Lantus to 6 units QHS.  Thanks, Barnie Alderman, RN, MSN, CDE Diabetes Coordinator Inpatient Diabetes Program [phone] (Team Pager from 8am to 5pm)

## 2017-10-06 NOTE — Progress Notes (Signed)
Ms. Kaseman was transferred to 5w37 from 56M via bed.  Bedside report was given by Jabier Gauss, RN.  The patient is alert and oriented x4.  She is accompanied by her daughter.  Patient and her daughter are oriented to the room.  Bed is in the lowest position, bed alarm is activated.  Call bell and telephone are within reach.  Explained to patient how to use call bell and telephone, patient indicated understanding.  Will continue to monitor.

## 2017-10-06 NOTE — Progress Notes (Signed)
OT Cancellation Note  Patient Details Name: Barbara [name] MRN: [id number] DOB: [birth year]   Cancelled Treatment:    Reason Eval/Treat Not Completed: Patient at procedure or test/ unavailable  Ramond Dial, OT/L  OT Clinical Specialist [phone]  10/06/2017, 12:28 PM

## 2017-10-07 ENCOUNTER — Inpatient Hospital Stay (HOSPITAL_COMMUNITY): Payer: Medicare Other

## 2017-10-07 DIAGNOSIS — I10 Essential (primary) hypertension: Secondary | ICD-10-CM

## 2017-10-07 DIAGNOSIS — I4891 Unspecified atrial fibrillation: Secondary | ICD-10-CM

## 2017-10-07 DIAGNOSIS — R209 Unspecified disturbances of skin sensation: Secondary | ICD-10-CM

## 2017-10-07 LAB — CBC
HEMATOCRIT: 30 % — AB (ref 36.0–46.0)
HEMOGLOBIN: 9.5 g/dL — AB (ref 12.0–15.0)
MCH: 28.6 pg (ref 26.0–34.0)
MCHC: 31.7 g/dL (ref 30.0–36.0)
MCV: 90.4 fL (ref 78.0–100.0)
Platelets: 331 10*3/uL (ref 150–400)
RBC: 3.32 MIL/uL — AB (ref 3.87–5.11)
RDW: 14.1 % (ref 11.5–15.5)
WBC: 15.7 10*3/uL — AB (ref 4.0–10.5)

## 2017-10-07 LAB — GLUCOSE, CAPILLARY
GLUCOSE-CAPILLARY: 266 mg/dL — AB (ref 65–99)
GLUCOSE-CAPILLARY: 244 mg/dL — AB (ref 65–99)
GLUCOSE-CAPILLARY: 172 mg/dL — AB (ref 65–99)
Glucose-Capillary: 198 mg/dL — ABNORMAL HIGH (ref 65–99)
Glucose-Capillary: 199 mg/dL — ABNORMAL HIGH (ref 65–99)
Glucose-Capillary: 247 mg/dL — ABNORMAL HIGH (ref 65–99)
Glucose-Capillary: 268 mg/dL — ABNORMAL HIGH (ref 65–99)

## 2017-10-07 LAB — BASIC METABOLIC PANEL
ANION GAP: 13 (ref 5–15)
BUN: 22 mg/dL — ABNORMAL HIGH (ref 6–20)
CO2: 26 mmol/L (ref 22–32)
Calcium: 7.9 mg/dL — ABNORMAL LOW (ref 8.9–10.3)
Chloride: 95 mmol/L — ABNORMAL LOW (ref 101–111)
Creatinine, Ser: 1.18 mg/dL — ABNORMAL HIGH (ref 0.44–1.00)
GFR, EST AFRICAN AMERICAN: 51 mL/min — AB (ref >60)
GFR, EST NON AFRICAN AMERICAN: 44 mL/min — AB (ref >60)
GLUCOSE: 284 mg/dL — AB (ref 65–99)
POTASSIUM: 5.2 mmol/L — AB (ref 3.5–5.1)
Sodium: 134 mmol/L — ABNORMAL LOW (ref 135–145)

## 2017-10-07 MED ORDER — INSULIN ASPART 100 UNIT/ML ~~LOC~~ SOLN
3.0000 [IU] | Freq: Once | SUBCUTANEOUS | Status: AC
  Administered 2017-10-07: 3 [IU] via SUBCUTANEOUS

## 2017-10-07 MED ORDER — OXYCODONE HCL 5 MG PO TABS
5.0000 mg | ORAL_TABLET | ORAL | Status: DC | PRN
  Administered 2017-10-07 – 2017-10-08 (×5): 10 mg via ORAL
  Filled 2017-10-07 (×5): qty 2

## 2017-10-07 MED ORDER — INSULIN ASPART 100 UNIT/ML ~~LOC~~ SOLN
0.0000 [IU] | Freq: Three times a day (TID) | SUBCUTANEOUS | Status: DC
  Administered 2017-10-07 – 2017-10-08 (×3): 2 [IU] via SUBCUTANEOUS
  Administered 2017-10-08: 3 [IU] via SUBCUTANEOUS
  Administered 2017-10-09 (×2): 1 [IU] via SUBCUTANEOUS
  Administered 2017-10-10 (×2): 3 [IU] via SUBCUTANEOUS

## 2017-10-07 NOTE — Progress Notes (Signed)
PROGRESS NOTE        PATIENT DETAILS Name: Barbara [name] Age: 77 y.o. Sex: female Date of Birth: 1941-02-16 Admit Date: 09/26/2017 Admitting Physician Oswald Hillock, MD CNO:[id number], Carloyn Manner, MD  Brief Narrative: Patient is a 77 y.o. female with history of DM-2, hypertension, recently discharged from any pain hospital on 5/5 after being treated for sigmoid diverticulitis with abscess-presented to the hospital on 5/6 with worsening abdominal pain, and to have worsening diverticular perforation-plans were to undergo a partial colectomy/colostomy-but had a episode of unstable wide-complex tachycardia with hypotension requiring synchronized cardioversion, patient was subsequently transferred to [facility] underwent a colectomy with colostomy placement on 5/9, postoperatively patient developed worsening hypoxic respiratory failure requiring intubation.  She was managed in the intensive care unit, and subsequently transferred to the hospitalist service on 5/16.  Subjective: Abdominal pain has improved.  Complains of mild burning sensation in the left upper lateral thigh.  Assessment/Plan: Acute hypoxic respiratory failure: Occurred in the postoperative setting-requiring intubation-extubated on 5/14.  Encourage incentive spirometry-currently stable on oxygen via nasal cannula 3- 4 L a minute.  Titrate off oxygen as tolerated  Severe sepsis in the setting of perforated diverticulitis with abscess: Sepsis pathophysiology has resolved, she underwent exploratory laparotomy with colectomy and colostomy on 5/9.  Repeat CT scan of the abdomen on 5/16 did not show any residual abscess.  Per general surgery okay to discontinue IV meropenem  Acute metabolic encephalopathy: Resolved-reviewed ICU notes-patient has had significant amounts of delirium and postoperative pain.  Minimize narcotics/sedatives as much as possible.   Left thigh pain: Improved-this morning claims that she  only has a little bit of burning sensation in the lateral aspect of her left thigh-doubt any significant issues here.  Still awaiting ABIs.  Exam is benign without any swelling or erythema.  Symptomatic wide-complex tachycardia: occurred on 5/8 requiring emergent synchronized cardioversion with 200 J.  Was briefly maintained on amiodarone evaluated by electrophysiology-thought to have SVT with aberrancy  Acute kidney injury: Improved-suspect this was hemodynamically mediated in the setting of sepsis.  Mild hyperkalemia: 1 dose of Kayexalate.  Follow.  Diabetic ketoacidosis: Resolved  Anemia: Likely secondary to acute illness-follow for now.  DM-2: No further hypoglycemic episode-continue with 6 units of Lantus and SSI.  Follow.   History of paroxysmal atrial fibrillation/SVT: Review of prior notes indicate patient not on anticoagulation due to prior history of bleeding-continue metoprolol.  Sinus rhythm on telemetry  Telemetry (independently reviewed): sinus rhythm  DVT Prophylaxis: Prophylactic Heparin   Code Status: Full code  Family Communication: Daughter at bedside  Disposition Plan: Remain inpatient-hopefully CIR when bed available  Antimicrobial agents: Anti-infectives (From admission, onward)   Start     Dose/Rate Route Frequency Ordered Stop   10/02/17 1800  meropenem (MERREM) 1 g in sodium chloride 0.9 % 100 mL IVPB  Status:  Discontinued     1 g 200 mL/hr over 30 Minutes Intravenous Every 8 hours 10/02/17 1043 10/07/17 1311   09/27/17 1200  meropenem (MERREM) 1 g in sodium chloride 0.9 % 100 mL IVPB  Status:  Discontinued     1 g 200 mL/hr over 30 Minutes Intravenous Every 12 hours 09/27/17 1032 10/02/17 1043   09/27/17 1000  ciprofloxacin (CIPRO) IVPB 400 mg  Status:  Discontinued     400 mg 200 mL/hr over 60 Minutes Intravenous Every 12  hours 09/27/17 0547 09/27/17 1029   09/27/17 0800  metroNIDAZOLE (FLAGYL) IVPB 500 mg  Status:  Discontinued     500 mg 100  mL/hr over 60 Minutes Intravenous Every 8 hours 09/27/17 0547 09/27/17 1030   09/26/17 2315  ciprofloxacin (CIPRO) IVPB 400 mg     400 mg 200 mL/hr over 60 Minutes Intravenous  Once 09/26/17 2304 09/27/17 0032   09/26/17 2315  metroNIDAZOLE (FLAGYL) IVPB 500 mg     500 mg 100 mL/hr over 60 Minutes Intravenous  Once 09/26/17 2304 09/27/17 0229      Procedures: 5/9>>EXPLORATORY LAPAROTOMY (N/A)LEFT & SIGMOID COLECTOMY WITH COLOSTOMY CREATION (HARTMANN PROCEDURE) (Left) 5/11>>ETT  CONSULTS:  cardiology, pulmonary/intensive care and general surgery  Time spent: 25-minutes-Greater than 50% of this time was spent in counseling, explanation of diagnosis, planning of further management, and coordination of care.  MEDICATIONS: Scheduled Meds: . brimonidine  1 drop Both Eyes TID  . chlorhexidine  15 mL Mouth Rinse BID  . Chlorhexidine Gluconate Cloth  6 each Topical Daily  . feeding supplement (ENSURE ENLIVE)  237 mL Oral BID BM  . heparin injection (subcutaneous)  5,000 Units Subcutaneous Q8H  . insulin aspart  0-15 Units Subcutaneous Q4H  . insulin glargine  6 Units Subcutaneous QHS  . mouth rinse  15 mL Mouth Rinse q12n4p  . metoprolol tartrate  12.5 mg Oral BID  . pantoprazole  40 mg Oral QHS   Continuous Infusions: . dextrose 5 % and 0.45% NaCl Stopped (10/07/17 0207)  . methocarbamol (ROBAXIN)  IV     PRN Meds:.acetaminophen, HYDROmorphone (DILAUDID) injection, methocarbamol (ROBAXIN)  IV, ondansetron **OR** ondansetron (ZOFRAN) IV, oxyCODONE, sodium chloride flush, white petrolatum   PHYSICAL EXAM: Vital signs: Vitals:   10/06/17 1456 10/06/17 2158 10/07/17 0412 10/07/17 0416  BP: (!) 104/49 (!) 103/42  (!) 104/40  Pulse: 76 81  73  Resp: 19 18  17   Temp: 98.8 F (37.1 C) 98.1 F (36.7 C)  98 F (36.7 C)  TempSrc: Oral Oral  Oral  SpO2: 91% 96%  98%  Weight:   69.4 kg (153 lb)   Height:       Filed Weights   10/03/17 0600 10/05/17 0441 10/07/17 0412  Weight:  71.3 kg (157 lb 3 oz) 67.5 kg (148 lb 13 oz) 69.4 kg (153 lb)   Body mass index is 27.1 kg/m.   General appearance:Awake, alert, not in any distress.  Chronically ill-appearing Eyes:no scleral icterus. HEENT: Atraumatic and Normocephalic Neck: supple, no JVD. Resp:Good air entry bilaterally, few bibasilar rales CVS: S1 S2 regular, no murmurs.  GI: Bowel sounds present, VAC in place-ostomy bag just emptied and empty.  Soft just very mildly tender.   Extremities: B/L Lower Ext shows no edema, both legs are warm to touch Neurology:  Non focal Psychiatric: Normal judgment and insight. Normal mood. Musculoskeletal:No digital cyanosis Skin:No Rash, warm and dry Wounds:N/A  I have personally reviewed following labs and imaging studies  LABORATORY DATA: CBC: Recent Labs  Lab 10/02/17 0425 10/03/17 0601 10/04/17 0440 10/05/17 0437 10/06/17 0427 10/07/17 0408  WBC 13.7* 12.9* 10.8* 13.4* 13.8* 15.7*  NEUTROABS 11.4* 9.7* 7.5 9.2* 9.8*  --   HGB 10.3* 9.4* 9.3* 9.8* 9.3* 9.5*  HCT 30.7* 28.2* 28.3* 28.9* 28.0* 30.0*  MCV 90.6 87.9 91.3 86.0 87.8 90.4  PLT 285 284 260 275 298 841    Basic Metabolic Panel: Recent Labs  Lab 10/02/17 0425  10/03/17 0601 10/04/17 0545 10/05/17 0437 10/06/17 0427 10/07/17  0408  NA 145   < > 141 139 136 137 134*  K 3.5   < > 3.7 3.1* 3.4* 4.4 5.2*  CL 107   < > 100* 93* 90* 96* 95*  CO2 31   < > 34* 37* 34* 33* 26  GLUCOSE 154*   < > 140* 208* 127* 62* 284*  BUN 11   < > 13 16 17 18  22*  CREATININE 0.76   < > 0.75 0.81 0.80 0.84 1.18*  CALCIUM 7.6*   < > 7.4* 7.5* 7.6* 7.9* 7.9*  MG 1.8  --  1.9 1.8 2.1 2.0  --   PHOS 1.4*  --  2.4* 2.9 3.5 2.5  --    < > = values in this interval not displayed.    GFR: Estimated Creatinine Clearance: 39.1 mL/min (A) (by C-G formula based on SCr of 1.18 mg/dL (H)).  Liver Function Tests: Recent Labs  Lab 10/01/17 1135 10/02/17 0425 10/03/17 0601 10/04/17 0545  AST 19 15 35 61*  ALT 14 12* 15 26    ALKPHOS 69 65 67 75  BILITOT 0.3 0.5 0.4 0.3  PROT 4.5* 4.3* 4.4* 4.4*  ALBUMIN 1.7* 1.5* 1.3* 1.3*   Recent Labs  Lab 10/01/17 1135  LIPASE 19   No results for input(s): AMMONIA in the last 168 hours.  Coagulation Profile: No results for input(s): INR, PROTIME in the last 168 hours.  Cardiac Enzymes: Recent Labs  Lab 10/01/17 1135 10/02/17 0425  TROPONINI 0.35* 0.25*    BNP (last 3 results) No results for input(s): PROBNP in the last 8760 hours.  HbA1C: No results for input(s): HGBA1C in the last 72 hours.  CBG: Recent Labs  Lab 10/07/17 0023 10/07/17 0411 10/07/17 0537 10/07/17 0913 10/07/17 1204  GLUCAP 268* 266* 244* 247* 199*    Lipid Profile: No results for input(s): CHOL, HDL, LDLCALC, TRIG, CHOLHDL, LDLDIRECT in the last 72 hours.  Thyroid Function Tests: No results for input(s): TSH, T4TOTAL, FREET4, T3FREE, THYROIDAB in the last 72 hours.  Anemia Panel: No results for input(s): VITAMINB12, FOLATE, FERRITIN, TIBC, IRON, RETICCTPCT in the last 72 hours.  Urine analysis:    Component Value Date/Time   COLORURINE YELLOW 09/26/2017 2231   APPEARANCEUR CLEAR 09/26/2017 2231   LABSPEC 1.014 09/26/2017 2231   PHURINE 6.0 09/26/2017 2231   GLUCOSEU NEGATIVE 09/26/2017 2231   HGBUR SMALL (A) 09/26/2017 2231   BILIRUBINUR NEGATIVE 09/26/2017 2231   KETONESUR 20 (A) 09/26/2017 2231   PROTEINUR NEGATIVE 09/26/2017 2231   UROBILINOGEN 0.2 07/06/2009 1116   NITRITE NEGATIVE 09/26/2017 2231   LEUKOCYTESUR TRACE (A) 09/26/2017 2231    Sepsis Labs: Lactic Acid, Venous    Component Value Date/Time   LATICACIDVEN 1.4 10/01/2017 1135    MICROBIOLOGY: No results found for this or any previous visit (from the past 240 hour(s)).  RADIOLOGY STUDIES/RESULTS: Ct Abdomen Pelvis W Contrast  Result Date: 10/06/2017 CLINICAL DATA:  History of diverticulitis with perforation and abscess formation, recent colectomy with Hartmann's pouch creation EXAM: CT  ABDOMEN AND PELVIS WITH CONTRAST TECHNIQUE: Multidetector CT imaging of the abdomen and pelvis was performed using the standard protocol following bolus administration of intravenous contrast. CONTRAST:  164mL OMNIPAQUE IOHEXOL 300 MG/ML  SOLN COMPARISON:  09/26/2017 FINDINGS: Lower chest: Bibasilar atelectatic changes with small effusions are noted new from the prior exam. Hepatobiliary: Mild fatty infiltration of the liver is noted. Gallbladder is within normal limits. Pancreas: Unremarkable. No pancreatic ductal dilatation or surrounding inflammatory changes.  Spleen: Normal in size without focal abnormality. Adrenals/Urinary Tract: Adrenal glands are within normal limits. 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 No obstructive changes are seen. No renal calculi are noted. No focal mass is seen. The bladder is partially decompressed by Foley catheter. Air is noted within the bladder related to the prior instrumentation. Stomach/Bowel: Postoperative changes are now seen with a Hartmann's pouch in the pelvis. Left mid abdominal ostomy is noted. The appendix is not visualized consistent with a prior surgical history. No obstructive changes are noted. Mild small bowel edema is noted likely related to the recent surgery. Vascular/Lymphatic: Aortic atherosclerosis. No enlarged abdominal or pelvic lymph nodes. Reproductive: Uterus and bilateral adnexa are unremarkable. Other: Small amount of free fluid is noted within the pelvis likely related to the recent surgery. No findings to suggest focal abscess are noted. Musculoskeletal: Degenerative changes of lumbar spine are noted. No acute bony abnormality is seen. Edematous changes are noted in the subcutaneous tissues of the abdominal wall and pelvis. IMPRESSION: Postoperative changes consistent with the given clinical history. No recurrent abscess is identified. Mild free fluid is noted likely postoperative in nature. Mild small bowel edema which  may be related to the patient's underlying abdominal pain. No obstructive changes are noted. This is likely related to the recent surgery. Bilateral pleural effusions and bibasilar atelectatic changes new from the prior exam. No other focal abnormality is noted at this time. Electronically Signed   By: Inez Catalina M.D.   On: 10/06/2017 18:16   Ct Abdomen Pelvis W Contrast  Result Date: 09/27/2017 CLINICAL DATA:  77 y/o  F; lower abdominal pain. EXAM: CT ABDOMEN AND PELVIS WITH CONTRAST TECHNIQUE: Multidetector CT imaging of the abdomen and pelvis was performed using the standard protocol following bolus administration of intravenous contrast. CONTRAST:  160mL ISOVUE-300 IOPAMIDOL (ISOVUE-300) INJECTION 61% COMPARISON:  09/23/2017 CT abdomen and pelvis FINDINGS: Lower chest: No acute abnormality. Hepatobiliary: No focal liver abnormality is seen. No gallstones, gallbladder wall thickening, or biliary dilatation. Pancreas: Unremarkable. No pancreatic ductal dilatation or surrounding inflammatory changes. Spleen: Normal in size without focal abnormality. Adrenals/Urinary Tract: Adrenal glands are unremarkable. Kidneys are normal, without renal calculi, focal lesion, or hydronephrosis. Bladder is unremarkable. Stomach/Bowel: Acute perforated diverticulitis (series 5, image 49) with oblong fluid collection extending between the bladder and the uterus measuring 7.1 cm in length and 1.8 cm in diameter (series 2, image 67 and series 5, image 45), increased in size in comparison with the prior CT of the abdomen and pelvis. Contrast and debris is present within the perforated collection. Stomach and small bowel are unremarkable. Vascular/Lymphatic: Aortic atherosclerosis. No enlarged abdominal or pelvic lymph nodes. Reproductive: Uterus and bilateral adnexa are unremarkable. Other: No abdominal wall hernia or abnormality. No abdominopelvic ascites. Musculoskeletal: No acute or significant osseous findings. IMPRESSION:  Acute perforated diverticulitis. Pericolonic abscess extending between the bladder and uterus has increased in size and surrounding inflammation. Contrast and debris extends into the abscess cavity. Electronically Signed   By: Kristine Garbe M.D.   On: 09/27/2017 00:34   Ct Abdomen Pelvis W Contrast  Result Date: 09/23/2017 CLINICAL DATA:  Abdominal pain, diverticulitis suspected EXAM: CT ABDOMEN AND PELVIS WITH CONTRAST TECHNIQUE: Multidetector CT imaging of the abdomen and pelvis was performed using the standard protocol following bolus administration of intravenous contrast. CONTRAST:  192mL ISOVUE-300 IOPAMIDOL (ISOVUE-300) INJECTION 61% COMPARISON:  CT abdomen/pelvis dated 09/10/2017. FINDINGS: Lower chest: Lung bases are clear. Hepatobiliary: Liver is within normal limits. Gallbladder  is unremarkable. No intrahepatic or extrahepatic ductal dilatation. Pancreas: Within normal limits. Spleen: Within normal limits. Adrenals/Urinary Tract: Adrenal glands are within normal limits. Kidneys within normal limits.  No hydronephrosis. Mildly thick-walled bladder. Stomach/Bowel: Stomach is within normal limits. No evidence of bowel obstruction. Prior appendectomy. Sigmoid diverticulosis, with mild pericolonic inflammatory changes in the left lower quadrant, reflecting acute diverticulitis. Associated 18 x 12 mm fluid/gas collection inferior to the sigmoid colon (series 2/image 60), suggesting a small pericolonic abscess, although this does not reflect a drainable fluid collection. No free air. Vascular/Lymphatic: No evidence of abdominal aortic aneurysm. Atherosclerotic calcifications of the abdominal aorta and branch vessels. No suspicious abdominopelvic lymphadenopathy. Reproductive: Uterus is within normal limits. Bilateral ovaries are within normal limits. Other: No abdominopelvic ascites. Musculoskeletal: Mild degenerative changes at L5-S1. IMPRESSION: Acute diverticulitis. 1.8 cm fluid/gas collection  adjacent to the sigmoid colon, suggesting a small pericolonic abscess, although this does not reflect a drainable collection. No free air. Electronically Signed   By: Julian Hy M.D.   On: 09/23/2017 20:51   Ct Abdomen Pelvis W Contrast  Result Date: 09/18/2017 CLINICAL DATA:  Left lower abdomen and suprapubic pain beginning this morning. EXAM: CT ABDOMEN AND PELVIS WITH CONTRAST TECHNIQUE: Multidetector CT imaging of the abdomen and pelvis was performed using the standard protocol following bolus administration of intravenous contrast. CONTRAST:  150mL ISOVUE-300 IOPAMIDOL (ISOVUE-300) INJECTION 61% COMPARISON:  11/30/2011 FINDINGS: Lower chest: No acute abnormality. Hepatobiliary: No focal liver abnormality is seen. No gallstones, gallbladder wall thickening, or biliary dilatation. Pancreas: Unremarkable. No pancreatic ductal dilatation or surrounding inflammatory changes. Spleen: Normal in size without focal abnormality. Adrenals/Urinary Tract: Adrenal glands are unremarkable. Kidneys are normal, without renal calculi, focal lesion, or hydronephrosis. Bladder is unremarkable. Stomach/Bowel: Stomach and small bowel unremarkable. There are numerous left colon diverticula. No diverticulitis or other colonic inflammatory process. Vascular/Lymphatic: There is prominent aortic atherosclerosis extending to the iliac vessels. No pathologically enlarged lymph nodes. Reproductive: Uterus and bilateral adnexa are unremarkable. Other: No abdominal wall hernia or abnormality. No abdominopelvic ascites. Musculoskeletal: No fracture or acute finding. No osteoblastic or osteolytic lesions. IMPRESSION: 1. No acute findings. 2. Numerous left colon diverticula. No CT evidence of diverticulitis. 3. Fairly extensive aortic atherosclerosis. Electronically Signed   By: Lajean Manes M.D.   On: 09/18/2017 20:49   Dg Chest Port 1 View  Result Date: 10/04/2017 CLINICAL DATA:  History of endotracheal tube EXAM: PORTABLE  CHEST 1 VIEW COMPARISON:  Yesterday FINDINGS: Endotracheal tube tip at the clavicular heads. An orogastric tube reaches the stomach. Right upper extremity PICC with tip at the upper SVC. Diffuse interstitial coarsening with perihilar indistinct opacity. Haziness at the right base attributed to small pleural effusion. Normal heart size. No pneumothorax. IMPRESSION: 1. Perihilar opacity likely reflecting pneumonia. No definite change from yesterday. 2. Stable hardware positioning. Electronically Signed   By: Monte Fantasia M.D.   On: 10/04/2017 07:50   Dg Chest Port 1 View  Result Date: 10/03/2017 CLINICAL DATA:  Short of breath EXAM: PORTABLE CHEST 1 VIEW COMPARISON:  10/02/2017 FINDINGS: Endotracheal tube 4 cm above the carina. Right arm PICC tip in the SVC. NG tube in the stomach Diffuse bilateral airspace disease unchanged. Small pleural effusions. No pneumothorax IMPRESSION: Support lines remain in good position. Diffuse bilateral airspace disease unchanged from yesterday Electronically Signed   By: Franchot Gallo M.D.   On: 10/03/2017 07:11   Dg Chest Port 1 View  Result Date: 10/02/2017 CLINICAL DATA:  Intubation EXAM: PORTABLE CHEST 1  VIEW COMPARISON:  Portable exam 1109 hours compared to 10/01/2017 FINDINGS: Tip of endotracheal tube projects 3.1 cm above carina. Nasogastric tube extends into stomach. RIGHT arm PICC line tip projects over SVC. Upper normal heart size. Extensive BILATERAL pulmonary airspace infiltrates unchanged. Probable tiny LEFT pleural effusion. No pneumothorax. Bones demineralized. IMPRESSION: Persistent severe diffuse BILATERAL airspace infiltrates. Electronically Signed   By: Lavonia Dana M.D.   On: 10/02/2017 11:26   Dg Chest Port 1 View  Result Date: 10/01/2017 CLINICAL DATA:  Verify endotracheal tube. EXAM: PORTABLE CHEST 1 VIEW COMPARISON:  10/01/2017 FINDINGS: Endotracheal tube is in place with tip approximately 2.9 centimeters above the carina. Nasogastric tube is in  place, tip overlying the level of the stomach and side-port in the region the gastroesophageal junction. A RIGHT-sided PICC line tip overlies the superior vena cava. Heart size is normal. There are airspace filling opacities bilaterally with central air bronchograms. There are small bilateral pleural effusions. IMPRESSION: 1. Interval placement of endotracheal tube, tip to be in good position. 2. Persistent bilateral airspace filling opacities and pleural effusions, similar to prior study. Electronically Signed   By: Nolon Nations M.D.   On: 10/01/2017 13:35   Dg Chest Port 1 View  Result Date: 10/01/2017 CLINICAL DATA:  Respiratory distress.  Follow-up. EXAM: PORTABLE CHEST 1 VIEW COMPARISON:  09/30/2017 FINDINGS: Nasogastric tube enters the abdomen. Right arm PICC tip is in the SVC above the right atrium. Widespread pulmonary opacity has worsened, consistent with progressive edema. Small effusions and dependent atelectasis. IMPRESSION: Progressive/worsened diffuse edema pattern. Electronically Signed   By: Nelson Chimes M.D.   On: 10/01/2017 11:33   Dg Chest Port 1 View  Result Date: 09/30/2017 CLINICAL DATA:  Shortness of breath EXAM: PORTABLE CHEST 1 VIEW COMPARISON:  02/20/2014 FINDINGS: An orogastric tube reaches the stomach. Right upper extremity PICC with tip at the upper SVC. Patchy bilateral airspace opacity. Streaky opacities at the bases with probable small effusions. Normal heart size for technique. No pneumothorax. IMPRESSION: 1. Patchy bilateral lung opacity primarily concerning for pneumonia. 2. There is low volumes with streaky opacity, likely superimposed atelectasis. Electronically Signed   By: Monte Fantasia M.D.   On: 09/30/2017 11:32   Dg Abd Portable 1v  Result Date: 10/05/2017 CLINICAL DATA:  Pain. Pt had abdominal surgery on 09/29/17. EXAM: PORTABLE ABDOMEN - 1 VIEW COMPARISON:  CT abdomen dated 09/26/2017. FINDINGS: Interval colostomy creation, LEFT abdomen. Overall bowel gas  pattern is nonobstructive. No evidence of soft tissue mass or abnormal fluid collections seen. No acute or suspicious osseous finding. IMPRESSION: LEFT abdominal colostomy.  Nonobstructive bowel gas pattern. Electronically Signed   By: Franki Cabot M.D.   On: 10/05/2017 21:42     LOS: 10 days   Oren Binet, MD  [facility] Pager:[phone]  If 7PM-7AM, please contact night-coverage [email or web address] Password [email or web address] 10/07/2017, 1:14 PM

## 2017-10-07 NOTE — Progress Notes (Signed)
VASCULAR LAB PRELIMINARY  ARTERIAL  ABI completed:    RIGHT    LEFT    PRESSURE WAVEFORM  PRESSURE WAVEFORM  BRACHIAL Unable to obtain Unable to obtain due to PICC line and bandaging BRACHIAL 120 Triphasic  DP 47 Monophasic DP  Unable to insonate  AT   AT    PT 54 Monophasic PT 39 Monophasic  PER   PER    GREAT TOE  Unable to identify discernable waveform GREAT TOE  Unable to identify discernable waveform    RIGHT LEFT  ABI 0.45 0.33   Bilateral ABIs are suggestive of severe arterial insufficiency at rest. Unable to identify discernable waveform in bilateral great toes.  Preliminary results discussed with Clarise Cruz, RN.  10/07/2017 5:03 PM Maudry Mayhew, BS, RVT, RDCS, RDMS

## 2017-10-07 NOTE — Progress Notes (Signed)
Physical Therapy Treatment Patient Details Name: Barbara [name] MRN: [id number] DOB: 1940-07-30 Today's Date: 10/07/2017    History of Present Illness 77 year old female with prior history of perforated sigmoid diverticulitis.  Was actually discharged on 5/6 on antibiotics and analgesia.  Over the following 24 hours developed worsening abdominal pain, nausea and vomiting.  Repeat abdomen CT demonstrated larger abdominal abscess with cavitation.  She had rising white blood cell count as well as hypotension which initially was responsive to fluid.  She also has a history of paroxysmal atrial fibrillation but not on anticoagulation due to prior GI bleeding.  She is maintained on flecainide.  The a.m. 5/9 demonstrated complex tachycardia with a rate in the 200s requiring synchronized cardioversion.  She is now on amiodarone infusion, over the morning she has had intermittent hypotension and because of her severe sepsis critical care has been asked to consult. 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  Intubated 5/11-5/14    PT Comments    Pt was seen for evaluation of today's function and note her abiltiy to stand and assist to chair is much improved. Gave her a support pillow for trunk incision and she was assisted to do LE ex in chair.  Follow acutely until transition to CIR which has been approved for pt.  Work toward gait and transfers with less assistance.   Follow Up Recommendations  CIR;Supervision/Assistance - 24 hour     Equipment Recommendations  Rolling walker with 5" wheels;3in1 (PT)    Recommendations for Other Services       Precautions / Restrictions Precautions Precautions: Fall Restrictions Weight Bearing Restrictions: No    Mobility  Bed Mobility Overal bed mobility: Needs Assistance Bed Mobility: Supine to Sit     Supine to sit: Mod assist     General bed mobility comments: bed pad to assist to side of bed with daughter verbalizing suggestions during all PT    Transfers Overall transfer level: Needs assistance Equipment used: 1 person hand held assist Transfers: Sit to/from Stand Sit to Stand: Mod assist Stand pivot transfers: Min assist       General transfer comment: power up with support R knee then pt did sidesteps to chair with PT with min assist  Ambulation/Gait             General Gait Details: unable today   Stairs             Wheelchair Mobility    Modified Rankin (Stroke Patients Only)       Balance Overall balance assessment: Needs assistance Sitting-balance support: Feet supported Sitting balance-Leahy Scale: Fair Sitting balance - Comments: touch support to encourage her efforts   Standing balance support: Bilateral upper extremity supported;During functional activity Standing balance-Leahy Scale: Fair Standing balance comment: using PT support directly on trunk and min assist to step to chair                            Cognition Arousal/Alertness: Awake/alert Behavior During Therapy: Flat affect Overall Cognitive Status: Within Functional Limits for tasks assessed                                        Exercises General Exercises - Lower Extremity Ankle Circles/Pumps: AAROM;Both;5 reps Hip ABduction/ADduction: AROM;AAROM;Both;10 reps    General Comments General comments (skin integrity, edema, etc.): wound vac in place and  draining      Pertinent Vitals/Pain Pain Assessment: Faces Faces Pain Scale: Hurts even more Pain Location: abdomen Pain Descriptors / Indicators: Aching;Grimacing;Operative site guarding Pain Intervention(s): Limited activity within patient's tolerance;Monitored during session;Repositioned    Home Living                      Prior Function            PT Goals (current goals can now be found in the care plan section) Acute Rehab PT Goals Patient Stated Goal: to get better PT Goal Formulation: With patient/family Progress  towards PT goals: Progressing toward goals    Frequency    Min 3X/week      PT Plan Current plan remains appropriate    Co-evaluation              AM-PAC PT "6 Clicks" Daily Activity  Outcome Measure  Difficulty turning over in bed (including adjusting bedclothes, sheets and blankets)?: Unable Difficulty moving from lying on back to sitting on the side of the bed? : Unable Difficulty sitting down on and standing up from a chair with arms (e.g., wheelchair, bedside commode, etc,.)?: Unable Help needed moving to and from a bed to chair (including a wheelchair)?: A Lot Help needed walking in hospital room?: A Lot Help needed climbing 3-5 steps with a railing? : Total 6 Click Score: 8    End of Session Equipment Utilized During Treatment: Gait belt;Oxygen Activity Tolerance: Patient limited by fatigue;Patient limited by pain Patient left: in chair;with call bell/phone within reach;with nursing/sitter in room;with family/visitor present Nurse Communication: Mobility status PT Visit Diagnosis: Unsteadiness on feet (R26.81);Muscle weakness (generalized) (M62.81);Pain Pain - Right/Left: (abdomen) Pain - part of body: (abdomen)     Time: 5284-1324 PT Time Calculation (min) (ACUTE ONLY): 30 min  Charges:  $Therapeutic Exercise: 8-22 mins $Therapeutic Activity: 8-22 mins                    G Codes:  Functional Assessment Tool Used: AM-PAC 6 Clicks Basic Mobility     Ramond Dial 10/07/2017, 11:43 AM   Mee Hives, PT MS Acute Rehab Dept. Number: [place] and [place]

## 2017-10-07 NOTE — Consult Note (Signed)
[place] Nurse wound follow-up consult note Reason for Consult: Surgical team following for assessment and plan of care for abd wound.   Wound type: Full thickness post-op wound to midline abd; Vac dressing change performed. Wound bed: moist red  Drainage (amount, consistency, odor)mod amt pink drainage in the cannister, no odor Periwound: intact skin surrounding Dressing procedure/placement/frequency: Applied one piece black foam to 176mm cont suction.  Pt medicated for pain prior to the procedure and tolerated with mod amt discomfort.  WOC will plan to change again Q M/W/F.  Ostomy pouch is in close proximity and will need to be changed with each Vac dressing.  Daughter at the bedside to assess wound appearance and discuss plan of care during Vac application.  [facility] Nurse ostomy follow up Stoma type/location: Colostomy stoma is red and viable, above skin level, 1 1/4 inches and slightly oval Peristomal assessment: intact skin surrounding Output: very small amt brown unformed stool Ostomy pouching: 2pc.  Education provided:  Demonstrated pouch change to patient and daughter at the bedside.  Pt did not watch the procedure or participate.  Daughter watched the process and asked appropriate questions. Reviewed obtaining supplies and pouching routines.  Supplies and educational materials left at the bedside. Enrolled patient in [facility] Start Discharge program: Yes [place] team will continue to follow for further teaching sessions while in the hospital.  She will need home health assistance when discharged with a Vac dressing. Julien Girt MSN, RN, [place], [place], [place]

## 2017-10-07 NOTE — Progress Notes (Signed)
Patient has had CBG's of 268 at 2245, 268 at [place], and 266 at 0411.  Current sliding scale orders recommend 8 units of insulin at these levels.  This nurse felt uncomfortable with giving this much insulin to this patient due to her poor PO intake and tendency to become hypoglycemic.  Paged [facility] provider K. Schorr, who ordered a one time dose of 3 units of novalog to correct the 0411 CBG of 266.  Will continue to monitor.

## 2017-10-07 NOTE — Evaluation (Signed)
Occupational Therapy Evaluation Patient Details Name: Barbara [name] MRN: [id number] DOB: [birth year] Today's Date: 10/07/2017    History of Present Illness 77 year old female with prior history of perforated sigmoid diverticulitis.  Was actually discharged on 5/6 on antibiotics and analgesia.  Over the following 24 hours developed worsening abdominal pain, nausea and vomiting.  Repeat abdomen CT demonstrated larger abdominal abscess with cavitation.  She had rising white blood cell count as well as hypotension which initially was responsive to fluid.  She also has a history of paroxysmal atrial fibrillation but not on anticoagulation due to prior GI bleeding.  She is maintained on flecainide.  The a.m. 5/9 demonstrated complex tachycardia with a rate in the 200s requiring synchronized cardioversion.  She is now on amiodarone infusion, over the morning she has had intermittent hypotension and because of her severe sepsis critical care has been asked to consult. 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  Intubated 5/11-5/14   Clinical Impression   Pt admitted with the above diagnosis and has the deficits listed below. Pt would benefit from cont OT to increase independence in basic adls and adl tranfers so she can return home with her children.  Pt is overall min-mod assist for adl transfers and max assist for LE adls although with adaptive equipment may be more independent due to abdominal pain.  Pt does very well if encouraged to do for herself and daughter may need encouragement to let pt do for herself.      Follow Up Recommendations  CIR;Supervision/Assistance - 24 hour    Equipment Recommendations  Other (comment)(tbd)    Recommendations for Other Services       Precautions / Restrictions Precautions Precautions: Fall Precaution Comments: pt with wound VAC Restrictions Weight Bearing Restrictions: No      Mobility Bed Mobility Overal bed mobility: Needs Assistance Bed  Mobility: Sit to Supine     Supine to sit: Mod assist Sit to supine: Mod assist   General bed mobility comments: Pt able to get own legs into bed.  Daughter stating she should not do this as it is hard on her abdominal muscles but did explain that helping with this task will build up strength.  Transfers Overall transfer level: Needs assistance Equipment used: 1 person hand held assist Transfers: Sit to/from [facility] Sit to Stand: Mod assist Stand pivot transfers: Min assist       General transfer comment: Pt did well once up; just needed more assist to power up.    Balance Overall balance assessment: Needs assistance Sitting-balance support: Feet supported Sitting balance-Leahy Scale: Fair Sitting balance - Comments: touch support to encourage her efforts   Standing balance support: Bilateral upper extremity supported;During functional activity Standing balance-Leahy Scale: Fair Standing balance comment: using PT support directly on trunk and min assist to step to chair                           ADL either performed or assessed with clinical judgement   ADL Overall ADL's : Needs assistance/impaired Eating/Feeding: Minimal assistance;Sitting Eating/Feeding Details (indicate cue type and reason): new diet just getting started but pt could drink from cup and use untensils [place]. Grooming: Wash/dry hands;Wash/dry face;Oral care;Set up;Sitting Grooming Details (indicate cue type and reason): encouaged daughter to allow pt to do these tasks for herself at all times. Upper Body Bathing: Minimal assistance;Sitting Upper Body Bathing Details (indicate cue type and reason): Pt very sore but with encouragement  can bathe in chair. Lower Body Bathing: Moderate assistance;Sit to/from stand;Cueing for compensatory techniques Lower Body Bathing Details (indicate cue type and reason): Pt requires assist reaching lower legs and when standing to reach  backside. Upper Body Dressing : Moderate assistance;Sitting   Lower Body Dressing: Moderate assistance;Sit to/from stand;Cueing for compensatory techniques Lower Body Dressing Details (indicate cue type and reason): Pt may benefit from AE to start to reach lower legs due to pain.   Toilet Transfer: Minimal assistance;BSC;RW   Toileting- Water quality scientist and Hygiene: Moderate assistance;Sit to/from stand Toileting - Clothing Manipulation Details (indicate cue type and reason): Colostomy bag emptied by nursing at this point. Pt ready to take this task over for herself.     Functional mobility during ADLs: Minimal assistance;+2 for safety/equipment General ADL Comments: Pt is capeable of moving foward with doing more adls. Pt is most limited by pain in her abdomen.  Feel she may do more if daughter is not in room.     Vision Baseline Vision/History: No visual deficits Patient Visual Report: No change from baseline Vision Assessment?: No apparent visual deficits     Perception     Praxis      Pertinent Vitals/Pain Pain Assessment: Faces Faces Pain Scale: Hurts little more Pain Location: abdomen Pain Descriptors / Indicators: Aching;Grimacing;Operative site guarding Pain Intervention(s): Limited activity within patient's tolerance;Repositioned     Hand Dominance Right   Extremity/Trunk Assessment Upper Extremity Assessment Upper Extremity Assessment: Generalized weakness   Lower Extremity Assessment Lower Extremity Assessment: Defer to PT evaluation   Cervical / Trunk Assessment Cervical / Trunk Assessment: Normal   Communication Communication Communication: No difficulties   Cognition Arousal/Alertness: Awake/alert Behavior During Therapy: WFL for tasks assessed/performed Overall Cognitive Status: Within Functional Limits for tasks assessed                                 General Comments: Pt is able to do quite a bit.  Did constantly encouarge  daughter to let pt do for herself.  Daughter quick to do everything for pt including drink from a cup.   General Comments  Pt with swelling in R hand.  Both arms elevated on pillows.    Exercises General Exercises - Lower Extremity Ankle Circles/Pumps: AAROM;Both;5 reps Hip ABduction/ADduction: AROM;AAROM;Both;10 reps   Shoulder Instructions      Home Living Family/patient expects to be discharged to:: Private residence Living Arrangements: Children Available Help at Discharge: Family;Available 24 hours/day Type of Home: House Home Access: Stairs to enter [employer] of Steps: 5 Entrance Stairs-Rails: Left Home Layout: One level     Bathroom Shower/Tub: Teacher, early years/pre: Standard     Home Equipment: Toilet riser   Additional Comments: daughter states pt can come to her home if needed and then will have 1 step and a walk in shower.      Prior Functioning/Environment Level of Independence: Independent        Comments: drove, did grocery shopping, did not walk long distances due to compressed spine with back pain.         OT Problem List: Decreased strength;Decreased activity tolerance;Impaired balance (sitting and/or standing);Decreased knowledge of use of DME or AE;Pain      OT Treatment/Interventions: Self-care/ADL training;DME and/or AE instruction;Therapeutic activities;Balance training    OT Goals(Current goals can be found in the care plan section) Acute Rehab OT Goals Patient Stated Goal: to get better OT  Goal Formulation: With patient/family Time For Goal Achievement: 10/21/17 Potential to Achieve Goals: Good ADL Goals Pt Will Perform Grooming: with supervision;standing Pt Will Perform Lower Body Bathing: with supervision;sit to/from stand Pt Will Perform Lower Body Dressing: with supervision;sit to/from stand;with adaptive equipment Pt Will Transfer to Toilet: with min guard assist;ambulating Pt Will Perform Toileting -  Clothing Manipulation and hygiene: with min assist;sit to/from stand  OT Frequency: Min 3X/week   Barriers to D/C:            Co-evaluation              AM-PAC PT "6 Clicks" Daily Activity     Outcome Measure Help from another person eating meals?: A Little Help from another person taking care of personal grooming?: A Little Help from another person toileting, which includes using toliet, bedpan, or urinal?: A Lot Help from another person bathing (including washing, rinsing, drying)?: A Lot Help from another person to put on and taking off regular upper body clothing?: A Lot Help from another person to put on and taking off regular lower body clothing?: A Lot 6 Click Score: 14   End of Session Equipment Utilized During Treatment: Oxygen Nurse Communication: Mobility status  Activity Tolerance: Patient limited by fatigue Patient left: in bed;with call bell/phone within reach;with family/visitor present  OT Visit Diagnosis: Unsteadiness on feet (R26.81);Pain Pain - part of body: (abdomen)                Time: 5053-9767 OT Time Calculation (min): 25 min Charges:  OT General Charges $OT Visit: 1 Visit OT Evaluation $OT Eval Moderate Complexity: 1 Mod OT Treatments $Self Care/Home Management : 8-22 mins G-Codes:     Jinger Neighbors, OTR/L [phone]  Glenford Peers 10/07/2017, 12:34 PM

## 2017-10-07 NOTE — Progress Notes (Signed)
CSW spoke with patient and family in the room with consent. Patient and daughter expressed frustration that CSW was "already" talking to patient about discharge plan and stated that patient was far from discharge. CSW explained that we like to provide options as early as possible so there is time to consider each option. Daughter stated that she and her sister could care for patient if patient does not go to CIR. They declined SNF list. CSW available for questions.   Barbara Locus Chesley Valls LCSW [phone]

## 2017-10-07 NOTE — Progress Notes (Signed)
Inpatient Diabetes Program Recommendations  AACE/ADA: New Consensus Statement on Inpatient Glycemic Control (2015)  Target Ranges:  Prepandial:   less than 140 mg/dL      Peak postprandial:   less than 180 mg/dL (1-2 hours)      Critically ill patients:  140 - 180 mg/dL   Results for SUZZETTE, GASPARRO (MRN [id number]) as of 10/07/2017 09:47  Ref. Range 10/06/2017 07:56 10/06/2017 11:56 10/06/2017 16:04 10/06/2017 22:45 10/07/2017 00:23 10/07/2017 04:11 10/07/2017 05:37 10/07/2017 09:13  Glucose-Capillary Latest Ref Range: 65 - 99 mg/dL 147 (H) 136 (H) 174 (H) 268 (H) 268 (H) 266 (H) 244 (H) 247 (H)   Review of Glycemic Control  Inpatient Diabetes Program Recommendations:   Hypoglycemia thought to be because of basal insulin dose. Patient received a reduced dose last night of 6 units. Glucose consistently in the mid 200 range. RN felt uncomfortable giving full dose of Correction scale. Due to decrease in basal insulin RN please give full dose listed on Correction scale.   Thanks,  Tama Headings RN, MSN, BC-ADM, [facility] Inpatient Diabetes Coordinator Team Pager [phone] (8a-5p)

## 2017-10-07 NOTE — Progress Notes (Addendum)
Patient ID: Barbara [name], female   DOB: 11/13/1940, 77 y.o.   MRN: [id number]    8 Days Post-Op  Subjective: Pt has some abdominal pain as they just changed her VAC.  Otherwise is having bowel function and eating some with no nausea.  Objective: Vital signs in last 24 hours: Temp:  [98 F (36.7 C)-98.8 F (37.1 C)] 98 F (36.7 C) (05/17 0416) Pulse Rate:  [73-81] 73 (05/17 0416) Resp:  [17-19] 17 (05/17 0416) BP: (103-104)/(40-49) 104/40 (05/17 0416) SpO2:  [91 %-98 %] 98 % (05/17 0416) Weight:  [69.4 kg (153 lb)] 69.4 kg (153 lb) (05/17 0412) Last BM Date: 10/04/17  Intake/Output from previous day: 05/16 0701 - 05/17 0700 In: 612.5 [P.O.:100; I.V.:212.5; IV Piggyback:300] Out: 1150 [Urine:1150] Intake/Output this shift: Total I/O In: 280 [P.O.:280] Out: 300 [Urine:300]  PE: Soft, wound VAC just changed , wound is clean per WOC, RN.  Ostomy bag just changed as well, but some stool coming from stoma currently.  +BS  Lab Results:  Recent Labs    10/06/17 0427 10/07/17 0408  WBC 13.8* 15.7*  HGB 9.3* 9.5*  HCT 28.0* 30.0*  PLT 298 331   BMET Recent Labs    10/06/17 0427 10/07/17 0408  NA 137 134*  K 4.4 5.2*  CL 96* 95*  CO2 33* 26  GLUCOSE 62* 284*  BUN 18 22*  CREATININE 0.84 1.18*  CALCIUM 7.9* 7.9*   PT/INR No results for input(s): LABPROT, INR in the last 72 hours. CMP     Component Value Date/Time   NA 134 (L) 10/07/2017 0408   K 5.2 (H) 10/07/2017 0408   CL 95 (L) 10/07/2017 0408   CO2 26 10/07/2017 0408   GLUCOSE 284 (H) 10/07/2017 0408   BUN 22 (H) 10/07/2017 0408   CREATININE 1.18 (H) 10/07/2017 0408   CREATININE 0.97 (H) 07/28/2015 1334   CALCIUM 7.9 (L) 10/07/2017 0408   PROT 4.4 (L) 10/04/2017 0545   ALBUMIN 1.3 (L) 10/04/2017 0545   AST 61 (H) 10/04/2017 0545   ALT 26 10/04/2017 0545   ALKPHOS 75 10/04/2017 0545   BILITOT 0.3 10/04/2017 0545   GFRNONAA 44 (L) 10/07/2017 0408   GFRAA 51 (L) 10/07/2017 0408   Lipase       Component Value Date/Time   LIPASE 19 10/01/2017 1135       Studies/Results: Ct Abdomen Pelvis W Contrast  Result Date: 10/06/2017 CLINICAL DATA:  History of diverticulitis with perforation and abscess formation, recent colectomy with Hartmann's pouch creation EXAM: CT ABDOMEN AND PELVIS WITH CONTRAST TECHNIQUE: Multidetector CT imaging of the abdomen and pelvis was performed using the standard protocol following bolus administration of intravenous contrast. CONTRAST:  168mL OMNIPAQUE IOHEXOL 300 MG/ML  SOLN COMPARISON:  09/26/2017 FINDINGS: Lower chest: Bibasilar atelectatic changes with small effusions are noted new from the prior exam. Hepatobiliary: Mild fatty infiltration of the liver is noted. Gallbladder is within normal limits. Pancreas: Unremarkable. No pancreatic ductal dilatation or surrounding inflammatory changes. Spleen: Normal in size without focal abnormality. Adrenals/Urinary Tract: Adrenal glands are within normal limits. 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 No obstructive changes are seen. No renal calculi are noted. No focal mass is seen. The bladder is partially decompressed by Foley catheter. Air is noted within the bladder related to the prior instrumentation. Stomach/Bowel: Postoperative changes are now seen with a Hartmann's pouch in the pelvis. Left mid abdominal ostomy is noted. The appendix is not visualized consistent with a  prior surgical history. No obstructive changes are noted. Mild small bowel edema is noted likely related to the recent surgery. Vascular/Lymphatic: Aortic atherosclerosis. No enlarged abdominal or pelvic lymph nodes. Reproductive: Uterus and bilateral adnexa are unremarkable. Other: Small amount of free fluid is noted within the pelvis likely related to the recent surgery. No findings to suggest focal abscess are noted. Musculoskeletal: Degenerative changes of lumbar spine are noted. No acute bony abnormality is seen.  Edematous changes are noted in the subcutaneous tissues of the abdominal wall and pelvis. IMPRESSION: Postoperative changes consistent with the given clinical history. No recurrent abscess is identified. Mild free fluid is noted likely postoperative in nature. Mild small bowel edema which may be related to the patient's underlying abdominal pain. No obstructive changes are noted. This is likely related to the recent surgery. Bilateral pleural effusions and bibasilar atelectatic changes new from the prior exam. No other focal abnormality is noted at this time. Electronically Signed   By: Inez Catalina M.D.   On: 10/06/2017 18:16   Dg Abd Portable 1v  Result Date: 10/05/2017 CLINICAL DATA:  Pain. Pt had abdominal surgery on 09/29/17. EXAM: PORTABLE ABDOMEN - 1 VIEW COMPARISON:  CT abdomen dated 09/26/2017. FINDINGS: Interval colostomy creation, LEFT abdomen. Overall bowel gas pattern is nonobstructive. No evidence of soft tissue mass or abnormal fluid collections seen. No acute or suspicious osseous finding. IMPRESSION: LEFT abdominal colostomy.  Nonobstructive bowel gas pattern. Electronically Signed   By: Franki Cabot M.D.   On: 10/05/2017 21:42    Anti-infectives: Anti-infectives (From admission, onward)   Start     Dose/Rate Route Frequency Ordered Stop   10/02/17 1800  meropenem (MERREM) 1 g in sodium chloride 0.9 % 100 mL IVPB     1 g 200 mL/hr over 30 Minutes Intravenous Every 8 hours 10/02/17 1043     09/27/17 1200  meropenem (MERREM) 1 g in sodium chloride 0.9 % 100 mL IVPB  Status:  Discontinued     1 g 200 mL/hr over 30 Minutes Intravenous Every 12 hours 09/27/17 1032 10/02/17 1043   09/27/17 1000  ciprofloxacin (CIPRO) IVPB 400 mg  Status:  Discontinued     400 mg 200 mL/hr over 60 Minutes Intravenous Every 12 hours 09/27/17 0547 09/27/17 1029   09/27/17 0800  metroNIDAZOLE (FLAGYL) IVPB 500 mg  Status:  Discontinued     500 mg 100 mL/hr over 60 Minutes Intravenous Every 8 hours 09/27/17  0547 09/27/17 1030   09/26/17 2315  ciprofloxacin (CIPRO) IVPB 400 mg     400 mg 200 mL/hr over 60 Minutes Intravenous  Once 09/26/17 2304 09/27/17 0032   09/26/17 2315  metroNIDAZOLE (FLAGYL) IVPB 500 mg     500 mg 100 mL/hr over 60 Minutes Intravenous  Once 09/26/17 2304 09/27/17 0229       Assessment/Plan Septic shock secondary to perforated diverticulitis -POD8, s/p ex lap with Left/sigmoid colectomy with transverse end colostomy, 09-29-17 by Dr. Redmond Pulling -advance to soft diet and add ensure -path shows diverticulitis -cont wound VAC. -stable for DC to CIR if bed available, will follow wound if goes to CIR.  SVT, s/p cardioversion  AKI -resolved.   Hypotension Improved  Respiratory Failure, VDRF/Tobacco abuse Extubated and doin well  CVA HTN hypoerlipidemia DM Chronic back pain  FEN - NPO/NGT,if ext could DC NGT and give clears, if remains on vent could trickle TFs VTE - SCDs/heparin ID - Merrem 5/7 --> will discuss with MD how long this is needed.  LOS: 10 days    Henreitta Cea , [facility] 10/07/2017, 11:04 AM Pager: [phone]

## 2017-10-08 LAB — BASIC METABOLIC PANEL
Anion gap: 10 (ref 5–15)
BUN: 18 mg/dL (ref 6–20)
CO2: 29 mmol/L (ref 22–32)
Calcium: 7.8 mg/dL — ABNORMAL LOW (ref 8.9–10.3)
Chloride: 92 mmol/L — ABNORMAL LOW (ref 101–111)
Creatinine, Ser: 1.07 mg/dL — ABNORMAL HIGH (ref 0.44–1.00)
GFR calc Af Amer: 58 mL/min — ABNORMAL LOW (ref >60)
GFR calc non Af Amer: 50 mL/min — ABNORMAL LOW (ref >60)
Glucose, Bld: 247 mg/dL — ABNORMAL HIGH (ref 65–99)
Potassium: 4.3 mmol/L (ref 3.5–5.1)
Sodium: 131 mmol/L — ABNORMAL LOW (ref 135–145)

## 2017-10-08 LAB — GLUCOSE, CAPILLARY
GLUCOSE-CAPILLARY: 237 mg/dL — AB (ref 65–99)
Glucose-Capillary: 197 mg/dL — ABNORMAL HIGH (ref 65–99)
Glucose-Capillary: 167 mg/dL — ABNORMAL HIGH (ref 65–99)
Glucose-Capillary: 109 mg/dL — ABNORMAL HIGH (ref 65–99)

## 2017-10-08 MED ORDER — OXYCODONE HCL 5 MG PO TABS
5.0000 mg | ORAL_TABLET | ORAL | Status: DC | PRN
  Administered 2017-10-08 – 2017-10-10 (×8): 10 mg via ORAL
  Filled 2017-10-08 (×10): qty 2

## 2017-10-08 MED ORDER — HYDROMORPHONE HCL 1 MG/ML IJ SOLN
0.5000 mg | INTRAMUSCULAR | Status: DC | PRN
  Administered 2017-10-08 – 2017-10-10 (×6): 0.5 mg via INTRAVENOUS
  Filled 2017-10-08 (×7): qty 0.5

## 2017-10-08 MED ORDER — METHOCARBAMOL 500 MG PO TABS: 500.0000 mg | ORAL_TABLET | Freq: Three times a day (TID) | ORAL | Status: DC | PRN

## 2017-10-08 MED ORDER — SIMETHICONE 80 MG PO CHEW: 80.0000 mg | CHEWABLE_TABLET | Freq: Four times a day (QID) | ORAL | Status: DC | PRN

## 2017-10-08 MED ORDER — ASPIRIN 81 MG PO CHEW
81.0000 mg | CHEWABLE_TABLET | Freq: Every day | ORAL | Status: DC
  Administered 2017-10-08 – 2017-10-10 (×3): 81 mg via ORAL
  Filled 2017-10-08 (×3): qty 1

## 2017-10-08 NOTE — Progress Notes (Signed)
[facility] Progress Note  9 Days Post-Op  Subjective: CC-  Daughter at bedside. Patient drowsy because she just took some pain medication. Continues to have intermittent crampy abdominal pain. Denies bloating, n/v. Good amount of flatus from ostomy. Working well with therapies, recommending CIR.  Objective: Vital signs in last 24 hours: Temp:  [98.8 F (37.1 C)-98.9 F (37.2 C)] 98.9 F (37.2 C) (05/18 0420) Pulse Rate:  [75-78] 77 (05/18 0918) Resp:  [17] 17 (05/18 0420) BP: (102-128)/(39-47) 128/47 (05/18 0918) SpO2:  [92 %-94 %] 94 % (05/18 0420) Weight:  [67.1 kg (147 lb 14.4 oz)] 67.1 kg (147 lb 14.4 oz) (05/18 0948) Last BM Date: 10/07/17(ostomy)  Intake/Output from previous day: 05/17 0701 - 05/18 0700 In: 300 [P.O.:280; I.V.:20] Out: 525 [Urine:525] Intake/Output this shift: Total I/O In: -  Out: 200 [Urine:200]  PE: Gen:  Alert but drowsy, NAD, pleasant HEENT: EOM's intact, pupils equal and round Card:  RRR, no M/G/R heard Pulm:  effort normal Abd: Soft, ND, mild tenderness around vac, +BS, midline wound vac in place, ostomy pink with air in bag Skin: warm and dry  Lab Results:  Recent Labs    10/06/17 0427 10/07/17 0408  WBC 13.8* 15.7*  HGB 9.3* 9.5*  HCT 28.0* 30.0*  PLT 298 331   BMET Recent Labs    10/07/17 0408 10/08/17 0825  NA 134* 131*  K 5.2* 4.3  CL 95* 92*  CO2 26 29  GLUCOSE 284* 247*  BUN 22* 18  CREATININE 1.18* 1.07*  CALCIUM 7.9* 7.8*   PT/INR No results for input(s): LABPROT, INR in the last 72 hours. CMP     Component Value Date/Time   NA 131 (L) 10/08/2017 0825   K 4.3 10/08/2017 0825   CL 92 (L) 10/08/2017 0825   CO2 29 10/08/2017 0825   GLUCOSE 247 (H) 10/08/2017 0825   BUN 18 10/08/2017 0825   CREATININE 1.07 (H) 10/08/2017 0825   CREATININE 0.97 (H) 07/28/2015 1334   CALCIUM 7.8 (L) 10/08/2017 0825   PROT 4.4 (L) 10/04/2017 0545   ALBUMIN 1.3 (L) 10/04/2017 0545   AST 61 (H) 10/04/2017 0545    ALT 26 10/04/2017 0545   ALKPHOS 75 10/04/2017 0545   BILITOT 0.3 10/04/2017 0545   GFRNONAA 50 (L) 10/08/2017 0825   GFRAA 58 (L) 10/08/2017 0825   Lipase     Component Value Date/Time   LIPASE 19 10/01/2017 1135       Studies/Results: Ct Abdomen Pelvis W Contrast  Result Date: 10/06/2017 CLINICAL DATA:  History of diverticulitis with perforation and abscess formation, recent colectomy with Hartmann's pouch creation EXAM: CT ABDOMEN AND PELVIS WITH CONTRAST TECHNIQUE: Multidetector CT imaging of the abdomen and pelvis was performed using the standard protocol following bolus administration of intravenous contrast. CONTRAST:  112mL OMNIPAQUE IOHEXOL 300 MG/ML  SOLN COMPARISON:  09/26/2017 FINDINGS: Lower chest: Bibasilar atelectatic changes with small effusions are noted new from the prior exam. Hepatobiliary: Mild fatty infiltration of the liver is noted. Gallbladder is within normal limits. Pancreas: Unremarkable. No pancreatic ductal dilatation or surrounding inflammatory changes. Spleen: Normal in size without focal abnormality. Adrenals/Urinary Tract: Adrenal glands are within normal limits. 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 No obstructive changes are seen. No renal calculi are noted. No focal mass is seen. The bladder is partially decompressed by Foley catheter. Air is noted within the bladder related to the prior instrumentation. Stomach/Bowel: Postoperative changes are now seen with a Hartmann's  pouch in the pelvis. Left mid abdominal ostomy is noted. The appendix is not visualized consistent with a prior surgical history. No obstructive changes are noted. Mild small bowel edema is noted likely related to the recent surgery. Vascular/Lymphatic: Aortic atherosclerosis. No enlarged abdominal or pelvic lymph nodes. Reproductive: Uterus and bilateral adnexa are unremarkable. Other: Small amount of free fluid is noted within the pelvis likely related to the  recent surgery. No findings to suggest focal abscess are noted. Musculoskeletal: Degenerative changes of lumbar spine are noted. No acute bony abnormality is seen. Edematous changes are noted in the subcutaneous tissues of the abdominal wall and pelvis. IMPRESSION: Postoperative changes consistent with the given clinical history. No recurrent abscess is identified. Mild free fluid is noted likely postoperative in nature. Mild small bowel edema which may be related to the patient's underlying abdominal pain. No obstructive changes are noted. This is likely related to the recent surgery. Bilateral pleural effusions and bibasilar atelectatic changes new from the prior exam. No other focal abnormality is noted at this time. Electronically Signed   By: Inez Catalina M.D.   On: 10/06/2017 18:16    Anti-infectives: Anti-infectives (From admission, onward)   Start     Dose/Rate Route Frequency Ordered Stop   10/02/17 1800  meropenem (MERREM) 1 g in sodium chloride 0.9 % 100 mL IVPB  Status:  Discontinued     1 g 200 mL/hr over 30 Minutes Intravenous Every 8 hours 10/02/17 1043 10/07/17 1311   09/27/17 1200  meropenem (MERREM) 1 g in sodium chloride 0.9 % 100 mL IVPB  Status:  Discontinued     1 g 200 mL/hr over 30 Minutes Intravenous Every 12 hours 09/27/17 1032 10/02/17 1043   09/27/17 1000  ciprofloxacin (CIPRO) IVPB 400 mg  Status:  Discontinued     400 mg 200 mL/hr over 60 Minutes Intravenous Every 12 hours 09/27/17 0547 09/27/17 1029   09/27/17 0800  metroNIDAZOLE (FLAGYL) IVPB 500 mg  Status:  Discontinued     500 mg 100 mL/hr over 60 Minutes Intravenous Every 8 hours 09/27/17 0547 09/27/17 1030   09/26/17 2315  ciprofloxacin (CIPRO) IVPB 400 mg     400 mg 200 mL/hr over 60 Minutes Intravenous  Once 09/26/17 2304 09/27/17 0032   09/26/17 2315  metroNIDAZOLE (FLAGYL) IVPB 500 mg     500 mg 100 mL/hr over 60 Minutes Intravenous  Once 09/26/17 2304 09/27/17 0229       Assessment/Plan Septic  shock secondary to perforated diverticulitis 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 Redmond Pulling -POD9 -path shows diverticulitis -cont wound VAC changes MWF -No evidence of abscess on CT 5/16  SVT, s/p cardioversion  AKI -improving, Cr 1.07.   Hypotension Improved  Respiratory Failure, VDRF/Tobacco abuse Extubated and doing well  CVA HTN hypoerlipidemia DM Chronic back pain  FEN - soft diet, Ensure VTE - SCDs/heparin ID - Merrem 5/7 >> 5/17  Plan: Stable for DC to CIR if bed available, will follow wound if goes to CIR. Continue wound vac. Encourage PO intake and mobilization. Add simethicone for gas pains.   LOS: 11 days    Wellington Hampshire , [facility] 10/08/2017, 11:38 AM Pager: [phone] Consults: [phone] Mon-Fri 7:00 am-4:30 pm Sat-Sun 7:00 am-11:30 am

## 2017-10-08 NOTE — Progress Notes (Signed)
PROGRESS NOTE        PATIENT DETAILS Name: Barbara [name] Age: 77 y.o. Sex: female Date of Birth: 1941-03-13 Admit Date: 09/26/2017 Admitting Physician Oswald Hillock, MD [id number], Carloyn Manner, MD  Brief Narrative: Patient is a 77 y.o. female with history of DM-2, hypertension, recently discharged from any pain hospital on 5/5 after being treated for sigmoid diverticulitis with abscess-presented to the hospital on 5/6 with worsening abdominal pain, and to have worsening diverticular perforation-plans were to undergo a partial colectomy/colostomy-but had a episode of unstable wide-complex tachycardia with hypotension requiring synchronized cardioversion, patient was subsequently transferred to [facility] underwent a colectomy with colostomy placement on 5/9, postoperatively patient developed worsening hypoxic respiratory failure requiring intubation.  She was managed in the intensive care unit, and subsequently transferred to the hospitalist service on 5/16.  Subjective: Abdominal pain has improved-complains of some tingling and numbness in the left upper thigh.  Assessment/Plan: Acute hypoxic respiratory failure: Occurred in the postoperative setting-requiring intubation-extubated on 5/14.  Encourage incentive spirometry-currently stable on oxygen via nasal cannula 3- 4 L a minute.  Titrate off oxygen as tolerated  Severe sepsis in the setting of perforated diverticulitis with abscess: Sepsis pathophysiology has resolved, underwent exploratory laparotomy with colectomy and colostomy placement on 5/9.  Repeat CT scan of the abdomen on 5/16 did not show any residual abscess.  Meropenem discontinued on 5/17.  Acute metabolic encephalopathy: Resolved-reviewed ICU notes-patient has had significant amounts of delirium and postoperative pain.  Minimize narcotics/sedatives as much as possible.   Left thigh pain: Markedly improved-has more of a neuropathic  component-continues to complain of some minimal burning sensation.  Exam continues to be benign without any erythema or swelling.  Continue supportive care.    Peripheral arterial disease: ABI on 5/17-done due to left lower extremity pain-slight decrease in temperature of the toes compared to the contralateral toes-showed severe disease on the left-moderate disease on the right.  Case was discussed with Dr. Leeroy Cha reviewed ABI results-and thought that this was chronic and incidental.  Patient has adequate motor and sensory function-per Dr. Starr Sinclair long as she is intact-no emergent indication for intervention at this time.  Will need outpatient follow-up with [facility].  Aspirin.  Symptomatic wide-complex tachycardia: occurred on 5/8 requiring emergent synchronized cardioversion with 200 J.  Was briefly maintained on amiodarone evaluated by electrophysiology-thought to have SVT with aberrancy.  Acute kidney injury: Improved-suspect this was hemodynamically mediated in the setting of sepsis.  Mild hyperkalemia: Resolved  Diabetic ketoacidosis: Resolved  Anemia: Likely secondary to acute illness-follow for now.  Leukocytosis: Doubt active infection given negative CT scan abdomen on 5/16.  Supportive care-continue to follow off antimicrobial therapy.  Leukocytosis could be due to ongoing inflammation in the setting of a wound VAC/laparotomy.  DM-2: Did have an episode of hypoglycemia on 5/16-Lantus has been decreased to 6 units-continues to be on SSI.  Morning sugars are now on the higher side-watch for another day before adjusting any further.   History of paroxysmal atrial fibrillation/SVT: Review of prior notes indicate patient not on anticoagulation due to prior history of bleeding-continue metoprolol.   Twelve-lead EKG done this morning shows sinus rhythm-we will discuss with cardiology/EP over the phone to see if we can resume her flecainide.  DVT Prophylaxis: Prophylactic Heparin   Code  Status: Full code  Family Communication: Daughter at bedside  Disposition  Plan: Remain inpatient-hopefully CIR when bed available  Antimicrobial agents: Anti-infectives (From admission, onward)   Start     Dose/Rate Route Frequency Ordered Stop   10/02/17 1800  meropenem (MERREM) 1 g in sodium chloride 0.9 % 100 mL IVPB  Status:  Discontinued     1 g 200 mL/hr over 30 Minutes Intravenous Every 8 hours 10/02/17 1043 10/07/17 1311   09/27/17 1200  meropenem (MERREM) 1 g in sodium chloride 0.9 % 100 mL IVPB  Status:  Discontinued     1 g 200 mL/hr over 30 Minutes Intravenous Every 12 hours 09/27/17 1032 10/02/17 1043   09/27/17 1000  ciprofloxacin (CIPRO) IVPB 400 mg  Status:  Discontinued     400 mg 200 mL/hr over 60 Minutes Intravenous Every 12 hours 09/27/17 0547 09/27/17 1029   09/27/17 0800  metroNIDAZOLE (FLAGYL) IVPB 500 mg  Status:  Discontinued     500 mg 100 mL/hr over 60 Minutes Intravenous Every 8 hours 09/27/17 0547 09/27/17 1030   09/26/17 2315  ciprofloxacin (CIPRO) IVPB 400 mg     400 mg 200 mL/hr over 60 Minutes Intravenous  Once 09/26/17 2304 09/27/17 0032   09/26/17 2315  metroNIDAZOLE (FLAGYL) IVPB 500 mg     500 mg 100 mL/hr over 60 Minutes Intravenous  Once 09/26/17 2304 09/27/17 0229      Procedures: 5/9>>EXPLORATORY LAPAROTOMY (N/A)LEFT & SIGMOID COLECTOMY WITH COLOSTOMY CREATION (HARTMANN PROCEDURE) (Left) 5/11>>ETT  CONSULTS:  cardiology, pulmonary/intensive care and general surgery  Time spent: 25-minutes-Greater than 50% of this time was spent in counseling, explanation of diagnosis, planning of further management, and coordination of care.  MEDICATIONS: Scheduled Meds: . brimonidine  1 drop Both Eyes TID  . chlorhexidine  15 mL Mouth Rinse BID  . Chlorhexidine Gluconate Cloth  6 each Topical Daily  . feeding supplement (ENSURE ENLIVE)  237 mL Oral BID BM  . heparin injection (subcutaneous)  5,000 Units Subcutaneous Q8H  . insulin aspart  0-9  Units Subcutaneous TID WC  . insulin glargine  6 Units Subcutaneous QHS  . mouth rinse  15 mL Mouth Rinse q12n4p  . metoprolol tartrate  12.5 mg Oral BID  . pantoprazole  40 mg Oral QHS   Continuous Infusions: . dextrose 5 % and 0.45% NaCl Stopped (10/07/17 0207)   PRN Meds:.acetaminophen, HYDROmorphone (DILAUDID) injection, methocarbamol, ondansetron **OR** ondansetron (ZOFRAN) IV, oxyCODONE, simethicone, sodium chloride flush, white petrolatum   PHYSICAL EXAM: Vital signs: Vitals:   10/07/17 2126 10/08/17 0420 10/08/17 0918 10/08/17 0948  BP: (!) 109/41 (!) 117/41 (!) 128/47   Pulse: 76 75 77   Resp: 17 17    Temp: 98.8 F (37.1 C) 98.9 F (37.2 C)    TempSrc:      SpO2: 94% 94%    Weight:    67.1 kg (147 lb 14.4 oz)  Height:       Filed Weights   10/05/17 0441 10/07/17 0412 10/08/17 0948  Weight: 67.5 kg (148 lb 13 oz) 69.4 kg (153 lb) 67.1 kg (147 lb 14.4 oz)   Body mass index is 26.2 kg/m.   General appearance:Awake, alert, not in any distress.  Eyes:no scleral icterus. HEENT: Atraumatic and Normocephalic Neck: supple, no JVD. Resp:Good air entry bilaterally,no rales or rhonchi CVS: S1 S2 regular, no murmurs.  GI: Bowel sounds present, wound VAC in place-soft and nontender.  Ostomy present Extremities: B/L Lower Ext shows no edema, both legs are warm to touch Neurology:  Non focal Psychiatric: Normal judgment and  insight. Normal mood. Musculoskeletal:No digital cyanosis Skin:No Rash, warm and dry Wounds:N/A  I have personally reviewed following labs and imaging studies  LABORATORY DATA: CBC: Recent Labs  Lab 10/02/17 0425 10/03/17 0601 10/04/17 0440 10/05/17 0437 10/06/17 0427 10/07/17 0408  WBC 13.7* 12.9* 10.8* 13.4* 13.8* 15.7*  NEUTROABS 11.4* 9.7* 7.5 9.2* 9.8*  --   HGB 10.3* 9.4* 9.3* 9.8* 9.3* 9.5*  HCT 30.7* 28.2* 28.3* 28.9* 28.0* 30.0*  MCV 90.6 87.9 91.3 86.0 87.8 90.4  PLT 285 284 260 275 298 379    Basic Metabolic Panel: Recent  Labs  Lab 10/02/17 0425  10/03/17 0601 10/04/17 0545 10/05/17 0437 10/06/17 0427 10/07/17 0408 10/08/17 0825  NA 145   < > 141 139 136 137 134* 131*  K 3.5   < > 3.7 3.1* 3.4* 4.4 5.2* 4.3  CL 107   < > 100* 93* 90* 96* 95* 92*  CO2 31   < > 34* 37* 34* 33* 26 29  GLUCOSE 154*   < > 140* 208* 127* 62* 284* 247*  BUN 11   < > 13 16 17 18  22* 18  CREATININE 0.76   < > 0.75 0.81 0.80 0.84 1.18* 1.07*  CALCIUM 7.6*   < > 7.4* 7.5* 7.6* 7.9* 7.9* 7.8*  MG 1.8  --  1.9 1.8 2.1 2.0  --   --   PHOS 1.4*  --  2.4* 2.9 3.5 2.5  --   --    < > = values in this interval not displayed.    GFR: Estimated Creatinine Clearance: 42.5 mL/min (A) (by C-G formula based on SCr of 1.07 mg/dL (H)).  Liver Function Tests: Recent Labs  Lab 10/02/17 0425 10/03/17 0601 10/04/17 0545  AST 15 35 61*  ALT 12* 15 26  ALKPHOS 65 67 75  BILITOT 0.5 0.4 0.3  PROT 4.3* 4.4* 4.4*  ALBUMIN 1.5* 1.3* 1.3*   No results for input(s): LIPASE, AMYLASE in the last 168 hours. No results for input(s): AMMONIA in the last 168 hours.  Coagulation Profile: No results for input(s): INR, PROTIME in the last 168 hours.  Cardiac Enzymes: Recent Labs  Lab 10/02/17 0425  TROPONINI 0.25*    BNP (last 3 results) No results for input(s): PROBNP in the last 8760 hours.  HbA1C: No results for input(s): HGBA1C in the last 72 hours.  CBG: Recent Labs  Lab 10/07/17 0913 10/07/17 1204 10/07/17 1706 10/07/17 2127 10/08/17 0735  GLUCAP 247* 199* 172* 198* 237*    Lipid Profile: No results for input(s): CHOL, HDL, LDLCALC, TRIG, CHOLHDL, LDLDIRECT in the last 72 hours.  Thyroid Function Tests: No results for input(s): TSH, T4TOTAL, FREET4, T3FREE, THYROIDAB in the last 72 hours.  Anemia Panel: No results for input(s): VITAMINB12, FOLATE, FERRITIN, TIBC, IRON, RETICCTPCT in the last 72 hours.  Urine analysis:    Component Value Date/Time   COLORURINE YELLOW 09/26/2017 2231   APPEARANCEUR CLEAR  09/26/2017 2231   LABSPEC 1.014 09/26/2017 2231   PHURINE 6.0 09/26/2017 2231   GLUCOSEU NEGATIVE 09/26/2017 2231   HGBUR SMALL (A) 09/26/2017 2231   BILIRUBINUR NEGATIVE 09/26/2017 2231   KETONESUR 20 (A) 09/26/2017 2231   PROTEINUR NEGATIVE 09/26/2017 2231   UROBILINOGEN 0.2 07/06/2009 1116   NITRITE NEGATIVE 09/26/2017 2231   LEUKOCYTESUR TRACE (A) 09/26/2017 2231    Sepsis Labs: Lactic Acid, Venous    Component Value Date/Time   LATICACIDVEN 1.4 10/01/2017 1135    MICROBIOLOGY: No results found for this  or any previous visit (from the past 240 hour(s)).  RADIOLOGY STUDIES/RESULTS: Ct Abdomen Pelvis W Contrast  Result Date: 10/06/2017 CLINICAL DATA:  History of diverticulitis with perforation and abscess formation, recent colectomy with Hartmann's pouch creation EXAM: CT ABDOMEN AND PELVIS WITH CONTRAST TECHNIQUE: Multidetector CT imaging of the abdomen and pelvis was performed using the standard protocol following bolus administration of intravenous contrast. CONTRAST:  160mL OMNIPAQUE IOHEXOL 300 MG/ML  SOLN COMPARISON:  09/26/2017 FINDINGS: Lower chest: Bibasilar atelectatic changes with small effusions are noted new from the prior exam. Hepatobiliary: Mild fatty infiltration of the liver is noted. Gallbladder is within normal limits. Pancreas: Unremarkable. No pancreatic ductal dilatation or surrounding inflammatory changes. Spleen: Normal in size without focal abnormality. Adrenals/Urinary Tract: Adrenal glands are within normal limits. 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 No obstructive changes are seen. No renal calculi are noted. No focal mass is seen. The bladder is partially decompressed by Foley catheter. Air is noted within the bladder related to the prior instrumentation. Stomach/Bowel: Postoperative changes are now seen with a Hartmann's pouch in the pelvis. Left mid abdominal ostomy is noted. The appendix is not visualized consistent with a  prior surgical history. No obstructive changes are noted. Mild small bowel edema is noted likely related to the recent surgery. Vascular/Lymphatic: Aortic atherosclerosis. No enlarged abdominal or pelvic lymph nodes. Reproductive: Uterus and bilateral adnexa are unremarkable. Other: Small amount of free fluid is noted within the pelvis likely related to the recent surgery. No findings to suggest focal abscess are noted. Musculoskeletal: Degenerative changes of lumbar spine are noted. No acute bony abnormality is seen. Edematous changes are noted in the subcutaneous tissues of the abdominal wall and pelvis. IMPRESSION: Postoperative changes consistent with the given clinical history. No recurrent abscess is identified. Mild free fluid is noted likely postoperative in nature. Mild small bowel edema which may be related to the patient's underlying abdominal pain. No obstructive changes are noted. This is likely related to the recent surgery. Bilateral pleural effusions and bibasilar atelectatic changes new from the prior exam. No other focal abnormality is noted at this time. Electronically Signed   By: Inez Catalina M.D.   On: 10/06/2017 18:16   Ct Abdomen Pelvis W Contrast  Result Date: 09/27/2017 CLINICAL DATA:  77 y/o  F; lower abdominal pain. EXAM: CT ABDOMEN AND PELVIS WITH CONTRAST TECHNIQUE: Multidetector CT imaging of the abdomen and pelvis was performed using the standard protocol following bolus administration of intravenous contrast. CONTRAST:  168mL ISOVUE-300 IOPAMIDOL (ISOVUE-300) INJECTION 61% COMPARISON:  09/23/2017 CT abdomen and pelvis FINDINGS: Lower chest: No acute abnormality. Hepatobiliary: No focal liver abnormality is seen. No gallstones, gallbladder wall thickening, or biliary dilatation. Pancreas: Unremarkable. No pancreatic ductal dilatation or surrounding inflammatory changes. Spleen: Normal in size without focal abnormality. Adrenals/Urinary Tract: Adrenal glands are unremarkable.  Kidneys are normal, without renal calculi, focal lesion, or hydronephrosis. Bladder is unremarkable. Stomach/Bowel: Acute perforated diverticulitis (series 5, image 49) with oblong fluid collection extending between the bladder and the uterus measuring 7.1 cm in length and 1.8 cm in diameter (series 2, image 67 and series 5, image 45), increased in size in comparison with the prior CT of the abdomen and pelvis. Contrast and debris is present within the perforated collection. Stomach and small bowel are unremarkable. Vascular/Lymphatic: Aortic atherosclerosis. No enlarged abdominal or pelvic lymph nodes. Reproductive: Uterus and bilateral adnexa are unremarkable. Other: No abdominal wall hernia or abnormality. No abdominopelvic ascites. Musculoskeletal: No acute or  significant osseous findings. IMPRESSION: Acute perforated diverticulitis. Pericolonic abscess extending between the bladder and uterus has increased in size and surrounding inflammation. Contrast and debris extends into the abscess cavity. Electronically Signed   By: Kristine Garbe M.D.   On: 09/27/2017 00:34   Ct Abdomen Pelvis W Contrast  Result Date: 09/23/2017 CLINICAL DATA:  Abdominal pain, diverticulitis suspected EXAM: CT ABDOMEN AND PELVIS WITH CONTRAST TECHNIQUE: Multidetector CT imaging of the abdomen and pelvis was performed using the standard protocol following bolus administration of intravenous contrast. CONTRAST:  179mL ISOVUE-300 IOPAMIDOL (ISOVUE-300) INJECTION 61% COMPARISON:  CT abdomen/pelvis dated 09/10/2017. FINDINGS: Lower chest: Lung bases are clear. Hepatobiliary: Liver is within normal limits. Gallbladder is unremarkable. No intrahepatic or extrahepatic ductal dilatation. Pancreas: Within normal limits. Spleen: Within normal limits. Adrenals/Urinary Tract: Adrenal glands are within normal limits. Kidneys within normal limits.  No hydronephrosis. Mildly thick-walled bladder. Stomach/Bowel: Stomach is within normal  limits. No evidence of bowel obstruction. Prior appendectomy. Sigmoid diverticulosis, with mild pericolonic inflammatory changes in the left lower quadrant, reflecting acute diverticulitis. Associated 18 x 12 mm fluid/gas collection inferior to the sigmoid colon (series 2/image 60), suggesting a small pericolonic abscess, although this does not reflect a drainable fluid collection. No free air. Vascular/Lymphatic: No evidence of abdominal aortic aneurysm. Atherosclerotic calcifications of the abdominal aorta and branch vessels. No suspicious abdominopelvic lymphadenopathy. Reproductive: Uterus is within normal limits. Bilateral ovaries are within normal limits. Other: No abdominopelvic ascites. Musculoskeletal: Mild degenerative changes at L5-S1. IMPRESSION: Acute diverticulitis. 1.8 cm fluid/gas collection adjacent to the sigmoid colon, suggesting a small pericolonic abscess, although this does not reflect a drainable collection. No free air. Electronically Signed   By: Julian Hy M.D.   On: 09/23/2017 20:51   Ct Abdomen Pelvis W Contrast  Result Date: 09/18/2017 CLINICAL DATA:  Left lower abdomen and suprapubic pain beginning this morning. EXAM: CT ABDOMEN AND PELVIS WITH CONTRAST TECHNIQUE: Multidetector CT imaging of the abdomen and pelvis was performed using the standard protocol following bolus administration of intravenous contrast. CONTRAST:  175mL ISOVUE-300 IOPAMIDOL (ISOVUE-300) INJECTION 61% COMPARISON:  11/30/2011 FINDINGS: Lower chest: No acute abnormality. Hepatobiliary: No focal liver abnormality is seen. No gallstones, gallbladder wall thickening, or biliary dilatation. Pancreas: Unremarkable. No pancreatic ductal dilatation or surrounding inflammatory changes. Spleen: Normal in size without focal abnormality. Adrenals/Urinary Tract: Adrenal glands are unremarkable. Kidneys are normal, without renal calculi, focal lesion, or hydronephrosis. Bladder is unremarkable. Stomach/Bowel: Stomach  and small bowel unremarkable. There are numerous left colon diverticula. No diverticulitis or other colonic inflammatory process. Vascular/Lymphatic: There is prominent aortic atherosclerosis extending to the iliac vessels. No pathologically enlarged lymph nodes. Reproductive: Uterus and bilateral adnexa are unremarkable. Other: No abdominal wall hernia or abnormality. No abdominopelvic ascites. Musculoskeletal: No fracture or acute finding. No osteoblastic or osteolytic lesions. IMPRESSION: 1. No acute findings. 2. Numerous left colon diverticula. No CT evidence of diverticulitis. 3. Fairly extensive aortic atherosclerosis. Electronically Signed   By: Lajean Manes M.D.   On: 09/18/2017 20:49   Dg Chest Port 1 View  Result Date: 10/04/2017 CLINICAL DATA:  History of endotracheal tube EXAM: PORTABLE CHEST 1 VIEW COMPARISON:  Yesterday FINDINGS: Endotracheal tube tip at the clavicular heads. An orogastric tube reaches the stomach. Right upper extremity PICC with tip at the upper SVC. Diffuse interstitial coarsening with perihilar indistinct opacity. Haziness at the right base attributed to small pleural effusion. Normal heart size. No pneumothorax. IMPRESSION: 1. Perihilar opacity likely reflecting pneumonia. No definite change from yesterday. 2. Stable  hardware positioning. Electronically Signed   By: Monte Fantasia M.D.   On: 10/04/2017 07:50   Dg Chest Port 1 View  Result Date: 10/03/2017 CLINICAL DATA:  Short of breath EXAM: PORTABLE CHEST 1 VIEW COMPARISON:  10/02/2017 FINDINGS: Endotracheal tube 4 cm above the carina. Right arm PICC tip in the SVC. NG tube in the stomach Diffuse bilateral airspace disease unchanged. Small pleural effusions. No pneumothorax IMPRESSION: Support lines remain in good position. Diffuse bilateral airspace disease unchanged from yesterday Electronically Signed   By: Franchot Gallo M.D.   On: 10/03/2017 07:11   Dg Chest Port 1 View  Result Date: 10/02/2017 CLINICAL DATA:   Intubation EXAM: PORTABLE CHEST 1 VIEW COMPARISON:  Portable exam 1109 hours compared to 10/01/2017 FINDINGS: Tip of endotracheal tube projects 3.1 cm above carina. Nasogastric tube extends into stomach. RIGHT arm PICC line tip projects over SVC. Upper normal heart size. Extensive BILATERAL pulmonary airspace infiltrates unchanged. Probable tiny LEFT pleural effusion. No pneumothorax. Bones demineralized. IMPRESSION: Persistent severe diffuse BILATERAL airspace infiltrates. Electronically Signed   By: Lavonia Dana M.D.   On: 10/02/2017 11:26   Dg Chest Port 1 View  Result Date: 10/01/2017 CLINICAL DATA:  Verify endotracheal tube. EXAM: PORTABLE CHEST 1 VIEW COMPARISON:  10/01/2017 FINDINGS: Endotracheal tube is in place with tip approximately 2.9 centimeters above the carina. Nasogastric tube is in place, tip overlying the level of the stomach and side-port in the region the gastroesophageal junction. A RIGHT-sided PICC line tip overlies the superior vena cava. Heart size is normal. There are airspace filling opacities bilaterally with central air bronchograms. There are small bilateral pleural effusions. IMPRESSION: 1. Interval placement of endotracheal tube, tip to be in good position. 2. Persistent bilateral airspace filling opacities and pleural effusions, similar to prior study. Electronically Signed   By: Nolon Nations M.D.   On: 10/01/2017 13:35   Dg Chest Port 1 View  Result Date: 10/01/2017 CLINICAL DATA:  Respiratory distress.  Follow-up. EXAM: PORTABLE CHEST 1 VIEW COMPARISON:  09/30/2017 FINDINGS: Nasogastric tube enters the abdomen. Right arm PICC tip is in the SVC above the right atrium. Widespread pulmonary opacity has worsened, consistent with progressive edema. Small effusions and dependent atelectasis. IMPRESSION: Progressive/worsened diffuse edema pattern. Electronically Signed   By: Nelson Chimes M.D.   On: 10/01/2017 11:33   Dg Chest Port 1 View  Result Date: 09/30/2017 CLINICAL  DATA:  Shortness of breath EXAM: PORTABLE CHEST 1 VIEW COMPARISON:  02/20/2014 FINDINGS: An orogastric tube reaches the stomach. Right upper extremity PICC with tip at the upper SVC. Patchy bilateral airspace opacity. Streaky opacities at the bases with probable small effusions. Normal heart size for technique. No pneumothorax. IMPRESSION: 1. Patchy bilateral lung opacity primarily concerning for pneumonia. 2. There is low volumes with streaky opacity, likely superimposed atelectasis. Electronically Signed   By: Monte Fantasia M.D.   On: 09/30/2017 11:32   Dg Abd Portable 1v  Result Date: 10/05/2017 CLINICAL DATA:  Pain. Pt had abdominal surgery on 09/29/17. EXAM: PORTABLE ABDOMEN - 1 VIEW COMPARISON:  CT abdomen dated 09/26/2017. FINDINGS: Interval colostomy creation, LEFT abdomen. Overall bowel gas pattern is nonobstructive. No evidence of soft tissue mass or abnormal fluid collections seen. No acute or suspicious osseous finding. IMPRESSION: LEFT abdominal colostomy.  Nonobstructive bowel gas pattern. Electronically Signed   By: Franki Cabot M.D.   On: 10/05/2017 21:42     LOS: 11 days   Oren Binet, MD  [facility] Pager:[phone]  If 7PM-7AM,  please contact night-coverage [email or web address] Password [facility] 10/08/2017, 12:14 PM

## 2017-10-09 DIAGNOSIS — D72829 Elevated white blood cell count, unspecified: Secondary | ICD-10-CM

## 2017-10-09 LAB — GLUCOSE, CAPILLARY
GLUCOSE-CAPILLARY: 99 mg/dL (ref 65–99)
GLUCOSE-CAPILLARY: 150 mg/dL — AB (ref 65–99)
Glucose-Capillary: 133 mg/dL — ABNORMAL HIGH (ref 65–99)
Glucose-Capillary: 130 mg/dL — ABNORMAL HIGH (ref 65–99)
Glucose-Capillary: 120 mg/dL — ABNORMAL HIGH (ref 65–99)
Glucose-Capillary: 136 mg/dL — ABNORMAL HIGH (ref 65–99)

## 2017-10-09 MED ORDER — FLECAINIDE ACETATE 100 MG PO TABS
100.0000 mg | ORAL_TABLET | Freq: Two times a day (BID) | ORAL | Status: DC
  Administered 2017-10-09 – 2017-10-10 (×3): 100 mg via ORAL
  Filled 2017-10-09 (×5): qty 1

## 2017-10-09 NOTE — Progress Notes (Signed)
10 Days Post-Op   Subjective/Chief Complaint: Still not taking a lot po Denies pain this morning   Objective: Vital signs in last 24 hours: Temp:  [98.5 F (36.9 C)-99 F (37.2 C)] 98.5 F (36.9 C) (05/19 0524) Pulse Rate:  [73-96] 76 (05/19 0901) Resp:  [16] 16 (05/18 2050) BP: (103-133)/(43-60) 133/43 (05/19 0901) SpO2:  [95 %-100 %] 95 % (05/19 0524) Weight:  [66.5 kg (146 lb 9.7 oz)-67.1 kg (147 lb 14.4 oz)] 66.5 kg (146 lb 9.7 oz) (05/19 0500) Last BM Date: 10/07/17  Intake/Output from previous day: 05/18 0701 - 05/19 0700 In: 120 [I.V.:120] Out: 400 [Urine:400] Intake/Output this shift: No intake/output data recorded.  Exam: Looks a little better Abdomen soft, VAC in place, some gas in bag  Lab Results:  Recent Labs    10/07/17 0408  WBC 15.7*  HGB 9.5*  HCT 30.0*  PLT 331   BMET Recent Labs    10/07/17 0408 10/08/17 0825  NA 134* 131*  K 5.2* 4.3  CL 95* 92*  CO2 26 29  GLUCOSE 284* 247*  BUN 22* 18  CREATININE 1.18* 1.07*  CALCIUM 7.9* 7.8*   PT/INR No results for input(s): LABPROT, INR in the last 72 hours. ABG No results for input(s): PHART, HCO3 in the last 72 hours.  Invalid input(s): PCO2, PO2  Studies/Results: No results found.  Anti-infectives: Anti-infectives (From admission, onward)   Start     Dose/Rate Route Frequency Ordered Stop   10/02/17 1800  meropenem (MERREM) 1 g in sodium chloride 0.9 % 100 mL IVPB  Status:  Discontinued     1 g 200 mL/hr over 30 Minutes Intravenous Every 8 hours 10/02/17 1043 10/07/17 1311   09/27/17 1200  meropenem (MERREM) 1 g in sodium chloride 0.9 % 100 mL IVPB  Status:  Discontinued     1 g 200 mL/hr over 30 Minutes Intravenous Every 12 hours 09/27/17 1032 10/02/17 1043   09/27/17 1000  ciprofloxacin (CIPRO) IVPB 400 mg  Status:  Discontinued     400 mg 200 mL/hr over 60 Minutes Intravenous Every 12 hours 09/27/17 0547 09/27/17 1029   09/27/17 0800  metroNIDAZOLE (FLAGYL) IVPB 500 mg  Status:   Discontinued     500 mg 100 mL/hr over 60 Minutes Intravenous Every 8 hours 09/27/17 0547 09/27/17 1030   09/26/17 2315  ciprofloxacin (CIPRO) IVPB 400 mg     400 mg 200 mL/hr over 60 Minutes Intravenous  Once 09/26/17 2304 09/27/17 0032   09/26/17 2315  metroNIDAZOLE (FLAGYL) IVPB 500 mg     500 mg 100 mL/hr over 60 Minutes Intravenous  Once 09/26/17 2304 09/27/17 0229      Assessment/Plan: s/p Procedure(s): EXPLORATORY LAPAROTOMY (N/A) EXTENDED LEFT HEMI COLECTOMY WITH COLOSTOMY CREATION (HARTMANN PROCEDURE) (Left)  Needs a lot of encouragement Out of bed Agree with need for CIR  LOS: 12 days    Barbara [name] A 10/09/2017

## 2017-10-09 NOTE — Progress Notes (Signed)
PROGRESS NOTE        PATIENT DETAILS Name: Barbara [name] Age: 77 y.o. Sex: female Date of Birth: 10/06/1940 Admit Date: 09/26/2017 Admitting Physician Oswald Hillock, MD KDX:[id number], Carloyn Manner, MD  Brief Narrative: Patient is a 77 y.o. female with history of DM-2, hypertension, recently discharged from any pain hospital on 5/5 after being treated for sigmoid diverticulitis with abscess-presented to the hospital on 5/6 with worsening abdominal pain, and to have worsening diverticular perforation-plans were to undergo a partial colectomy/colostomy-but had a episode of unstable wide-complex tachycardia with hypotension requiring synchronized cardioversion, patient was subsequently transferred to [facility] underwent a colectomy with colostomy placement on 5/9, postoperatively patient developed worsening hypoxic respiratory failure requiring intubation.  She was managed in the intensive care unit, and subsequently transferred to the hospitalist service on 5/16.  Subjective: Abdominal pain is stable this morning.  Had some difficulty urinating last night but now voiding spontaneously.  Assessment/Plan: Acute hypoxic respiratory failure: Occurred in the postoperative setting-requiring intubation-extubated on 5/14.  Encourage incentive spirometry-currently stable on oxygen via nasal cannula 3- 4 L a minute.  Titrate off oxygen as tolerated  Severe sepsis in the setting of perforated diverticulitis with abscess: Sepsis pathophysiology has resolved, underwent exploratory laparotomy with colectomy and colostomy placement on 5/9.  Since repeat CT scan of the abdomen on 5/16 did not show any abscess-all antimicrobial therapy has since been discontinued.  She remains stable off antimicrobial therapy.    Acute metabolic encephalopathy: Resolved-reviewed ICU notes-patient has had significant amounts of delirium and postoperative pain.  Minimize narcotics/sedatives as much as  possible.   Left thigh pain: Improved-appears to be mostly neuropathic (burning sensation)-exam continues to be benign without any erythema or swelling.  Continue supportive care.    Peripheral arterial disease: ABI on 5/17-done due to left lower extremity pain-slight decrease in temperature of the toes compared to the contralateral toes-ABI showed severe disease bilaterally but more on the left.  Patient does not have any motor or sensory symptoms.  Case was discussed with Dr. Donnetta Hutching, advises supportive care for now-we will need follow-up in the outpatient setting.  Continue aspirin.  Symptomatic wide-complex tachycardia: occurred on 5/8 requiring emergent synchronized cardioversion with 200 J.  Was briefly maintained on amiodarone evaluated by electrophysiology-thought to have SVT with aberrancy.  Prior to this hospitalization patient was maintained on flecainide-Case was discussed with Dr. Devona Konig knows this patient well, he reviewed the chart and recommended that we restart flecainide.  Acute kidney injury: Resolved, suspect this was secondary to hemodynamically mediated kidney injury in the setting of sepsis  Mild hyperkalemia: Resolved  Diabetic ketoacidosis: Resolved  Anemia: Converted to acute illness-hemoglobin stable-follow periodically.  Leukocytosis: Doubt active infection given negative CT abdomen on 5/16 -no longer on antimicrobial therapy-continue to follow CBC periodically.   DM-2: Did have an episode of hypoglycemia on 5/16-following which Lantus dosing was adjusted to 6 units-remains and SSI-CBG stable.  Follow adjust accordingly  History of paroxysmal atrial fibrillation/SVT: Reviewed of prior notes indicate patient not on anticoagulation due to prior history of bleeding-continue metoprolol.  As noted above-Case was discussed with patient's primary electrophysiologist Dr. Ilda Foil flecainide.  Deconditioning: Secondary to acute illness/prolonged hospitalization-out  of bed to chair-encouraged patient to participate in rehab activities-hopefully CIR on discharge when bed available  DVT Prophylaxis: Prophylactic Heparin   Code Status: Full code  Family  Communication: Daughter at bedside  Disposition Plan: Remain inpatient-hopefully CIR when bed available  Antimicrobial agents: Anti-infectives (From admission, onward)   Start     Dose/Rate Route Frequency Ordered Stop   10/02/17 1800  meropenem (MERREM) 1 g in sodium chloride 0.9 % 100 mL IVPB  Status:  Discontinued     1 g 200 mL/hr over 30 Minutes Intravenous Every 8 hours 10/02/17 1043 10/07/17 1311   09/27/17 1200  meropenem (MERREM) 1 g in sodium chloride 0.9 % 100 mL IVPB  Status:  Discontinued     1 g 200 mL/hr over 30 Minutes Intravenous Every 12 hours 09/27/17 1032 10/02/17 1043   09/27/17 1000  ciprofloxacin (CIPRO) IVPB 400 mg  Status:  Discontinued     400 mg 200 mL/hr over 60 Minutes Intravenous Every 12 hours 09/27/17 0547 09/27/17 1029   09/27/17 0800  metroNIDAZOLE (FLAGYL) IVPB 500 mg  Status:  Discontinued     500 mg 100 mL/hr over 60 Minutes Intravenous Every 8 hours 09/27/17 0547 09/27/17 1030   09/26/17 2315  ciprofloxacin (CIPRO) IVPB 400 mg     400 mg 200 mL/hr over 60 Minutes Intravenous  Once 09/26/17 2304 09/27/17 0032   09/26/17 2315  metroNIDAZOLE (FLAGYL) IVPB 500 mg     500 mg 100 mL/hr over 60 Minutes Intravenous  Once 09/26/17 2304 09/27/17 0229      Procedures: 5/9>>EXPLORATORY LAPAROTOMY (N/A)LEFT & SIGMOID COLECTOMY WITH COLOSTOMY CREATION (HARTMANN PROCEDURE) (Left) 5/11>>ETT  CONSULTS:  cardiology, pulmonary/intensive care and general surgery  Time spent: 25-minutes-Greater than 50% of this time was spent in counseling, explanation of diagnosis, planning of further management, and coordination of care.  MEDICATIONS: Scheduled Meds: . aspirin  81 mg Oral Daily  . brimonidine  1 drop Both Eyes TID  . chlorhexidine  15 mL Mouth Rinse BID  .  Chlorhexidine Gluconate Cloth  6 each Topical Daily  . feeding supplement (ENSURE ENLIVE)  237 mL Oral BID BM  . flecainide  100 mg Oral BID  . heparin injection (subcutaneous)  5,000 Units Subcutaneous Q8H  . insulin aspart  0-9 Units Subcutaneous TID WC  . insulin glargine  6 Units Subcutaneous QHS  . mouth rinse  15 mL Mouth Rinse q12n4p  . metoprolol tartrate  12.5 mg Oral BID  . pantoprazole  40 mg Oral QHS   Continuous Infusions: . dextrose 5 % and 0.45% NaCl 10 mL/hr at 10/09/17 0901   PRN Meds:.acetaminophen, HYDROmorphone (DILAUDID) injection, methocarbamol, ondansetron **OR** ondansetron (ZOFRAN) IV, oxyCODONE, simethicone, sodium chloride flush, white petrolatum   PHYSICAL EXAM: Vital signs: Vitals:   10/08/17 2050 10/09/17 0500 10/09/17 0524 10/09/17 0901  BP: (!) 125/43  103/60 (!) 133/43  Pulse: 73  96 76  Resp: 16     Temp: 99 F (37.2 C)  98.5 F (36.9 C)   TempSrc: Oral  Oral   SpO2: 100%  95%   Weight:  66.5 kg (146 lb 9.7 oz)    Height:       Filed Weights   10/07/17 0412 10/08/17 0948 10/09/17 0500  Weight: 69.4 kg (153 lb) 67.1 kg (147 lb 14.4 oz) 66.5 kg (146 lb 9.7 oz)   Body mass index is 25.97 kg/m.   General appearance:Awake, alert, not in any distress.  Eyes:no scleral icterus. HEENT: Atraumatic and Normocephalic Neck: supple, no JVD. Resp:Good air entry bilaterally,no rales or rhonchi CVS: S1 S2 regular, no murmurs.  GI: Bowel sounds present, mildly tender-wound VAC in place. Extremities: B/L  Lower Ext shows no edema, both legs are warm to touch Neurology:  Non focal Psychiatric: Normal judgment and insight. Normal mood. Musculoskeletal:No digital cyanosis Skin:No Rash, warm and dry Wounds:N/A  I have personally reviewed following labs and imaging studies  LABORATORY DATA: CBC: Recent Labs  Lab 10/03/17 0601 10/04/17 0440 10/05/17 0437 10/06/17 0427 10/07/17 0408  WBC 12.9* 10.8* 13.4* 13.8* 15.7*  NEUTROABS 9.7* 7.5 9.2*  9.8*  --   HGB 9.4* 9.3* 9.8* 9.3* 9.5*  HCT 28.2* 28.3* 28.9* 28.0* 30.0*  MCV 87.9 91.3 86.0 87.8 90.4  PLT 284 260 275 298 093    Basic Metabolic Panel: Recent Labs  Lab 10/03/17 0601 10/04/17 0545 10/05/17 0437 10/06/17 0427 10/07/17 0408 10/08/17 0825  NA 141 139 136 137 134* 131*  K 3.7 3.1* 3.4* 4.4 5.2* 4.3  CL 100* 93* 90* 96* 95* 92*  CO2 34* 37* 34* 33* 26 29  GLUCOSE 140* 208* 127* 62* 284* 247*  BUN 13 16 17 18  22* 18  CREATININE 0.75 0.81 0.80 0.84 1.18* 1.07*  CALCIUM 7.4* 7.5* 7.6* 7.9* 7.9* 7.8*  MG 1.9 1.8 2.1 2.0  --   --   PHOS 2.4* 2.9 3.5 2.5  --   --     GFR: Estimated Creatinine Clearance: 42.2 mL/min (A) (by C-G formula based on SCr of 1.07 mg/dL (H)).  Liver Function Tests: Recent Labs  Lab 10/03/17 0601 10/04/17 0545  AST 35 61*  ALT 15 26  ALKPHOS 67 75  BILITOT 0.4 0.3  PROT 4.4* 4.4*  ALBUMIN 1.3* 1.3*   No results for input(s): LIPASE, AMYLASE in the last 168 hours. No results for input(s): AMMONIA in the last 168 hours.  Coagulation Profile: No results for input(s): INR, PROTIME in the last 168 hours.  Cardiac Enzymes: No results for input(s): CKTOTAL, CKMB, CKMBINDEX, TROPONINI in the last 168 hours.  BNP (last 3 results) No results for input(s): PROBNP in the last 8760 hours.  HbA1C: No results for input(s): HGBA1C in the last 72 hours.  CBG: Recent Labs  Lab 10/08/17 1719 10/08/17 2051 10/09/17 0419 10/09/17 0746 10/09/17 1227  GLUCAP 167* 109* 133* 99 130*    Lipid Profile: No results for input(s): CHOL, HDL, LDLCALC, TRIG, CHOLHDL, LDLDIRECT in the last 72 hours.  Thyroid Function Tests: No results for input(s): TSH, T4TOTAL, FREET4, T3FREE, THYROIDAB in the last 72 hours.  Anemia Panel: No results for input(s): VITAMINB12, FOLATE, FERRITIN, TIBC, IRON, RETICCTPCT in the last 72 hours.  Urine analysis:    Component Value Date/Time   COLORURINE YELLOW 09/26/2017 2231   APPEARANCEUR CLEAR 09/26/2017  2231   LABSPEC 1.014 09/26/2017 2231   PHURINE 6.0 09/26/2017 2231   GLUCOSEU NEGATIVE 09/26/2017 2231   HGBUR SMALL (A) 09/26/2017 2231   BILIRUBINUR NEGATIVE 09/26/2017 2231   KETONESUR 20 (A) 09/26/2017 2231   PROTEINUR NEGATIVE 09/26/2017 2231   UROBILINOGEN 0.2 07/06/2009 1116   NITRITE NEGATIVE 09/26/2017 2231   LEUKOCYTESUR TRACE (A) 09/26/2017 2231    Sepsis Labs: Lactic Acid, Venous    Component Value Date/Time   LATICACIDVEN 1.4 10/01/2017 1135    MICROBIOLOGY: No results found for this or any previous visit (from the past 240 hour(s)).  RADIOLOGY STUDIES/RESULTS: Ct Abdomen Pelvis W Contrast  Result Date: 10/06/2017 CLINICAL DATA:  History of diverticulitis with perforation and abscess formation, recent colectomy with Hartmann's pouch creation EXAM: CT ABDOMEN AND PELVIS WITH CONTRAST TECHNIQUE: Multidetector CT imaging of the abdomen and pelvis was performed using  the standard protocol following bolus administration of intravenous contrast. CONTRAST:  113mL OMNIPAQUE IOHEXOL 300 MG/ML  SOLN COMPARISON:  09/26/2017 FINDINGS: Lower chest: Bibasilar atelectatic changes with small effusions are noted new from the prior exam. Hepatobiliary: Mild fatty infiltration of the liver is noted. Gallbladder is within normal limits. Pancreas: Unremarkable. No pancreatic ductal dilatation or surrounding inflammatory changes. Spleen: Normal in size without focal abnormality. Adrenals/Urinary Tract: Adrenal glands are within normal limits. 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 No obstructive changes are seen. No renal calculi are noted. No focal mass is seen. The bladder is partially decompressed by Foley catheter. Air is noted within the bladder related to the prior instrumentation. Stomach/Bowel: Postoperative changes are now seen with a Hartmann's pouch in the pelvis. Left mid abdominal ostomy is noted. The appendix is not visualized consistent with a prior  surgical history. No obstructive changes are noted. Mild small bowel edema is noted likely related to the recent surgery. Vascular/Lymphatic: Aortic atherosclerosis. No enlarged abdominal or pelvic lymph nodes. Reproductive: Uterus and bilateral adnexa are unremarkable. Other: Small amount of free fluid is noted within the pelvis likely related to the recent surgery. No findings to suggest focal abscess are noted. Musculoskeletal: Degenerative changes of lumbar spine are noted. No acute bony abnormality is seen. Edematous changes are noted in the subcutaneous tissues of the abdominal wall and pelvis. IMPRESSION: Postoperative changes consistent with the given clinical history. No recurrent abscess is identified. Mild free fluid is noted likely postoperative in nature. Mild small bowel edema which may be related to the patient's underlying abdominal pain. No obstructive changes are noted. This is likely related to the recent surgery. Bilateral pleural effusions and bibasilar atelectatic changes new from the prior exam. No other focal abnormality is noted at this time. Electronically Signed   By: Inez Catalina M.D.   On: 10/06/2017 18:16   Ct Abdomen Pelvis W Contrast  Result Date: 09/27/2017 CLINICAL DATA:  77 y/o  F; lower abdominal pain. EXAM: CT ABDOMEN AND PELVIS WITH CONTRAST TECHNIQUE: Multidetector CT imaging of the abdomen and pelvis was performed using the standard protocol following bolus administration of intravenous contrast. CONTRAST:  173mL ISOVUE-300 IOPAMIDOL (ISOVUE-300) INJECTION 61% COMPARISON:  09/23/2017 CT abdomen and pelvis FINDINGS: Lower chest: No acute abnormality. Hepatobiliary: No focal liver abnormality is seen. No gallstones, gallbladder wall thickening, or biliary dilatation. Pancreas: Unremarkable. No pancreatic ductal dilatation or surrounding inflammatory changes. Spleen: Normal in size without focal abnormality. Adrenals/Urinary Tract: Adrenal glands are unremarkable. Kidneys are  normal, without renal calculi, focal lesion, or hydronephrosis. Bladder is unremarkable. Stomach/Bowel: Acute perforated diverticulitis (series 5, image 49) with oblong fluid collection extending between the bladder and the uterus measuring 7.1 cm in length and 1.8 cm in diameter (series 2, image 67 and series 5, image 45), increased in size in comparison with the prior CT of the abdomen and pelvis. Contrast and debris is present within the perforated collection. Stomach and small bowel are unremarkable. Vascular/Lymphatic: Aortic atherosclerosis. No enlarged abdominal or pelvic lymph nodes. Reproductive: Uterus and bilateral adnexa are unremarkable. Other: No abdominal wall hernia or abnormality. No abdominopelvic ascites. Musculoskeletal: No acute or significant osseous findings. IMPRESSION: Acute perforated diverticulitis. Pericolonic abscess extending between the bladder and uterus has increased in size and surrounding inflammation. Contrast and debris extends into the abscess cavity. Electronically Signed   By: Kristine Garbe M.D.   On: 09/27/2017 00:34   Ct Abdomen Pelvis W Contrast  Result Date: 09/23/2017 CLINICAL DATA:  Abdominal pain, diverticulitis suspected EXAM: CT ABDOMEN AND PELVIS WITH CONTRAST TECHNIQUE: Multidetector CT imaging of the abdomen and pelvis was performed using the standard protocol following bolus administration of intravenous contrast. CONTRAST:  135mL ISOVUE-300 IOPAMIDOL (ISOVUE-300) INJECTION 61% COMPARISON:  CT abdomen/pelvis dated 09/10/2017. FINDINGS: Lower chest: Lung bases are clear. Hepatobiliary: Liver is within normal limits. Gallbladder is unremarkable. No intrahepatic or extrahepatic ductal dilatation. Pancreas: Within normal limits. Spleen: Within normal limits. Adrenals/Urinary Tract: Adrenal glands are within normal limits. Kidneys within normal limits.  No hydronephrosis. Mildly thick-walled bladder. Stomach/Bowel: Stomach is within normal limits. No  evidence of bowel obstruction. Prior appendectomy. Sigmoid diverticulosis, with mild pericolonic inflammatory changes in the left lower quadrant, reflecting acute diverticulitis. Associated 18 x 12 mm fluid/gas collection inferior to the sigmoid colon (series 2/image 60), suggesting a small pericolonic abscess, although this does not reflect a drainable fluid collection. No free air. Vascular/Lymphatic: No evidence of abdominal aortic aneurysm. Atherosclerotic calcifications of the abdominal aorta and branch vessels. No suspicious abdominopelvic lymphadenopathy. Reproductive: Uterus is within normal limits. Bilateral ovaries are within normal limits. Other: No abdominopelvic ascites. Musculoskeletal: Mild degenerative changes at L5-S1. IMPRESSION: Acute diverticulitis. 1.8 cm fluid/gas collection adjacent to the sigmoid colon, suggesting a small pericolonic abscess, although this does not reflect a drainable collection. No free air. Electronically Signed   By: Julian Hy M.D.   On: 09/23/2017 20:51   Ct Abdomen Pelvis W Contrast  Result Date: 09/18/2017 CLINICAL DATA:  Left lower abdomen and suprapubic pain beginning this morning. EXAM: CT ABDOMEN AND PELVIS WITH CONTRAST TECHNIQUE: Multidetector CT imaging of the abdomen and pelvis was performed using the standard protocol following bolus administration of intravenous contrast. CONTRAST:  128mL ISOVUE-300 IOPAMIDOL (ISOVUE-300) INJECTION 61% COMPARISON:  11/30/2011 FINDINGS: Lower chest: No acute abnormality. Hepatobiliary: No focal liver abnormality is seen. No gallstones, gallbladder wall thickening, or biliary dilatation. Pancreas: Unremarkable. No pancreatic ductal dilatation or surrounding inflammatory changes. Spleen: Normal in size without focal abnormality. Adrenals/Urinary Tract: Adrenal glands are unremarkable. Kidneys are normal, without renal calculi, focal lesion, or hydronephrosis. Bladder is unremarkable. Stomach/Bowel: Stomach and small  bowel unremarkable. There are numerous left colon diverticula. No diverticulitis or other colonic inflammatory process. Vascular/Lymphatic: There is prominent aortic atherosclerosis extending to the iliac vessels. No pathologically enlarged lymph nodes. Reproductive: Uterus and bilateral adnexa are unremarkable. Other: No abdominal wall hernia or abnormality. No abdominopelvic ascites. Musculoskeletal: No fracture or acute finding. No osteoblastic or osteolytic lesions. IMPRESSION: 1. No acute findings. 2. Numerous left colon diverticula. No CT evidence of diverticulitis. 3. Fairly extensive aortic atherosclerosis. Electronically Signed   By: Lajean Manes M.D.   On: 09/18/2017 20:49   Dg Chest Port 1 View  Result Date: 10/04/2017 CLINICAL DATA:  History of endotracheal tube EXAM: PORTABLE CHEST 1 VIEW COMPARISON:  Yesterday FINDINGS: Endotracheal tube tip at the clavicular heads. An orogastric tube reaches the stomach. Right upper extremity PICC with tip at the upper SVC. Diffuse interstitial coarsening with perihilar indistinct opacity. Haziness at the right base attributed to small pleural effusion. Normal heart size. No pneumothorax. IMPRESSION: 1. Perihilar opacity likely reflecting pneumonia. No definite change from yesterday. 2. Stable hardware positioning. Electronically Signed   By: Monte Fantasia M.D.   On: 10/04/2017 07:50   Dg Chest Port 1 View  Result Date: 10/03/2017 CLINICAL DATA:  Short of breath EXAM: PORTABLE CHEST 1 VIEW COMPARISON:  10/02/2017 FINDINGS: Endotracheal tube 4 cm above the carina. Right arm PICC tip in the SVC. NG tube  in the stomach Diffuse bilateral airspace disease unchanged. Small pleural effusions. No pneumothorax IMPRESSION: Support lines remain in good position. Diffuse bilateral airspace disease unchanged from yesterday Electronically Signed   By: Franchot Gallo M.D.   On: 10/03/2017 07:11   Dg Chest Port 1 View  Result Date: 10/02/2017 CLINICAL DATA:  Intubation  EXAM: PORTABLE CHEST 1 VIEW COMPARISON:  Portable exam 1109 hours compared to 10/01/2017 FINDINGS: Tip of endotracheal tube projects 3.1 cm above carina. Nasogastric tube extends into stomach. RIGHT arm PICC line tip projects over SVC. Upper normal heart size. Extensive BILATERAL pulmonary airspace infiltrates unchanged. Probable tiny LEFT pleural effusion. No pneumothorax. Bones demineralized. IMPRESSION: Persistent severe diffuse BILATERAL airspace infiltrates. Electronically Signed   By: Lavonia Dana M.D.   On: 10/02/2017 11:26   Dg Chest Port 1 View  Result Date: 10/01/2017 CLINICAL DATA:  Verify endotracheal tube. EXAM: PORTABLE CHEST 1 VIEW COMPARISON:  10/01/2017 FINDINGS: Endotracheal tube is in place with tip approximately 2.9 centimeters above the carina. Nasogastric tube is in place, tip overlying the level of the stomach and side-port in the region the gastroesophageal junction. A RIGHT-sided PICC line tip overlies the superior vena cava. Heart size is normal. There are airspace filling opacities bilaterally with central air bronchograms. There are small bilateral pleural effusions. IMPRESSION: 1. Interval placement of endotracheal tube, tip to be in good position. 2. Persistent bilateral airspace filling opacities and pleural effusions, similar to prior study. Electronically Signed   By: Nolon Nations M.D.   On: 10/01/2017 13:35   Dg Chest Port 1 View  Result Date: 10/01/2017 CLINICAL DATA:  Respiratory distress.  Follow-up. EXAM: PORTABLE CHEST 1 VIEW COMPARISON:  09/30/2017 FINDINGS: Nasogastric tube enters the abdomen. Right arm PICC tip is in the SVC above the right atrium. Widespread pulmonary opacity has worsened, consistent with progressive edema. Small effusions and dependent atelectasis. IMPRESSION: Progressive/worsened diffuse edema pattern. Electronically Signed   By: Nelson Chimes M.D.   On: 10/01/2017 11:33   Dg Chest Port 1 View  Result Date: 09/30/2017 CLINICAL DATA:   Shortness of breath EXAM: PORTABLE CHEST 1 VIEW COMPARISON:  02/20/2014 FINDINGS: An orogastric tube reaches the stomach. Right upper extremity PICC with tip at the upper SVC. Patchy bilateral airspace opacity. Streaky opacities at the bases with probable small effusions. Normal heart size for technique. No pneumothorax. IMPRESSION: 1. Patchy bilateral lung opacity primarily concerning for pneumonia. 2. There is low volumes with streaky opacity, likely superimposed atelectasis. Electronically Signed   By: Monte Fantasia M.D.   On: 09/30/2017 11:32   Dg Abd Portable 1v  Result Date: 10/05/2017 CLINICAL DATA:  Pain. Pt had abdominal surgery on 09/29/17. EXAM: PORTABLE ABDOMEN - 1 VIEW COMPARISON:  CT abdomen dated 09/26/2017. FINDINGS: Interval colostomy creation, LEFT abdomen. Overall bowel gas pattern is nonobstructive. No evidence of soft tissue mass or abnormal fluid collections seen. No acute or suspicious osseous finding. IMPRESSION: LEFT abdominal colostomy.  Nonobstructive bowel gas pattern. Electronically Signed   By: Franki Cabot M.D.   On: 10/05/2017 21:42     LOS: 12 days   Oren Binet, MD  [facility] Pager:[phone]  If 7PM-7AM, please contact night-coverage [email or web address] Password [email or web address] 10/09/2017, 1:19 PM

## 2017-10-09 NOTE — Progress Notes (Addendum)
Patient c/o lower abdominal pain and difficulty voiding. Bladder scan revealed 653cc urine. Patient reports that she's had ongoing issues with initiating urine since surgery. Barbara [name] notified.  0600: Pt able to void on BSC (500cc), reports relief.

## 2017-10-10 ENCOUNTER — Inpatient Hospital Stay (HOSPITAL_COMMUNITY)
Admission: RE | Admit: 2017-10-10 | Discharge: 2017-10-20 | DRG: 946 | Disposition: A | Discharge status: Home Health | Payer: Medicare Other | Source: Intra-hospital | Attending: Physical Medicine & Rehabilitation | Admitting: Physical Medicine & Rehabilitation

## 2017-10-10 ENCOUNTER — Encounter (HOSPITAL_COMMUNITY): Payer: Self-pay

## 2017-10-10 ENCOUNTER — Other Ambulatory Visit: Payer: Self-pay

## 2017-10-10 DIAGNOSIS — G8918 Other acute postprocedural pain: Secondary | ICD-10-CM

## 2017-10-10 DIAGNOSIS — Z880 Allergy status to penicillin: Secondary | ICD-10-CM | POA: Diagnosis not present

## 2017-10-10 DIAGNOSIS — Z79899 Other long term (current) drug therapy: Secondary | ICD-10-CM

## 2017-10-10 DIAGNOSIS — Z91030 Bee allergy status: Secondary | ICD-10-CM

## 2017-10-10 DIAGNOSIS — R10816 Epigastric abdominal tenderness: Secondary | ICD-10-CM | POA: Diagnosis not present

## 2017-10-10 DIAGNOSIS — K219 Gastro-esophageal reflux disease without esophagitis: Secondary | ICD-10-CM | POA: Diagnosis present

## 2017-10-10 DIAGNOSIS — I480 Paroxysmal atrial fibrillation: Secondary | ICD-10-CM

## 2017-10-10 DIAGNOSIS — H3530 Unspecified macular degeneration: Secondary | ICD-10-CM | POA: Diagnosis present

## 2017-10-10 DIAGNOSIS — Z888 Allergy status to other drugs, medicaments and biological substances status: Secondary | ICD-10-CM

## 2017-10-10 DIAGNOSIS — Z8249 Family history of ischemic heart disease and other diseases of the circulatory system: Secondary | ICD-10-CM

## 2017-10-10 DIAGNOSIS — J90 Pleural effusion, not elsewhere classified: Secondary | ICD-10-CM | POA: Diagnosis not present

## 2017-10-10 DIAGNOSIS — Z9842 Cataract extraction status, left eye: Secondary | ICD-10-CM

## 2017-10-10 DIAGNOSIS — E119 Type 2 diabetes mellitus without complications: Secondary | ICD-10-CM

## 2017-10-10 DIAGNOSIS — I2510 Atherosclerotic heart disease of native coronary artery without angina pectoris: Secondary | ICD-10-CM | POA: Diagnosis present

## 2017-10-10 DIAGNOSIS — A419 Sepsis, unspecified organism: Secondary | ICD-10-CM | POA: Diagnosis not present

## 2017-10-10 DIAGNOSIS — Z882 Allergy status to sulfonamides status: Secondary | ICD-10-CM

## 2017-10-10 DIAGNOSIS — R5381 Other malaise: Secondary | ICD-10-CM | POA: Diagnosis not present

## 2017-10-10 DIAGNOSIS — E1165 Type 2 diabetes mellitus with hyperglycemia: Secondary | ICD-10-CM | POA: Diagnosis not present

## 2017-10-10 DIAGNOSIS — M545 Low back pain: Secondary | ICD-10-CM | POA: Diagnosis not present

## 2017-10-10 DIAGNOSIS — I10 Essential (primary) hypertension: Secondary | ICD-10-CM | POA: Diagnosis present

## 2017-10-10 DIAGNOSIS — Z8673 Personal history of transient ischemic attack (TIA), and cerebral infarction without residual deficits: Secondary | ICD-10-CM | POA: Diagnosis not present

## 2017-10-10 DIAGNOSIS — Z7982 Long term (current) use of aspirin: Secondary | ICD-10-CM | POA: Diagnosis not present

## 2017-10-10 DIAGNOSIS — Z794 Long term (current) use of insulin: Secondary | ICD-10-CM | POA: Diagnosis not present

## 2017-10-10 DIAGNOSIS — F17210 Nicotine dependence, cigarettes, uncomplicated: Secondary | ICD-10-CM | POA: Diagnosis present

## 2017-10-10 DIAGNOSIS — Z9049 Acquired absence of other specified parts of digestive tract: Secondary | ICD-10-CM

## 2017-10-10 DIAGNOSIS — G8929 Other chronic pain: Secondary | ICD-10-CM | POA: Diagnosis present

## 2017-10-10 DIAGNOSIS — Z933 Colostomy status: Secondary | ICD-10-CM

## 2017-10-10 DIAGNOSIS — R630 Anorexia: Secondary | ICD-10-CM | POA: Diagnosis not present

## 2017-10-10 DIAGNOSIS — E785 Hyperlipidemia, unspecified: Secondary | ICD-10-CM | POA: Diagnosis not present

## 2017-10-10 DIAGNOSIS — Z8719 Personal history of other diseases of the digestive system: Secondary | ICD-10-CM

## 2017-10-10 DIAGNOSIS — R509 Fever, unspecified: Secondary | ICD-10-CM

## 2017-10-10 DIAGNOSIS — E43 Unspecified severe protein-calorie malnutrition: Secondary | ICD-10-CM | POA: Diagnosis not present

## 2017-10-10 LAB — BASIC METABOLIC PANEL
Anion gap: 12 (ref 5–15)
BUN: 6 mg/dL (ref 6–20)
CALCIUM: 8 mg/dL — AB (ref 8.9–10.3)
CO2: 29 mmol/L (ref 22–32)
CREATININE: 0.9 mg/dL (ref 0.44–1.00)
Chloride: 94 mmol/L — ABNORMAL LOW (ref 101–111)
GFR calc Af Amer: >60 mL/min (ref >60)
GFR calc non Af Amer: >60 mL/min (ref >60)
GLUCOSE: 214 mg/dL — AB (ref 65–99)
Potassium: 4.2 mmol/L (ref 3.5–5.1)
Sodium: 135 mmol/L (ref 135–145)

## 2017-10-10 LAB — CBC
HEMATOCRIT: 29.6 % — AB (ref 36.0–46.0)
HEMOGLOBIN: 9.6 g/dL — AB (ref 12.0–15.0)
MCH: 29.2 pg (ref 26.0–34.0)
MCHC: 32.4 g/dL (ref 30.0–36.0)
MCV: 90 fL (ref 78.0–100.0)
Platelets: 437 10*3/uL — ABNORMAL HIGH (ref 150–400)
RBC: 3.29 MIL/uL — ABNORMAL LOW (ref 3.87–5.11)
RDW: 13.6 % (ref 11.5–15.5)
WBC: 13.3 10*3/uL — ABNORMAL HIGH (ref 4.0–10.5)

## 2017-10-10 LAB — GLUCOSE, CAPILLARY
Glucose-Capillary: 217 mg/dL — ABNORMAL HIGH (ref 65–99)
Glucose-Capillary: 229 mg/dL — ABNORMAL HIGH (ref 65–99)

## 2017-10-10 MED ORDER — FLECAINIDE ACETATE 50 MG PO TABS
100.0000 mg | ORAL_TABLET | Freq: Two times a day (BID) | ORAL | Status: DC
  Administered 2017-10-10 – 2017-10-20 (×20): 100 mg via ORAL
  Filled 2017-10-10 (×20): qty 2

## 2017-10-10 MED ORDER — PANTOPRAZOLE SODIUM 40 MG PO TBEC: 40.0000 mg | DELAYED_RELEASE_TABLET | Freq: Every day | ORAL | Status: DC

## 2017-10-10 MED ORDER — WHITE PETROLATUM EX OINT: TOPICAL_OINTMENT | CUTANEOUS | Status: DC | PRN

## 2017-10-10 MED ORDER — METOPROLOL TARTRATE 12.5 MG HALF TABLET
12.5000 mg | ORAL_TABLET | Freq: Two times a day (BID) | ORAL | Status: DC
  Administered 2017-10-10 – 2017-10-20 (×20): 12.5 mg via ORAL
  Filled 2017-10-10 (×20): qty 1

## 2017-10-10 MED ORDER — HEPARIN SODIUM (PORCINE) 5000 UNIT/ML IJ SOLN
5000.0000 [IU] | Freq: Three times a day (TID) | INTRAMUSCULAR | Status: DC
  Administered 2017-10-10 – 2017-10-20 (×29): 5000 [IU] via SUBCUTANEOUS
  Filled 2017-10-10 (×29): qty 1

## 2017-10-10 MED ORDER — ASPIRIN 81 MG PO CHEW
81.0000 mg | CHEWABLE_TABLET | Freq: Every day | ORAL | Status: DC
  Administered 2017-10-11 – 2017-10-20 (×10): 81 mg via ORAL
  Filled 2017-10-10 (×10): qty 1

## 2017-10-10 MED ORDER — ONDANSETRON HCL 4 MG PO TABS: 4.0000 mg | ORAL_TABLET | Freq: Four times a day (QID) | ORAL | Status: DC | PRN

## 2017-10-10 MED ORDER — OXYCODONE HCL 5 MG PO TABS
5.0000 mg | ORAL_TABLET | ORAL | Status: DC | PRN
Start: 2017-10-10 — End: 2017-10-20
  Administered 2017-10-10 – 2017-10-20 (×34): 10 mg via ORAL
  Filled 2017-10-10 (×34): qty 2

## 2017-10-10 MED ORDER — ONDANSETRON HCL 4 MG/2ML IJ SOLN: 4.0000 mg | Freq: Four times a day (QID) | INTRAMUSCULAR | Status: DC | PRN

## 2017-10-10 MED ORDER — METHOCARBAMOL 500 MG PO TABS
500.0000 mg | ORAL_TABLET | Freq: Three times a day (TID) | ORAL | Status: DC | PRN
  Administered 2017-10-10 – 2017-10-14 (×8): 500 mg via ORAL
  Filled 2017-10-10 (×8): qty 1

## 2017-10-10 MED ORDER — BRIMONIDINE TARTRATE 0.2 % OP SOLN
1.0000 [drp] | Freq: Three times a day (TID) | OPHTHALMIC | Status: DC
  Administered 2017-10-10 – 2017-10-20 (×28): 1 [drp] via OPHTHALMIC
  Filled 2017-10-10: qty 5

## 2017-10-10 MED ORDER — OXYCODONE HCL 5 MG PO TABS: 5.0000 mg | ORAL_TABLET | ORAL | 0 refills | Status: DC | PRN

## 2017-10-10 MED ORDER — INSULIN GLARGINE 100 UNIT/ML ~~LOC~~ SOLN: 6.0000 [IU] | Freq: Every morning | SUBCUTANEOUS | 11 refills | Status: DC

## 2017-10-10 MED ORDER — INSULIN GLARGINE 100 UNIT/ML ~~LOC~~ SOLN
6.0000 [IU] | Freq: Every day | SUBCUTANEOUS | Status: DC
  Administered 2017-10-10 – 2017-10-11 (×2): 6 [IU] via SUBCUTANEOUS
  Filled 2017-10-10 (×2): qty 0.06

## 2017-10-10 MED ORDER — INSULIN ASPART 100 UNIT/ML ~~LOC~~ SOLN
0.0000 [IU] | Freq: Three times a day (TID) | SUBCUTANEOUS | Status: DC
  Administered 2017-10-10 – 2017-10-11 (×4): 3 [IU] via SUBCUTANEOUS
  Administered 2017-10-12: 2 [IU] via SUBCUTANEOUS
  Administered 2017-10-12: 3 [IU] via SUBCUTANEOUS
  Administered 2017-10-12 – 2017-10-15 (×7): 2 [IU] via SUBCUTANEOUS
  Administered 2017-10-15: 1 [IU] via SUBCUTANEOUS
  Administered 2017-10-15 – 2017-10-16 (×4): 2 [IU] via SUBCUTANEOUS
  Administered 2017-10-17 (×3): 3 [IU] via SUBCUTANEOUS
  Administered 2017-10-18: 1 [IU] via SUBCUTANEOUS
  Administered 2017-10-18: 3 [IU] via SUBCUTANEOUS
  Administered 2017-10-18: 1 [IU] via SUBCUTANEOUS
  Administered 2017-10-19 (×2): 5 [IU] via SUBCUTANEOUS
  Administered 2017-10-19: 2 [IU] via SUBCUTANEOUS

## 2017-10-10 MED ORDER — HEPARIN SODIUM (PORCINE) 5000 UNIT/ML IJ SOLN: 5000.0000 [IU] | Freq: Three times a day (TID) | INTRAMUSCULAR | Status: DC

## 2017-10-10 MED ORDER — SIMETHICONE 80 MG PO CHEW
80.0000 mg | CHEWABLE_TABLET | Freq: Four times a day (QID) | ORAL | Status: DC | PRN
  Administered 2017-10-17 – 2017-10-18 (×2): 80 mg via ORAL
  Filled 2017-10-10 (×2): qty 1

## 2017-10-10 MED ORDER — ENSURE ENLIVE PO LIQD
237.0000 mL | Freq: Two times a day (BID) | ORAL | Status: DC
  Administered 2017-10-11: 237 mL via ORAL

## 2017-10-10 MED ORDER — ENSURE ENLIVE PO LIQD: 237.0000 mL | Freq: Two times a day (BID) | ORAL | 12 refills | Status: DC

## 2017-10-10 MED ORDER — PANTOPRAZOLE SODIUM 40 MG PO TBEC
40.0000 mg | DELAYED_RELEASE_TABLET | Freq: Every day | ORAL | Status: DC
  Administered 2017-10-10 – 2017-10-14 (×5): 40 mg via ORAL
  Filled 2017-10-10 (×6): qty 1

## 2017-10-10 NOTE — Progress Notes (Signed)
Inpatient Rehabilitation  Continuing to follow.  Met with patient and daughter at bedside and discuss anticipated medical readiness for IP Rehab today.  I await insurance approval.  Updated nurse case manager and plan to update team when I have a decision.  Call if questions.   Melissa Bowie, M.A., CCC/SLP Admission Coordinator  [facility]  Cell [phone]  

## 2017-10-10 NOTE — PMR Pre-admission (Signed)
PMR Admission Coordinator Pre-Admission Assessment  Patient: Barbara [name] is an 77 y.o., female MRN: [id number] DOB: Dec 08, 1940 Height: 5\' 3"  (160 cm) Weight: 66.3 kg (146 lb 2.6 oz)              Insurance Information HMO:     PPO: X     PCP:      IPA:      80/20:      OTHER: PRIMARY: BCBS Medicare       Policy#: [id number]      Subscriber: Self CM Name: Jolene Schimke       Phone#: [phone]     Fax#: [phone] Pre-Cert#: Verbal ok to proceed with admission on 10/10/17;                                        Employer: Retired  Benefits:  Phone #: [phone]     Name: Verified online at [email or web address] Eff. Date: 05/24/17     Deduct: $0      Out of Pocket Max: $5900      Life Max: N/A CIR: $310 a day, days 1-6; $0 a day, days 7+      SNF: $0 a day, days 1-20; $172 a day, days 21-60; $0 a day, days 61-100 Outpatient: Necessity      Co-Pay: $40 per visit  Home Health: Necessity       Co-Pay: $0 per visit  DME: 80%     Co-Pay: 20% Providers: In-network   SECONDARY: None        Emergency Contact Information Contact Information    Name Relation Home Work [place] Daughter [phone]  [phone]   Jessieca, Rhem [phone]     Holt,Tiffany Grandaughter   [phone]     Current Medical History  Patient Admitting Diagnosis: Debility related to peritonitis/sepsis   History of Present Illness:  Marlita Keil is a 77 year old right-handed female with history of diabetes mellitus, hypertension, PAF with history of GI bleed, chronic low back pain with radiculopathy bilateral lower extremities and recent admission for diverticulitis with abscess and discharged home on 09/25/2017.  Per chart review patient lives with spouse, who is legally blind, and daughter is available to assist upon dischrage.  Independent prior to admission.  She was still driving doing her grocery shopping.  One level home with 5 steps to entry.  Presented to [facility] on 09/26/2017  with increasing abdominal pain as well as nausea vomiting and diarrhea.  Developed SVT requiring synchronized cardioversion but developed hypotension.  She was started on amiodarone and transferred to [facility] for management.  Supportive management recommended by EP and has been maintained on Tambocor.  Due to sepsis with diffuse peritonitis, surgical intervention recommended and underwent exploratory laparotomy with left and sigmoid colectomy colostomy by Dr. Redmond Pulling.  She tolerated extubation without difficulty but needed reintubation 10/02/2017 due to increasing shortness of breath and lethargy with delirium.  Postoperative course significant for intermittent hypotension with prolonged ileus, electrolyte abnormalities, and progressive leukocytosis.  She was later extubated 10/04/2017.  A follow-up CT of the abdomen and pelvis completed showing no recurrent abscess identified.  No obstructive changes.  Subcutaneous heparin for DVT prophylaxis.  Physical and occupational therapy evaluations completed with recommendations of physical medicine rehab consult.  Patient was admitted for a comprehensive rehab program 10/10/17.        Past Medical  History  Past Medical History:  Diagnosis Date  . Arteriosclerotic cardiovascular disease (ASCVD)    non-obstructive; negative stress nuclear and normal echo in 08/2009 [facility] cardiac [facility] in 03/2000-20%  main 20% LAD, 30% circumflex, 30% RCA, hyperdynamic LV  . Asthma    Mild  . Chest pain   . Chronic back pain   . Diabetes mellitus    A1c of 7.6 in 04/2010; moderate to high dose insulin  . GERD (gastroesophageal reflux disease)   . Hyperlipidemia    total cholesterol of 271 and LDL of 187 in 07/2010  . Hypertension   . Macular degeneration   . Palpitations   . Stroke [facility]) 1993 2008   short term memory loss  . Tobacco abuse     Family History  family history includes Cancer in her brother, father, sister, sister, and sister;  Heart attack in her brother and mother.  Prior Rehab/Hospitalizations:  Has the patient had major surgery during 100 days prior to admission? No  Current Medications   Current Facility-Administered Medications:  .  acetaminophen (TYLENOL) suppository 650 mg, 650 mg, Rectal, Q6H PRN, Deterding, Guadelupe Sabin, MD .  aspirin chewable tablet 81 mg, 81 mg, Oral, Daily, Ghimire, Henreitta Leber, MD, 81 mg at 10/10/17 8341 .  brimonidine (ALPHAGAN) 0.2 % ophthalmic solution 1 drop, 1 drop, Both Eyes, TID, Greer Pickerel, MD, 1 drop at 10/10/17 [id number] .  chlorhexidine (PERIDEX) 0.12 % solution 15 mL, 15 mL, Mouth Rinse, BID, Rush Farmer, MD, 15 mL at 10/10/17 0819 .  Chlorhexidine Gluconate Cloth 2 % PADS 6 each, 6 each, Topical, Daily, Greer Pickerel, MD, 6 each at 10/09/17 0910 .  dextrose 5 %-0.45 % sodium chloride infusion, , Intravenous, Continuous, Deterding, Guadelupe Sabin, MD, Last Rate: 10 mL/hr at 10/09/17 0901 .  feeding supplement (ENSURE ENLIVE) (ENSURE ENLIVE) liquid 237 mL, 237 mL, Oral, BID BM, Rush Farmer, MD, 237 mL at 10/10/17 0821 .  flecainide (TAMBOCOR) tablet 100 mg, 100 mg, Oral, BID, Ghimire, Henreitta Leber, MD, 100 mg at 10/10/17 0820 .  heparin injection 5,000 Units, 5,000 Units, Subcutaneous, Q8H, Greer Pickerel, MD, 5,000 Units at 10/10/17 1223 .  HYDROmorphone (DILAUDID) injection 0.5 mg, 0.5 mg, Intravenous, Q4H PRN, Jonetta Osgood, MD, 0.5 mg at 10/10/17 0842 .  insulin aspart (novoLOG) injection 0-9 Units, 0-9 Units, Subcutaneous, TID WC, Ghimire, Henreitta Leber, MD, 3 Units at 10/10/17 1223 .  insulin glargine (LANTUS) injection 6 Units, 6 Units, Subcutaneous, QHS, Ghimire, Henreitta Leber, MD, 6 Units at 10/09/17 2312 .  MEDLINE mouth rinse, 15 mL, Mouth Rinse, q12n4p, Rush Farmer, MD, 15 mL at 10/05/17 1532 .  methocarbamol (ROBAXIN) tablet 500 mg, 500 mg, Oral, Q8H PRN, Meuth, Brooke A, PA-C .  metoprolol tartrate (LOPRESSOR) tablet 12.5 mg, 12.5 mg, Oral, BID, Rush Farmer, MD,  12.5 mg at 10/10/17 0820 .  ondansetron (ZOFRAN) tablet 4 mg, 4 mg, Oral, Q6H PRN **OR** ondansetron (ZOFRAN) injection 4 mg, 4 mg, Intravenous, Q6H PRN, Greer Pickerel, MD, 4 mg at 10/06/17 1044 .  oxyCODONE (Oxy IR/ROXICODONE) immediate release tablet 5-10 mg, 5-10 mg, Oral, Q4H PRN, Jonetta Osgood, MD, 10 mg at 10/10/17 1223 .  pantoprazole (PROTONIX) EC tablet 40 mg, 40 mg, Oral, QHS, Rush Farmer, MD, 40 mg at 10/09/17 2312 .  simethicone (MYLICON) chewable tablet 80 mg, 80 mg, Oral, QID PRN, Meuth, Brooke A, PA-C .  sodium chloride flush (NS) 0.9 % injection 10-40 mL, 10-40 mL, Intracatheter,  PRN, Greer Pickerel, MD, 20 mL at 10/07/17 1605 .  white petrolatum (VASELINE) gel, , Topical, PRN, Erick Colace, NP  Patients Current Diet:  Diet Order           Diet Carb Modified Fluid consistency: Thin; Room service appropriate? Yes  Diet effective now        Diet - low sodium heart healthy          Precautions / Restrictions Precautions Precautions: Fall Precaution Comments: pt with wound VAC Restrictions Weight Bearing Restrictions: No   Has the patient had 2 or more falls or a fall with injury in the past year?Yes  Prior Activity Level Community (5-7x/wk): Prior to admission patient was fully independent at home and assisting spouse with home management due to him being legally blind.    Home Assistive Devices / Equipment Home Assistive Devices/Equipment: None Home Equipment: Toilet riser  Prior Device Use: Indicate devices/aids used by the patient prior to current illness, exacerbation or injury? None of the above  Prior Functional Level Prior Function Level of Independence: Independent Comments: drove, did grocery shopping, did not walk long distances due to compressed spine with back pain.   Self Care: Did the patient need help bathing, dressing, using the toilet or eating? Independent  Indoor Mobility: Did the patient need assistance with walking from room to  room (with or without device)? Independent  Stairs: Did the patient need assistance with internal or external stairs (with or without device)? Independent  Functional Cognition: Did the patient need help planning regular tasks such as shopping or remembering to take medications? Independent  Current Functional Level Cognition  Overall Cognitive Status: Within Functional Limits for tasks assessed Orientation Level: Oriented X4 General Comments: Pt is able to do quite a bit.  Did constantly encouarge daughter to let pt do for herself.  Daughter quick to do everything for pt including drink from a cup.    Extremity Assessment (includes Sensation/Coordination)  Upper Extremity Assessment: Generalized weakness  Lower Extremity Assessment: Defer to PT evaluation    ADLs  Overall ADL's : Needs assistance/impaired Eating/Feeding: Minimal assistance, Sitting Eating/Feeding Details (indicate cue type and reason): new diet just getting started but pt could drink from cup and use untensils I[place]. Grooming: Wash/dry hands, Wash/dry face, Oral care, Set up, Sitting Grooming Details (indicate cue type and reason): encouaged daughter to allow pt to do these tasks for herself at all times. Upper Body Bathing: Minimal assistance, Sitting Upper Body Bathing Details (indicate cue type and reason): Pt very sore but with encouragement can bathe in chair. Lower Body Bathing: Moderate assistance, Sit to/from stand, Cueing for compensatory techniques Lower Body Bathing Details (indicate cue type and reason): Pt requires assist reaching lower legs and when standing to reach backside. Upper Body Dressing : Moderate assistance, Sitting Lower Body Dressing: Moderate assistance, Sit to/from stand, Cueing for compensatory techniques Lower Body Dressing Details (indicate cue type and reason): Pt may benefit from AE to start to reach lower legs due to pain.   Toilet Transfer: Minimal assistance, BSC, RW Toileting-  Clothing Manipulation and Hygiene: Moderate assistance, Sit to/from stand Toileting - Clothing Manipulation Details (indicate cue type and reason): Colostomy bag emptied by nursing at this point. Pt ready to take this task over for herself. Functional mobility during ADLs: Minimal assistance, +2 for safety/equipment General ADL Comments: Pt is capeable of moving foward with doing more adls. Pt is most limited by pain in her abdomen.  Feel she may do more  if daughter is not in room.    Mobility  Overal bed mobility: Needs Assistance Bed Mobility: Sit to Supine Supine to sit: Mod assist Sit to supine: Mod assist General bed mobility comments: Pt able to get own legs into bed.  Daughter stating she should not do this as it is hard on her abdominal muscles but did explain that helping with this task will build up strength.    Transfers  Overall transfer level: Needs assistance Equipment used: 1 person hand held assist Transfers: Sit to/from Stand, Stand Pivot Transfers Sit to Stand: Mod assist Stand pivot transfers: Min assist General transfer comment: Pt did well once up; just needed more assist to power up.    Ambulation / Gait / Stairs / Wheelchair Mobility  Ambulation/Gait General Gait Details: unable today    Posture / Balance Dynamic Sitting Balance Sitting balance - Comments: touch support to encourage her efforts Balance Overall balance assessment: Needs assistance Sitting-balance support: Feet supported Sitting balance-Leahy Scale: Fair Sitting balance - Comments: touch support to encourage her efforts Postural control: Posterior lean Standing balance support: Bilateral upper extremity supported, During functional activity Standing balance-Leahy Scale: Fair Standing balance comment: using PT support directly on trunk and min assist to step to chair    Special needs/care consideration BiPAP/CPAP: No CPM: No Continuous Drip IV: No Dialysis: No        Life Vest: No Oxygen:  None PTA, Now 2.5 L nasal cannula being weaned  Special Bed: No Trach Size: No Wound Vac (area): Yes      Location: Abdomen  Skin: Ecchymosis                               Location: Bilateral arms Bowel mgmt: New colostomy changed this morning by [place] nurse with small amount of brown unformed stool  Bladder mgmt: External foley  Diabetic mgmt: Yes with frequent home CBG checks, 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  Discharge Living Setting Plans for Discharge Living Setting: Other (Comment)(Patient's daughter has offered for her to go home with her ) Type of Home at Discharge: House Discharge Home Layout: One level Discharge Home Access: Stairs to enter Entrance Stairs-Rails: Can reach both Entrance Stairs-Number of Steps: 3 Discharge Bathroom Shower/Tub: Walk-in shower Discharge Bathroom Toilet: Handicapped height Discharge Bathroom Accessibility: Yes How Accessible: Accessible via walker Does the patient have any problems obtaining your medications?: No  Social/Family/Support Systems Patient Roles: Spouse, Parent, Other (Comment)(Grandparent ) Contact Information: Daughter, Manuela Schwartz  Anticipated Caregiver: Daughter and granddaughter  Anticipated Caregiver's Contact Information: [phone] Ability/Limitations of Caregiver: None Caregiver Availability: 24/7 Discharge Plan Discussed with Primary Caregiver:  Yes Is Caregiver In Agreement with Plan?: Yes Does Caregiver/Family have Issues with Lodging/Transportation while Pt is in Rehab?: No  Goals/Additional Needs Patient/Family Goal for Rehab: PT/OT/SLP: Supervision-Mod I  Expected length of stay: 12-18 days  Cultural Considerations: Baptist  Dietary Needs: Carb. Mod. diet restrictions  Equipment Needs:  TBD Special Service Needs: [place] Nurse following  Additional Information: Pt's spouse is legally blind and likley unable to assist with care Pt/Family Agrees to Admission and willing to participate: Yes Program Orientation Provided & Reviewed with Pt/Caregiver Including Roles  & Responsibilities: Yes Additional Information Needs: Pt. with new colonostomy and would vac needs education; history of being a diabetic that needs to monitored closely  Information Needs to be Provided By: Medical team FYI  Barriers to Discharge: Medical stability, Wound Care  Decrease burden of Care through IP rehab admission: No  Possible need for SNF placement upon discharge: Not anticipated   Patient Condition: This patient's medical and functional status has changed since the consult dated: 10/06/17 in which the Rehabilitation Physician determined and documented that the patient's condition is appropriate for intensive rehabilitative care in an inpatient rehabilitation facility. See "History of Present Illness" (above) for medical update. Functional changes are: Min-Mod A transfers. Patient's medical and functional status update has been discussed with the Rehabilitation physician and patient remains appropriate for inpatient rehabilitation. Will admit to inpatient rehab today.  Preadmission Screen Completed By:  Gunnar Fusi, 10/10/2017 2:43 PM ______________________________________________________________________   Discussed status with Dr. Posey Pronto on 10/10/17 at 1514 and received telephone approval for admission today.  Admission Coordinator:  Gunnar Fusi, time  1514/Date 10/10/17

## 2017-10-10 NOTE — Progress Notes (Signed)
Patient discharged to 4W-06 for CIR. Report called to Seth Bake. Patient stable and alert and oriented upon transfer. Wound vac in place and PICC line removed by IV team. On oxygen at 2L/[place].

## 2017-10-10 NOTE — Care Management Note (Addendum)
Case Management Note  Patient Details  Name: Barbara [name] MRN: [id number] Date of Birth: Dec 16, 1940  Subjective/Objective:  Presents with abd pain,  hx of DM, HTN. Recently admitted to the hospital with sigmoid radiculitis with abscess and discharged on Cipro and Flagyl on 09/25/2017. From home with family.          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         -Pt with abd wound, vac drsg               Gwenlyn Fudge (Daughter) Dashana Guizar (Spouse)    [phone]      PCP: Asencion Noble  Action/Plan: Transition to CIR, insurance authorization pending.... If not approved family plans to care for pt @ home. Pt/family refuses SNF placement. NCM will continue to follow for transitional care needs  Expected Discharge Date:                  Expected Discharge Plan:  IP Rehab Facility(CSW following for back up plan)  In-House Referral:  Clinical Social Work  Discharge planning Services  CM Consult  Post Acute Care Choice:    Choice offered to:     DME Arranged:    DME Agency:     HH Arranged:    [place] Agency:     Status of Service:   in process  If discussed at [employer], dates discussed:    Additional Comments:  Sharin Mons, RN 10/10/2017, 10:44 AM

## 2017-10-10 NOTE — Progress Notes (Signed)
PROGRESS NOTE        PATIENT DETAILS Name: Barbara [name] Age: 77 y.o. Sex: female Date of Birth: 1940/12/21 Admit Date: 09/26/2017 Admitting Physician Oswald Hillock, MD [id number], Carloyn Manner, MD  Brief Narrative: Patient is a 77 y.o. female with history of DM-2, hypertension, recently discharged from any pain hospital on 5/5 after being treated for sigmoid diverticulitis with abscess-presented to the hospital on 5/6 with worsening abdominal pain, and to have worsening diverticular perforation-plans were to undergo a partial colectomy/colostomy-but had a episode of unstable wide-complex tachycardia with hypotension requiring synchronized cardioversion, patient was subsequently transferred to [facility] underwent a colectomy with colostomy placement on 5/9, postoperatively patient developed worsening hypoxic respiratory failure requiring intubation.  She was managed in the intensive care unit, and subsequently transferred to the hospitalist service on 5/16.  Subjective: No major issues overnight-well-controlled abdominal pain.  Is very weak and deconditioned.  Long conversation with daughter at bedside  Assessment/Plan: Acute hypoxic respiratory failure: Occurred in the postoperative setting-requiring intubation-extubated on 5/14.  Continue to encourage incentive spirometry, have instructed RN to slowly titrate off oxygen.  Severe sepsis in the setting of perforated diverticulitis with abscess: Sepsis pathophysiology has resolved.  She underwent exploratory laparotomy/colectomy colostomy on 5/9.  Repeat CT on 5/16 did not show any residual abscess-no longer on meropenem.  Remains stable off antimicrobial therapy.  Acute metabolic encephalopathy: Proved-daughter reports intermittent mild confusion/delirium mostly at evening/night.  Will attempt to minimize narcotics as much as possible.  Reviewed ICU notes-patient apparently had significant amounts of delirium and  postoperative confusion.    Left thigh pain: Improved-appears to be mostly neuropathic (burning sensation)-exam continues to be benign without any erythema or swelling.  Continue supportive care.    Peripheral arterial disease: ABI on 5/17-done due to left lower extremity pain-slight decrease in temperature of the toes compared to the contralateral toes-ABI showed severe disease bilaterally but more on the left.  Patient does not have any motor or sensory symptoms.  Case was discussed with Dr. Donnetta Hutching, advises supportive care for now-we will need follow-up in the outpatient setting.  Continue aspirin.  Symptomatic wide-complex tachycardia: occurred on 5/8 requiring emergent synchronized cardioversion with 200 J.  Was briefly maintained on amiodarone evaluated by electrophysiology-thought to have SVT with aberrancy.  Prior to this hospitalization patient was maintained on flecainide-Case was discussed with Dr. Caryl Comes on 5/19-who knows this patient well, he reviewed the chart and recommended that we restart flecainide.  Acute kidney injury: Resolved, suspect this was secondary to hemodynamically mediated kidney injury in the setting of sepsis  Mild hyperkalemia: Resolved  Diabetic ketoacidosis: Resolved  Anemia: Converted to acute illness-hemoglobin stable-follow periodically.  Leukocytosis: Doubt active infection-likely reactive secondary to inflammation/recent surgery.  CT abdomen on 5/16- for infection.  No longer on antimicrobial therapy.  CBC periodically  DM-2: Did have an episode of hypoglycemia on 5/16-following which Lantus dosing was adjusted to 6 units-following which CBGs remained stable.  Follow closely.  History of paroxysmal atrial fibrillation/SVT: Reviewed of prior notes indicate patient not on anticoagulation due to prior history of bleeding-continue metoprolol.  As noted above-Case was discussed with patient's primary electrophysiologist Dr. Ilda Foil  flecainide.  Deconditioning: Secondary to acute illness/prolonged hospitalization-out of bed to chair-encouraged patient to participate in rehab activities-hopefully CIR on discharge when bed available  DVT Prophylaxis: Prophylactic Heparin   Code Status: Full  code  Family Communication: Daughter at bedside  Disposition Plan: Remain inpatient-hopefully CIR if bed available soon  Antimicrobial agents: Anti-infectives (From admission, onward)   Start     Dose/Rate Route Frequency Ordered Stop   10/02/17 1800  meropenem (MERREM) 1 g in sodium chloride 0.9 % 100 mL IVPB  Status:  Discontinued     1 g 200 mL/hr over 30 Minutes Intravenous Every 8 hours 10/02/17 1043 10/07/17 1311   09/27/17 1200  meropenem (MERREM) 1 g in sodium chloride 0.9 % 100 mL IVPB  Status:  Discontinued     1 g 200 mL/hr over 30 Minutes Intravenous Every 12 hours 09/27/17 1032 10/02/17 1043   09/27/17 1000  ciprofloxacin (CIPRO) IVPB 400 mg  Status:  Discontinued     400 mg 200 mL/hr over 60 Minutes Intravenous Every 12 hours 09/27/17 0547 09/27/17 1029   09/27/17 0800  metroNIDAZOLE (FLAGYL) IVPB 500 mg  Status:  Discontinued     500 mg 100 mL/hr over 60 Minutes Intravenous Every 8 hours 09/27/17 0547 09/27/17 1030   09/26/17 2315  ciprofloxacin (CIPRO) IVPB 400 mg     400 mg 200 mL/hr over 60 Minutes Intravenous  Once 09/26/17 2304 09/27/17 0032   09/26/17 2315  metroNIDAZOLE (FLAGYL) IVPB 500 mg     500 mg 100 mL/hr over 60 Minutes Intravenous  Once 09/26/17 2304 09/27/17 0229      Procedures: 5/9>>EXPLORATORY LAPAROTOMY (N/A)LEFT & SIGMOID COLECTOMY WITH COLOSTOMY CREATION (HARTMANN PROCEDURE) (Left) 5/11>>ETT  CONSULTS:  cardiology, pulmonary/intensive care and general surgery  Time spent: 25-minutes-Greater than 50% of this time was spent in counseling, explanation of diagnosis, planning of further management, and coordination of care.  MEDICATIONS: Scheduled Meds: . aspirin  81 mg Oral  Daily  . brimonidine  1 drop Both Eyes TID  . chlorhexidine  15 mL Mouth Rinse BID  . Chlorhexidine Gluconate Cloth  6 each Topical Daily  . feeding supplement (ENSURE ENLIVE)  237 mL Oral BID BM  . flecainide  100 mg Oral BID  . heparin injection (subcutaneous)  5,000 Units Subcutaneous Q8H  . insulin aspart  0-9 Units Subcutaneous TID WC  . insulin glargine  6 Units Subcutaneous QHS  . mouth rinse  15 mL Mouth Rinse q12n4p  . metoprolol tartrate  12.5 mg Oral BID  . pantoprazole  40 mg Oral QHS   Continuous Infusions: . dextrose 5 % and 0.45% NaCl 10 mL/hr at 10/09/17 0901   PRN Meds:.acetaminophen, HYDROmorphone (DILAUDID) injection, methocarbamol, ondansetron **OR** ondansetron (ZOFRAN) IV, oxyCODONE, simethicone, sodium chloride flush, white petrolatum   PHYSICAL EXAM: Vital signs: Vitals:   10/09/17 1445 10/09/17 2134 10/10/17 0608 10/10/17 0819  BP: (!) 137/54 (!) 128/44 (!) 149/47 (!) 143/48  Pulse: 67 72 73 74  Resp: 16 16 16    Temp: 99.2 F (37.3 C) 99 F (37.2 C) 97.6 F (36.4 C)   TempSrc: Oral Oral Oral   SpO2: 94% 98% 98%   Weight:   66.3 kg (146 lb 2.6 oz)   Height:       Filed Weights   10/08/17 0948 10/09/17 0500 10/10/17 0608  Weight: 67.1 kg (147 lb 14.4 oz) 66.5 kg (146 lb 9.7 oz) 66.3 kg (146 lb 2.6 oz)   Body mass index is 25.89 kg/m.   General appearance:Awake, alert, not in any distress.  Frail/chronically sick appearing Eyes:no scleral icterus. HEENT: Atraumatic and Normocephalic Neck: supple, no JVD. Resp:Good air entry bilaterally,no rales or rhonchi CVS: S1 S2 regular,  no murmurs.  GI: Bowel sounds present, mildly tender-but no peritoneal signs. Extremities: B/L Lower Ext shows no edema, both legs are warm to touch Neurology:  Non focal Psychiatric: Normal judgment and insight. Normal mood. Musculoskeletal:No digital cyanosis Skin:No Rash, warm and dry Wounds:N/A  I have personally reviewed following labs and imaging  studies  LABORATORY DATA: CBC: Recent Labs  Lab 10/04/17 0440 10/05/17 0437 10/06/17 0427 10/07/17 0408 10/10/17 0445  WBC 10.8* 13.4* 13.8* 15.7* 13.3*  NEUTROABS 7.5 9.2* 9.8*  --   --   HGB 9.3* 9.8* 9.3* 9.5* 9.6*  HCT 28.3* 28.9* 28.0* 30.0* 29.6*  MCV 91.3 86.0 87.8 90.4 90.0  PLT 260 275 298 331 437*    Basic Metabolic Panel: Recent Labs  Lab 10/04/17 0545 10/05/17 0437 10/06/17 0427 10/07/17 0408 10/08/17 0825 10/10/17 0445  NA 139 136 137 134* 131* 135  K 3.1* 3.4* 4.4 5.2* 4.3 4.2  CL 93* 90* 96* 95* 92* 94*  CO2 37* 34* 33* 26 29 29   GLUCOSE 208* 127* 62* 284* 247* 214*  BUN 16 17 18  22* 18 6  CREATININE 0.81 0.80 0.84 1.18* 1.07* 0.90  CALCIUM 7.5* 7.6* 7.9* 7.9* 7.8* 8.0*  MG 1.8 2.1 2.0  --   --   --   PHOS 2.9 3.5 2.5  --   --   --     GFR: Estimated Creatinine Clearance: 50.2 mL/min (by C-G formula based on SCr of 0.9 mg/dL).  Liver Function Tests: Recent Labs  Lab 10/04/17 0545  AST 61*  ALT 26  ALKPHOS 75  BILITOT 0.3  PROT 4.4*  ALBUMIN 1.3*   No results for input(s): LIPASE, AMYLASE in the last 168 hours. No results for input(s): AMMONIA in the last 168 hours.  Coagulation Profile: No results for input(s): INR, PROTIME in the last 168 hours.  Cardiac Enzymes: No results for input(s): CKTOTAL, CKMB, CKMBINDEX, TROPONINI in the last 168 hours.  BNP (last 3 results) No results for input(s): PROBNP in the last 8760 hours.  HbA1C: No results for input(s): HGBA1C in the last 72 hours.  CBG: Recent Labs  Lab 10/09/17 1227 10/09/17 1503 10/09/17 1710 10/09/17 2233 10/10/17 0759  GLUCAP 130* 120* 136* 150* 217*    Lipid Profile: No results for input(s): CHOL, HDL, LDLCALC, TRIG, CHOLHDL, LDLDIRECT in the last 72 hours.  Thyroid Function Tests: No results for input(s): TSH, T4TOTAL, FREET4, T3FREE, THYROIDAB in the last 72 hours.  Anemia Panel: No results for input(s): VITAMINB12, FOLATE, FERRITIN, TIBC, IRON, RETICCTPCT  in the last 72 hours.  Urine analysis:    Component Value Date/Time   COLORURINE YELLOW 09/26/2017 2231   APPEARANCEUR CLEAR 09/26/2017 2231   LABSPEC 1.014 09/26/2017 2231   PHURINE 6.0 09/26/2017 2231   GLUCOSEU NEGATIVE 09/26/2017 2231   HGBUR SMALL (A) 09/26/2017 2231   BILIRUBINUR NEGATIVE 09/26/2017 2231   KETONESUR 20 (A) 09/26/2017 2231   PROTEINUR NEGATIVE 09/26/2017 2231   UROBILINOGEN 0.2 07/06/2009 1116   NITRITE NEGATIVE 09/26/2017 2231   LEUKOCYTESUR TRACE (A) 09/26/2017 2231    Sepsis Labs: Lactic Acid, Venous    Component Value Date/Time   LATICACIDVEN 1.4 10/01/2017 1135    MICROBIOLOGY: No results found for this or any previous visit (from the past 240 hour(s)).  RADIOLOGY STUDIES/RESULTS: Ct Abdomen Pelvis W Contrast  Result Date: 10/06/2017 CLINICAL DATA:  History of diverticulitis with perforation and abscess formation, recent colectomy with Hartmann's pouch creation EXAM: CT ABDOMEN AND PELVIS WITH CONTRAST TECHNIQUE: Multidetector  CT imaging of the abdomen and pelvis was performed using the standard protocol following bolus administration of intravenous contrast. CONTRAST:  181mL OMNIPAQUE IOHEXOL 300 MG/ML  SOLN COMPARISON:  09/26/2017 FINDINGS: Lower chest: Bibasilar atelectatic changes with small effusions are noted new from the prior exam. Hepatobiliary: Mild fatty infiltration of the liver is noted. Gallbladder is within normal limits. Pancreas: Unremarkable. No pancreatic ductal dilatation or surrounding inflammatory changes. Spleen: Normal in size without focal abnormality. Adrenals/Urinary Tract: Adrenal glands are within normal limits. 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 No obstructive changes are seen. No renal calculi are noted. No focal mass is seen. The bladder is partially decompressed by Foley catheter. Air is noted within the bladder related to the prior instrumentation. Stomach/Bowel: Postoperative changes are now  seen with a Hartmann's pouch in the pelvis. Left mid abdominal ostomy is noted. The appendix is not visualized consistent with a prior surgical history. No obstructive changes are noted. Mild small bowel edema is noted likely related to the recent surgery. Vascular/Lymphatic: Aortic atherosclerosis. No enlarged abdominal or pelvic lymph nodes. Reproductive: Uterus and bilateral adnexa are unremarkable. Other: Small amount of free fluid is noted within the pelvis likely related to the recent surgery. No findings to suggest focal abscess are noted. Musculoskeletal: Degenerative changes of lumbar spine are noted. No acute bony abnormality is seen. Edematous changes are noted in the subcutaneous tissues of the abdominal wall and pelvis. IMPRESSION: Postoperative changes consistent with the given clinical history. No recurrent abscess is identified. Mild free fluid is noted likely postoperative in nature. Mild small bowel edema which may be related to the patient's underlying abdominal pain. No obstructive changes are noted. This is likely related to the recent surgery. Bilateral pleural effusions and bibasilar atelectatic changes new from the prior exam. No other focal abnormality is noted at this time. Electronically Signed   By: Inez Catalina M.D.   On: 10/06/2017 18:16   Ct Abdomen Pelvis W Contrast  Result Date: 09/27/2017 CLINICAL DATA:  77 y/o  F; lower abdominal pain. EXAM: CT ABDOMEN AND PELVIS WITH CONTRAST TECHNIQUE: Multidetector CT imaging of the abdomen and pelvis was performed using the standard protocol following bolus administration of intravenous contrast. CONTRAST:  164mL ISOVUE-300 IOPAMIDOL (ISOVUE-300) INJECTION 61% COMPARISON:  09/23/2017 CT abdomen and pelvis FINDINGS: Lower chest: No acute abnormality. Hepatobiliary: No focal liver abnormality is seen. No gallstones, gallbladder wall thickening, or biliary dilatation. Pancreas: Unremarkable. No pancreatic ductal dilatation or surrounding  inflammatory changes. Spleen: Normal in size without focal abnormality. Adrenals/Urinary Tract: Adrenal glands are unremarkable. Kidneys are normal, without renal calculi, focal lesion, or hydronephrosis. Bladder is unremarkable. Stomach/Bowel: Acute perforated diverticulitis (series 5, image 49) with oblong fluid collection extending between the bladder and the uterus measuring 7.1 cm in length and 1.8 cm in diameter (series 2, image 67 and series 5, image 45), increased in size in comparison with the prior CT of the abdomen and pelvis. Contrast and debris is present within the perforated collection. Stomach and small bowel are unremarkable. Vascular/Lymphatic: Aortic atherosclerosis. No enlarged abdominal or pelvic lymph nodes. Reproductive: Uterus and bilateral adnexa are unremarkable. Other: No abdominal wall hernia or abnormality. No abdominopelvic ascites. Musculoskeletal: No acute or significant osseous findings. IMPRESSION: Acute perforated diverticulitis. Pericolonic abscess extending between the bladder and uterus has increased in size and surrounding inflammation. Contrast and debris extends into the abscess cavity. Electronically Signed   By: Kristine Garbe M.D.   On: 09/27/2017 00:34   Ct  Abdomen Pelvis W Contrast  Result Date: 09/23/2017 CLINICAL DATA:  Abdominal pain, diverticulitis suspected EXAM: CT ABDOMEN AND PELVIS WITH CONTRAST TECHNIQUE: Multidetector CT imaging of the abdomen and pelvis was performed using the standard protocol following bolus administration of intravenous contrast. CONTRAST:  170mL ISOVUE-300 IOPAMIDOL (ISOVUE-300) INJECTION 61% COMPARISON:  CT abdomen/pelvis dated 09/10/2017. FINDINGS: Lower chest: Lung bases are clear. Hepatobiliary: Liver is within normal limits. Gallbladder is unremarkable. No intrahepatic or extrahepatic ductal dilatation. Pancreas: Within normal limits. Spleen: Within normal limits. Adrenals/Urinary Tract: Adrenal glands are within normal  limits. Kidneys within normal limits.  No hydronephrosis. Mildly thick-walled bladder. Stomach/Bowel: Stomach is within normal limits. No evidence of bowel obstruction. Prior appendectomy. Sigmoid diverticulosis, with mild pericolonic inflammatory changes in the left lower quadrant, reflecting acute diverticulitis. Associated 18 x 12 mm fluid/gas collection inferior to the sigmoid colon (series 2/image 60), suggesting a small pericolonic abscess, although this does not reflect a drainable fluid collection. No free air. Vascular/Lymphatic: No evidence of abdominal aortic aneurysm. Atherosclerotic calcifications of the abdominal aorta and branch vessels. No suspicious abdominopelvic lymphadenopathy. Reproductive: Uterus is within normal limits. Bilateral ovaries are within normal limits. Other: No abdominopelvic ascites. Musculoskeletal: Mild degenerative changes at L5-S1. IMPRESSION: Acute diverticulitis. 1.8 cm fluid/gas collection adjacent to the sigmoid colon, suggesting a small pericolonic abscess, although this does not reflect a drainable collection. No free air. Electronically Signed   By: Julian Hy M.D.   On: 09/23/2017 20:51   Ct Abdomen Pelvis W Contrast  Result Date: 09/18/2017 CLINICAL DATA:  Left lower abdomen and suprapubic pain beginning this morning. EXAM: CT ABDOMEN AND PELVIS WITH CONTRAST TECHNIQUE: Multidetector CT imaging of the abdomen and pelvis was performed using the standard protocol following bolus administration of intravenous contrast. CONTRAST:  138mL ISOVUE-300 IOPAMIDOL (ISOVUE-300) INJECTION 61% COMPARISON:  11/30/2011 FINDINGS: Lower chest: No acute abnormality. Hepatobiliary: No focal liver abnormality is seen. No gallstones, gallbladder wall thickening, or biliary dilatation. Pancreas: Unremarkable. No pancreatic ductal dilatation or surrounding inflammatory changes. Spleen: Normal in size without focal abnormality. Adrenals/Urinary Tract: Adrenal glands are  unremarkable. Kidneys are normal, without renal calculi, focal lesion, or hydronephrosis. Bladder is unremarkable. Stomach/Bowel: Stomach and small bowel unremarkable. There are numerous left colon diverticula. No diverticulitis or other colonic inflammatory process. Vascular/Lymphatic: There is prominent aortic atherosclerosis extending to the iliac vessels. No pathologically enlarged lymph nodes. Reproductive: Uterus and bilateral adnexa are unremarkable. Other: No abdominal wall hernia or abnormality. No abdominopelvic ascites. Musculoskeletal: No fracture or acute finding. No osteoblastic or osteolytic lesions. IMPRESSION: 1. No acute findings. 2. Numerous left colon diverticula. No CT evidence of diverticulitis. 3. Fairly extensive aortic atherosclerosis. Electronically Signed   By: Lajean Manes M.D.   On: 09/18/2017 20:49   Dg Chest Port 1 View  Result Date: 10/04/2017 CLINICAL DATA:  History of endotracheal tube EXAM: PORTABLE CHEST 1 VIEW COMPARISON:  Yesterday FINDINGS: Endotracheal tube tip at the clavicular heads. An orogastric tube reaches the stomach. Right upper extremity PICC with tip at the upper SVC. Diffuse interstitial coarsening with perihilar indistinct opacity. Haziness at the right base attributed to small pleural effusion. Normal heart size. No pneumothorax. IMPRESSION: 1. Perihilar opacity likely reflecting pneumonia. No definite change from yesterday. 2. Stable hardware positioning. Electronically Signed   By: Monte Fantasia M.D.   On: 10/04/2017 07:50   Dg Chest Port 1 View  Result Date: 10/03/2017 CLINICAL DATA:  Short of breath EXAM: PORTABLE CHEST 1 VIEW COMPARISON:  10/02/2017 FINDINGS: Endotracheal tube 4 cm above  the carina. Right arm PICC tip in the SVC. NG tube in the stomach Diffuse bilateral airspace disease unchanged. Small pleural effusions. No pneumothorax IMPRESSION: Support lines remain in good position. Diffuse bilateral airspace disease unchanged from yesterday  Electronically Signed   By: Franchot Gallo M.D.   On: 10/03/2017 07:11   Dg Chest Port 1 View  Result Date: 10/02/2017 CLINICAL DATA:  Intubation EXAM: PORTABLE CHEST 1 VIEW COMPARISON:  Portable exam 1109 hours compared to 10/01/2017 FINDINGS: Tip of endotracheal tube projects 3.1 cm above carina. Nasogastric tube extends into stomach. RIGHT arm PICC line tip projects over SVC. Upper normal heart size. Extensive BILATERAL pulmonary airspace infiltrates unchanged. Probable tiny LEFT pleural effusion. No pneumothorax. Bones demineralized. IMPRESSION: Persistent severe diffuse BILATERAL airspace infiltrates. Electronically Signed   By: Lavonia Dana M.D.   On: 10/02/2017 11:26   Dg Chest Port 1 View  Result Date: 10/01/2017 CLINICAL DATA:  Verify endotracheal tube. EXAM: PORTABLE CHEST 1 VIEW COMPARISON:  10/01/2017 FINDINGS: Endotracheal tube is in place with tip approximately 2.9 centimeters above the carina. Nasogastric tube is in place, tip overlying the level of the stomach and side-port in the region the gastroesophageal junction. A RIGHT-sided PICC line tip overlies the superior vena cava. Heart size is normal. There are airspace filling opacities bilaterally with central air bronchograms. There are small bilateral pleural effusions. IMPRESSION: 1. Interval placement of endotracheal tube, tip to be in good position. 2. Persistent bilateral airspace filling opacities and pleural effusions, similar to prior study. Electronically Signed   By: Nolon Nations M.D.   On: 10/01/2017 13:35   Dg Chest Port 1 View  Result Date: 10/01/2017 CLINICAL DATA:  Respiratory distress.  Follow-up. EXAM: PORTABLE CHEST 1 VIEW COMPARISON:  09/30/2017 FINDINGS: Nasogastric tube enters the abdomen. Right arm PICC tip is in the SVC above the right atrium. Widespread pulmonary opacity has worsened, consistent with progressive edema. Small effusions and dependent atelectasis. IMPRESSION: Progressive/worsened diffuse edema  pattern. Electronically Signed   By: Nelson Chimes M.D.   On: 10/01/2017 11:33   Dg Chest Port 1 View  Result Date: 09/30/2017 CLINICAL DATA:  Shortness of breath EXAM: PORTABLE CHEST 1 VIEW COMPARISON:  02/20/2014 FINDINGS: An orogastric tube reaches the stomach. Right upper extremity PICC with tip at the upper SVC. Patchy bilateral airspace opacity. Streaky opacities at the bases with probable small effusions. Normal heart size for technique. No pneumothorax. IMPRESSION: 1. Patchy bilateral lung opacity primarily concerning for pneumonia. 2. There is low volumes with streaky opacity, likely superimposed atelectasis. Electronically Signed   By: Monte Fantasia M.D.   On: 09/30/2017 11:32   Dg Abd Portable 1v  Result Date: 10/05/2017 CLINICAL DATA:  Pain. Pt had abdominal surgery on 09/29/17. EXAM: PORTABLE ABDOMEN - 1 VIEW COMPARISON:  CT abdomen dated 09/26/2017. FINDINGS: Interval colostomy creation, LEFT abdomen. Overall bowel gas pattern is nonobstructive. No evidence of soft tissue mass or abnormal fluid collections seen. No acute or suspicious osseous finding. IMPRESSION: LEFT abdominal colostomy.  Nonobstructive bowel gas pattern. Electronically Signed   By: Franki Cabot M.D.   On: 10/05/2017 21:42     LOS: 13 days   Oren Binet, MD  [facility] Pager:[phone]  If 7PM-7AM, please contact night-coverage [email or web address] Password [facility] 10/10/2017, 11:56 AM

## 2017-10-10 NOTE — Discharge Summary (Signed)
PATIENT DETAILS Name: Barbara [name] Age: 77 y.o. Sex: female Date of Birth: 1940/07/25 MRN: [id number]. Admitting Physician: Oswald Hillock, MD TWS:FKCLE, Carloyn Manner, MD  Admit Date: 09/26/2017 Discharge date: 10/10/2017  Recommendations for Outpatient Follow-up:  1. Follow up with PCP in 1-2 weeks upon d/c from CIR 2. Needs Vascular Surgery follow up upon d/c from CIR  Admitted From:  Home  Disposition: [place]: No  Equipment/Devices: None  Discharge Condition: Stable  CODE STATUS: FULL CODE  Diet recommendation:  Heart Healthy / Carb Modified   Brief Summary: See H&P, Labs, Consult and Test reports for all details in brief,Patient is a 77 y.o. female with history of DM-2, hypertension, recently discharged from any pain hospital on 5/5 after being treated for sigmoid diverticulitis with abscess-presented to the hospital on 5/6 with worsening abdominal pain, and to have worsening diverticular perforation-plans were to undergo a partial colectomy/colostomy-but had a episode of unstable wide-complex tachycardia with hypotension requiring synchronized cardioversion, patient was subsequently transferred to [facility] underwent a colectomy with colostomy placement on 5/9, postoperatively patient developed worsening hypoxic respiratory failure requiring intubation.  She was managed in the intensive care unit, and subsequently transferred to the hospitalist service on 5/16  [place] [facility] Course: Acute hypoxic respiratory failure: Occurred in the postoperative setting-requiring intubation-extubated on 5/14.  Continue to encourage incentive spirometry, have instructed RN to slowly titrate off oxygen. Stable to transfer to CIR when bed available  Severe sepsis in the setting of perforated diverticulitis with abscess: Sepsis pathophysiology has resolved.  She underwent exploratory laparotomy/colectomy colostomy on 5/9.  Repeat CT on 5/16 did not show any residual  abscess-hence no longer on meropenem.  Remains stable off antimicrobial therapy.  Acute metabolic encephalopathy: Improved-daughter reports intermittent mild confusion/delirium mostly at evening/night.  Will attempt to minimize narcotics as much as possible.  Reviewed ICU notes-patient apparently had significant amounts of delirium and postoperative confusion.    Left thigh pain: Improved-appears to be mostly neuropathic (burning sensation)-exam continues to be benign without any erythema or swelling.  Continue supportive care.    Peripheral arterial disease: ABI on 5/17-done due to left lower extremity pain-slight decrease in temperature of the toes compared to the contralateral toes-ABI showed severe disease bilaterally but more on the left.  Patient does not have any motor or sensory symptoms.  Case was discussed with Dr. Donnetta Hutching, advises supportive care for now-we will need follow-up in the outpatient setting.  Continue aspirin.  Symptomatic wide-complex tachycardia: occurred on 5/8 requiring emergent synchronized cardioversion with 200 J.  Was briefly maintained on amiodarone evaluated by electrophysiology-thought to have SVT with aberrancy.  Prior to this hospitalization patient was maintained on flecainide-Case was discussed with Dr. Caryl Comes on 5/19-who knows this patient well, he reviewed the chart and recommended that we restart flecainide.  Acute kidney injury: Resolved, suspect this was secondary to hemodynamically mediated kidney injury in the setting of sepsis  Mild hyperkalemia: Resolved  Diabetic ketoacidosis: Resolved  Anemia: Converted to acute illness-hemoglobin stable-follow periodically.  Leukocytosis: Doubt active infection-likely reactive secondary to inflammation/recent surgery.  CT abdomen on 5/16- for infection.  No longer on antimicrobial therapy.  CBC periodically  DM-2: Did have an episode of hypoglycemia on 5/16-following which Lantus dosing was adjusted to 6  units-following which CBGs remained stable.  Follow closely.  History of paroxysmal atrial fibrillation/SVT: Reviewed of prior notes indicate patient not on anticoagulation due to prior history of bleeding-continue metoprolol.  As noted above-Case was discussed with  patient's primary electrophysiologist Dr. Ilda Foil flecainide.  Deconditioning: Secondary to acute illness/prolonged hospitalization-out of bed to chair-encouraged patient to participate in rehab activities-hopefully CIR on discharge when bed available  Procedures/Studies: 5/9>>EXPLORATORY LAPAROTOMY (N/A)LEFT& SIGMOID COLECTOMY WITH COLOSTOMY CREATION (HARTMANN PROCEDURE) (Left) 5/11>>ETT  Discharge Diagnoses:  Principal Problem:   Diverticulitis of both large and small intestine with perforation and abscess without bleeding Active Problems:   Hypertension   Diabetes mellitus ([place])   Atrial fibrillation with RVR ([place])   Colonic diverticular abscess   Wide Complex Tachycardia/H/o SVT and H/o Afib   Sepsis ([place])   Diabetic acidosis without coma ([place])   Discharge Instructions:  Activity:  As tolerated with Full fall precautions use walker/cane & assistance as needed  Discharge Instructions    Diet - low sodium heart healthy   Complete by:  As directed    Increase activity slowly   Complete by:  As directed      Allergies as of 10/10/2017      Reactions   Bee Venom Anaphylaxis   Sulfamethoxazole Nausea And Vomiting   Penicillins Itching, Rash      Sulfonamide Derivatives Nausea And Vomiting      Medication List    STOP taking these medications   AVASTIN 100 MG/4ML Soln Generic drug:  Bevacizumab   insulin regular 100 units/mL injection Commonly known as:  NOVOLIN R,HUMULIN R   lisinopril 5 MG tablet Commonly known as:  PRINIVIL,ZESTRIL     TAKE these medications   aspirin EC 325 MG tablet Take 325 mg by mouth every evening.   brimonidine 0.2 % ophthalmic solution Commonly known as:   ALPHAGAN Place 1 drop into both eyes 3 (three) times daily.   feeding supplement (ENSURE ENLIVE) Liqd Take 237 mLs by mouth 2 (two) times daily between meals. Start taking on:  10/11/2017   flecainide 100 MG tablet Commonly known as:  TAMBOCOR TAKE 1 TABLET BY MOUTH TWICE DAILY   insulin glargine 100 UNIT/ML injection Commonly known as:  LANTUS Inject 0.06 mLs (6 Units total) into the skin every morning. What changed:  how much to take   metoprolol tartrate 25 MG tablet Commonly known as:  LOPRESSOR Take 25 mg by mouth daily as needed (high heart rate).   nitroGLYCERIN 0.4 MG SL tablet Commonly known as:  NITROSTAT Place 0.4 mg under the tongue every 5 (five) minutes x 3 doses as needed for chest pain.   ondansetron 4 MG disintegrating tablet Commonly known as:  ZOFRAN ODT Take 1 tablet (4 mg total) by mouth every 8 (eight) hours as needed. What changed:  reasons to take this   oxyCODONE 5 MG immediate release tablet Commonly known as:  Oxy IR/ROXICODONE Take 1-2 tablets (5-10 mg total) by mouth every 4 (four) hours as needed for moderate pain.   pantoprazole 40 MG tablet Commonly known as:  PROTONIX Take 1 tablet (40 mg total) by mouth at bedtime.   PRESERVISION AREDS 2 Caps Take 1 capsule by mouth 2 (two) times daily.   SYSTANE OP Apply 1 drop to eye 2 (two) times daily.   TUMS E-X SUGAR FREE PO Take 2 tablets by mouth every morning.   VITAMIN B COMPLEX PO Take 1 tablet by mouth daily.   vitamin C 500 MG tablet Commonly known as:  ASCORBIC ACID Take 1,000 mg by mouth at bedtime.   Vitamin D 1000 units capsule Take 1,000 Units by mouth every morning.   vitamin E 1000 UNIT capsule Take 1,000 Units by  mouth at bedtime.      Follow-up Information    Asencion Noble, MD. Schedule an appointment as soon as possible for a visit in 1 week(s).   Specialty:  Internal Medicine Contact information: [address] [place] [address] [phone]          Rosetta Posner, MD. Schedule an appointment as soon as possible for a visit in 1 week(s).   Specialties:  Vascular Surgery, Cardiology Why:  after discharge from [facility] information: [place] [address] [phone]            Consultations:   pulmonary/intensive care and general surgery  Other Procedures/Studies: Ct Abdomen Pelvis W Contrast  Result Date: 10/06/2017 CLINICAL DATA:  History of diverticulitis with perforation and abscess formation, recent colectomy with Hartmann's pouch creation EXAM: CT ABDOMEN AND PELVIS WITH CONTRAST TECHNIQUE: Multidetector CT imaging of the abdomen and pelvis was performed using the standard protocol following bolus administration of intravenous contrast. CONTRAST:  168mL OMNIPAQUE IOHEXOL 300 MG/ML  SOLN COMPARISON:  09/26/2017 FINDINGS: Lower chest: Bibasilar atelectatic changes with small effusions are noted new from the prior exam. Hepatobiliary: Mild fatty infiltration of the liver is noted. Gallbladder is within normal limits. Pancreas: Unremarkable. No pancreatic ductal dilatation or surrounding inflammatory changes. Spleen: Normal in size without focal abnormality. Adrenals/Urinary Tract: Adrenal glands are within normal limits. 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 No obstructive changes are seen. No renal calculi are noted. No focal mass is seen. The bladder is partially decompressed by Foley catheter. Air is noted within the bladder related to the prior instrumentation. Stomach/Bowel: Postoperative changes are now seen with a Hartmann's pouch in the pelvis. Left mid abdominal ostomy is noted. The appendix is not visualized consistent with a prior surgical history. No obstructive changes are noted. Mild small bowel edema is noted likely related to the recent surgery. Vascular/Lymphatic: Aortic atherosclerosis. No enlarged abdominal or pelvic lymph nodes. Reproductive: Uterus and bilateral adnexa are  unremarkable. Other: Small amount of free fluid is noted within the pelvis likely related to the recent surgery. No findings to suggest focal abscess are noted. Musculoskeletal: Degenerative changes of lumbar spine are noted. No acute bony abnormality is seen. Edematous changes are noted in the subcutaneous tissues of the abdominal wall and pelvis. IMPRESSION: Postoperative changes consistent with the given clinical history. No recurrent abscess is identified. Mild free fluid is noted likely postoperative in nature. Mild small bowel edema which may be related to the patient's underlying abdominal pain. No obstructive changes are noted. This is likely related to the recent surgery. Bilateral pleural effusions and bibasilar atelectatic changes new from the prior exam. No other focal abnormality is noted at this time. Electronically Signed   By: Inez Catalina M.D.   On: 10/06/2017 18:16   Ct Abdomen Pelvis W Contrast  Result Date: 09/27/2017 CLINICAL DATA:  77 y/o  F; lower abdominal pain. EXAM: CT ABDOMEN AND PELVIS WITH CONTRAST TECHNIQUE: Multidetector CT imaging of the abdomen and pelvis was performed using the standard protocol following bolus administration of intravenous contrast. CONTRAST:  134mL ISOVUE-300 IOPAMIDOL (ISOVUE-300) INJECTION 61% COMPARISON:  09/23/2017 CT abdomen and pelvis FINDINGS: Lower chest: No acute abnormality. Hepatobiliary: No focal liver abnormality is seen. No gallstones, gallbladder wall thickening, or biliary dilatation. Pancreas: Unremarkable. No pancreatic ductal dilatation or surrounding inflammatory changes. Spleen: Normal in size without focal abnormality. Adrenals/Urinary Tract: Adrenal glands are unremarkable. Kidneys are normal, without renal calculi, focal lesion, or  hydronephrosis. Bladder is unremarkable. Stomach/Bowel: Acute perforated diverticulitis (series 5, image 49) with oblong fluid collection extending between the bladder and the uterus measuring 7.1 cm in length  and 1.8 cm in diameter (series 2, image 67 and series 5, image 45), increased in size in comparison with the prior CT of the abdomen and pelvis. Contrast and debris is present within the perforated collection. Stomach and small bowel are unremarkable. Vascular/Lymphatic: Aortic atherosclerosis. No enlarged abdominal or pelvic lymph nodes. Reproductive: Uterus and bilateral adnexa are unremarkable. Other: No abdominal wall hernia or abnormality. No abdominopelvic ascites. Musculoskeletal: No acute or significant osseous findings. IMPRESSION: Acute perforated diverticulitis. Pericolonic abscess extending between the bladder and uterus has increased in size and surrounding inflammation. Contrast and debris extends into the abscess cavity. Electronically Signed   By: Kristine Garbe M.D.   On: 09/27/2017 00:34   Ct Abdomen Pelvis W Contrast  Result Date: 09/23/2017 CLINICAL DATA:  Abdominal pain, diverticulitis suspected EXAM: CT ABDOMEN AND PELVIS WITH CONTRAST TECHNIQUE: Multidetector CT imaging of the abdomen and pelvis was performed using the standard protocol following bolus administration of intravenous contrast. CONTRAST:  11mL ISOVUE-300 IOPAMIDOL (ISOVUE-300) INJECTION 61% COMPARISON:  CT abdomen/pelvis dated 09/10/2017. FINDINGS: Lower chest: Lung bases are clear. Hepatobiliary: Liver is within normal limits. Gallbladder is unremarkable. No intrahepatic or extrahepatic ductal dilatation. Pancreas: Within normal limits. Spleen: Within normal limits. Adrenals/Urinary Tract: Adrenal glands are within normal limits. Kidneys within normal limits.  No hydronephrosis. Mildly thick-walled bladder. Stomach/Bowel: Stomach is within normal limits. No evidence of bowel obstruction. Prior appendectomy. Sigmoid diverticulosis, with mild pericolonic inflammatory changes in the left lower quadrant, reflecting acute diverticulitis. Associated 18 x 12 mm fluid/gas collection inferior to the sigmoid colon (series  2/image 60), suggesting a small pericolonic abscess, although this does not reflect a drainable fluid collection. No free air. Vascular/Lymphatic: No evidence of abdominal aortic aneurysm. Atherosclerotic calcifications of the abdominal aorta and branch vessels. No suspicious abdominopelvic lymphadenopathy. Reproductive: Uterus is within normal limits. Bilateral ovaries are within normal limits. Other: No abdominopelvic ascites. Musculoskeletal: Mild degenerative changes at L5-S1. IMPRESSION: Acute diverticulitis. 1.8 cm fluid/gas collection adjacent to the sigmoid colon, suggesting a small pericolonic abscess, although this does not reflect a drainable collection. No free air. Electronically Signed   By: Julian Hy M.D.   On: 09/23/2017 20:51   Ct Abdomen Pelvis W Contrast  Result Date: 09/18/2017 CLINICAL DATA:  Left lower abdomen and suprapubic pain beginning this morning. EXAM: CT ABDOMEN AND PELVIS WITH CONTRAST TECHNIQUE: Multidetector CT imaging of the abdomen and pelvis was performed using the standard protocol following bolus administration of intravenous contrast. CONTRAST:  160mL ISOVUE-300 IOPAMIDOL (ISOVUE-300) INJECTION 61% COMPARISON:  11/30/2011 FINDINGS: Lower chest: No acute abnormality. Hepatobiliary: No focal liver abnormality is seen. No gallstones, gallbladder wall thickening, or biliary dilatation. Pancreas: Unremarkable. No pancreatic ductal dilatation or surrounding inflammatory changes. Spleen: Normal in size without focal abnormality. Adrenals/Urinary Tract: Adrenal glands are unremarkable. Kidneys are normal, without renal calculi, focal lesion, or hydronephrosis. Bladder is unremarkable. Stomach/Bowel: Stomach and small bowel unremarkable. There are numerous left colon diverticula. No diverticulitis or other colonic inflammatory process. Vascular/Lymphatic: There is prominent aortic atherosclerosis extending to the iliac vessels. No pathologically enlarged lymph nodes.  Reproductive: Uterus and bilateral adnexa are unremarkable. Other: No abdominal wall hernia or abnormality. No abdominopelvic ascites. Musculoskeletal: No fracture or acute finding. No osteoblastic or osteolytic lesions. IMPRESSION: 1. No acute findings. 2. Numerous left colon diverticula. No CT evidence of diverticulitis. 3. Fairly  extensive aortic atherosclerosis. Electronically Signed   By: Lajean Manes M.D.   On: 09/18/2017 20:49   Dg Chest Port 1 View  Result Date: 10/04/2017 CLINICAL DATA:  History of endotracheal tube EXAM: PORTABLE CHEST 1 VIEW COMPARISON:  Yesterday FINDINGS: Endotracheal tube tip at the clavicular heads. An orogastric tube reaches the stomach. Right upper extremity PICC with tip at the upper SVC. Diffuse interstitial coarsening with perihilar indistinct opacity. Haziness at the right base attributed to small pleural effusion. Normal heart size. No pneumothorax. IMPRESSION: 1. Perihilar opacity likely reflecting pneumonia. No definite change from yesterday. 2. Stable hardware positioning. Electronically Signed   By: Monte Fantasia M.D.   On: 10/04/2017 07:50   Dg Chest Port 1 View  Result Date: 10/03/2017 CLINICAL DATA:  Short of breath EXAM: PORTABLE CHEST 1 VIEW COMPARISON:  10/02/2017 FINDINGS: Endotracheal tube 4 cm above the carina. Right arm PICC tip in the SVC. NG tube in the stomach Diffuse bilateral airspace disease unchanged. Small pleural effusions. No pneumothorax IMPRESSION: Support lines remain in good position. Diffuse bilateral airspace disease unchanged from yesterday Electronically Signed   By: Franchot Gallo M.D.   On: 10/03/2017 07:11   Dg Chest Port 1 View  Result Date: 10/02/2017 CLINICAL DATA:  Intubation EXAM: PORTABLE CHEST 1 VIEW COMPARISON:  Portable exam 1109 hours compared to 10/01/2017 FINDINGS: Tip of endotracheal tube projects 3.1 cm above carina. Nasogastric tube extends into stomach. RIGHT arm PICC line tip projects over SVC. Upper normal  heart size. Extensive BILATERAL pulmonary airspace infiltrates unchanged. Probable tiny LEFT pleural effusion. No pneumothorax. Bones demineralized. IMPRESSION: Persistent severe diffuse BILATERAL airspace infiltrates. Electronically Signed   By: Lavonia Dana M.D.   On: 10/02/2017 11:26   Dg Chest Port 1 View  Result Date: 10/01/2017 CLINICAL DATA:  Verify endotracheal tube. EXAM: PORTABLE CHEST 1 VIEW COMPARISON:  10/01/2017 FINDINGS: Endotracheal tube is in place with tip approximately 2.9 centimeters above the carina. Nasogastric tube is in place, tip overlying the level of the stomach and side-port in the region the gastroesophageal junction. A RIGHT-sided PICC line tip overlies the superior vena cava. Heart size is normal. There are airspace filling opacities bilaterally with central air bronchograms. There are small bilateral pleural effusions. IMPRESSION: 1. Interval placement of endotracheal tube, tip to be in good position. 2. Persistent bilateral airspace filling opacities and pleural effusions, similar to prior study. Electronically Signed   By: Nolon Nations M.D.   On: 10/01/2017 13:35   Dg Chest Port 1 View  Result Date: 10/01/2017 CLINICAL DATA:  Respiratory distress.  Follow-up. EXAM: PORTABLE CHEST 1 VIEW COMPARISON:  09/30/2017 FINDINGS: Nasogastric tube enters the abdomen. Right arm PICC tip is in the SVC above the right atrium. Widespread pulmonary opacity has worsened, consistent with progressive edema. Small effusions and dependent atelectasis. IMPRESSION: Progressive/worsened diffuse edema pattern. Electronically Signed   By: Nelson Chimes M.D.   On: 10/01/2017 11:33   Dg Chest Port 1 View  Result Date: 09/30/2017 CLINICAL DATA:  Shortness of breath EXAM: PORTABLE CHEST 1 VIEW COMPARISON:  02/20/2014 FINDINGS: An orogastric tube reaches the stomach. Right upper extremity PICC with tip at the upper SVC. Patchy bilateral airspace opacity. Streaky opacities at the bases with probable  small effusions. Normal heart size for technique. No pneumothorax. IMPRESSION: 1. Patchy bilateral lung opacity primarily concerning for pneumonia. 2. There is low volumes with streaky opacity, likely superimposed atelectasis. Electronically Signed   By: Monte Fantasia M.D.   On: 09/30/2017 11:32  Dg Abd Portable 1v  Result Date: 10/05/2017 CLINICAL DATA:  Pain. Pt had abdominal surgery on 09/29/17. EXAM: PORTABLE ABDOMEN - 1 VIEW COMPARISON:  CT abdomen dated 09/26/2017. FINDINGS: Interval colostomy creation, LEFT abdomen. Overall bowel gas pattern is nonobstructive. No evidence of soft tissue mass or abnormal fluid collections seen. No acute or suspicious osseous finding. IMPRESSION: LEFT abdominal colostomy.  Nonobstructive bowel gas pattern. Electronically Signed   By: Franki Cabot M.D.   On: 10/05/2017 21:42     TODAY-DAY OF DISCHARGE:  Subjective:   Barbara [name] today has no headache,no chest abdominal pain,no new weakness tingling or numbness, feels much better  Objective:   Blood pressure (!) 143/48, pulse 74, temperature 97.6 F (36.4 C), temperature source Oral, resp. rate 16, height 5\' 3"  (1.6 m), weight 66.3 kg (146 lb 2.6 oz), SpO2 98 %.  Intake/Output Summary (Last 24 hours) at 10/10/2017 1458 Last data filed at 10/10/2017 0900 Gross per 24 hour  Intake 189.83 ml  Output 800 ml  Net -610.17 ml   Filed Weights   10/08/17 0948 10/09/17 0500 10/10/17 0608  Weight: 67.1 kg (147 lb 14.4 oz) 66.5 kg (146 lb 9.7 oz) 66.3 kg (146 lb 2.6 oz)    Exam: Awake Alert, Oriented *3, No new F.N deficits, Normal affect [place].AT,PERRAL Supple Neck,No JVD, No cervical lymphadenopathy appriciated.  Symmetrical Chest wall movement, Good air movement bilaterally, CTAB RRR,No Gallops,Rubs or new Murmurs, No Parasternal Heave +ve B.Sounds, Abd Soft, Non tender, No organomegaly appriciated, No rebound -guarding or rigidity. No Cyanosis, Clubbing or edema, No new Rash or bruise   PERTINENT  RADIOLOGIC STUDIES: Ct Abdomen Pelvis W Contrast  Result Date: 10/06/2017 CLINICAL DATA:  History of diverticulitis with perforation and abscess formation, recent colectomy with Hartmann's pouch creation EXAM: CT ABDOMEN AND PELVIS WITH CONTRAST TECHNIQUE: Multidetector CT imaging of the abdomen and pelvis was performed using the standard protocol following bolus administration of intravenous contrast. CONTRAST:  128mL OMNIPAQUE IOHEXOL 300 MG/ML  SOLN COMPARISON:  09/26/2017 FINDINGS: Lower chest: Bibasilar atelectatic changes with small effusions are noted new from the prior exam. Hepatobiliary: Mild fatty infiltration of the liver is noted. Gallbladder is within normal limits. Pancreas: Unremarkable. No pancreatic ductal dilatation or surrounding inflammatory changes. Spleen: Normal in size without focal abnormality. Adrenals/Urinary Tract: Adrenal glands are within normal limits. 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 No obstructive changes are seen. No renal calculi are noted. No focal mass is seen. The bladder is partially decompressed by Foley catheter. Air is noted within the bladder related to the prior instrumentation. Stomach/Bowel: Postoperative changes are now seen with a Hartmann's pouch in the pelvis. Left mid abdominal ostomy is noted. The appendix is not visualized consistent with a prior surgical history. No obstructive changes are noted. Mild small bowel edema is noted likely related to the recent surgery. Vascular/Lymphatic: Aortic atherosclerosis. No enlarged abdominal or pelvic lymph nodes. Reproductive: Uterus and bilateral adnexa are unremarkable. Other: Small amount of free fluid is noted within the pelvis likely related to the recent surgery. No findings to suggest focal abscess are noted. Musculoskeletal: Degenerative changes of lumbar spine are noted. No acute bony abnormality is seen. Edematous changes are noted in the subcutaneous tissues of the abdominal  wall and pelvis. IMPRESSION: Postoperative changes consistent with the given clinical history. No recurrent abscess is identified. Mild free fluid is noted likely postoperative in nature. Mild small bowel edema which may be related to the patient's underlying abdominal pain. No  obstructive changes are noted. This is likely related to the recent surgery. Bilateral pleural effusions and bibasilar atelectatic changes new from the prior exam. No other focal abnormality is noted at this time. Electronically Signed   By: Inez Catalina M.D.   On: 10/06/2017 18:16   Ct Abdomen Pelvis W Contrast  Result Date: 09/27/2017 CLINICAL DATA:  77 y/o  F; lower abdominal pain. EXAM: CT ABDOMEN AND PELVIS WITH CONTRAST TECHNIQUE: Multidetector CT imaging of the abdomen and pelvis was performed using the standard protocol following bolus administration of intravenous contrast. CONTRAST:  127mL ISOVUE-300 IOPAMIDOL (ISOVUE-300) INJECTION 61% COMPARISON:  09/23/2017 CT abdomen and pelvis FINDINGS: Lower chest: No acute abnormality. Hepatobiliary: No focal liver abnormality is seen. No gallstones, gallbladder wall thickening, or biliary dilatation. Pancreas: Unremarkable. No pancreatic ductal dilatation or surrounding inflammatory changes. Spleen: Normal in size without focal abnormality. Adrenals/Urinary Tract: Adrenal glands are unremarkable. Kidneys are normal, without renal calculi, focal lesion, or hydronephrosis. Bladder is unremarkable. Stomach/Bowel: Acute perforated diverticulitis (series 5, image 49) with oblong fluid collection extending between the bladder and the uterus measuring 7.1 cm in length and 1.8 cm in diameter (series 2, image 67 and series 5, image 45), increased in size in comparison with the prior CT of the abdomen and pelvis. Contrast and debris is present within the perforated collection. Stomach and small bowel are unremarkable. Vascular/Lymphatic: Aortic atherosclerosis. No enlarged abdominal or pelvic lymph  nodes. Reproductive: Uterus and bilateral adnexa are unremarkable. Other: No abdominal wall hernia or abnormality. No abdominopelvic ascites. Musculoskeletal: No acute or significant osseous findings. IMPRESSION: Acute perforated diverticulitis. Pericolonic abscess extending between the bladder and uterus has increased in size and surrounding inflammation. Contrast and debris extends into the abscess cavity. Electronically Signed   By: Kristine Garbe M.D.   On: 09/27/2017 00:34   Ct Abdomen Pelvis W Contrast  Result Date: 09/23/2017 CLINICAL DATA:  Abdominal pain, diverticulitis suspected EXAM: CT ABDOMEN AND PELVIS WITH CONTRAST TECHNIQUE: Multidetector CT imaging of the abdomen and pelvis was performed using the standard protocol following bolus administration of intravenous contrast. CONTRAST:  143mL ISOVUE-300 IOPAMIDOL (ISOVUE-300) INJECTION 61% COMPARISON:  CT abdomen/pelvis dated 09/10/2017. FINDINGS: Lower chest: Lung bases are clear. Hepatobiliary: Liver is within normal limits. Gallbladder is unremarkable. No intrahepatic or extrahepatic ductal dilatation. Pancreas: Within normal limits. Spleen: Within normal limits. Adrenals/Urinary Tract: Adrenal glands are within normal limits. Kidneys within normal limits.  No hydronephrosis. Mildly thick-walled bladder. Stomach/Bowel: Stomach is within normal limits. No evidence of bowel obstruction. Prior appendectomy. Sigmoid diverticulosis, with mild pericolonic inflammatory changes in the left lower quadrant, reflecting acute diverticulitis. Associated 18 x 12 mm fluid/gas collection inferior to the sigmoid colon (series 2/image 60), suggesting a small pericolonic abscess, although this does not reflect a drainable fluid collection. No free air. Vascular/Lymphatic: No evidence of abdominal aortic aneurysm. Atherosclerotic calcifications of the abdominal aorta and branch vessels. No suspicious abdominopelvic lymphadenopathy. Reproductive: Uterus is  within normal limits. Bilateral ovaries are within normal limits. Other: No abdominopelvic ascites. Musculoskeletal: Mild degenerative changes at L5-S1. IMPRESSION: Acute diverticulitis. 1.8 cm fluid/gas collection adjacent to the sigmoid colon, suggesting a small pericolonic abscess, although this does not reflect a drainable collection. No free air. Electronically Signed   By: Julian Hy M.D.   On: 09/23/2017 20:51   Ct Abdomen Pelvis W Contrast  Result Date: 09/18/2017 CLINICAL DATA:  Left lower abdomen and suprapubic pain beginning this morning. EXAM: CT ABDOMEN AND PELVIS WITH CONTRAST TECHNIQUE: Multidetector CT imaging of the  abdomen and pelvis was performed using the standard protocol following bolus administration of intravenous contrast. CONTRAST:  162mL ISOVUE-300 IOPAMIDOL (ISOVUE-300) INJECTION 61% COMPARISON:  11/30/2011 FINDINGS: Lower chest: No acute abnormality. Hepatobiliary: No focal liver abnormality is seen. No gallstones, gallbladder wall thickening, or biliary dilatation. Pancreas: Unremarkable. No pancreatic ductal dilatation or surrounding inflammatory changes. Spleen: Normal in size without focal abnormality. Adrenals/Urinary Tract: Adrenal glands are unremarkable. Kidneys are normal, without renal calculi, focal lesion, or hydronephrosis. Bladder is unremarkable. Stomach/Bowel: Stomach and small bowel unremarkable. There are numerous left colon diverticula. No diverticulitis or other colonic inflammatory process. Vascular/Lymphatic: There is prominent aortic atherosclerosis extending to the iliac vessels. No pathologically enlarged lymph nodes. Reproductive: Uterus and bilateral adnexa are unremarkable. Other: No abdominal wall hernia or abnormality. No abdominopelvic ascites. Musculoskeletal: No fracture or acute finding. No osteoblastic or osteolytic lesions. IMPRESSION: 1. No acute findings. 2. Numerous left colon diverticula. No CT evidence of diverticulitis. 3. Fairly  extensive aortic atherosclerosis. Electronically Signed   By: Lajean Manes M.D.   On: 09/18/2017 20:49   Dg Chest Port 1 View  Result Date: 10/04/2017 CLINICAL DATA:  History of endotracheal tube EXAM: PORTABLE CHEST 1 VIEW COMPARISON:  Yesterday FINDINGS: Endotracheal tube tip at the clavicular heads. An orogastric tube reaches the stomach. Right upper extremity PICC with tip at the upper SVC. Diffuse interstitial coarsening with perihilar indistinct opacity. Haziness at the right base attributed to small pleural effusion. Normal heart size. No pneumothorax. IMPRESSION: 1. Perihilar opacity likely reflecting pneumonia. No definite change from yesterday. 2. Stable hardware positioning. Electronically Signed   By: Monte Fantasia M.D.   On: 10/04/2017 07:50   Dg Chest Port 1 View  Result Date: 10/03/2017 CLINICAL DATA:  Short of breath EXAM: PORTABLE CHEST 1 VIEW COMPARISON:  10/02/2017 FINDINGS: Endotracheal tube 4 cm above the carina. Right arm PICC tip in the SVC. NG tube in the stomach Diffuse bilateral airspace disease unchanged. Small pleural effusions. No pneumothorax IMPRESSION: Support lines remain in good position. Diffuse bilateral airspace disease unchanged from yesterday Electronically Signed   By: Franchot Gallo M.D.   On: 10/03/2017 07:11   Dg Chest Port 1 View  Result Date: 10/02/2017 CLINICAL DATA:  Intubation EXAM: PORTABLE CHEST 1 VIEW COMPARISON:  Portable exam 1109 hours compared to 10/01/2017 FINDINGS: Tip of endotracheal tube projects 3.1 cm above carina. Nasogastric tube extends into stomach. RIGHT arm PICC line tip projects over SVC. Upper normal heart size. Extensive BILATERAL pulmonary airspace infiltrates unchanged. Probable tiny LEFT pleural effusion. No pneumothorax. Bones demineralized. IMPRESSION: Persistent severe diffuse BILATERAL airspace infiltrates. Electronically Signed   By: Lavonia Dana M.D.   On: 10/02/2017 11:26   Dg Chest Port 1 View  Result Date:  10/01/2017 CLINICAL DATA:  Verify endotracheal tube. EXAM: PORTABLE CHEST 1 VIEW COMPARISON:  10/01/2017 FINDINGS: Endotracheal tube is in place with tip approximately 2.9 centimeters above the carina. Nasogastric tube is in place, tip overlying the level of the stomach and side-port in the region the gastroesophageal junction. A RIGHT-sided PICC line tip overlies the superior vena cava. Heart size is normal. There are airspace filling opacities bilaterally with central air bronchograms. There are small bilateral pleural effusions. IMPRESSION: 1. Interval placement of endotracheal tube, tip to be in good position. 2. Persistent bilateral airspace filling opacities and pleural effusions, similar to prior study. Electronically Signed   By: Nolon Nations M.D.   On: 10/01/2017 13:35   Dg Chest Port 1 View  Result Date: 10/01/2017 CLINICAL  DATA:  Respiratory distress.  Follow-up. EXAM: PORTABLE CHEST 1 VIEW COMPARISON:  09/30/2017 FINDINGS: Nasogastric tube enters the abdomen. Right arm PICC tip is in the SVC above the right atrium. Widespread pulmonary opacity has worsened, consistent with progressive edema. Small effusions and dependent atelectasis. IMPRESSION: Progressive/worsened diffuse edema pattern. Electronically Signed   By: Nelson Chimes M.D.   On: 10/01/2017 11:33   Dg Chest Port 1 View  Result Date: 09/30/2017 CLINICAL DATA:  Shortness of breath EXAM: PORTABLE CHEST 1 VIEW COMPARISON:  02/20/2014 FINDINGS: An orogastric tube reaches the stomach. Right upper extremity PICC with tip at the upper SVC. Patchy bilateral airspace opacity. Streaky opacities at the bases with probable small effusions. Normal heart size for technique. No pneumothorax. IMPRESSION: 1. Patchy bilateral lung opacity primarily concerning for pneumonia. 2. There is low volumes with streaky opacity, likely superimposed atelectasis. Electronically Signed   By: Monte Fantasia M.D.   On: 09/30/2017 11:32   Dg Abd Portable  1v  Result Date: 10/05/2017 CLINICAL DATA:  Pain. Pt had abdominal surgery on 09/29/17. EXAM: PORTABLE ABDOMEN - 1 VIEW COMPARISON:  CT abdomen dated 09/26/2017. FINDINGS: Interval colostomy creation, LEFT abdomen. Overall bowel gas pattern is nonobstructive. No evidence of soft tissue mass or abnormal fluid collections seen. No acute or suspicious osseous finding. IMPRESSION: LEFT abdominal colostomy.  Nonobstructive bowel gas pattern. Electronically Signed   By: Franki Cabot M.D.   On: 10/05/2017 21:42     PERTINENT LAB RESULTS: CBC: Recent Labs    10/10/17 0445  WBC 13.3*  HGB 9.6*  HCT 29.6*  PLT 437*   CMET CMP     Component Value Date/Time   NA 135 10/10/2017 0445   K 4.2 10/10/2017 0445   CL 94 (L) 10/10/2017 0445   CO2 29 10/10/2017 0445   GLUCOSE 214 (H) 10/10/2017 0445   BUN 6 10/10/2017 0445   CREATININE 0.90 10/10/2017 0445   CREATININE 0.97 (H) 07/28/2015 1334   CALCIUM 8.0 (L) 10/10/2017 0445   PROT 4.4 (L) 10/04/2017 0545   ALBUMIN 1.3 (L) 10/04/2017 0545   AST 61 (H) 10/04/2017 0545   ALT 26 10/04/2017 0545   ALKPHOS 75 10/04/2017 0545   BILITOT 0.3 10/04/2017 0545   GFRNONAA >60 10/10/2017 0445   GFRAA >60 10/10/2017 0445    GFR Estimated Creatinine Clearance: 50.2 mL/min (by C-G formula based on SCr of 0.9 mg/dL). No results for input(s): LIPASE, AMYLASE in the last 72 hours. No results for input(s): CKTOTAL, CKMB, CKMBINDEX, TROPONINI in the last 72 hours. Invalid input(s): POCBNP No results for input(s): DDIMER in the last 72 hours. No results for input(s): HGBA1C in the last 72 hours. No results for input(s): CHOL, HDL, LDLCALC, TRIG, CHOLHDL, LDLDIRECT in the last 72 hours. No results for input(s): TSH, T4TOTAL, T3FREE, THYROIDAB in the last 72 hours.  Invalid input(s): FREET3 No results for input(s): VITAMINB12, FOLATE, FERRITIN, TIBC, IRON, RETICCTPCT in the last 72 hours. Coags: No results for input(s): INR in the last 72 hours.  Invalid  input(s): PT Microbiology: No results found for this or any previous visit (from the past 240 hour(s)).  FURTHER DISCHARGE INSTRUCTIONS:  Get Medicines reviewed and adjusted: Please take all your medications with you for your next visit with your Primary MD  Laboratory/radiological data: Please request your Primary MD to go over all hospital tests and procedure/radiological results at the follow up, please ask your Primary MD to get all Hospital records sent to his/her office.  In some  cases, they will be blood work, cultures and biopsy results pending at the time of your discharge. Please request that your primary care M.D. goes through all the records of your hospital data and follows up on these results.  Also Note the following: If you experience worsening of your admission symptoms, develop shortness of breath, life threatening emergency, suicidal or homicidal thoughts you must seek medical attention immediately by calling 911 or calling your MD immediately  if symptoms less severe.  You must read complete instructions/literature along with all the possible adverse reactions/side effects for all the Medicines you take and that have been prescribed to you. Take any new Medicines after you have completely understood and accpet all the possible adverse reactions/side effects.   Do not drive when taking Pain medications or sleeping medications (Benzodaizepines)  Do not take more than prescribed Pain, Sleep and Anxiety Medications. It is not advisable to combine anxiety,sleep and pain medications without talking with your primary care practitioner  Special Instructions: If you have smoked or chewed Tobacco  in the last 2 yrs please stop smoking, stop any regular Alcohol  and or any Recreational drug use.  Wear Seat belts while driving.  Please note: You were cared for by a hospitalist during your hospital stay. Once you are discharged, your primary care physician will handle any further  medical issues. Please note that NO REFILLS for any discharge medications will be authorized once you are discharged, as it is imperative that you return to your primary care physician (or establish a relationship with a primary care physician if you do not have one) for your post hospital discharge needs so that they can reassess your need for medications and monitor your lab values.  Total Time spent coordinating discharge including counseling, education and face to face time equals 35 minutes.  SignedOren Binet 10/10/2017 2:58 PM

## 2017-10-10 NOTE — Progress Notes (Signed)
11 Days Post-Op    CC: abdominal pain and loose stools  Subjective: She has the wound vac and ostomy bag off, both look good.  She is being considered for CIR.  Tolerating soft diet.  Daughter is with her.    Objective: Vital signs in last 24 hours: Temp:  [97.6 F (36.4 C)-99.2 F (37.3 C)] 97.6 F (36.4 C) (05/20 4854) Pulse Rate:  [67-74] 74 (05/20 0819) Resp:  [16] 16 (05/20 0608) BP: (128-149)/(44-54) 143/48 (05/20 0819) SpO2:  [94 %-98 %] 98 % (05/20 0608) Weight:  [66.3 kg (146 lb 2.6 oz)] 66.3 kg (146 lb 2.6 oz) (05/20 6270) Last BM Date: 10/07/17 PO not recorded Urine 750 recorded Afebrile, VSS Glucose 214 Intake/Output from previous day: 05/19 0701 - 05/20 0700 In: 189.8 [I.V.:189.8] Out: 750 [Urine:750] Intake/Output this shift: No intake/output data recorded.  General appearance: alert, cooperative and no distress Resp: clear to auscultation bilaterally GI: open wound is looking fine and picture is below.  ostomy is working and looks fine.        Lab Results:  Recent Labs    10/10/17 0445  WBC 13.3*  HGB 9.6*  HCT 29.6*  PLT 437*    BMET Recent Labs    10/08/17 0825 10/10/17 0445  NA 131* 135  K 4.3 4.2  CL 92* 94*  CO2 29 29  GLUCOSE 247* 214*  BUN 18 6  CREATININE 1.07* 0.90  CALCIUM 7.8* 8.0*   PT/INR No results for input(s): LABPROT, INR in the last 72 hours.  Recent Labs  Lab 10/04/17 0545  AST 61*  ALT 26  ALKPHOS 75  BILITOT 0.3  PROT 4.4*  ALBUMIN 1.3*     Lipase     Component Value Date/Time   LIPASE 19 10/01/2017 1135     Medications: . aspirin  81 mg Oral Daily  . brimonidine  1 drop Both Eyes TID  . chlorhexidine  15 mL Mouth Rinse BID  . Chlorhexidine Gluconate Cloth  6 each Topical Daily  . feeding supplement (ENSURE ENLIVE)  237 mL Oral BID BM  . flecainide  100 mg Oral BID  . heparin injection (subcutaneous)  5,000 Units Subcutaneous Q8H  . insulin aspart  0-9 Units Subcutaneous TID WC  . insulin  glargine  6 Units Subcutaneous QHS  . mouth rinse  15 mL Mouth Rinse q12n4p  . metoprolol tartrate  12.5 mg Oral BID  . pantoprazole  40 mg Oral QHS   . dextrose 5 % and 0.45% NaCl 10 mL/hr at 10/09/17 0901   Anti-infectives (From admission, onward)   Start     Dose/Rate Route Frequency Ordered Stop   10/02/17 1800  meropenem (MERREM) 1 g in sodium chloride 0.9 % 100 mL IVPB  Status:  Discontinued     1 g 200 mL/hr over 30 Minutes Intravenous Every 8 hours 10/02/17 1043 10/07/17 1311   09/27/17 1200  meropenem (MERREM) 1 g in sodium chloride 0.9 % 100 mL IVPB  Status:  Discontinued     1 g 200 mL/hr over 30 Minutes Intravenous Every 12 hours 09/27/17 1032 10/02/17 1043   09/27/17 1000  ciprofloxacin (CIPRO) IVPB 400 mg  Status:  Discontinued     400 mg 200 mL/hr over 60 Minutes Intravenous Every 12 hours 09/27/17 0547 09/27/17 1029   09/27/17 0800  metroNIDAZOLE (FLAGYL) IVPB 500 mg  Status:  Discontinued     500 mg 100 mL/hr over 60 Minutes Intravenous Every 8 hours 09/27/17 0547  09/27/17 1030   09/26/17 2315  ciprofloxacin (CIPRO) IVPB 400 mg     400 mg 200 mL/hr over 60 Minutes Intravenous  Once 09/26/17 2304 09/27/17 0032   09/26/17 2315  metroNIDAZOLE (FLAGYL) IVPB 500 mg     500 mg 100 mL/hr over 60 Minutes Intravenous  Once 09/26/17 2304 09/27/17 0229      Assessment/Plan Septic shock secondary to perforated diverticulitis 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 Redmond Pulling -POD11 -path shows diverticulitis -cont wound VAC changes MWF -No evidence of abscess on CT 5/16  SVT, s/p cardioversion  AKI -improving, Cr 0.90  Hypotension Improved  Respiratory Failure, VDRF/Tobacco abuse Extubated and doing well  CVA HTN hypoerlipidemia DM Chronic back pain  FEN - soft diet, Ensure VTE - SCDs/heparin ID - Merrem 5/7 >> 5/17  Plan:  She isn't sure she wants to go home with Wound Vac, both Dawn, Wound Care nurse  and I encourage her to keep the wound vac as long as she is here and she is agreeable.  She is OK for CIR from our standpoint, Carb Mod diet.        LOS: 13 days    Hamsini Verrilli 10/10/2017 [phone]

## 2017-10-10 NOTE — Consult Note (Addendum)
[place] Nurse wound follow-up consult note Reason for Consult:Surgical team following for assessment and plan of care for abd wound, Will, PA at the bedside to assess wound appearance. Wound type:Full thickness post-op wound to midline abd; Vac dressing change performed. Wound UJW:JXBJY red 16X4X1.3cm Drainage (amount, consistency, odor)small amt tan drainage in the cannister, no odor, cannister changed Periwound:intact skin surrounding Dressing procedure/placement/frequency:Applied one piece black foam to 197mm cont suction. Pt medicated for pain prior to the procedure and tolerated with mod amt discomfort. [place] team will plan to change again Q M/W/F. Ostomy pouch is in close proximity and will need to be changed with each Vac dressing. Daughter at the bedside to assess wound appearance and discuss plan of care during Vac application.  [place] Nurse ostomy follow up Stoma type/location: Colostomy stoma is red and viable, above skin level, 1 1/4 inches and slightly oval Peristomal assessment: intact skin surrounding Output: very small amt brown unformed stool Ostomy pouching: 2pc.  Education provided:  Demonstrated pouch change to patient and daughter at the bedside.  Pt did not watch the procedure or participate.  Daughter watched the process and asked appropriate questions. Reviewed obtaining supplies and pouching routines.  Supplies and educational materials left at the bedside. Enrolled patient in [place] Start Discharge program: Yes [place] team will continue to follow for further teaching sessions while in the hospital.  She will need home health assistance when discharged with a Vac dressing. Julien Girt MSN, RN, [place], [place], [place]

## 2017-10-10 NOTE — Progress Notes (Signed)
Inpatient Rehabilitation  I have insurance approval for IP Rehab and have a bed to offer today.  I have received acute medical clearance and patient is in agreement with plan.  Plan to proceed with admission.  Team updated.  Call if questions .  Carmelia Roller., CCC/SLP Admission Coordinator  [place]  Cell [phone]

## 2017-10-10 NOTE — H&P (Signed)
Physical Medicine and Rehabilitation Admission H&P    Chief Complaint  Patient presents with  . Abdominal Pain  : HPI: Barbara [name] is a 77 year old right-handed female with history of diabetes mellitus, hypertension, PAF with history of GI bleed, chronic low back pain with radiculopathy bilateral lower extremities and recent admission for diverticulitis with abscess and discharged home on 09/25/2017.  Per chart review patient lives with daughter.  Independent prior to admission.  She was still driving doing her grocery shopping.  One level home with 5 steps to entry.  Presented to [facility] on 09/26/2017 with increasing abdominal pain as well as nausea vomiting and diarrhea.  Developed SVT requiring synchronized cardioversion but developed hypotension.  She was started on amiodarone and transferred to [facility] for management.  Supportive management recommended by EP and has been maintained on Tambocor.  Due to sepsis with diffuse peritonitis, surgical intervention recommended and underwent exploratory laparotomy with left and sigmoid colectomy colostomy by Dr. Redmond Pulling.  She tolerated extubation without difficulty but needed reintubation 10/02/2017 due to increasing shortness of breath and lethargy with delirium.  Postoperative course significant for intermittent hypotension with prolonged ileus, electrolyte abnormalities and progressive leukocytosis.  She was later extubated 10/04/2017.  A follow-up CT of the abdomen and pelvis completed showing no recurrent abscess identified.  No obstructive changes.  Subcutaneous heparin for DVT prophylaxis.  Physical and occupational therapy evaluations completed with recommendations of physical medicine rehab consult.  Patient was admitted for a comprehensive rehab program.  Review of Systems  Constitutional: Negative for chills and fever.  HENT: Negative for hearing loss.   Eyes: Negative for blurred vision and double vision.  Respiratory:  Negative for cough.   Cardiovascular: Negative for leg swelling.  Gastrointestinal: Positive for abdominal pain.       GERD  Genitourinary: Negative for flank pain, hematuria and urgency.  Musculoskeletal: Positive for back pain and myalgias.  Neurological: Positive for focal weakness.  Psychiatric/Behavioral: Positive for memory loss.  All other systems reviewed and are negative.  Past Medical History:  Diagnosis Date  . Arteriosclerotic cardiovascular disease (ASCVD)    non-obstructive; negative stress nuclear and normal echo in 08/2009 [facility] cardiac [facility] in 03/2000-20%  main 20% LAD, 30% circumflex, 30% RCA, hyperdynamic LV  . Asthma    Mild  . Chest pain   . Chronic back pain   . Diabetes mellitus    A1c of 7.6 in 04/2010; moderate to high dose insulin  . GERD (gastroesophageal reflux disease)   . Hyperlipidemia    total cholesterol of 271 and LDL of 187 in 07/2010  . Hypertension   . Macular degeneration   . Palpitations   . Stroke [facility]) 1993 2008   short term memory loss  . Tobacco abuse    Past Surgical History:  Procedure Laterality Date  . APPENDECTOMY    . CATARACT EXTRACTION     left  . COLECTOMY WITH COLOSTOMY CREATION/HARTMANN PROCEDURE Left 09/29/2017   Procedure: EXTENDED LEFT HEMI COLECTOMY WITH COLOSTOMY CREATION (HARTMANN PROCEDURE);  Surgeon: Greer Pickerel, MD;  Location: [place];  Service: General;  Laterality: Left;  . COLONOSCOPY  2009  . COLONOSCOPY  03/30/2012   Procedure: COLONOSCOPY;  Surgeon: Rogene Houston, MD;  [facility];  Service: Endoscopy;  Laterality: N/A;  730  . COLONOSCOPY N/A 12/13/2013   Procedure: COLONOSCOPY;  Surgeon: Rogene Houston, MD;  [facility];  Service: Endoscopy;  Laterality: N/A;  1200  . ESOPHAGOGASTRODUODENOSCOPY  10/21/2011   Procedure: ESOPHAGOGASTRODUODENOSCOPY (EGD);  Surgeon: Rogene Houston, MD;  [facility];  Service: Endoscopy;  Laterality: N/A;  1200  .  ESOPHAGOGASTRODUODENOSCOPY N/A 12/13/2013   Procedure: ESOPHAGOGASTRODUODENOSCOPY (EGD);  Surgeon: Rogene Houston, MD;  [facility];  Service: Endoscopy;  Laterality: N/A;  . LAPAROTOMY N/A 09/29/2017   Procedure: EXPLORATORY LAPAROTOMY;  Surgeon: Greer Pickerel, MD;  Location: [place];  Service: General;  Laterality: N/A;  . TUBAL LIGATION     Family History  Problem Relation Age of Onset  . Heart attack Mother   . Cancer Father   . Cancer Sister   . Heart attack Brother   . Cancer Brother   . Cancer Sister   . Cancer Sister    Social History:  reports that she has been smoking cigarettes.  She has a 25.00 pack-year smoking history. She has never used smokeless tobacco. She reports that she does not drink alcohol or use drugs. Allergies:  Allergies  Allergen Reactions  . Bee Venom Anaphylaxis  . Sulfamethoxazole Nausea And Vomiting  . Penicillins Itching and Rash    Has patient had a PCN reaction causing immediate rash, facial/tongue/throat swelling, SOB or lightheadedness with hypotension: No Has patient had a PCN reaction causing severe rash involving mucus membranes or skin necrosis: {Yes/No:[id number] yes a rash Has patient had a PCN reaction that required hospitalization No Has patient had a PCN reaction occurring within the last 10 years: No If all of the above answers are "NO", then may proceed with Cephalo  . Sulfonamide Derivatives Nausea And Vomiting   Medications Prior to Admission  Medication Sig Dispense Refill  . aspirin EC 325 MG tablet Take 325 mg by mouth every evening.    . B Complex Vitamins (VITAMIN B COMPLEX PO) Take 1 tablet by mouth daily.    . Bevacizumab (AVASTIN) 100 MG/4ML SOLN Inject 1.25 mg into the vein every 8 (eight) weeks.    . brimonidine (ALPHAGAN) 0.2 % ophthalmic solution Place 1 drop into both eyes 3 (three) times daily.   1  . Calcium Carbonate Antacid (TUMS E-X SUGAR FREE PO) Take 2 tablets by mouth every morning.    . Cholecalciferol  (VITAMIN D) 1000 UNITS capsule Take 1,000 Units by mouth every morning.     . flecainide (TAMBOCOR) 100 MG tablet TAKE 1 TABLET BY MOUTH TWICE DAILY 180 tablet 3  . insulin glargine (LANTUS) 100 UNIT/ML injection Inject 20 Units into the skin every morning.     . insulin regular (NOVOLIN R,HUMULIN R) 100 units/mL injection Inject 2-10 Units into the skin 3 (three) times daily as needed for high blood sugar (per sliding scale). SLIDING SCALE     . lisinopril (PRINIVIL,ZESTRIL) 5 MG tablet Take 2.5 mg by mouth every evening.   4  . metoprolol tartrate (LOPRESSOR) 25 MG tablet Take 25 mg by mouth daily as needed (high heart rate).   7  . Multiple Vitamins-Minerals (PRESERVISION AREDS 2) CAPS Take 1 capsule by mouth 2 (two) times daily.    . nitroGLYCERIN (NITROSTAT) 0.4 MG SL tablet Place 0.4 mg under the tongue every 5 (five) minutes x 3 doses as needed for chest pain.     Marland Kitchen ondansetron (ZOFRAN ODT) 4 MG disintegrating tablet Take 1 tablet (4 mg total) by mouth every 8 (eight) hours as needed. (Patient taking differently: Take 4 mg by mouth every 8 (eight) hours as needed for nausea or vomiting. ) 10 tablet  1  . Polyethyl Glycol-Propyl Glycol (SYSTANE OP) Apply 1 drop to eye 2 (two) times daily.    . vitamin C (ASCORBIC ACID) 500 MG tablet Take 1,000 mg by mouth at bedtime.     . vitamin E 1000 UNIT capsule Take 1,000 Units by mouth at bedtime.       Drug Regimen Review Drug regimen was reviewed and remains appropriate with no significant issues identified  Home: Home Living Family/patient expects to be discharged to:: Private residence Living Arrangements: Children Available Help at Discharge: Family, Available 24 hours/day Type of Home: House Home Access: Stairs to enter [employer] of Steps: 5 Entrance Stairs-Rails: Left Home Layout: One level Bathroom Shower/Tub: Chiropodist: Standard Home Equipment: Toilet riser Additional Comments: daughter states pt  can come to her home if needed and then will have 1 step and a walk in shower.   Functional History: Prior Function Level of Independence: Independent Comments: drove, did grocery shopping, did not walk long distances due to compressed spine with back pain.   Functional Status:  Mobility: Bed Mobility Overal bed mobility: Needs Assistance Bed Mobility: Sit to Supine Supine to sit: Mod assist Sit to supine: Mod assist General bed mobility comments: Pt able to get own legs into bed.  Daughter stating she should not do this as it is hard on her abdominal muscles but did explain that helping with this task will build up strength. Transfers Overall transfer level: Needs assistance Equipment used: 1 person hand held assist Transfers: Sit to/from Stand, Stand Pivot Transfers Sit to Stand: Mod assist Stand pivot transfers: Min assist General transfer comment: Pt did well once up; just needed more assist to power up. Ambulation/Gait General Gait Details: unable today    ADL: ADL Overall ADL's : Needs assistance/impaired Eating/Feeding: Minimal assistance, Sitting Eating/Feeding Details (indicate cue type and reason): new diet just getting started but pt could drink from cup and use untensils Ily. Grooming: Wash/dry hands, Wash/dry face, Oral care, Set up, Sitting Grooming Details (indicate cue type and reason): encouaged daughter to allow pt to do these tasks for herself at all times. Upper Body Bathing: Minimal assistance, Sitting Upper Body Bathing Details (indicate cue type and reason): Pt very sore but with encouragement can bathe in chair. Lower Body Bathing: Moderate assistance, Sit to/from stand, Cueing for compensatory techniques Lower Body Bathing Details (indicate cue type and reason): Pt requires assist reaching lower legs and when standing to reach backside. Upper Body Dressing : Moderate assistance, Sitting Lower Body Dressing: Moderate assistance, Sit to/from stand, Cueing  for compensatory techniques Lower Body Dressing Details (indicate cue type and reason): Pt may benefit from AE to start to reach lower legs due to pain.   Toilet Transfer: Minimal assistance, BSC, RW Toileting- Clothing Manipulation and Hygiene: Moderate assistance, Sit to/from stand Toileting - Clothing Manipulation Details (indicate cue type and reason): Colostomy bag emptied by nursing at this point. Pt ready to take this task over for herself. Functional mobility during ADLs: Minimal assistance, +2 for safety/equipment General ADL Comments: Pt is capeable of moving foward with doing more adls. Pt is most limited by pain in her abdomen.  Feel she may do more if daughter is not in room.  Cognition: Cognition Overall Cognitive Status: Within Functional Limits for tasks assessed Orientation Level: Oriented X4 Cognition Arousal/Alertness: Awake/alert Behavior During Therapy: WFL for tasks assessed/performed Overall Cognitive Status: Within Functional Limits for tasks assessed General Comments: Pt is able to do quite a bit.  Did constantly encouarge daughter to let pt do for herself.  Daughter quick to do everything for pt including drink from a cup.  Physical Exam: Blood pressure (!) 143/48, pulse 74, temperature 97.6 F (36.4 C), temperature source Oral, resp. rate 16, height '5\' 3"'$  (1.6 m), weight 66.3 kg (146 lb 2.6 oz), SpO2 98 %. Physical Exam  Vitals reviewed. Constitutional: She appears well-developed and well-nourished.  HENT:  Head: Normocephalic and atraumatic.  Eyes: EOM are normal. Right eye exhibits no discharge. Left eye exhibits no discharge.  Neck: Normal range of motion. Neck supple. No thyromegaly present.  Cardiovascular: Normal rate and regular rhythm.  Respiratory: Effort normal and breath sounds normal. No respiratory distress.  + [place]  GI: Soft. Bowel sounds are normal. She exhibits no distension.  Musculoskeletal:  No edema or tenderness in extremities    Neurological: She is alert.  Oriented 1 Motor: 4/5 throughout  Skin: Skin is warm and dry.  Surgical site colostomy in place  Psychiatric: She has a normal mood and affect. Her behavior is normal.   Results for orders placed or performed during the hospital encounter of 09/26/17 (from the past 48 hour(s))  Glucose, capillary     Status: Abnormal   Collection Time: 10/08/17 12:10 PM  Result Value Ref Range   Glucose-Capillary 197 (H) 65 - 99 mg/dL  Glucose, capillary     Status: Abnormal   Collection Time: 10/08/17  5:19 PM  Result Value Ref Range   Glucose-Capillary 167 (H) 65 - 99 mg/dL  Glucose, capillary     Status: Abnormal   Collection Time: 10/08/17  8:51 PM  Result Value Ref Range   Glucose-Capillary 109 (H) 65 - 99 mg/dL  Glucose, capillary     Status: Abnormal   Collection Time: 10/09/17  4:19 AM  Result Value Ref Range   Glucose-Capillary 133 (H) 65 - 99 mg/dL  Glucose, capillary     Status: None   Collection Time: 10/09/17  7:46 AM  Result Value Ref Range   Glucose-Capillary 99 65 - 99 mg/dL  Glucose, capillary     Status: Abnormal   Collection Time: 10/09/17 12:27 PM  Result Value Ref Range   Glucose-Capillary 130 (H) 65 - 99 mg/dL  Glucose, capillary     Status: Abnormal   Collection Time: 10/09/17  3:03 PM  Result Value Ref Range   Glucose-Capillary 120 (H) 65 - 99 mg/dL  Glucose, capillary     Status: Abnormal   Collection Time: 10/09/17  5:10 PM  Result Value Ref Range   Glucose-Capillary 136 (H) 65 - 99 mg/dL  Glucose, capillary     Status: Abnormal   Collection Time: 10/09/17 10:33 PM  Result Value Ref Range   Glucose-Capillary 150 (H) 65 - 99 mg/dL  CBC     Status: Abnormal   Collection Time: 10/10/17  4:45 AM  Result Value Ref Range   WBC 13.3 (H) 4.0 - 10.5 K/uL   RBC 3.29 (L) 3.87 - 5.11 MIL/uL   Hemoglobin 9.6 (L) 12.0 - 15.0 g/dL   HCT 29.6 (L) 36.0 - 46.0 %   MCV 90.0 78.0 - 100.0 fL   MCH 29.2 26.0 - 34.0 pg   MCHC 32.4 30.0 - 36.0  g/dL   RDW 13.6 11.5 - 15.5 %   Platelets 437 (H) 150 - 400 K/uL    Comment: Performed at [facility], [address], [place], [place] [address]  Basic metabolic panel  Status: Abnormal   Collection Time: 10/10/17  4:45 AM  Result Value Ref Range   Sodium 135 135 - 145 mmol/L   Potassium 4.2 3.5 - 5.1 mmol/L   Chloride 94 (L) 101 - 111 mmol/L   CO2 29 22 - 32 mmol/L   Glucose, Bld 214 (H) 65 - 99 mg/dL   BUN 6 6 - 20 mg/dL   Creatinine, Ser 0.90 0.44 - 1.00 mg/dL   Calcium 8.0 (L) 8.9 - 10.3 mg/dL   GFR calc non Af Amer >60 >60 mL/min   GFR calc Af Amer >60 >60 mL/min    Comment: (NOTE) The eGFR has been calculated using the CKD EPI equation. This calculation has not been validated in all clinical situations. eGFR's persistently <60 mL/min signify possible Chronic Kidney Disease.    Anion gap 12 5 - 15    Comment: Performed at [facility] [address] [place], [place] [address]  Glucose, capillary     Status: Abnormal   Collection Time: 10/10/17  7:59 AM  Result Value Ref Range   Glucose-Capillary 217 (H) 65 - 99 mg/dL   No results found.  Medical Problem List and Plan: 1.  Debility secondary to peritonitis/sepsis.  Status post left and sigmoid colectomy with colostomy 2.  DVT Prophylaxis/Anticoagulation: Subcutaneous heparin.  Monitor for any bleeding episodes 3. Pain Management: Oxycodone and Robaxin as needed 4. Mood: Provide emotional support 5. Neuropsych: This patient is not fully capable of making decisions on her own behalf. 6. Skin/Wound Care: Routine skin checks 7. Fluids/Electrolytes/Nutrition: Routine in and outs with follow-up chemistries 8.  PAF.  Cardiac rate controlled.  Tambocor 100 mg twice daily 9.  Diabetes mellitus.  Hemoglobin A1c 6.4.  Lantus insulin 6 units daily.  Check blood sugars before meals and at bedtime 10.  History of GI bleed.  Continue Protonix.  No bleeding episodes.   Post Admission Physician  Evaluation: 1. Preadmission assessment reviewed and changes made below. 2. Functional deficits secondary  to debility. 3. Patient is admitted to receive collaborative, interdisciplinary care between the physiatrist, rehab nursing staff, and therapy team. 4. Patient's level of medical complexity and substantial therapy needs in context of that medical necessity cannot be provided at a lesser intensity of care such as a SNF. 5. Patient has experienced substantial functional loss from his/her baseline which was documented above under the "Functional History" and "Functional Status" headings.  Judging by the patient's diagnosis, physical exam, and functional history, the patient has potential for functional progress which will result in measurable gains while on inpatient rehab.  These gains will be of substantial and practical use upon discharge  in facilitating mobility and self-care at the household level. 6. Physiatrist will provide 24 hour management of medical needs as well as oversight of the therapy plan/treatment and provide guidance as appropriate regarding the interaction of the two. 7. 24 hour rehab nursing will assist with safety, skin/wound care, disease management, medication administration, pain management and patient education  and help integrate therapy concepts, techniques,education, etc. 8. PT will assess and treat for/with: Lower extremity strength, range of motion, stamina, balance, functional mobility, safety, adaptive techniques and equipment, wound care, coping skills, pain control, education. Goals are: supervision/min A. 9. OT will assess and treat for/with: ADL's, functional mobility, safety, upper extremity strength, adaptive techniques and equipment, wound mgt, ego support, and community reintegration.   Goals are: supervision/min a. Therapy may not proceed with showering this patient. 10. Case Management and Education officer, museum  will assess and treat for psychological issues and  discharge planning. 11. Team conference will be held weekly to assess progress toward goals and to determine barriers to discharge. 12. Patient will receive at least 3 hours of therapy per day at least 5 days per week. 13. ELOS:  13-17 days.       14. Prognosis:  good  I have personally performed a face to face diagnostic evaluation, including, but not limited to relevant history and physical exam findings, of this patient and developed relevant assessment and plan.  Additionally, I have reviewed and concur with the physician assistant's documentation above.  Delice Lesch, MD, ABPMR Lavon Paganini Angiulli, PA-C 10/10/2017

## 2017-10-11 ENCOUNTER — Inpatient Hospital Stay (HOSPITAL_COMMUNITY): Payer: Medicare Other | Admitting: Physical Therapy

## 2017-10-11 ENCOUNTER — Inpatient Hospital Stay (HOSPITAL_COMMUNITY): Payer: Medicare Other | Admitting: Occupational Therapy

## 2017-10-11 ENCOUNTER — Inpatient Hospital Stay (HOSPITAL_COMMUNITY): Payer: Medicare Other

## 2017-10-11 DIAGNOSIS — I480 Paroxysmal atrial fibrillation: Secondary | ICD-10-CM

## 2017-10-11 DIAGNOSIS — R5381 Other malaise: Principal | ICD-10-CM

## 2017-10-11 DIAGNOSIS — E1165 Type 2 diabetes mellitus with hyperglycemia: Secondary | ICD-10-CM

## 2017-10-11 LAB — GLUCOSE, CAPILLARY
GLUCOSE-CAPILLARY: 238 mg/dL — AB (ref 65–99)
Glucose-Capillary: 246 mg/dL — ABNORMAL HIGH (ref 65–99)
Glucose-Capillary: 212 mg/dL — ABNORMAL HIGH (ref 65–99)
Glucose-Capillary: 244 mg/dL — ABNORMAL HIGH (ref 65–99)
Glucose-Capillary: 245 mg/dL — ABNORMAL HIGH (ref 65–99)

## 2017-10-11 LAB — COMPREHENSIVE METABOLIC PANEL
ALT: 13 U/L — AB (ref 14–54)
ANION GAP: 9 (ref 5–15)
AST: 13 U/L — ABNORMAL LOW (ref 15–41)
Albumin: 1.5 g/dL — ABNORMAL LOW (ref 3.5–5.0)
Alkaline Phosphatase: 75 U/L (ref 38–126)
BUN: 9 mg/dL (ref 6–20)
CHLORIDE: 94 mmol/L — AB (ref 101–111)
CO2: 32 mmol/L (ref 22–32)
CREATININE: 0.82 mg/dL (ref 0.44–1.00)
Calcium: 7.9 mg/dL — ABNORMAL LOW (ref 8.9–10.3)
Glucose, Bld: 257 mg/dL — ABNORMAL HIGH (ref 65–99)
POTASSIUM: 4 mmol/L (ref 3.5–5.1)
SODIUM: 135 mmol/L (ref 135–145)
Total Bilirubin: 0.7 mg/dL (ref 0.3–1.2)
Total Protein: 4.8 g/dL — ABNORMAL LOW (ref 6.5–8.1)

## 2017-10-11 LAB — CBC WITH DIFFERENTIAL/PLATELET
ABS IMMATURE GRANULOCYTES: 0.1 10*3/uL (ref 0.0–0.1)
BASOS ABS: 0 10*3/uL (ref 0.0–0.1)
Basophils Relative: 0 %
Eosinophils Absolute: 0.4 10*3/uL (ref 0.0–0.7)
Eosinophils Relative: 4 %
HCT: 28.1 % — ABNORMAL LOW (ref 36.0–46.0)
Hemoglobin: 9 g/dL — ABNORMAL LOW (ref 12.0–15.0)
IMMATURE GRANULOCYTES: 1 %
LYMPHS PCT: 12 %
Lymphs Abs: 1.4 10*3/uL (ref 0.7–4.0)
MCH: 29 pg (ref 26.0–34.0)
MCHC: 32 g/dL (ref 30.0–36.0)
MCV: 90.6 fL (ref 78.0–100.0)
MONO ABS: 1.2 10*3/uL — AB (ref 0.1–1.0)
Monocytes Relative: 11 %
NEUTROS ABS: 7.9 10*3/uL — AB (ref 1.7–7.7)
NEUTROS PCT: 72 %
PLATELETS: 443 10*3/uL — AB (ref 150–400)
RBC: 3.1 MIL/uL — AB (ref 3.87–5.11)
RDW: 13.7 % (ref 11.5–15.5)
WBC: 10.9 10*3/uL — ABNORMAL HIGH (ref 4.0–10.5)

## 2017-10-11 MED ORDER — PREMIER PROTEIN SHAKE
2.0000 [oz_av] | Freq: Three times a day (TID) | ORAL | Status: DC
  Administered 2017-10-11 – 2017-10-18 (×18): 2 [oz_av] via ORAL
  Filled 2017-10-11 (×32): qty 325.31

## 2017-10-11 MED ORDER — PREMIER PROTEIN SHAKE
2.0000 [oz_av] | Freq: Two times a day (BID) | ORAL | Status: DC
  Administered 2017-10-11: 2 [oz_av] via ORAL
  Filled 2017-10-11 (×4): qty 325.31

## 2017-10-11 NOTE — Evaluation (Signed)
Speech Language Pathology Assessment and Plan  Patient Details  Name: Barbara [name] MRN: [id number] Date of Birth: 03/19/1941  SLP Diagnosis: Cognitive Impairments  Rehab Potential: Good ELOS: 10/25/2017    Today's Date: 10/11/2017 SLP Individual Time: 0800-0900 SLP Individual Time Calculation (min): 60 min   Problem List:  Patient Active Problem List   Diagnosis Date Noted  . Debility 10/10/2017  . Postoperative pain   . PAF (paroxysmal atrial fibrillation) ([place])   . Diabetes mellitus type 2 in nonobese (HCC)   . History of GI bleed   . Diabetic acidosis without coma ([place])   . Wide Complex Tachycardia/H/o SVT and H/o Afib 09/29/2017  . Sepsis ([place]) 09/29/2017  . Colonic diverticular abscess 09/27/2017  . Diverticulitis of both large and small intestine with perforation and abscess without bleeding 09/24/2017  . Unspecified atrial fibrillation ([place]) 09/23/2017  . Dizziness 07/28/2015  . Orthostatic hypotension 07/28/2015  . Rectal bleeding 12/05/2013  . Melena 12/05/2013  . Personal history of colonic polyps 12/05/2013  . Rapid atrial fibrillation ([place]) 08/15/2013  . Atrial fibrillation with RVR ([place]) 08/15/2013  . Right upper quadrant pain 11/16/2011  . Abdominal pain, acute, left lower quadrant 09/01/2011  . Cerebrovascular disease 10/09/2010  . Hypertension   . Hyperlipidemia   . Diabetes mellitus ([place])   . Arteriosclerotic cardiovascular disease (ASCVD)   . Macular degeneration   . Tobacco abuse   . Asthma   . Palpitations   . Chest pain    Past Medical History:  Past Medical History:  Diagnosis Date  . Arteriosclerotic cardiovascular disease (ASCVD)    non-obstructive; negative stress nuclear and normal echo in 08/[facility] in 03/2000-20%  main 20% LAD, 30% circumflex, 30% RCA, hyperdynamic LV  . Asthma    Mild  . Chest pain   . Chronic back pain   . Diabetes mellitus    A1c of 7.6 in 04/2010; moderate to high dose insulin   . GERD (gastroesophageal reflux disease)   . Hyperlipidemia    total cholesterol of 271 and LDL of 187 in 07/2010  . Hypertension   . Macular degeneration   . Palpitations   . Stroke [facility]) 1993 2008   short term memory loss  . Tobacco abuse    Past Surgical History:  Past Surgical History:  Procedure Laterality Date  . APPENDECTOMY    . CATARACT EXTRACTION     left  . COLECTOMY WITH COLOSTOMY CREATION/HARTMANN PROCEDURE Left 09/29/2017   Procedure: EXTENDED LEFT HEMI COLECTOMY WITH COLOSTOMY CREATION (HARTMANN PROCEDURE);  Surgeon: Greer Pickerel, MD;  Location: [place];  Service: General;  Laterality: Left;  . COLONOSCOPY  2009  . COLONOSCOPY  03/30/2012   Procedure: COLONOSCOPY;  Surgeon: Rogene Houston, MD;  [facility];  Service: Endoscopy;  Laterality: N/A;  730  . COLONOSCOPY N/A 12/13/2013   Procedure: COLONOSCOPY;  Surgeon: Rogene Houston, MD;  [facility];  Service: Endoscopy;  Laterality: N/A;  1200  . ESOPHAGOGASTRODUODENOSCOPY  10/21/2011   Procedure: ESOPHAGOGASTRODUODENOSCOPY (EGD);  Surgeon: Rogene Houston, MD;  [facility];  Service: Endoscopy;  Laterality: N/A;  1200  . ESOPHAGOGASTRODUODENOSCOPY N/A 12/13/2013   Procedure: ESOPHAGOGASTRODUODENOSCOPY (EGD);  Surgeon: Rogene Houston, MD;  [facility];  Service: Endoscopy;  Laterality: N/A;  . LAPAROTOMY N/A 09/29/2017   Procedure: EXPLORATORY LAPAROTOMY;  Surgeon: Greer Pickerel, MD;  Location: [place];  Service: General;  Laterality: N/A;  . TUBAL LIGATION  Assessment / Plan / Recommendation Clinical Impression Barbara [name] is a 77 year old right-handed female with history of diabetes mellitus, hypertension, PAF with history of GI bleed, chronic low back pain with radiculopathy bilateral lower extremities and recent admission for diverticulitis with abscess and discharged home on 09/25/2017. Per chart review patient lives with spouse, who is legally blind, and daughter is  available to assist upon dischrage. Independent prior to admission. She was still driving doing her grocery shopping. One level home with 5 steps to entry. Presented to [facility] on 09/26/2017 with increasing abdominal pain as well as nausea vomiting and diarrhea. Developed SVT requiring synchronized cardioversion but developed hypotension. She was started on amiodarone and transferred to [facility] for management. Supportive management recommended by EP and has been maintained on Tambocor. Due to sepsis with diffuse peritonitis, surgical intervention recommended and underwent exploratory laparotomy with left and sigmoid colectomy colostomy by Dr. Redmond Pulling. She tolerated extubation without difficulty but needed reintubation 10/02/2017 due to increasing shortness of breath and lethargy with delirium. Postoperative course significant for intermittent hypotension with prolonged ileus, electrolyte abnormalities, and progressive leukocytosis. She was later extubated 10/04/2017. A follow-up CT of the abdomen and pelvis completed showing no recurrent abscess identified. No obstructive changes. Subcutaneous heparin for DVT prophylaxis. Physical and occupational therapy evaluations completed with recommendations of physical medicine rehab consult. Patient was admitted for a comprehensive rehab program 10/10/17.  Pt presents with moderate cognitive linguistic impairments in mildly complex problem solving, and recall of novel information and maximum impairment in emergent awareness, mainly impacted by deficits in sustained attention, which is further supported by a score of 14 out 30 on MOCA version 8.2 (n=>26.) Pt presents within functional limits in speech skills and swallow function given trials of regular textured foods and thin liquids via cup/straw with appropriate oral and pharyngeal function noted, although reduced appetite. Pt would benefit from skilled ST services in order to maximize  functional independence and reduce burden of care prior to discharge.    Skilled Therapeutic Interventions          Skilled ST services focused on cognitive skills. SLP facilitated sustained attention requiring Mod A verbal cues for redirection every 10 minutes, pt often falling asleep. Pt demonstrated awareness of cognitive changes and daughter was present to support changes and piror Mod I.Pt requested bed pan and demonstrated ability to follow basic commands, however activities limited by pain. Pt was left in room with call bell within reach. Reccomend to continue skilled ST services.    SLP Assessment  Patient will need skilled Speech Lanaguage Pathology Services during CIR admission    Recommendations  SLP Diet Recommendations: Thin Liquid Administration via: Straw;Cup Medication Administration: Whole meds with liquid Supervision: Patient able to self feed Compensations: Small sips/bites;Slow rate;Minimize environmental distractions Postural Changes and/or Swallow Maneuvers: Seated upright 90 degrees Oral Care Recommendations: Oral care BID Patient destination: Home Follow up Recommendations: Home Health SLP;Outpatient SLP;24 hour supervision/assistance Equipment Recommended: None recommended by SLP    SLP Frequency 3 to 5 out of 7 days   SLP Duration  SLP Intensity  SLP Treatment/Interventions 6/4  Minumum of 1-2 x/day, 30 to 90 minutes  Cognitive remediation/compensation;Cueing hierarchy;Functional tasks;Patient/family education    Pain Pain Assessment Pain Scale: 0-10 Pain Score: 3  Faces Pain Scale: Hurts even more Pain Type: Surgical pain Pain Location: Abdomen Pain Orientation: Mid Pain Descriptors / Indicators: Guarding Pain Onset: With Activity Pain Intervention(s): Emotional support;Repositioned  Prior Functioning Type of Home: House  Lives With: Spouse(spouse is legally blind) Available Help at Discharge: Family;Available 24 hours/day(daughter will be able  to assist some if needed) Vocation: Retired  Function:  Eating Eating   Modified Consistency Diet: No Eating Assist Level: No help, No cues           Cognition Comprehension Comprehension assist level: Follows basic conversation/direction with extra time/assistive device  Expression   Expression assist level: Expresses basic needs/ideas: With no assist  Social Interaction Social Interaction assist level: Interacts appropriately 75 - 89% of the time - Needs redirection for appropriate language or to initiate interaction.  Problem Solving Problem solving assist level: Solves basic 50 - 74% of the time/requires cueing 25 - 49% of the time  Memory Memory assist level: Recognizes or recalls 50 - 74% of the time/requires cueing 25 - 49% of the time   Short Term Goals: Week 1: SLP Short Term Goal 1 (Week 1): Pt will demonstrate sustained attention to functional tasks for 20 minutes with min A verbal cues for redirection. SLP Short Term Goal 2 (Week 1): Pt utilize external memory aids to recall new, daily information with min A verbal and question cues.  SLP Short Term Goal 3 (Week 1): Pt will demonstrate funtional problem solving for basic and familiar (mildly complex) tasks with min A verbal cues. SLP Short Term Goal 4 (Week 1): Pt will self-mointor and correct functional errors during problem soving tasks with mod A verbal cues.   Refer to Care Plan for Long Term Goals  Recommendations for other services: None   Discharge Criteria: Patient will be discharged from SLP if patient refuses treatment 3 consecutive times without medical reason, if treatment goals not met, if there is a change in medical status, if patient makes no progress towards goals or if patient is discharged from hospital.  The above assessment, treatment plan, treatment alternatives and goals were discussed and mutually agreed upon: by patient and by family  Barbara [name]  [facility] 10/11/2017, 2:26 PM

## 2017-10-11 NOTE — Progress Notes (Signed)
PMR Admission Coordinator Pre-Admission Assessment  Patient: Barbara [name] is an 77 y.o., female MRN: [id number] DOB: [birth year] Height: 5\' 3"  (160 cm) Weight: 66.3 kg (146 lb 2.6 oz)                                                                                                                                                  Insurance Information HMO:     PPO: X     PCP:      IPA:      80/20:      OTHER: PRIMARY: BCBS Medicare       Policy#: [id number]      Subscriber: Self CM Name: Jolene Schimke       Phone#: [phone]     Fax#: [phone] Pre-Cert#: Verbal ok to proceed with admission from [facility] on 10/10/17. Authorization #[id number] for 10/10/17-10/18/17 with faxed updates due 10/17/17         Employer: Retired  Benefits:  Phone #: [phone]     Name: Verified online at [email or web address] Eff. Date: 05/24/17     Deduct: $0      Out of Pocket Max: $5900      Life Max: N/A CIR: $310 a day, days 1-6; $0 a day, days 7+      SNF: $0 a day, days 1-20; $172 a day, days 21-60; $0 a day, days 61-100 Outpatient: Necessity      Co-Pay: $40 per visit  Home Health: Necessity       Co-Pay: $0 per visit  DME: 80%     Co-Pay: 20% Providers: In-network   SECONDARY: None        Emergency Contact Information         Contact Information    Name Relation Home Work Hailey Daughter [phone]  [phone]   [place], [place] [phone]     Holt,Tiffany Grandaughter   [phone]     Current Medical History  Patient Admitting Diagnosis: Debility related to peritonitis/sepsis   History of Present Illness: Barbara [name] is a 77 year old right-handed female with history of diabetes mellitus, hypertension, PAF with history of GI bleed, chronic low back pain with radiculopathy bilateral lower extremities and recent admission for diverticulitis with abscess and discharged home on 09/25/2017. Per chart review patient lives with spouse, who is legally blind, and  daughter is available to assist upon dischrage. Independent prior to admission. She was still driving doing her grocery shopping. One level home with 5 steps to entry. Presented to [facility] on 09/26/2017 with increasing abdominal pain as well as nausea vomiting and diarrhea. Developed SVT requiring synchronized cardioversion but developed hypotension. She was started on amiodarone and transferred to [facility] for management. Supportive management recommended by EP and has been maintained on Tambocor. Due to sepsis with diffuse peritonitis, surgical intervention recommended and underwent  exploratory laparotomy with left and sigmoid colectomy colostomy by Dr. Redmond Pulling. She tolerated extubation without difficulty but needed reintubation 10/02/2017 due to increasing shortness of breath and lethargy with delirium. Postoperative course significant for intermittent hypotension with prolonged ileus, electrolyte abnormalities, and progressive leukocytosis. She was later extubated 10/04/2017. A follow-up CT of the abdomen and pelvis completed showing no recurrent abscess identified. No obstructive changes. Subcutaneous heparin for DVT prophylaxis. Physical and occupational therapy evaluations completed with recommendations of physical medicine rehab consult. Patient was admitted for a comprehensive rehab program 10/10/17.    Past Medical History      Past Medical History:  Diagnosis Date  . Arteriosclerotic cardiovascular disease (ASCVD)    non-obstructive; negative stress nuclear and normal echo in 08/2009 [facility] cardiac [facility] in 03/2000-20%  main 20% LAD, 30% circumflex, 30% RCA, hyperdynamic LV  . Asthma    Mild  . Chest pain   . Chronic back pain   . Diabetes mellitus    A1c of 7.6 in 04/2010; moderate to high dose insulin  . GERD (gastroesophageal reflux disease)   . Hyperlipidemia    total cholesterol of 271 and LDL of 187 in 07/2010  .  Hypertension   . Macular degeneration   . Palpitations   . Stroke [facility]) 1993 2008   short term memory loss  . Tobacco abuse     Family History  family history includes Cancer in her brother, father, sister, sister, and sister; Heart attack in her brother and mother.  Prior Rehab/Hospitalizations:  Has the patient had major surgery during 100 days prior to admission? No  Current Medications   Current Facility-Administered Medications:  .  acetaminophen (TYLENOL) suppository 650 mg, 650 mg, Rectal, Q6H PRN, Deterding, Guadelupe Sabin, MD .  aspirin chewable tablet 81 mg, 81 mg, Oral, Daily, Ghimire, Henreitta Leber, MD, 81 mg at 10/10/17 9798 .  brimonidine (ALPHAGAN) 0.2 % ophthalmic solution 1 drop, 1 drop, Both Eyes, TID, Greer Pickerel, MD, 1 drop at 10/10/17 [phone] .  chlorhexidine (PERIDEX) 0.12 % solution 15 mL, 15 mL, Mouth Rinse, BID, Rush Farmer, MD, 15 mL at 10/10/17 0819 .  Chlorhexidine Gluconate Cloth 2 % PADS 6 each, 6 each, Topical, Daily, Greer Pickerel, MD, 6 each at 10/09/17 0910 .  dextrose 5 %-0.45 % sodium chloride infusion, , Intravenous, Continuous, Deterding, Guadelupe Sabin, MD, Last Rate: 10 mL/hr at 10/09/17 0901 .  feeding supplement (ENSURE ENLIVE) (ENSURE ENLIVE) liquid 237 mL, 237 mL, Oral, BID BM, Rush Farmer, MD, 237 mL at 10/10/17 0821 .  flecainide (TAMBOCOR) tablet 100 mg, 100 mg, Oral, BID, Ghimire, Henreitta Leber, MD, 100 mg at 10/10/17 0820 .  heparin injection 5,000 Units, 5,000 Units, Subcutaneous, Q8H, Greer Pickerel, MD, 5,000 Units at 10/10/17 1223 .  HYDROmorphone (DILAUDID) injection 0.5 mg, 0.5 mg, Intravenous, Q4H PRN, Jonetta Osgood, MD, 0.5 mg at 10/10/17 0842 .  insulin aspart (novoLOG) injection 0-9 Units, 0-9 Units, Subcutaneous, TID WC, Ghimire, Henreitta Leber, MD, 3 Units at 10/10/17 1223 .  insulin glargine (LANTUS) injection 6 Units, 6 Units, Subcutaneous, QHS, Ghimire, Henreitta Leber, MD, 6 Units at 10/09/17 2312 .  MEDLINE mouth rinse, 15 mL,  Mouth Rinse, q12n4p, Rush Farmer, MD, 15 mL at 10/05/17 1532 .  methocarbamol (ROBAXIN) tablet 500 mg, 500 mg, Oral, Q8H PRN, Meuth, Brooke A, PA-C .  metoprolol tartrate (LOPRESSOR) tablet 12.5 mg, 12.5 mg, Oral, BID, Rush Farmer, MD, 12.5 mg at 10/10/17 0820 .  ondansetron (ZOFRAN) tablet  4 mg, 4 mg, Oral, Q6H PRN **OR** ondansetron (ZOFRAN) injection 4 mg, 4 mg, Intravenous, Q6H PRN, Greer Pickerel, MD, 4 mg at 10/06/17 1044 .  oxyCODONE (Oxy IR/ROXICODONE) immediate release tablet 5-10 mg, 5-10 mg, Oral, Q4H PRN, Jonetta Osgood, MD, 10 mg at 10/10/17 1223 .  pantoprazole (PROTONIX) EC tablet 40 mg, 40 mg, Oral, QHS, Rush Farmer, MD, 40 mg at 10/09/17 2312 .  simethicone (MYLICON) chewable tablet 80 mg, 80 mg, Oral, QID PRN, Meuth, Brooke A, PA-C .  sodium chloride flush (NS) 0.9 % injection 10-40 mL, 10-40 mL, Intracatheter, PRN, Greer Pickerel, MD, 20 mL at 10/07/17 1605 .  white petrolatum (VASELINE) gel, , Topical, PRN, Erick Colace, NP  Patients Current Diet:       Diet Order           Diet Carb Modified Fluid consistency: Thin; Room service appropriate? Yes  Diet effective now        Diet - low sodium heart healthy          Precautions / Restrictions Precautions Precautions: Fall Precaution Comments: pt with wound VAC Restrictions Weight Bearing Restrictions: No   Has the patient had 2 or more falls or a fall with injury in the past year?Yes  Prior Activity Level Community (5-7x/wk): Prior to admission patient was fully independent at home and assisting spouse with home management due to him being legally blind.    Home Assistive Devices / Equipment Home Assistive Devices/Equipment: None Home Equipment: Toilet riser  Prior Device Use: Indicate devices/aids used by the patient prior to current illness, exacerbation or injury? None of the above  Prior Functional Level Prior Function Level of Independence: Independent Comments: drove, did  grocery shopping, did not walk long distances due to compressed spine with back pain.   Self Care: Did the patient need help bathing, dressing, using the toilet or eating? Independent  Indoor Mobility: Did the patient need assistance with walking from room to room (with or without device)? Independent  Stairs: Did the patient need assistance with internal or external stairs (with or without device)? Independent  Functional Cognition: Did the patient need help planning regular tasks such as shopping or remembering to take medications? Independent  Current Functional Level Cognition  Overall Cognitive Status: Within Functional Limits for tasks assessed Orientation Level: Oriented X4 General Comments: Pt is able to do quite a bit.  Did constantly encouarge daughter to let pt do for herself.  Daughter quick to do everything for pt including drink from a cup.    Extremity Assessment (includes Sensation/Coordination)  Upper Extremity Assessment: Generalized weakness  Lower Extremity Assessment: Defer to PT evaluation    ADLs  Overall ADL's : Needs assistance/impaired Eating/Feeding: Minimal assistance, Sitting Eating/Feeding Details (indicate cue type and reason): new diet just getting started but pt could drink from cup and use untensils Ily. Grooming: Wash/dry hands, Wash/dry face, Oral care, Set up, Sitting Grooming Details (indicate cue type and reason): encouaged daughter to allow pt to do these tasks for herself at all times. Upper Body Bathing: Minimal assistance, Sitting Upper Body Bathing Details (indicate cue type and reason): Pt very sore but with encouragement can bathe in chair. Lower Body Bathing: Moderate assistance, Sit to/from stand, Cueing for compensatory techniques Lower Body Bathing Details (indicate cue type and reason): Pt requires assist reaching lower legs and when standing to reach backside. Upper Body Dressing : Moderate assistance, Sitting Lower Body  Dressing: Moderate assistance, Sit to/from stand, Cueing for  compensatory techniques Lower Body Dressing Details (indicate cue type and reason): Pt may benefit from AE to start to reach lower legs due to pain.   Toilet Transfer: Minimal assistance, BSC, RW Toileting- Clothing Manipulation and Hygiene: Moderate assistance, Sit to/from stand Toileting - Clothing Manipulation Details (indicate cue type and reason): Colostomy bag emptied by nursing at this point. Pt ready to take this task over for herself. Functional mobility during ADLs: Minimal assistance, +2 for safety/equipment General ADL Comments: Pt is capeable of moving foward with doing more adls. Pt is most limited by pain in her abdomen.  Feel she may do more if daughter is not in room.    Mobility  Overal bed mobility: Needs Assistance Bed Mobility: Sit to Supine Supine to sit: Mod assist Sit to supine: Mod assist General bed mobility comments: Pt able to get own legs into bed.  Daughter stating she should not do this as it is hard on her abdominal muscles but did explain that helping with this task will build up strength.    Transfers  Overall transfer level: Needs assistance Equipment used: 1 person hand held assist Transfers: Sit to/from Stand, Stand Pivot Transfers Sit to Stand: Mod assist Stand pivot transfers: Min assist General transfer comment: Pt did well once up; just needed more assist to power up.    Ambulation / Gait / Stairs / Wheelchair Mobility  Ambulation/Gait General Gait Details: unable today    Posture / Balance Dynamic Sitting Balance Sitting balance - Comments: touch support to encourage her efforts Balance Overall balance assessment: Needs assistance Sitting-balance support: Feet supported Sitting balance-Leahy Scale: Fair Sitting balance - Comments: touch support to encourage her efforts Postural control: Posterior lean Standing balance support: Bilateral upper extremity supported, During  functional activity Standing balance-Leahy Scale: Fair Standing balance comment: using PT support directly on trunk and min assist to step to chair    Special needs/care consideration BiPAP/CPAP: No CPM: No Continuous Drip IV: No Dialysis: No        Life Vest: No Oxygen: None PTA, Now 2.5 L nasal cannula being weaned  Special Bed: No Trach Size: No Wound Vac (area): Yes      Location: Abdomen  Skin: Ecchymosis                               Location: Bilateral arms Bowel mgmt: New colostomy changed this morning by [facility] nurse with small amount of brown unformed stool  Bladder mgmt: External foley  Diabetic mgmt: Yes with frequent home CBG checks, 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  Discharge Living Setting Plans for Discharge Living Setting: Other (Comment)(Patient's daughter has offered for her to go home with her ) Type of Home at Discharge: House Discharge Home Layout: One level Discharge Home Access: Stairs to enter Entrance Stairs-Rails: Can reach both Entrance Stairs-Number of Steps: 3 Discharge Bathroom Shower/Tub: Walk-in shower Discharge Bathroom Toilet: Handicapped height Discharge Bathroom Accessibility:  Yes How Accessible: Accessible via walker Does the patient have any problems obtaining your medications?: No  Social/Family/Support Systems Patient Roles: Spouse, Parent, Other  (Comment)(Grandparent ) Contact Information: Daughter, Manuela Schwartz  Anticipated Caregiver: Daughter and granddaughter  Anticipated Caregiver's Contact Information: [phone] Ability/Limitations of Caregiver: None Caregiver Availability: 24/7 Discharge Plan Discussed with Primary Caregiver: Yes Is Caregiver In Agreement with Plan?: Yes Does Caregiver/Family have Issues with Lodging/Transportation while Pt is in Rehab?: No  Goals/Additional Needs Patient/Family Goal for Rehab: PT/OT/SLP: Supervision-Mod I  Expected length of stay: 12-18 days  Cultural Considerations: Baptist  Dietary Needs: Carb. Mod. diet restrictions  Equipment Needs: TBD Special Service Needs: [facility] Nurse following  Additional Information: Pt's spouse is legally blind and likley unable to assist with care Pt/Family Agrees to Admission and willing to participate: Yes Program Orientation Provided & Reviewed with Pt/Caregiver Including Roles  & Responsibilities: Yes Additional Information Needs: Pt. with new colonostomy and would vac needs education; history of being a diabetic that needs to monitored closely  Information Needs to be Provided By: Medical team FYI  Barriers to Discharge: Medical stability, Wound Care  Decrease burden of Care through IP rehab admission: No  Possible need for SNF placement upon discharge: Not anticipated   Patient Condition: This patient's medical and functional status has changed since the consult dated: 10/06/17 in which the Rehabilitation Physician determined and documented that the patient's condition is appropriate for intensive rehabilitative care in an inpatient rehabilitation facility. See "History of Present Illness" (above) for medical update. Functional changes are: Min-Mod A transfers. Patient's medical and functional status update has been discussed with the Rehabilitation physician and patient remains appropriate for inpatient rehabilitation. Will admit to inpatient rehab  today.  Preadmission Screen Completed By:  Gunnar Fusi, 10/10/2017 2:43 PM ______________________________________________________________________   Discussed status with Dr. Posey Pronto on 10/10/17 at 1514 and received telephone approval for admission today.  Admission Coordinator:  Gunnar Fusi, time 1514/Date 10/10/17             Cosigned by: Jamse Arn, MD at 10/10/2017 4:02 PM  Revision History

## 2017-10-11 NOTE — Evaluation (Signed)
Physical Therapy Assessment and Plan  Patient Details  Name: Barbara [name] MRN: [id number] Date of Birth: Dec 21, 1940  PT Diagnosis: Abnormal posture, Difficulty walking, Low back pain and Muscle weakness Rehab Potential: Good ELOS: 12-14 days   Today's Date: 10/11/2017 PT Individual Time: 0900-1000 PT Individual Time Calculation (min): 60 min    Problem List:  Patient Active Problem List   Diagnosis Date Noted  . Debility 10/10/2017  . Postoperative pain   . PAF (paroxysmal atrial fibrillation) ([place])   . Diabetes mellitus type 2 in nonobese (HCC)   . History of GI bleed   . Diabetic acidosis without coma ([place])   . Wide Complex Tachycardia/H/o SVT and H/o Afib 09/29/2017  . Sepsis ([place]) 09/29/2017  . Colonic diverticular abscess 09/27/2017  . Diverticulitis of both large and small intestine with perforation and abscess without bleeding 09/24/2017  . Unspecified atrial fibrillation ([place]) 09/23/2017  . Dizziness 07/28/2015  . Orthostatic hypotension 07/28/2015  . Rectal bleeding 12/05/2013  . Melena 12/05/2013  . Personal history of colonic polyps 12/05/2013  . Rapid atrial fibrillation ([place]) 08/15/2013  . Atrial fibrillation with RVR ([place]) 08/15/2013  . Right upper quadrant pain 11/16/2011  . Abdominal pain, acute, left lower quadrant 09/01/2011  . Cerebrovascular disease 10/09/2010  . Hypertension   . Hyperlipidemia   . Diabetes mellitus ([place])   . Arteriosclerotic cardiovascular disease (ASCVD)   . Macular degeneration   . Tobacco abuse   . Asthma   . Palpitations   . Chest pain     Past Medical History:  Past Medical History:  Diagnosis Date  . Arteriosclerotic cardiovascular disease (ASCVD)    non-obstructive; negative stress nuclear and normal echo in 08/[facility] in 03/2000-20%  main 20% LAD, 30% circumflex, 30% RCA, hyperdynamic LV  . Asthma    Mild  . Chest pain   . Chronic back pain   . Diabetes mellitus    A1c of 7.6 in  04/2010; moderate to high dose insulin  . GERD (gastroesophageal reflux disease)   . Hyperlipidemia    total cholesterol of 271 and LDL of 187 in 07/2010  . Hypertension   . Macular degeneration   . Palpitations   . Stroke [facility]) 1993 2008   short term memory loss  . Tobacco abuse    Past Surgical History:  Past Surgical History:  Procedure Laterality Date  . APPENDECTOMY    . CATARACT EXTRACTION     left  . COLECTOMY WITH COLOSTOMY CREATION/HARTMANN PROCEDURE Left 09/29/2017   Procedure: EXTENDED LEFT HEMI COLECTOMY WITH COLOSTOMY CREATION (HARTMANN PROCEDURE);  Surgeon: Greer Pickerel, MD;  Location: [place];  Service: General;  Laterality: Left;  . COLONOSCOPY  2009  . COLONOSCOPY  03/30/2012   Procedure: COLONOSCOPY;  Surgeon: Rogene Houston, MD;  [facility];  Service: Endoscopy;  Laterality: N/A;  730  . COLONOSCOPY N/A 12/13/2013   Procedure: COLONOSCOPY;  Surgeon: Rogene Houston, MD;  [facility];  Service: Endoscopy;  Laterality: N/A;  1200  . ESOPHAGOGASTRODUODENOSCOPY  10/21/2011   Procedure: ESOPHAGOGASTRODUODENOSCOPY (EGD);  Surgeon: Rogene Houston, MD;  [facility];  Service: Endoscopy;  Laterality: N/A;  1200  . ESOPHAGOGASTRODUODENOSCOPY N/A 12/13/2013   Procedure: ESOPHAGOGASTRODUODENOSCOPY (EGD);  Surgeon: Rogene Houston, MD;  [facility];  Service: Endoscopy;  Laterality: N/A;  . LAPAROTOMY N/A 09/29/2017   Procedure: EXPLORATORY LAPAROTOMY;  Surgeon: Greer Pickerel, MD;  Location: [place];  Service: General;  Laterality: N/A;  . TUBAL LIGATION      Assessment & Plan Clinical Impression: Barbara [name] is a 77 year old right-handed female with history of diabetes mellitus, hypertension, PAF with history of GI bleed, chronic low back pain with radiculopathy bilateral lower extremities and recent admission for diverticulitis with abscess and discharged home on 09/25/2017.  Per chart review patient lives with spouse, who is legally  blind, and daughter is available to assist upon dischrage.  Independent prior to admission.  She was still driving doing her grocery shopping.  One level home with 5 steps to entry.  Presented to [facility] on 09/26/2017 with increasing abdominal pain as well as nausea vomiting and diarrhea.  Developed SVT requiring synchronized cardioversion but developed hypotension.  She was started on amiodarone and transferred to [facility] for management.  Supportive management recommended by EP and has been maintained on Tambocor.  Due to sepsis with diffuse peritonitis, surgical intervention recommended and underwent exploratory laparotomy with left and sigmoid colectomy colostomy by Dr. Redmond Pulling.  She tolerated extubation without difficulty but needed reintubation 10/02/2017 due to increasing shortness of breath and lethargy with delirium.  Postoperative course significant for intermittent hypotension with prolonged ileus, electrolyte abnormalities, and progressive leukocytosis.  She was later extubated 10/04/2017.  A follow-up CT of the abdomen and pelvis completed showing no recurrent abscess identified.  No obstructive changes.  Subcutaneous heparin for DVT prophylaxis.  Physical and occupational therapy evaluations completed with recommendations of physical medicine rehab consult.  Patient was admitted for a comprehensive rehab program 10/10/17. Patient transferred to CIR on 10/10/2017 .   Patient currently requires min with mobility secondary to muscle weakness, decreased cardiorespiratoy endurance and decreased oxygen support and decreased standing balance, decreased postural control and decreased balance strategies.  Prior to hospitalization, patient was independent  with mobility and lived with Spouse(spouse is legally blind) in a House home.  Home access is 5-6Stairs to enter.  Patient will benefit from skilled PT intervention to maximize safe functional mobility, minimize fall risk and decrease  caregiver burden for planned discharge home with intermittent assist.  Anticipate patient will benefit from follow up [facility] at discharge.  PT - End of Session Activity Tolerance: Tolerates < 10 min activity, no significant change in vital signs Endurance Deficit: Yes Endurance Deficit Description: able to complete mobility on RA, however once resting and dozing off O2 dropped to 87% PT Assessment Rehab Potential (ACUTE/IP ONLY): Good PT Barriers to Discharge: Decreased caregiver support PT Barriers to Discharge Comments: potential for new O2 at home PT Patient demonstrates impairments in the following area(s): Balance;Endurance;Motor;Safety PT Transfers Functional Problem(s): Bed Mobility;Bed to Chair;Car;Furniture PT Locomotion Functional Problem(s): Stairs;Ambulation PT Plan PT Intensity: Minimum of 1-2 x/day ,45 to 90 minutes PT Frequency: 5 out of 7 days PT Duration Estimated Length of Stay: 12-14 days PT Treatment/Interventions: Ambulation/gait training;Discharge planning;DME/adaptive equipment instruction;Functional mobility training;Pain management;Psychosocial support;Splinting/orthotics;Therapeutic Activities;UE/LE Strength taining/ROM;Wheelchair propulsion/positioning;UE/LE Coordination activities;Therapeutic Exercise;Stair training;Patient/family education;Neuromuscular re-education;Balance/vestibular training;Community reintegration PT Transfers Anticipated Outcome(s): mod I PT Locomotion Anticipated Outcome(s): supervision with LRAD PT Recommendation Recommendations for Other Services: Neuropsych consult;Therapeutic Recreation consult Therapeutic Recreation Interventions: Kitchen group;Stress management Follow Up Recommendations: Home health PT Patient destination: Home Equipment Recommended: To be determined  Skilled Therapeutic Intervention   PT Evaluation Precautions/Restrictions Precautions Precautions: Fall Precaution Comments: pt with wound VAC Restrictions Weight  Bearing Restrictions: No Vital Signs O2 maintained >90% on RA throughout session, except at end as pt beginning to doze off dropped to 87% so returned  pt to 2L O2 Pain Pain Assessment Pain Scale: 0-10 Pain Score: 3  Faces Pain Scale: Hurts even more Pain Type: Surgical pain Pain Location: Abdomen Pain Orientation: Mid Pain Descriptors / Indicators: Guarding Pain Onset: With Activity Pain Intervention(s): Emotional support;Repositioned Home Living/Prior Functioning Home Living Available Help at Discharge: Family;Available 24 hours/day(daughter will be able to assist some if needed) Type of Home: House Home Access: Stairs to enter [employer] of Steps: 5-6 Entrance Stairs-Rails: Left Home Layout: One level Bathroom Shower/Tub: Tub/shower unit;Curtain Bathroom Toilet: Standard Additional Comments: daughter states pt can come to her home if needed and then will have 1 step and a walk in shower.  Lives With: Spouse(spouse is legally blind) Prior Function Level of Independence: Independent with gait;Independent with transfers(pt would take rest breaks for back pain PRN)  Able to Take Stairs?: Yes Driving: Yes Vocation: Retired Comments: drove, did grocery shopping, did not walk long distances due to compressed spine with back pain.  Vision/Perception  Perception Perception: Within Functional Limits Praxis Praxis: Intact  Cognition Overall Cognitive Status: Impaired/Different from baseline Arousal/Alertness: Awake/alert Orientation Level: Oriented X4 Attention: Sustained Sustained Attention: Impaired(drowsy after mobility) Sustained Attention Impairment: Verbal basic;Functional basic Memory: Impaired Memory Impairment: Decreased recall of new information;Decreased short term memory;Retrieval deficit Decreased Short Term Memory: Verbal basic Awareness: Impaired Awareness Impairment: Emergent impairment Problem Solving: Impaired Problem Solving Impairment:  Functional complex;Verbal complex Safety/Judgment: Appears intact Sensation Sensation Light Touch: Appears Intact Stereognosis: Appears Intact Hot/Cold: Appears Intact Proprioception: Appears Intact Coordination Gross Motor Movements are Fluid and Coordinated: Yes Fine Motor Movements are Fluid and Coordinated: Yes Motor  Motor Motor - Skilled Clinical Observations: generalized weakness throughout due to prolonged illness  Mobility Bed Mobility Bed Mobility: Supine to Sit Supine to Sit: 5: Supervision;With rails;HOB elevated Supine to Sit Details: Verbal cues for technique;Verbal cues for precautions/safety Transfers Transfers: Yes Stand Pivot Transfers: 4: Min assist Stand Pivot Transfer Details: Verbal cues for technique;Verbal cues for safe use of DME/AE;Verbal cues for sequencing;Tactile cues for weight shifting Stand Pivot Transfer Details (indicate cue type and reason): with and without RW Locomotion  Ambulation Ambulation: No(will assess in PM session if able) Gait Gait: No Stairs / Additional Locomotion Stairs: No Wheelchair Mobility Wheelchair Mobility: No  Trunk/Postural Assessment  Cervical Assessment Cervical Assessment: (long history of neck pain, slight forward head noted) Thoracic Assessment Thoracic Assessment: (forward rounded shoulders) Lumbar Assessment Lumbar Assessment: (long history of back pain, able to sit with neutral pelvis but preference for posterior pelvic tilt) Postural Control Postural Control: Within Functional Limits  Balance Balance Balance Assessed: Yes Static Sitting Balance Static Sitting - Balance Support: Left upper extremity supported;Right upper extremity supported;Feet supported Static Sitting - Level of Assistance: 5: Stand by assistance Dynamic Sitting Balance Dynamic Sitting - Balance Support: Right upper extremity supported;Left upper extremity supported;Feet supported;During functional activity Dynamic Sitting - Level of  Assistance: 5: Stand by assistance Static Standing Balance Static Standing - Balance Support: Bilateral upper extremity supported;During functional activity Static Standing - Level of Assistance: 4: Min assist Dynamic Standing Balance Dynamic Standing - Balance Support: During functional activity;Bilateral upper extremity supported Dynamic Standing - Level of Assistance: 3: Mod assist Extremity Assessment  RUE Assessment RUE Assessment: Within Functional Limits LUE Assessment LUE Assessment: Within Functional Limits RLE Assessment RLE Assessment: Exceptions to WFL(generalized weakness noted with functional mobility observation, at least 3+/5) LLE Assessment LLE Assessment: Exceptions to WFL(generalized weakness noted with functional mobility observation, at least 3+/5)   See Function Navigator for Current Functional  Status.   Refer to Care Plan for Long Term Goals  Recommendations for other services: Neuropsych and Therapeutic Recreation  Kitchen group and Stress management  Discharge Criteria: Patient will be discharged from PT if patient refuses treatment 3 consecutive times without medical reason, if treatment goals not met, if there is a change in medical status, if patient makes no progress towards goals or if patient is discharged from hospital.  The above assessment, treatment plan, treatment alternatives and goals were discussed and mutually agreed upon: by patient and by family  Michel Santee 10/11/2017, 1:50 PM

## 2017-10-11 NOTE — Progress Notes (Signed)
Physical Therapy Session Note  Patient Details  Name: Barbara [name] MRN: [id number] Date of Birth: Jun 02, 1940  Today's Date: 10/11/2017 PT Individual Time: 2585-2778 PT Individual Time Calculation (min): 18 min   Short Term Goals: Week 1:  PT Short Term Goal 1 (Week 1): Pt will transfer with LRAD and supervision PT Short Term Goal 2 (Week 1): Pt will ambulate 100' with LRAD and min guard PT Short Term Goal 3 (Week 1): Pt will complete standardized balance assessment PT Short Term Goal 4 (Week 1): Pt will propel w/c 100' with BUEs for strengthening and cardiovascular endurance  Skilled Therapeutic Interventions/Progress Updates:  Reports pain is 8/10 and that she just got back to bed.  Declining out of bed mobility, but agreeable to review some bed level therex for her to perform later.  PT provided with general strengthening program and reviewed ankle circles, SAQ, heel slides, and hip abd/add to midline.  Pt declined to perform at this time but states she will attempt later.  Also declines intervention for pain.  Advised to discuss pain management with physician in the AM.  Left positioned to comfort with needs met.   Therapy Documentation Precautions:  Precautions Precautions: Fall Precaution Comments: pt with wound VAC Restrictions Weight Bearing Restrictions: No General: PT Amount of Missed Time (min): 12 Minutes PT Missed Treatment Reason: Patient fatigue;Pain   See Function Navigator for Current Functional Status.   Therapy/Group: Individual Therapy  Michel Santee 10/11/2017, 4:08 PM

## 2017-10-11 NOTE — Progress Notes (Signed)
[place] PHYSICAL MEDICINE & REHABILITATION     PROGRESS NOTE    Subjective/Complaints: Had a fair night. Not much appetite. Pain in abdomen better. Ready to get started in therapies today  ROS: Patient denies fever, rash, sore throat, blurred vision, nausea, vomiting, diarrhea, cough, shortness of breath or chest pain, joint or back pain, headache, or mood change.   Objective:  No results found. Recent Labs    10/10/17 0445 10/11/17 0704  WBC 13.3* 10.9*  HGB 9.6* 9.0*  HCT 29.6* 28.1*  PLT 437* 443*   Recent Labs    10/10/17 0445 10/11/17 0704  NA 135 135  K 4.2 4.0  CL 94* 94*  GLUCOSE 214* 257*  BUN 6 9  CREATININE 0.90 0.82  CALCIUM 8.0* 7.9*   CBG (last 3)  Recent Labs    10/10/17 2120 10/11/17 0054 10/11/17 0626  GLUCAP 212* 238* 244*    Wt Readings from Last 3 Encounters:  10/11/17 67 kg (147 lb 11.3 oz)  10/10/17 66.3 kg (146 lb 2.6 oz)  09/24/17 62.7 kg (138 lb 4.8 oz)     Intake/Output Summary (Last 24 hours) at 10/11/2017 0843 Last data filed at 10/11/2017 0430 Gross per 24 hour  Intake 240 ml  Output 750 ml  Net -510 ml    Vital Signs: Blood pressure (!) 115/39, pulse 66, temperature 98.9 F (37.2 C), temperature source Oral, resp. rate 17, height 5\' 4"  (1.626 m), weight 67 kg (147 lb 11.3 oz). Physical Exam:  Constitutional: No distress . Vital signs reviewed. HEENT: EOMI, oral membranes moist Neck: supple Cardiovascular: RRR without murmur. No JVD    Respiratory: CTA Bilaterally without wheezes or rales. Normal effort    GI: BS+, tender, ostomy sealed. Vac intaact Musculoskeletal:  No edema or tenderness in extremities  Neurological: She is alert.  Oriented 1 Motor: 4/5 in all 4, less proximally Skin: Skin is warm and dry.  Psychiatric: She has a normal mood and affect. Her behavior is normal.     Assessment/Plan: 1. Debility after multiple medical which require 3+ hours per day of interdisciplinary therapy in a  comprehensive inpatient rehab setting. Physiatrist is providing close team supervision and 24 hour management of active medical problems listed below. Physiatrist and rehab team continue to assess barriers to discharge/monitor patient progress toward functional and medical goals.  Function:  Bathing Bathing position      Bathing parts      Bathing assist        Upper Body Dressing/Undressing Upper body dressing                    Upper body assist        Lower Body Dressing/Undressing Lower body dressing                                  Lower body assist        Toileting Toileting          Toileting assist     Transfers Chair/bed transfer             Locomotion Ambulation           Wheelchair          Cognition Comprehension    Expression    Social Interaction    Problem Solving    Memory     Medical Problem List and Plan: 1.  Debility secondary  to peritonitis/sepsis.  Status post left and sigmoid colectomy with colostomy  -beginning therapies today 2.  DVT Prophylaxis/Anticoagulation: Subcutaneous heparin.  Monitor for any bleeding episodes 3. Pain Management: Oxycodone and Robaxin as needed 4. Mood: Provide emotional support 5. Neuropsych: This patient is not fully capable of making decisions on her own behalf. 6. Skin/Wound Care: Routine skin checks 7. Fluids/Electrolytes/Nutrition: discussed need for increased po intake  -ask RD to follow up  -I personally reviewed the patient's labs today. Low albumin-supp   8.  PAF.  Cardiac rate controlled.  Tambocor 100 mg twice daily 9.  Diabetes mellitus.  Hemoglobin A1c 6.4.  Lantus insulin 6 units daily.   blood sugars before meals and at bedtime  -poor control at present--follow for consistent pattern  -no increase in lantus yet given poor intake 10.  History of GI bleed.  Continue Protonix.  No bleeding episodes  -hgb 9.0   LOS (Days) 1 A FACE TO FACE EVALUATION WAS  PERFORMED  Meredith Staggers, MD 10/11/2017 8:43 AM

## 2017-10-11 NOTE — Evaluation (Signed)
Occupational Therapy Assessment and Plan  Patient Details  Name: Barbara [name] MRN: [id number] Date of Birth: 1941/01/25  OT Diagnosis: acute pain, cognitive deficits and muscle weakness (generalized) Rehab Potential: Rehab Potential (ACUTE ONLY): Good ELOS: 12-14 days   Today's Date: 10/11/2017 OT Individual Time: 1100-1200 OT Individual Time Calculation (min): 60 min     Problem List:  Patient Active Problem List   Diagnosis Date Noted  . Debility 10/10/2017  . Postoperative pain   . PAF (paroxysmal atrial fibrillation) ([place])   . Diabetes mellitus type 2 in nonobese (HCC)   . History of GI bleed   . Diabetic acidosis without coma ([place])   . Wide Complex Tachycardia/H/o SVT and H/o Afib 09/29/2017  . Sepsis ([place]) 09/29/2017  . Colonic diverticular abscess 09/27/2017  . Diverticulitis of both large and small intestine with perforation and abscess without bleeding 09/24/2017  . Unspecified atrial fibrillation ([place]) 09/23/2017  . Dizziness 07/28/2015  . Orthostatic hypotension 07/28/2015  . Rectal bleeding 12/05/2013  . Melena 12/05/2013  . Personal history of colonic polyps 12/05/2013  . Rapid atrial fibrillation ([place]) 08/15/2013  . Atrial fibrillation with RVR ([place]) 08/15/2013  . Right upper quadrant pain 11/16/2011  . Abdominal pain, acute, left lower quadrant 09/01/2011  . Cerebrovascular disease 10/09/2010  . Hypertension   . Hyperlipidemia   . Diabetes mellitus ([place])   . Arteriosclerotic cardiovascular disease (ASCVD)   . Macular degeneration   . Tobacco abuse   . Asthma   . Palpitations   . Chest pain     Past Medical History:  Past Medical History:  Diagnosis Date  . Arteriosclerotic cardiovascular disease (ASCVD)    non-obstructive; negative stress nuclear and normal echo in 08/2009 by [facility] in 03/2000-20%  main 20% LAD, 30% circumflex, 30% RCA, hyperdynamic LV  . Asthma    Mild  . Chest pain   . Chronic back pain   . Diabetes  mellitus    A1c of 7.6 in 04/2010; moderate to high dose insulin  . GERD (gastroesophageal reflux disease)   . Hyperlipidemia    total cholesterol of 271 and LDL of 187 in 07/2010  . Hypertension   . Macular degeneration   . Palpitations   . Stroke [facility]) 1993 2008   short term memory loss  . Tobacco abuse    Past Surgical History:  Past Surgical History:  Procedure Laterality Date  . APPENDECTOMY    . CATARACT EXTRACTION     left  . COLECTOMY WITH COLOSTOMY CREATION/HARTMANN PROCEDURE Left 09/29/2017   Procedure: EXTENDED LEFT HEMI COLECTOMY WITH COLOSTOMY CREATION (HARTMANN PROCEDURE);  Surgeon: Greer Pickerel, MD;  Location: [place];  Service: General;  Laterality: Left;  . COLONOSCOPY  2009  . COLONOSCOPY  03/30/2012   Procedure: COLONOSCOPY;  Surgeon: Rogene Houston, MD;  [facility];  Service: Endoscopy;  Laterality: N/A;  730  . COLONOSCOPY N/A 12/13/2013   Procedure: COLONOSCOPY;  Surgeon: Rogene Houston, MD;  [facility];  Service: Endoscopy;  Laterality: N/A;  1200  . ESOPHAGOGASTRODUODENOSCOPY  10/21/2011   Procedure: ESOPHAGOGASTRODUODENOSCOPY (EGD);  Surgeon: Rogene Houston, MD;  [facility];  Service: Endoscopy;  Laterality: N/A;  1200  . ESOPHAGOGASTRODUODENOSCOPY N/A 12/13/2013   Procedure: ESOPHAGOGASTRODUODENOSCOPY (EGD);  Surgeon: Rogene Houston, MD;  [facility];  Service: Endoscopy;  Laterality: N/A;  . LAPAROTOMY N/A 09/29/2017   Procedure: EXPLORATORY LAPAROTOMY;  Surgeon: Greer Pickerel, MD;  Location: [place];  Service: General;  Laterality: N/A;  . TUBAL LIGATION      Assessment & Plan Clinical Impression:  Barbara [name] is a 77 year old right-handed female with history of diabetes mellitus, hypertension, PAF with history of GI bleed, chronic low back pain with radiculopathy bilateral lower extremities and recent admission for diverticulitis with abscess and discharged home on 09/25/2017. Per chart review patient lives  with daughter. Independent prior to admission. She was still driving doing her grocery shopping. One level home with 5 steps to entry. [facility] to [facility] on [facility]2017 with increasing abdominal pain as well as nausea vomiting and diarrhea. Developed SVT requiring synchronized cardioversion but developed hypotension. She was started on amiodarone and transferred to [facility] for management. Supportive management recommended by EP and has been maintained on Tambocor. Due to sepsis with diffuse peritonitis, surgical intervention recommended and underwent exploratory laparotomy with left and sigmoid colectomy colostomy by Dr. Redmond Pulling. She tolerated extubation without difficulty but needed reintubation 10/02/2017 due to increasing shortness of breath and lethargy with delirium. Postoperative course significant for intermittent hypotension with prolonged ileus, electrolyte abnormalities and progressive leukocytosis. She was later extubated 10/04/2017. A follow-up CT of the abdomen and pelvis completed showing no recurrent abscess identified. No obstructive changes. Subcutaneous heparin for DVT prophylaxis. Physical and occupational therapy evaluations completed with recommendations of physical medicine rehab consult. Patient was admitted for a comprehensive rehab program.  Patient transferred to CIR on 10/10/2017 .    Patient currently requires mod with basic self-care skills secondary to muscle weakness, decreased cardiorespiratoy endurance and decreased oxygen support, decreased problem solving, decreased memory and delayed processing and decreased standing balance and decreased balance strategies.  Prior to hospitalization, patient could complete ADLs with independent with numerous rest breaks due to back pain.  Patient will benefit from skilled intervention to increase independence with basic self-care skills prior to discharge home with care partner.  Anticipate patient will require 24 hour  supervision and follow up home health.  OT - End of Session Activity Tolerance: Tolerates 10 - 20 min activity with multiple rests Endurance Deficit: Yes Endurance Deficit Description: on 2L of O2, fatigues easily OT Assessment Rehab Potential (ACUTE ONLY): Good OT Patient demonstrates impairments in the following area(s): Balance;Cognition;Endurance;Motor;Pain OT Basic ADL's Functional Problem(s): Grooming;Bathing;Dressing;Toileting OT Transfers Functional Problem(s): Toilet;Tub/Shower OT Additional Impairment(s): None OT Plan OT Intensity: Minimum of 1-2 x/day, 45 to 90 minutes OT Frequency: 5 out of 7 days OT Duration/Estimated Length of Stay: 12-14 days OT Treatment/Interventions: Balance/vestibular training;Cognitive remediation/compensation;Discharge planning;DME/adaptive equipment instruction;Functional mobility training;Patient/family education;Pain management;Psychosocial support;Self Care/advanced ADL retraining;Therapeutic Activities;Therapeutic Exercise;UE/LE Strength taining/ROM OT Self Feeding Anticipated Outcome(s): independent OT Basic Self-Care Anticipated Outcome(s): supervision overall OT Toileting Anticipated Outcome(s): supervision  OT Bathroom Transfers Anticipated Outcome(s): supervision OT Recommendation Patient destination: Home Follow Up Recommendations: Home health OT Equipment Recommended: Tub/shower bench;3 in 1 bedside comode   Skilled Therapeutic Intervention Pt seen for initial evaluation and ADL training with family education with pt and her daughter. Pt received in recliner and stated she needed to toilet. Pt worked on sit to stand and stand pivot with min A to [facility].  From sitting she was able to urinate and then bathe UB.  She requested to transfer back to recliner due to back pain. She then completed LB bathing with min A for buttocks.  Therapist left room to get a new brief and daughter donned pt's pants over feet and legs for her stating her mom was  too tired.  Pt then  stood up to RW with min A and pulled pants over her hips herself.  Pt very tired at this point and  Needed to rest in recliner. She began falling asleep, but was able to stay awake enough to finish evaluation. Briefly reviewed LOS, goals.   OT Evaluation Precautions/Restrictions  Precautions Precautions: Fall Precaution Comments: pt with wound VAC    Vital Signs Therapy Vitals Pulse Rate: 70 BP: (!) 120/50 Pain Pain Assessment Pain Scale: Faces Faces Pain Scale: Hurts even more Pain Type: Surgical pain Pain Location: Abdomen Pain Orientation: Mid Pain Descriptors / Indicators: Aching Pain Onset: With Activity Pain Intervention(s): Rest Home Living/Prior Functioning Home Living Family/patient expects to be discharged to:: Private residence Living Arrangements: Children Available Help at Discharge: Family, Available 24 hours/day Type of Home: House Home Access: Stairs to enter [employer] of Steps: 5-6 Entrance Stairs-Rails: Left Home Layout: One level Bathroom Shower/Tub: Tub/shower unit, Architectural technologist: Standard Additional Comments: daughter states pt can come to her home if needed and then will have 1 step and a walk in shower.  Lives With: Spouse(legally blind ) Prior Function Level of Independence: Independent with basic ADLs, Independent with homemaking with ambulation, Other (comment), Independent with transfers, Independent with gait(pt would take frequent sitting breaks due to back pain) Driving: Yes Vocation: Retired Comments: drove, did grocery shopping, did not walk long distances due to compressed spine with back pain.  ADL ADL ADL Comments: refer to functional navigator Vision Baseline Vision/History: Wears glasses Wears Glasses: At all times(for reading and watching TV) Patient Visual Report: No change from baseline Vision Assessment?: No apparent visual deficits Perception  Perception: Within Functional  Limits Praxis Praxis: Intact Cognition Overall Cognitive Status: Impaired/Different from baseline Arousal/Alertness: Awake/alert Orientation Level: Person;Place;Situation Person: Oriented Place: Oriented Situation: Oriented Year: 2019 Month: May Day of Week: Incorrect(Wednesday) Memory: Impaired Memory Impairment: Decreased recall of new information;Decreased short term memory;Retrieval deficit Decreased Short Term Memory: Verbal basic Immediate Memory Recall: Blue;Bed;Sock Memory Recall: Sock;Blue;Bed Memory Recall Sock: With Cue Memory Recall Blue: Without Cue Memory Recall Bed: Without Cue Attention: Sustained Sustained Attention: Impaired(difrting off to sleep 10 minute intervals) Sustained Attention Impairment: Verbal basic;Functional basic Awareness: Impaired Awareness Impairment: Emergent impairment Problem Solving: Impaired Problem Solving Impairment: Functional complex;Verbal complex Safety/Judgment: Appears intact Sensation Sensation Light Touch: Appears Intact Stereognosis: Appears Intact Hot/Cold: Appears Intact Proprioception: Appears Intact Coordination Gross Motor Movements are Fluid and Coordinated: Yes Fine Motor Movements are Fluid and Coordinated: Yes Motor  Motor Motor - Skilled Clinical Observations: generalized motor weakness Mobility    sit to stand and stand pivot min A Trunk/Postural Assessment  Cervical Assessment Cervical Assessment: (pt with long history of neck pain) Lumbar Assessment Lumbar Assessment: (pt with long history of back pain) Postural Control Postural Control: Within Functional Limits  Balance Static Sitting Balance Static Sitting - Level of Assistance: 5: Stand by assistance Dynamic Sitting Balance Dynamic Sitting - Level of Assistance: 5: Stand by assistance Static Standing Balance Static Standing - Level of Assistance: 4: Min assist Dynamic Standing Balance Dynamic Standing - Level of Assistance: 3: Mod  assist Extremity/Trunk Assessment RUE Assessment RUE Assessment: Within Functional Limits LUE Assessment LUE Assessment: Within Functional Limits   See Function Navigator for Current Functional Status.   Refer to Care Plan for Long Term Goals  Recommendations for other services: Neuropsych   Discharge Criteria: Patient will be discharged from OT if patient refuses treatment 3 consecutive times without medical reason, if treatment goals not met, if there is a  change in medical status, if patient makes no progress towards goals or if patient is discharged from hospital.  The above assessment, treatment plan, treatment alternatives and goals were discussed and mutually agreed upon: by patient and by family  SAGUIER,JULIA 10/11/2017, 12:49 PM
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
## 2017-10-11 NOTE — Patient Care Conference (Signed)
Inpatient RehabilitationTeam Conference and Plan of Care Update Date: 10/11/2017   Time: 2:20 PM    Patient Name: Barbara [name]      Medical Record Number: [id number]  Date of Birth: 11-24-1940 Sex: Female         Room/Bed: 4W06C/4W06C-01 Payor Info: Payor: [place] / Plan: BCBS MEDICARE / Product Type: *No Product type* /    Admitting Diagnosis: Debility  Admit Date/Time:  10/10/2017  5:12 PM Admission Comments: No comment available   Primary Diagnosis:  <principal problem not specified> Principal Problem: <principal problem not specified>  Patient Active Problem List   Diagnosis Date Noted  . Debility 10/10/2017  . Postoperative pain   . PAF (paroxysmal atrial fibrillation) ([place])   . Diabetes mellitus type 2 in nonobese (H[facility])   . History of GI bleed   . Diabetic acidosis without coma ([place])   . Wide Complex Tachycardia/H/o SVT and H/o Afib 09/29/2017  . Sepsis ([place]) 09/29/2017  . Colonic diverticular abscess 09/27/2017  . Diverticulitis of both large and small intestine with perforation and abscess without bleeding 09/24/2017  . Unspecified atrial fibrillation ([place]) 09/23/2017  . Dizziness 07/28/2015  . Orthostatic hypotension 07/28/2015  . Rectal bleeding 12/05/2013  . Melena 12/05/2013  . Personal history of colonic polyps 12/05/2013  . Rapid atrial fibrillation ([place]) 08/15/2013  . Atrial fibrillation with RVR ([place]) 08/15/2013  . Right upper quadrant pain 11/16/2011  . Abdominal pain, acute, left lower quadrant 09/01/2011  . Cerebrovascular disease 10/09/2010  . Hypertension   . Hyperlipidemia   . Diabetes mellitus ([place])   . Arteriosclerotic cardiovascular disease (ASCVD)   . Macular degeneration   . Tobacco abuse   . Asthma   . Palpitations   . Chest pain     Expected Discharge Date: Expected Discharge Date: 10/25/17  Team Members Present: Physician leading conference: Dr. Alger Simons Social Worker Present: Lennart Pall, LCSW Nurse  Present: Other (comment)(Caroline Burchett, RN) PT Present: Dwyane Dee, PT OT Present: Roanna Epley, Verdie Mosher, OT SLP Present: Charolett Bumpers, SLP PPS Coordinator present : Ileana Ladd, PT     Current Status/Progress Goal Weekly Team Focus  Medical   Patient admitted after peritonitis  and sepsis with subsequent bowel surgery and colostomy.  Patient with ongoing VAC treatment to abdominal wound  Improve activity tolerance and reduce pain  Nutrition, wound care, pain control   Bowel/Bladder   colostomy; LBM 10/07/17; purewick in place at present, per daughter patient in incontinent bladder  monitor colostomy; min assist  assess colostomy qshift and prn   Swallow/Nutrition/ Hydration             ADL's   min A with LB self care, standing balance and transfers, s/u UB self care, on O2 support  supervision overall, mod I grooming and UB self care  ADL trng, activity tolerance, balance, pt/family education   Mobility   min assist for transfers, will assess gait in PM PT session but will likely be min assist for short distance  supervision for gait, mod I transfers  progressing all mobility, activity tolerance, strengthening, balance   Communication             Safety/Cognition/ Behavioral Observations  Mod A, Max A emergent   Mod I  sustained attention, recall novel/recent, mildly complex problem solving, emergent awareness   Pain   pain somwhat controlled with current pain regimen; PRN Robaxin; PRN oxycodone  pain 4/10  assess pain qshift and PRN; medicate PRN  and monitor for effectiveness   Skin   midline incision to abdomen with wound vac; colostomy; no s/s of infection, no breakdown noted  remain infection/breakdown free  assess skin qshift and PRN; change dressings as needed/contact wound care as needed    Rehab Goals Patient on target to meet rehab goals: Yes *See Care Plan and progress notes for long and short-term goals.     Barriers to Discharge  Current  Status/Progress Possible Resolutions Date Resolved   Physician    Wound Care;Medical stability        Continues to manage his nutrition wounds and postsurgical considerations as outlined in the chart      Nursing                  PT  Decreased caregiver support  potential for new O2 at home              OT                  SLP                SW                Discharge Planning/Teaching Needs:  Pt has option to return to her own home or with her daughter.  Family able to provide 24/7 assistance.  Teaching to be planned closer to d/c.   Team Discussion:  New admit;  Multiple medical issues;  Anticipate home with wound VAC.  MD to add appetite stimulant.  Patient overall mod assist on therapy evals with supervision to mod ind goals.  ST reports decreased sustained attention and mild cognitive issues.  Revisions to Treatment Plan:  None    Continued Need for Acute Rehabilitation Level of Care: The patient requires daily medical management by a physician with specialized training in physical medicine and rehabilitation for the following conditions: Daily direction of a multidisciplinary physical rehabilitation program to ensure safe treatment while eliciting the highest outcome that is of practical value to the patient.: Yes Daily medical management of patient stability for increased activity during participation in an intensive rehabilitation regime.: Yes Daily analysis of laboratory values and/or radiology reports with any subsequent need for medication adjustment of medical intervention for : Post surgical problems  Duwan Adrian 10/11/2017, 5:03 PM

## 2017-10-11 NOTE — Progress Notes (Signed)
Initial Nutrition Assessment  DOCUMENTATION CODES:   Not applicable  INTERVENTION:  Provide Premier Protein po TID, each supplement provides 160 kcal and 30 grams of protein.   Provide nourishment snacks. RD has ordered  Encourage adequate PO intake.   NUTRITION DIAGNOSIS:   Inadequate oral intake related to poor appetite as evidenced by per patient/family report.  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 Presents with increasing abdominal pain as well as nausea vomiting and diarrhea. Due to sepsis with diffuse peritonitis, surgical intervention recommended and underwent exploratory laparotomy with left and sigmoid colectomy colostomy  Pt reports having a decreased appetite since admission. Pt reports she was eating well at home with usual intake of at least 3 meals a day. Family at bedside. They have been encouraging PO intake at meals. Family reports po intake at recent meals have been poor. Pt with no weight loss per weight records. RD to order nutritional supplements to aid in caloric and protein needs. Pt refused Ensure due to high carbohydrate/sugar content. RD to order premier protein shakes. RD to additionally order nourishment snacks. Snack preferences taken. Labs and medications reviewed.   NUTRITION - FOCUSED PHYSICAL EXAM:    Most Recent Value  Orbital Region  Unable to assess  Upper Arm Region  No depletion  Thoracic and Lumbar Region  No depletion  Buccal Region  Unable to assess  Temple Region  Mild depletion  Clavicle Bone Region  No depletion  Clavicle and  Acromion Bone Region  No depletion  Scapular Bone Region  Unable to assess  Dorsal Hand  Unable to assess  Patellar Region  No depletion  Anterior Thigh Region  Moderate depletion  Posterior Calf Region  No depletion  Edema (RD Assessment)  Mild  Hair  Reviewed  Eyes  Reviewed  Mouth  Reviewed  Skin  Reviewed  Nails  Reviewed       Diet Order:   Diet Order           Diet Carb Modified Fluid consistency: Thin; Room service appropriate? Yes  Diet effective now          EDUCATION NEEDS:   Not appropriate for education at this time  Skin:  Skin Assessment: Skin Integrity Issues: Skin Integrity Issues:: Incisions, Wound VAC Wound Vac: abdomen Incisions: abdomen  Last BM:  5/21-colostomy  Height:   Ht Readings from Last 1 Encounters:  10/10/17 5\' 4"  (1.626 m)    Weight:   Wt Readings from Last 1 Encounters:  10/11/17 147 lb 11.3 oz (67 kg)    Ideal Body Weight:  54.54 kg  BMI:  Body mass index is 25.35 kg/m.  Estimated Nutritional Needs:   Kcal:  1650-1850  Protein:  85-95 grams  Fluid:  1.6 - 1.8 L/day    Corrin Parker, MS, RD, LDN Pager # [phone] After hours/ weekend pager # [phone]

## 2017-10-12 ENCOUNTER — Inpatient Hospital Stay (HOSPITAL_COMMUNITY): Payer: Medicare Other | Admitting: Physical Therapy

## 2017-10-12 ENCOUNTER — Inpatient Hospital Stay (HOSPITAL_COMMUNITY): Payer: Medicare Other

## 2017-10-12 LAB — GLUCOSE, CAPILLARY
GLUCOSE-CAPILLARY: 174 mg/dL — AB (ref 65–99)
Glucose-Capillary: 208 mg/dL — ABNORMAL HIGH (ref 65–99)
Glucose-Capillary: 236 mg/dL — ABNORMAL HIGH (ref 65–99)
Glucose-Capillary: 164 mg/dL — ABNORMAL HIGH (ref 65–99)

## 2017-10-12 MED ORDER — INSULIN GLARGINE 100 UNIT/ML ~~LOC~~ SOLN
10.0000 [IU] | Freq: Every day | SUBCUTANEOUS | Status: DC
  Administered 2017-10-12 – 2017-10-17 (×6): 10 [IU] via SUBCUTANEOUS
  Filled 2017-10-12 (×6): qty 0.1

## 2017-10-12 MED ORDER — MEGESTROL ACETATE 400 MG/10ML PO SUSP: 400.0000 mg | Freq: Two times a day (BID) | ORAL | Status: DC

## 2017-10-12 NOTE — Consult Note (Signed)
[place] Nurse wound follow up Wound type:Midline incision Measurement:12cm x 3cm x 2.4cm Wound bed:100% beefy red Drainage (amount, consistency, odor) small amt of tan drainage in canister Periwound:intact with small amt MASD a distal end of wound Dressing procedure/placement/frequency:Applied 1 piece of black foam to incisional wound, covered with drape, applied suction at 125 mmHg continuous suction, patient tolerated well. [place] team will see Friday to change both NPWT dressing and ostomy. Daughter at bedside, plan discussed.  [place] Nurse ostomy follow up Stoma type/location: LUQ colostomy Stomal assessment/size: 1 1/4" slightly oval. Dark colored slough attached to most of stoma. Washed with wash cloth where most of slough came off.  Peristomal assessment: intact Treatment options for stomal/peristomal skin: barrier ring Output small amount of brown liquid effluent in pouch Ostomy pouching: 2pc.  Education provided:Daughter present, watched change and asked appropriate questions. She was concerned with darkened slough and was relieved when it was easily removed.  Enrolled patient in [place] Start Discharge program: No, enrolled on prior visit. [place] team will return Friday for both ostomy change (due to close proximity of wound) and NPWT dressing change.  Fara Olden, RN-C, WTA-C, [place] Wound Treatment Associate Ostomy Care Associate

## 2017-10-12 NOTE — Progress Notes (Signed)
Physical Therapy Session Note  Patient Details  Name: Barbara [name] MRN: [id number] Date of Birth: March 09, 1941  Today's Date: 10/12/2017 PT Individual Time: 1400-1510 PT Individual Time Calculation (min): 70 min   Short Term Goals: Week 1:  PT Short Term Goal 1 (Week 1): Pt will transfer with LRAD and supervision PT Short Term Goal 2 (Week 1): Pt will ambulate 100' with LRAD and min guard PT Short Term Goal 3 (Week 1): Pt will complete standardized balance assessment PT Short Term Goal 4 (Week 1): Pt will propel w/c 100' with BUEs for strengthening and cardiovascular endurance  Skilled Therapeutic Interventions/Progress Updates:    c/o pain, does not rate, but states she will not get out of bed until she has pain medication. While awaiting medication, PT discussed goals of care and importance of mobilizing to help with pain management.  Session focus on education and functional mobility.    Pt requires supervision and increased time to come to EOB with HOB fully raised and bed rails.  LB dressing from EOB with total assist, sit<>stand with min assist to pull pants over hips. Daughter present, PT educated on not over assisting in order for pt to maximize recovery.  Stand/pivot throughout session with min assist with and without RW.  Gait training with RW x15' with min assist.  Pt returned to room at end of session, positioned in recliner with call bell in reach and needs met.   Therapy Documentation Precautions:  Precautions Precautions: Fall Precaution Comments: pt with wound VAC Restrictions Weight Bearing Restrictions: No   See Function Navigator for Current Functional Status.   Therapy/Group: Individual Therapy  Michel Santee 10/12/2017, 4:29 PM

## 2017-10-12 NOTE — Progress Notes (Signed)
Speech Language Pathology Daily Session Note  Patient Details  Name: Barbara [name] MRN: [id number] Date of Birth: 10/19/1940  Today's Date: 10/12/2017 SLP Individual Time: 1300-1330 SLP Individual Time Calculation (min): 30 min  Short Term Goals: Week 1: SLP Short Term Goal 1 (Week 1): Pt will demonstrate sustained attention to functional tasks for 20 minutes with min A verbal cues for redirection. SLP Short Term Goal 2 (Week 1): Pt utilize external memory aids to recall new, daily information with min A verbal and question cues.  SLP Short Term Goal 3 (Week 1): Pt will demonstrate funtional problem solving for basic and familiar (mildly complex) tasks with min A verbal cues. SLP Short Term Goal 4 (Week 1): Pt will self-mointor and correct functional errors during problem soving tasks with mod A verbal cues.   Skilled Therapeutic Interventions:Skilled ST services focused on cognitive skills. Pt had visitors present and was initially reluctant to participate in therapy, however agree once visitor offered to leave the room. SLP facilitated basic problem solving, recall and sustained attention utilizing novel card game, blink, pt required initially max A verbal cues fading to mod A verbal cues for problem solving and recall utilizing external aid. Pt demonstrated sustained attention with mod A verbal cues during 20 minute task. Pt was left in room with call bell within reach. Reccomend to continue skilled ST services.      Function:  Eating Eating Eating activity did not occur: Refused               Cognition Comprehension Comprehension assist level: Follows basic conversation/direction with extra time/assistive device  Expression   Expression assist level: Expresses basic needs/ideas: With no assist  Social Interaction Social Interaction assist level: Interacts appropriately 75 - 89% of the time - Needs redirection for appropriate language or to initiate interaction.  Problem Solving  Problem solving assist level: Solves basic 50 - 74% of the time/requires cueing 25 - 49% of the time;Solves basic 25 - 49% of the time - needs direction more than half the time to initiate, plan or complete simple activities  Memory Memory assist level: Recognizes or recalls 50 - 74% of the time/requires cueing 25 - 49% of the time;Recognizes or recalls 25 - 49% of the time/requires cueing 50 - 75% of the time    Pain Pain Assessment Pain Scale: Faces Pain Score: 0-No pain Faces Pain Scale: Hurts even more Pain Type: Acute pain;Surgical pain Pain Location: Abdomen Pain Orientation: Mid Pain Descriptors / Indicators: Aching Pain Onset: On-going Pain Intervention(s): Medication (See eMAR)  Therapy/Group: Individual Therapy  Arlin Sass  [facility] 10/12/2017, 4:35 PM

## 2017-10-12 NOTE — Progress Notes (Signed)
Occupational Therapy Session Note  Patient Details  Name: Barbara [name] MRN: [id number] Date of Birth: 11/03/1940  Today's Date: 10/12/2017 OT Individual Time: 9038-3338 OT Individual Time Calculation (min): 55 min    Short Term Goals: Week 1:  OT Short Term Goal 1 (Week 1): Pt will be able to tolerate standing for 2-3 minutes to be able to complete grooming in standing.  OT Short Term Goal 2 (Week 1): Pt will be able to don pants with steadying A. OT Short Term Goal 3 (Week 1): pt will be able to wash bottom with steadying A. OT Short Term Goal 4 (Week 1): Pt will be able to transfer on and off toilet with with RW with steadying A.  Skilled Therapeutic Interventions/Progress Updates:    Pt received supine agreeable to therapy with encouragement. Pt encouraged to eat breakfast but refused after multiple attempts. Extensive education provided to pt and family re symptomology and importance of eating on performance/energy levels. Skilled monitoring of vitals throughout session, with pt not wearing [place] upon entering room and SpO2 at 87%. Pt donned Ida at 2L and remained at 94% throughout session. Pt required frequent rest breaks throughout session and vc for breathing techniques. Pt washed UB with set up, and required min A for peri-hygiene during standing. Pt completed stand pivot transfer to [facility] with min A, requiring vc for technique/RW management. Pt returned to bed with min A for lifting legs and positioning. Edu provided re skin integrity and pressure relief positioning d/t redness observed on bottom. Pt left with bed alarm set, and daughter present in room.   Therapy Documentation Precautions:  Precautions Precautions: Fall Precaution Comments: pt with wound VAC Restrictions Weight Bearing Restrictions: No   Vital Signs: Therapy Vitals Pulse Rate: 68 BP: (!) 141/56 Pain: Pain Assessment Pain Scale: 0-10 Pain Score: 7  Pain Type: Surgical pain;Acute pain Pain Location:  Abdomen Pain Orientation: Mid Pain Descriptors / Indicators: Aching Pain Onset: On-going Pain Intervention(s): RN made aware;Repositioned;Emotional support ADL: ADL ADL Comments: refer to functional navigator  See Function Navigator for Current Functional Status.   Therapy/Group: Individual Therapy  Curtis Sites 10/12/2017, 12:45 PM

## 2017-10-12 NOTE — Progress Notes (Signed)
Occupational Therapy Note  Patient Details  Name: Barbara [name] MRN: [id number] Date of Birth: 05-26-1940  Today's Date: 10/12/2017 OT Individual Time: 4680-3212 OT Individual Time Calculation (min): 55 min   Pt c/o abdominal pain (unrated) but stated it was currently manageable Individual Therapy  Pt resting in bed upon arrival with daughter present.  Pt lethargic throughout session and daughter stated she had just received "oxy and muscle relaxer." Pt initially engaged in bed mobility activities-rolling and supine<>sit with mod A.  Pt engaged in BUE therex with 1# dumbbells and 3# bar.  Pt also engaged in PNF with unweighted ball.  Pt required min verbal cues to keep eyes open.  Pt remained in bed with all needs within reach and daughter present.    [name] [facility] 10/12/2017, 10:35 AM

## 2017-10-12 NOTE — Progress Notes (Signed)
[facility] PHYSICAL MEDICINE & REHABILITATION     PROGRESS NOTE    Subjective/Complaints: Woke up with pain this morning. Oxycodone helped to reduce pain. Up with OT and had no problems. Sitting EOB when I arrived. Daughter asked about DM control  ROS: Patient denies fever, rash, sore throat, blurred vision,   vomiting, diarrhea, cough, shortness of breath or chest pain,  headache, or mood change.    Objective:  No results found. Recent Labs    10/10/17 0445 10/11/17 0704  WBC 13.3* 10.9*  HGB 9.6* 9.0*  HCT 29.6* 28.1*  PLT 437* 443*   Recent Labs    10/10/17 0445 10/11/17 0704  NA 135 135  K 4.2 4.0  CL 94* 94*  GLUCOSE 214* 257*  BUN 6 9  CREATININE 0.90 0.82  CALCIUM 8.0* 7.9*   CBG (last 3)  Recent Labs    10/11/17 0054 10/11/17 0626 10/11/17 1142  GLUCAP 238* 244* 245*    Wt Readings from Last 3 Encounters:  10/12/17 67.2 kg (148 lb 2.4 oz)  10/10/17 66.3 kg (146 lb 2.6 oz)  09/24/17 62.7 kg (138 lb 4.8 oz)     Intake/Output Summary (Last 24 hours) at 10/12/2017 0901 Last data filed at 10/11/2017 2037 Gross per 24 hour  Intake 240 ml  Output 0 ml  Net 240 ml    Vital Signs: Blood pressure (!) 141/56, pulse 68, temperature 99.4 F (37.4 C), temperature source Oral, resp. rate 16, height 5\' 4"  (1.626 m), weight 67.2 kg (148 lb 2.4 oz), SpO2 92 %. Physical Exam:  Constitutional: No distress . Vital signs reviewed. HEENT: EOMI, oral membranes moist Neck: supple Cardiovascular: RRR without murmur. No JVD    Respiratory: CTA Bilaterally without wheezes or rales. Normal effort    GI: BS +, tender, +vac,ostomy Musculoskeletal:  No edema or tenderness in extremities  Neurological: She is alert.  Oriented 3 Motor: 4/5 in all 4, less proximally Skin: Skin is warm and dry.  Psychiatric: She has a normal mood and affect. Her behavior is normal.     Assessment/Plan: 1. Debility after multiple medical which require 3+ hours per day of  interdisciplinary therapy in a comprehensive inpatient rehab setting. Physiatrist is providing close team supervision and 24 hour management of active medical problems listed below. Physiatrist and rehab team continue to assess barriers to discharge/monitor patient progress toward functional and medical goals.  Function:  Bathing Bathing position   Position: Other (comment)(sitting on BSC and then in recliner)  Bathing parts Body parts bathed by patient: Right arm, Left arm, Chest, Abdomen, Front perineal area, Right upper leg, Left upper leg, Right lower leg, Left lower leg Body parts bathed by helper: Buttocks, Back  Bathing assist Assist Level: Touching or steadying assistance(Pt > 75%)      Upper Body Dressing/Undressing Upper body dressing   What is the patient wearing?: Pull over shirt/dress     Pull over shirt/dress - Perfomed by patient: Thread/unthread right sleeve, Thread/unthread left sleeve, Put head through opening, Pull shirt over trunk          Upper body assist        Lower Body Dressing/Undressing Lower body dressing   What is the patient wearing?: Underwear, Pants, Shoes Underwear - Performed by patient: Pull underwear up/down Underwear - Performed by helper: Thread/unthread right underwear leg, Thread/unthread left underwear leg Pants- Performed by patient: Pull pants up/down Pants- Performed by helper: Thread/unthread left pants leg, Thread/unthread right pants leg  Shoes - Performed by patient: Don/doff right shoe, Don/doff left shoe            Lower body assist Assist for lower body dressing: Touching or steadying assistance (Pt > 75%)      Toileting Toileting   Toileting steps completed by patient: Performs perineal hygiene Toileting steps completed by helper: Adjust clothing prior to toileting, Adjust clothing after toileting    Toileting assist     Transfers Chair/bed transfer   Chair/bed transfer method: Stand pivot Chair/bed  transfer assist level: Touching or steadying assistance (Pt > 75%) Chair/bed transfer assistive device: Armrests, Water engineer Comprehension Comprehension assist level: Follows basic conversation/direction with extra time/assistive device  Expression Expression assist level: Expresses basic needs/ideas: With no assist  Social Interaction Social Interaction assist level: Interacts appropriately 75 - 89% of the time - Needs redirection for appropriate language or to initiate interaction.  Problem Solving Problem solving assist level: Solves basic 50 - 74% of the time/requires cueing 25 - 49% of the time  Memory Memory assist level: Recognizes or recalls 50 - 74% of the time/requires cueing 25 - 49% of the time   Medical Problem List and Plan: 1.  Debility secondary to peritonitis/sepsis.  Status post left and sigmoid colectomy with colostomy  -continue therapies 2.  DVT Prophylaxis/Anticoagulation: Subcutaneous heparin.  Monitor for any bleeding episodes 3. Pain Management: Oxycodone and Robaxin as needed  -pain at times but improving. Prn meds effective. Need to pretreat before activities 4. Mood: Provide emotional support 5. Neuropsych: This patient is not fully capable of making decisions on her own behalf. 6. Skin/Wound Care: Routine skin checks 7. Fluids/Electrolytes/Nutrition: discussed need for increased po intake  -appreciate RD follow up  -continue protein supp    -begin megace 400mg  bid. Discussed with pt/daughter 8.  PAF.  Cardiac rate controlled.  Tambocor 100 mg twice daily  -watch for increased HR in therapies 9.  Diabetes mellitus.  Hemoglobin A1c 6.4.  Lantus insulin 6 units daily.   blood sugars before meals and at bedtime  -as sugars remain consistently elevated, I will increase lantus to 10u  -continue SSI 10.  History of GI bleed.  Continue Protonix.  No bleeding episodes  -hgb 9.0   LOS (Days)  2 A FACE TO FACE EVALUATION WAS PERFORMED  Meredith Staggers, MD 10/12/2017 9:01 AM

## 2017-10-13 ENCOUNTER — Inpatient Hospital Stay (HOSPITAL_COMMUNITY): Payer: Medicare Other

## 2017-10-13 ENCOUNTER — Inpatient Hospital Stay (HOSPITAL_COMMUNITY): Payer: Medicare Other | Admitting: Speech Pathology

## 2017-10-13 ENCOUNTER — Inpatient Hospital Stay (HOSPITAL_COMMUNITY): Payer: Medicare Other | Admitting: Physical Therapy

## 2017-10-13 DIAGNOSIS — E43 Unspecified severe protein-calorie malnutrition: Secondary | ICD-10-CM

## 2017-10-13 LAB — GLUCOSE, CAPILLARY
GLUCOSE-CAPILLARY: 153 mg/dL — AB (ref 65–99)
Glucose-Capillary: 160 mg/dL — ABNORMAL HIGH (ref 65–99)
Glucose-Capillary: 205 mg/dL — ABNORMAL HIGH (ref 65–99)
Glucose-Capillary: 187 mg/dL — ABNORMAL HIGH (ref 65–99)

## 2017-10-13 NOTE — Progress Notes (Signed)
Social Work  Social Work Assessment and Plan  Patient Details  Name: Barbara [name] MRN: [id number] Date of Birth: 11-13-1940  Today's Date: 10/13/2017  Problem List:  Patient Active Problem List   Diagnosis Date Noted  . Debility 10/10/2017  . Postoperative pain   . PAF (paroxysmal atrial fibrillation) ([place])   . Diabetes mellitus type 2 in nonobese (HCC)   . History of GI bleed   . Diabetic acidosis without coma ([place])   . Wide Complex Tachycardia/H/o SVT and H/o Afib 09/29/2017  . Sepsis ([place]) 09/29/2017  . Colonic diverticular abscess 09/27/2017  . Diverticulitis of both large and small intestine with perforation and abscess without bleeding 09/24/2017  . Unspecified atrial fibrillation ([place]) 09/23/2017  . Dizziness 07/28/2015  . Orthostatic hypotension 07/28/2015  . Rectal bleeding 12/05/2013  . Melena 12/05/2013  . Personal history of colonic polyps 12/05/2013  . Rapid atrial fibrillation ([place]) 08/15/2013  . Atrial fibrillation with RVR ([place]) 08/15/2013  . Right upper quadrant pain 11/16/2011  . Abdominal pain, acute, left lower quadrant 09/01/2011  . Cerebrovascular disease 10/09/2010  . Hypertension   . Hyperlipidemia   . Diabetes mellitus ([place])   . Arteriosclerotic cardiovascular disease (ASCVD)   . Macular degeneration   . Tobacco abuse   . Asthma   . Palpitations   . Chest pain    Past Medical History:  Past Medical History:  Diagnosis Date  . Arteriosclerotic cardiovascular disease (ASCVD)    non-obstructive; negative stress nuclear and normal [facility] in 08/[facility] in 03/2000-20%  main 20% LAD, 30% circumflex, 30% RCA, hyperdynamic LV  . Asthma    Mild  . Chest pain   . Chronic back pain   . Diabetes mellitus    A1c of 7.6 in 04/2010; moderate to high dose insulin  . GERD (gastroesophageal reflux disease)   . Hyperlipidemia    total cholesterol of 271 and LDL of 187 in 07/2010  . Hypertension   . Macular degeneration   .  Palpitations   . Stroke [facility]) 1993 2008   short term memory loss  . Tobacco abuse    Past Surgical History:  Past Surgical History:  Procedure Laterality Date  . APPENDECTOMY    . CATARACT EXTRACTION     left  . COLECTOMY WITH COLOSTOMY CREATION/HARTMANN PROCEDURE Left 09/29/2017   Procedure: EXTENDED LEFT HEMI COLECTOMY WITH COLOSTOMY CREATION (HARTMANN PROCEDURE);  Surgeon: Greer Pickerel, MD;  Location: [place];  Service: General;  Laterality: Left;  . COLONOSCOPY  2009  . COLONOSCOPY  03/30/2012   Procedure: COLONOSCOPY;  Surgeon: Rogene Houston, MD;  [facility];  Service: Endoscopy;  Laterality: N/A;  730  . COLONOSCOPY N/A 12/13/2013   Procedure: COLONOSCOPY;  Surgeon: Rogene Houston, MD;  [facility];  Service: Endoscopy;  Laterality: N/A;  1200  . ESOPHAGOGASTRODUODENOSCOPY  10/21/2011   Procedure: ESOPHAGOGASTRODUODENOSCOPY (EGD);  Surgeon: Rogene Houston, MD;  [facility];  Service: Endoscopy;  Laterality: N/A;  1200  . ESOPHAGOGASTRODUODENOSCOPY N/A 12/13/2013   Procedure: ESOPHAGOGASTRODUODENOSCOPY (EGD);  Surgeon: Rogene Houston, MD;  [facility];  Service: Endoscopy;  Laterality: N/A;  . LAPAROTOMY N/A 09/29/2017   Procedure: EXPLORATORY LAPAROTOMY;  Surgeon: Greer Pickerel, MD;  Location: [place];  Service: General;  Laterality: N/A;  . TUBAL LIGATION     Social History:  reports that she has been smoking cigarettes.  She has a 25.00 pack-year smoking history. She has never  used smokeless tobacco. She reports that she does not drink alcohol or use drugs.  Family / Support Systems Marital Status: Married How Long?: 71 yrs Patient Roles: Spouse, Parent Spouse/Significant Other: Kathye Cipriani @ (H) [phone] Children: daughter, Gwenlyn Fudge [facility]) @ [phone] or [phone] Other Supports: granddaughter, Mickel Fuchs [facility]) @ [phone] Anticipated Caregiver: Daughter and granddaughter   Ability/Limitations of Caregiver: Pt's spouse is blind and unable to assist.  Daughter is not working and has offered her home. Caregiver Availability: 24/7 Family Dynamics: Pt reports that her spouse is not supportive and notes, "my daughter and him don't get along too good either."  Pt actually "rolls" her eyes when she speaks of spouse limited support.  Social History Preferred language: English Religion: Baptist Cultural Background: NA Read: Yes Write: Yes Employment Status: Retired Freight forwarder Issues: None Guardian/Conservator: None - per MD, pt is not fuly capable of making decisions on her own behalf.  Defer to spouse/ daughter.   Abuse/Neglect Abuse/Neglect Assessment Can Be Completed: Yes Physical Abuse: Denies Verbal Abuse: Denies Sexual Abuse: Denies Exploitation of patient/patient's resources: Denies Self-Neglect: Denies  Emotional Status Pt's affect, behavior adn adjustment status: Pt lying in bed and offers only brief responses to my questions.  Flat affect and very little eye contact.  Difficult to engage.  Pt denies any significant emotional distress, however, may benefit from neuropsychology consult - will monitor. Recent Psychosocial Issues: None Pyschiatric History: None Substance Abuse History: None  Patient / Family Perceptions, Expectations & Goals Pt/Family understanding of illness & functional limitations: Pt with general understanding of her sepsis, debility and current functional limitations/ need for CIR. Premorbid pt/family roles/activities: Pt was independent PTA. Anticipated changes in roles/activities/participation: Per goals of supervision, daughter may need to assume primary caregiver as spouse is legally blind and unable to assist per pt. Pt/family expectations/goals: "I don't know what my goal is for.  Hope I can get back home."  [place] Agencies: None Premorbid Home Care/DME Agencies: None Transportation  available at discharge: yes Resource referrals recommended: Neuropsychology  Discharge Planning Living Arrangements: Spouse/significant other Support Systems: Children, Other relatives Type of Residence: Private residence Insurance Resources: Medicare(Blue Medicare) Financial Resources: Social Security Financial Screen Referred: No Living Expenses: Own Money Management: Patient Does the patient have any problems obtaining your medications?: No Home Management: pt Patient/Family Preliminary Plans: Pt considering possibly discharging to daughter's home where she can provide 24/7 support. Social Work Anticipated Follow Up Needs: HH/OP Expected length of stay: 12-14 days  Clinical Impression  Unfortunate woman here following acute hospitalization for sepsis, colostomy and now on CIR for debility.  Reports that spouse is legally blind and not much support for her.  Notes daughter is very supportive and considering going to her home upon d/c.  Pt with very flat affect and difficult to engage.  Will benefit from neuropsychology involvement.  Will follow for support and d/c planning needs. Giannina Bartolome 10/13/2017, 4:21 PM

## 2017-10-13 NOTE — Progress Notes (Signed)
Speech Language Pathology Daily Session Note  Patient Details  Name: Barbara [name] MRN: [id number] Date of Birth: 1940-06-10  Today's Date: 10/13/2017 SLP Individual Time: 0830-0930 SLP Individual Time Calculation (min): 60 min  Short Term Goals: Week 1: SLP Short Term Goal 1 (Week 1): Pt will demonstrate sustained attention to functional tasks for 20 minutes with min A verbal cues for redirection. SLP Short Term Goal 2 (Week 1): Pt utilize external memory aids to recall new, daily information with min A verbal and question cues.  SLP Short Term Goal 3 (Week 1): Pt will demonstrate funtional problem solving for basic and familiar (mildly complex) tasks with min A verbal cues. SLP Short Term Goal 4 (Week 1): Pt will self-mointor and correct functional errors during problem soving tasks with mod A verbal cues.   Skilled Therapeutic Interventions: Skilled treatment session focused on cognition goals. SLP facilitated session by providing Mod A cues to recall specified words within word lists to achieve ~ 50% accuracy. With Mod A cues by able to demonstrate selective attention for ~ 30 minute intervals. Pt was aware of decreased memory but unable to implement compensatory memory strategies. Pt was left upright in bed, daughter present and all needs within reach. Continue per current plan of care.      Function:    Cognition Comprehension Comprehension assist level: Follows basic conversation/direction with extra time/assistive device;Understands basic 90% of the time/cues < 10% of the time  Expression   Expression assist level: Expresses basic needs/ideas: With extra time/assistive device  Social Interaction Social Interaction assist level: Interacts appropriately 75 - 89% of the time - Needs redirection for appropriate language or to initiate interaction.  Problem Solving Problem solving assist level: Solves basic 50 - 74% of the time/requires cueing 25 - 49% of the time  Memory Memory assist  level: Recognizes or recalls 75 - 89% of the time/requires cueing 10 - 24% of the time    Pain Pain Assessment Pain Scale: 0-10 Pain Score: 8  Pain Type: Acute pain;Surgical pain Pain Location: Abdomen Pain Orientation: Mid Pain Descriptors / Indicators: Aching Pain Onset: On-going Pain Intervention(s): Repositioned;Distraction;Emotional support  Therapy/Group: Individual Therapy  Barbara [name] 10/13/2017, 4:15 PM

## 2017-10-13 NOTE — Progress Notes (Signed)
Occupational Therapy Note  Patient Details  Name: Barbara [name] MRN: [id number] Date of Birth: 17-Jul-1940  Today's Date: 10/13/2017 OT Individual Time: 1030-1100 OT Individual Time Calculation (min): 30 min   Pt denies pain Individual Therapy   Pt resting in bed upon arrival with breakfast tray on bedside table.  OT intervention with focus on continued BUE strengthening with 2# dumbbells and 2kg ball.  Pt performed 12 X 3 of chest presses, biceps curls, and straight arm raises.  Pt remained in bed with all needs within reach.  [name] [facility] 10/13/2017, 12:12 PM

## 2017-10-13 NOTE — Plan of Care (Signed)
  Problem: RH PAIN MANAGEMENT Goal: RH STG PAIN MANAGED AT OR BELOW PT'S PAIN GOAL Description Pain free or pain less than 3  Outcome: Progressing  Continue to administered prn pain regimen

## 2017-10-13 NOTE — Progress Notes (Signed)
Physical Therapy Session Note  Patient Details  Name: Barbara [name] MRN: [id number] Date of Birth: 12/23/1940  Today's Date: 10/13/2017 PT Individual Time: 1300-1410 PT Individual Time Calculation (min): 70 min   Short Term Goals: Week 1:  PT Short Term Goal 1 (Week 1): Pt will transfer with LRAD and supervision PT Short Term Goal 2 (Week 1): Pt will ambulate 100' with LRAD and min guard PT Short Term Goal 3 (Week 1): Pt will complete standardized balance assessment PT Short Term Goal 4 (Week 1): Pt will propel w/c 100' with BUEs for strengthening and cardiovascular endurance  Skilled Therapeutic Interventions/Progress Updates:    pt c/o 6/10 pain at rest, premedicated per RN and pt report.  Session focus on pain control and functional mobility.  Pt continues to require increased time to complete all mobility 2/2 pain.    Sit>supine using HOB elevated and bed rails with supervision.  PT assisted with threading/pulling up pants with min assist for sit<>stand from EOB.  Stand/pivot throughout session with min guard and RW.  Pt completes car transfer with min assist for pivot, and supervision to lift LEs into car.  Simulated low SUV height, but will need to confer with daughter for measurements as pt states it's a high seat.  W/C propulsion x200' back towards room with BUEs for strengthening and overall activity tolerance/cardiovascular endurance.  Pt requires 1 short rest break at about 150', slow propulsion rate throughout.  Pt requesting to use BSC at end of session.  Min assist for w/c>BSC>recliner transfer with daughter present and assisting pt.  Continent void.  PT checked off pt's dtr to assist with transfers.  Positioned pt to comfort in recliner with call bell in reach and needs met.   Therapy Documentation Precautions:  Precautions Precautions: Fall Precaution Comments: pt with wound VAC Restrictions Weight Bearing Restrictions: No   See Function Navigator for Current Functional  Status.   Therapy/Group: Individual Therapy  Michel Santee 10/13/2017, 2:11 PM

## 2017-10-13 NOTE — IPOC Note (Signed)
Overall Plan of Care [facility]) Patient Details Name: Barbara [name] MRN: [id number] DOB: 01/23/1941  Admitting Diagnosis: <principal problem not specified> debility  Hospital Problems: Active Problems:   Debility     Functional Problem List: Nursing    PT Balance, Endurance, Motor, Safety  OT Balance, Cognition, Endurance, Motor, Pain  SLP Cognition  TR         Basic ADL's: OT Grooming, Bathing, Dressing, Toileting     Advanced  ADL's: OT       Transfers: PT Bed Mobility, Bed to Chair, Car, Manufacturing systems engineer, Metallurgist: PT Stairs, Ambulation     Additional Impairments: OT None  SLP Social Cognition   Problem Solving, Memory, Attention, Awareness  TR      Anticipated Outcomes Item Anticipated Outcome  Self Feeding independent  Swallowing      Basic self-care  supervision overall  Toileting  supervision    Bathroom Transfers supervision  Bowel/Bladder     Transfers  mod I  Locomotion  supervision with LRAD  Communication     Cognition  Mod I  Pain     Safety/Judgment      Therapy Plan: PT Intensity: Minimum of 1-2 x/day ,45 to 90 minutes PT Frequency: 5 out of 7 days PT Duration Estimated Length of Stay: 12-14 days OT Intensity: Minimum of 1-2 x/day, 45 to 90 minutes OT Frequency: 5 out of 7 days OT Duration/Estimated Length of Stay: 12-14 days SLP Intensity: Minumum of 1-2 x/day, 30 to 90 minutes SLP Frequency: 3 to 5 out of 7 days SLP Duration/Estimated Length of Stay: 6/4    Team Interventions: Nursing Interventions    PT interventions Ambulation/gait training, Discharge planning, DME/adaptive equipment instruction, Functional mobility training, Pain management, Psychosocial support, Splinting/orthotics, Therapeutic Activities, UE/LE Strength taining/ROM, Wheelchair propulsion/positioning, UE/LE Coordination activities, Therapeutic Exercise, Stair training, Patient/family education, Neuromuscular re-education,  Training and development officer, Community reintegration  OT Interventions Training and development officer, Cognitive remediation/compensation, Discharge planning, DME/adaptive equipment instruction, Functional mobility training, Patient/family education, Pain management, Psychosocial support, Self Care/advanced ADL retraining, Therapeutic Activities, Therapeutic Exercise, UE/LE Strength taining/ROM  SLP Interventions Cognitive remediation/compensation, Cueing hierarchy, Functional tasks, Patient/family education  TR Interventions    SW/CM Interventions Discharge Planning, Psychosocial Support, Patient/Family Education   Barriers to Discharge MD  Medical stability  Nursing      PT Decreased caregiver support potential for new O2 at home  OT      SLP      SW       Team Discharge Planning: Destination: PT-Home ,OT- Home , SLP-Home Projected Follow-up: PT-Home health PT, OT-  Home health OT, SLP-Home Health SLP, Outpatient SLP, 24 hour supervision/assistance Projected Equipment Needs: PT-To be determined, OT- Tub/shower bench, 3 in 1 bedside comode, SLP-None recommended by SLP Equipment Details: PT- , OT-  Patient/family involved in discharge planning: PT- Patient, Family member/caregiver,  OT-Patient, Family member/caregiver, SLP-Patient, Family member/caregiver  MD ELOS: 12-14 days Medical Rehab Prognosis:  Excellent Assessment: The patient has been admitted for CIR therapies with the diagnosis of debility/encephalopathy. The team will be addressing functional mobility, strength, stamina, balance, safety, adaptive techniques and equipment, self-care, bowel and bladder mgt, patient and caregiver education, nutrition, ego support, community reentry, cognition. Goals have been set at supervision to modified independent across disciplines.  Nutrition has been a factor given her abdominal injuries.  Mood   may be playing a role as well   Meredith Staggers, MD, [facility]      See Team  Conference  Notes for weekly updates to the plan of care

## 2017-10-13 NOTE — Progress Notes (Signed)
Occupational Therapy Session Note  Patient Details  Name: Barbara [name] MRN: [id number] Date of Birth: 1941/05/18  Today's Date: 10/13/2017 OT Individual Time: 4585-9292 OT Individual Time Calculation (min): 40 min  and Today's Date: 10/13/2017 OT Missed Time: 20 Minutes Missed Time Reason: Pain;Patient fatigue   Short Term Goals: Week 1:  OT Short Term Goal 1 (Week 1): Pt will be able to tolerate standing for 2-3 minutes to be able to complete grooming in standing.  OT Short Term Goal 2 (Week 1): Pt will be able to don pants with steadying A. OT Short Term Goal 3 (Week 1): pt will be able to wash bottom with steadying A. OT Short Term Goal 4 (Week 1): Pt will be able to transfer on and off toilet with with RW with steadying A.  Skilled Therapeutic Interventions/Progress Updates:    Pt seated in recliner c/o pain as described below. Discussion with pt and family present re OT POC, progression of rehab, and pain management techniques. Min A provided during stand pivot transfer to [facility], with vc for UE placement required. Pt able to complete squatting level peri-hygiene with CGA from [facility]. Pad on sacrum changed to facilitate pressure relief/maintain skin integrity. Edu and demo provided to pt/family re pressure relief techniques sitting and in bed. Pt requested to return to bed d/t pain progressing. Once in bed, pt provided with and given edu re yellow theraband exercises to perform in room if feeling better later on.  Pt and daughter requesting to end therapy session early and allow pt to rest. Pt left in supine with pressure relief positioning and all needs met.    Therapy Documentation Precautions:  Precautions Precautions: Fall Precaution Comments: pt with wound VAC Restrictions Weight Bearing Restrictions: No General: General OT Amount of Missed Time: 20 Minutes Vital Signs: Therapy Vitals Temp: 99.2 F (37.3 C) Temp Source: Oral Pulse Rate: 60 Resp: 16 BP: (!) 124/43 Patient  Position (if appropriate): Sitting Oxygen Therapy SpO2: 98 % O2 Device: Room Air Pain: Pain Assessment Pain Scale: 0-10 Pain Score: 8  Pain Type: Acute pain;Surgical pain Pain Location: Abdomen Pain Orientation: Mid Pain Descriptors / Indicators: Aching Pain Onset: On-going Pain Intervention(s): Repositioned;Distraction;Emotional support ADL: ADL ADL Comments: refer to functional navigator  See Function Navigator for Current Functional Status.   Therapy/Group: Individual Therapy  Curtis Sites 10/13/2017, 4:00 PM

## 2017-10-13 NOTE — Progress Notes (Signed)
[facility] PHYSICAL MEDICINE & REHABILITATION     PROGRESS NOTE    Subjective/Complaints: Patient still not eating much.  Abdominal pain fluctuates.  Family was concerned regarding side effects of Megace and refused.  They preferred that she try Marinol.  ROS: Patient denies fever, rash, sore throat, blurred vision, nausea, vomiting, diarrhea, cough, shortness of breath or chest pain, joint or back pain, headache, or mood change.    Objective:  No results found. Recent Labs    10/11/17 0704  WBC 10.9*  HGB 9.0*  HCT 28.1*  PLT 443*   Recent Labs    10/11/17 0704  NA 135  K 4.0  CL 94*  GLUCOSE 257*  BUN 9  CREATININE 0.82  CALCIUM 7.9*   CBG (last 3)  Recent Labs    10/12/17 1631 10/12/17 2141 10/13/17 0641  GLUCAP 160* 205* 153*    Wt Readings from Last 3 Encounters:  10/13/17 67.6 kg (149 lb)  10/10/17 66.3 kg (146 lb 2.6 oz)  09/24/17 62.7 kg (138 lb 4.8 oz)     Intake/Output Summary (Last 24 hours) at 10/13/2017 0905 Last data filed at 10/13/2017 0853 Gross per 24 hour  Intake 660 ml  Output 1 ml  Net 659 ml    Vital Signs: Blood pressure (!) 132/37, pulse 62, temperature 98.6 F (37 C), temperature source Oral, resp. rate 18, height 5\' 4"  (1.626 m), weight 67.6 kg (149 lb), SpO2 96 %. Physical Exam:  Constitutional: No distress . Vital signs reviewed. HEENT: EOMI, oral membranes moist Neck: supple Cardiovascular: RRR without murmur. No JVD    Respiratory: CTA Bilaterally without wheezes or rales. Normal effort    GI: Remains tender.  Vac in place.  Ostomy sealed.  Bowel sounds positive  Musculoskeletal:  No edema or tenderness in extremities  Neurological: She is alert.  Oriented 3 Motor: 4/5 in all 4, less proximally Skin: Skin is warm and dry.  Psychiatric: Affect flat    Assessment/Plan: 1. Debility after multiple medical which require 3+ hours per day of interdisciplinary therapy in a comprehensive inpatient rehab  setting. Physiatrist is providing close team supervision and 24 hour management of active medical problems listed below. Physiatrist and rehab team continue to assess barriers to discharge/monitor patient progress toward functional and medical goals.  Function:  Bathing Bathing position   Position: Sitting EOB  Bathing parts Body parts bathed by patient: Right arm, Left arm, Chest, Abdomen, Front perineal area, Right upper leg, Left upper leg, Right lower leg, Left lower leg Body parts bathed by helper: Buttocks, Back  Bathing assist Assist Level: Touching or steadying assistance(Pt > 75%)      Upper Body Dressing/Undressing Upper body dressing   What is the patient wearing?: Hospital gown     Pull over shirt/dress - Perfomed by patient: Thread/unthread right sleeve, Thread/unthread left sleeve, Put head through opening, Pull shirt over trunk          Upper body assist Assist Level: Touching or steadying assistance(Pt > 75%)      Lower Body Dressing/Undressing Lower body dressing   What is the patient wearing?: Shoes Underwear - Performed by patient: Pull underwear up/down Underwear - Performed by helper: Thread/unthread right underwear leg, Thread/unthread left underwear leg Pants- Performed by patient: Pull pants up/down Pants- Performed by helper: Thread/unthread left pants leg, Thread/unthread right pants leg         Shoes - Performed by patient: Don/doff right shoe, Don/doff left shoe  Lower body assist Assist for lower body dressing: Touching or steadying assistance (Pt > 75%)      Toileting Toileting   Toileting steps completed by patient: Performs perineal hygiene Toileting steps completed by helper: Adjust clothing prior to toileting, Adjust clothing after toileting    Toileting assist Assist level: Touching or steadying assistance (Pt.75%)   Transfers Chair/bed transfer   Chair/bed transfer method: Stand pivot Chair/bed transfer assist  level: Touching or steadying assistance (Pt > 75%) Chair/bed transfer assistive device: Armrests, Medical sales representative     Max distance: 15' Assist level: Touching or steadying assistance (Pt > 75%)   Wheelchair          Cognition Comprehension Comprehension assist level: Follows basic conversation/direction with extra time/assistive device  Expression Expression assist level: Expresses basic needs/ideas: With no assist  Social Interaction Social Interaction assist level: Interacts appropriately 75 - 89% of the time - Needs redirection for appropriate language or to initiate interaction.  Problem Solving Problem solving assist level: Solves basic 50 - 74% of the time/requires cueing 25 - 49% of the time, Solves basic 25 - 49% of the time - needs direction more than half the time to initiate, plan or complete simple activities  Memory Memory assist level: Recognizes or recalls 50 - 74% of the time/requires cueing 25 - 49% of the time, Recognizes or recalls 25 - 49% of the time/requires cueing 50 - 75% of the time   Medical Problem List and Plan: 1.  Debility secondary to peritonitis/sepsis.  Status post left and sigmoid colectomy with colostomy  -continue therapies 2.  DVT Prophylaxis/Anticoagulation: Subcutaneous heparin.  Monitor for any bleeding episodes 3. Pain Management: Oxycodone and Robaxin as needed  -pain at times but improving. Prn meds effective. Need to pretreat before activities 4. Mood: Provide emotional support 5. Neuropsych: This patient is not fully capable of making decisions on her own behalf. 6. Skin/Wound Care: Routine skin checks 7. Fluids/Electrolytes/Nutrition:    -Fortunately weights are fairly stable at this point [facility] Weights   10/11/17 0427 10/12/17 0458 10/13/17 0539  Weight: 67 kg (147 lb 11.3 oz) 67.2 kg (148 lb 2.4 oz) 67.6 kg (149 lb)   -Again discussed intake and nutrition at length with patient and family.  I told the daughter that I  did not recommend starting Marinol given high incidence of neuro-cognitive side effects.  When I told her this, she said to go ahead and start the Megace.  However, patient then refused and said she did not want to be on anything "new".  I did suggest Remeron as an alternative but again patient refuses.  -appreciate RD follow up  -continue protein supp    -Push p.o. intake as much as possible  -Recheck electrolytes tomorrow as well as pre-albumin 8.  PAF.  Cardiac rate controlled.  Tambocor 100 mg twice daily  -Heart rate appears well controlled at present 9.  Diabetes mellitus.  Hemoglobin A1c 6.4.    -Lantus increased to 10 units at bedtime on 5/22  -Continue to monitor blood sugars  -continue SSI 10.  History of GI bleed.  Continue Protonix.  No bleeding episodes  -hgb 9.0   LOS (Days) 3 A FACE TO FACE EVALUATION WAS PERFORMED  Meredith Staggers, MD 10/13/2017 9:05 AM

## 2017-10-14 ENCOUNTER — Inpatient Hospital Stay (HOSPITAL_COMMUNITY): Payer: Medicare Other | Admitting: Physical Therapy

## 2017-10-14 ENCOUNTER — Inpatient Hospital Stay (HOSPITAL_COMMUNITY): Payer: Medicare Other | Admitting: Speech Pathology

## 2017-10-14 ENCOUNTER — Inpatient Hospital Stay (HOSPITAL_COMMUNITY): Payer: Medicare Other

## 2017-10-14 ENCOUNTER — Inpatient Hospital Stay (HOSPITAL_COMMUNITY): Payer: Medicare Other | Admitting: Occupational Therapy

## 2017-10-14 DIAGNOSIS — Z933 Colostomy status: Secondary | ICD-10-CM

## 2017-10-14 DIAGNOSIS — R630 Anorexia: Secondary | ICD-10-CM

## 2017-10-14 LAB — BASIC METABOLIC PANEL
ANION GAP: 8 (ref 5–15)
BUN: 7 mg/dL (ref 6–20)
CALCIUM: 8.3 mg/dL — AB (ref 8.9–10.3)
CO2: 34 mmol/L — AB (ref 22–32)
Chloride: 96 mmol/L — ABNORMAL LOW (ref 101–111)
Creatinine, Ser: 0.79 mg/dL (ref 0.44–1.00)
GLUCOSE: 97 mg/dL (ref 65–99)
Potassium: 3.9 mmol/L (ref 3.5–5.1)
Sodium: 138 mmol/L (ref 135–145)

## 2017-10-14 LAB — PREALBUMIN: Prealbumin: 5 mg/dL — ABNORMAL LOW (ref 18–38)

## 2017-10-14 LAB — GLUCOSE, CAPILLARY: Glucose-Capillary: 167 mg/dL — ABNORMAL HIGH (ref 65–99)

## 2017-10-14 NOTE — Plan of Care (Signed)
  Problem: RH SKIN INTEGRITY Goal: RH STG SKIN FREE OF INFECTION/BREAKDOWN Description Patient will be pain free or pain less than 3  Outcome: Progressing  No noted s/s of infection daily assessments and as needed  Problem: RH SKIN INTEGRITY Goal: RH STG ABLE TO PERFORM INCISION/WOUND CARE W/ASSISTANCE Description STG Able To Perform Incision/Wound Care With Mod Assistance.  Outcome: Progressing  Wound Vac continuous 125

## 2017-10-14 NOTE — Consult Note (Signed)
[facility] Nurse wound follow up Wound type: Surgical Measurement:Per Wednesday Wound WSF:KCLEX red, moist Drainage (amount, consistency, odor) small amount serosanguinous.  NPWT cannister changed today. Periwound: With periwound maceration, most severe at umbilicus and at distal end of wound. Areas of greatest severity protected with skin barrier ring prior to drape placement. Dressing procedure/placement/frequency: Continue NPWT dressing change three times weekly on M/W/F. Simultaneous ostomy pouch changes at this time.    [facility] Nurse ostomy follow up Stoma type/location: LUQ colostomy Stomal assessment/size: Slightly oval, pale pink and tacky. 1-inch from top to bottom, 1 and 1/8 inch from side to side. Minor mucocutaneous separation at 3 o'clock, area filled with stoma powder prior to placing skin barrier ring and pouching system. Peristomal assessment: As noted above, clear Treatment options for stomal/peristomal skin: As noted above Output: very small piece of formed brown stool at os today. Nothing in pouch. Ostomy pouching: 2pc. 2 and 3/4 inch pouching system. There is another in this size at bedside for Monday, but thereafter, suggest using 2 and 1/4 inch size pouching system. Education provided: Demonstration of pouch tail closure x3 today.  Patient observing, but not willing to participate today. Family member is sleeping until very end of visit and sits up to observe teaching, but does not participate. Enrolled patient in [facility] Discharge program: Previously.  WOC RN to see again on Monday, 5/27.  [facility] nursing team will follow for wound and ostomy needs, and will remain available to this patient, the nursing and medical teams.   Thanks, Maudie Flakes, MSN, RN, [place], Arther Abbott  Pager# [phone]

## 2017-10-14 NOTE — Progress Notes (Signed)
Physical Therapy Session Note  Patient Details  Name: Barbara [name] MRN: [id number] Date of Birth: 06-Aug-1940  Today's Date: 10/14/2017 PT Individual Time: 1500-1600 PT Individual Time Calculation (min): 60 min   Short Term Goals: Week 1:  PT Short Term Goal 1 (Week 1): Pt will transfer with LRAD and supervision PT Short Term Goal 2 (Week 1): Pt will ambulate 100' with LRAD and min guard PT Short Term Goal 3 (Week 1): Pt will complete standardized balance assessment PT Short Term Goal 4 (Week 1): Pt will propel w/c 100' with BUEs for strengthening and cardiovascular endurance  Skilled Therapeutic Interventions/Progress Updates:    c/o 4/10 pain at start of session, 8/10 by end of session and RN notified for pain medication.  Session focus on activity tolerance, functional mobility, and LE strength.  Pt transitioned to EOB with increased time and bed features with supervision.  Sit<>Stand throughout session with supervision and RW, min guard for stand/pivot with RW throughout session.  Pt's declines w/c propulsion for pt today, stating that yesterday w/c propulsion made pt's abdomen hurt.  Educated pt again on starting to do more activity every day to help with pain management and become accustomed to movement.  Gait training x30' with RW and min guard, verbal cues for walker position and upright posture.  Seated edge of mat pt completes BLE therex 2x10: LAQ, hip flexion, ball squeezes, and hip abd with level 2 theraband.  Planned to attempt pt to complete hip/abdominal stretch in supine.  Pt requires mod assist to lift LEs on mat with 3 pillows for elevated head.  Pt states she's unable to come to her back 2/2 pain.  Rested in side lying and then returned to sitting with min assist.  Returned to room in w/c at end of session.  Positioned back to bed as above.  Call bell in reach and needs met.    Therapy Documentation Precautions:  Precautions Precautions: Fall Precaution Comments: pt with  wound VAC Restrictions Weight Bearing Restrictions: No   See Function Navigator for Current Functional Status.   Therapy/Group: Individual Therapy  Michel Santee 10/14/2017, 5:12 PM

## 2017-10-14 NOTE — Progress Notes (Signed)
[facility] PHYSICAL MEDICINE & REHABILITATION     PROGRESS NOTE    Subjective/Complaints: Poor appetite persists  ROS: Patient denies CP/SOB, nausea, vomiting, diarrhea,    Objective:  No results found. No results for input(s): WBC, HGB, HCT, PLT in the last 72 hours. Recent Labs    10/14/17 0509  NA 138  K 3.9  CL 96*  GLUCOSE 97  BUN 7  CREATININE 0.79  CALCIUM 8.3*   CBG (last 3)  Recent Labs    10/13/17 0641 10/13/17 1147 10/13/17 1651  GLUCAP 153* 187* 167*    Wt Readings from Last 3 Encounters:  10/14/17 67.8 kg (149 lb 7.6 oz)  10/10/17 66.3 kg (146 lb 2.6 oz)  09/24/17 62.7 kg (138 lb 4.8 oz)     Intake/Output Summary (Last 24 hours) at 10/14/2017 1336 Last data filed at 10/13/2017 1700 Gross per 24 hour  Intake 240 ml  Output -  Net 240 ml    Vital Signs: Blood pressure (!) 121/49, pulse 60, temperature 98.4 F (36.9 C), temperature source Oral, resp. rate 18, height 5\' 4"  (1.626 m), weight 67.8 kg (149 lb 7.6 oz), SpO2 98 %. Physical Exam:  Constitutional: No distress . Vital signs reviewed. HEENT: EOMI, oral membranes moist Neck: supple Cardiovascular: RRR without murmur. No JVD    Respiratory: CTA Bilaterally without wheezes or rales. Normal effort    GI: Remains tender.  Vac in place.  Ostomy sealed.  Bowel sounds positive  Musculoskeletal:  No edema or tenderness in extremities  Neurological: She is alert.  Oriented 3 Motor: 4/5 in all 4, less proximally Skin: Skin is warm and dry.  Psychiatric: Affect flat    Assessment/Plan: 1. Debility after multiple medical which require 3+ hours per day of interdisciplinary therapy in a comprehensive inpatient rehab setting. Physiatrist is providing close team supervision and 24 hour management of active medical problems listed below. Physiatrist and rehab team continue to assess barriers to discharge/monitor patient progress toward functional and medical  goals.  Function:  Bathing Bathing position   Position: Sitting EOB  Bathing parts Body parts bathed by patient: Right arm, Left arm, Chest, Abdomen, Front perineal area, Right upper leg, Left upper leg, Right lower leg, Left lower leg Body parts bathed by helper: Buttocks, Back  Bathing assist Assist Level: Touching or steadying assistance(Pt > 75%)      Upper Body Dressing/Undressing Upper body dressing   What is the patient wearing?: Pull over shirt/dress     Pull over shirt/dress - Perfomed by patient: Thread/unthread right sleeve, Thread/unthread left sleeve, Put head through opening, Pull shirt over trunk          Upper body assist Assist Level: Set up      Lower Body Dressing/Undressing Lower body dressing   What is the patient wearing?: Underwear, Pants, Ted Hose, Non-skid slipper socks Underwear - Performed by patient: Thread/unthread right underwear leg, Thread/unthread left underwear leg, Pull underwear up/down Underwear - Performed by helper: Thread/unthread right underwear leg, Thread/unthread left underwear leg Pants- Performed by patient: Pull pants up/down, Thread/unthread right pants leg, Thread/unthread left pants leg Pants- Performed by helper: Thread/unthread left pants leg, Thread/unthread right pants leg   Non-skid slipper socks- Performed by helper: Don/doff right sock, Don/doff left sock     Shoes - Performed by patient: Don/doff right shoe, Don/doff left shoe         TED Hose - Performed by helper: Don/doff right TED hose, Don/doff left TED hose  Lower body  assist Assist for lower body dressing: Touching or steadying assistance (Pt > 75%)      Toileting Toileting   Toileting steps completed by patient: Performs perineal hygiene Toileting steps completed by helper: Adjust clothing prior to toileting, Adjust clothing after toileting    Toileting assist Assist level: Touching or steadying assistance (Pt.75%)   Transfers Chair/bed transfer    Chair/bed transfer method: Ambulatory, Stand pivot Chair/bed transfer assist level: Touching or steadying assistance (Pt > 75%) Chair/bed transfer assistive device: Walker, Air cabin crew     Max distance: 15' Assist level: Touching or steadying assistance (Pt > 75%)   Wheelchair       Assist Level: Dependent (Pt equals 0%)(fatigue, not yet self propelling wheelchair)  Cognition Comprehension Comprehension assist level: Follows basic conversation/direction with extra time/assistive device, Understands basic 90% of the time/cues < 10% of the time  Expression Expression assist level: Expresses basic needs/ideas: With extra time/assistive device  Social Interaction Social Interaction assist level: Interacts appropriately 75 - 89% of the time - Needs redirection for appropriate language or to initiate interaction.  Problem Solving Problem solving assist level: Solves basic 50 - 74% of the time/requires cueing 25 - 49% of the time  Memory Memory assist level: Recognizes or recalls 75 - 89% of the time/requires cueing 10 - 24% of the time   Medical Problem List and Plan: 1.  Debility secondary to peritonitis/sepsis.  Status post left and sigmoid colectomy with colostomy  -continue therapies CIR PT, OT 2.  DVT Prophylaxis/Anticoagulation: Subcutaneous heparin.  Monitor for any bleeding episodes 3. Pain Management: Oxycodone and Robaxin as needed  -pain at times but improving. Prn meds effective. Need to pretreat before activities 4. Mood: Provide emotional support 5. Neuropsych: This patient is not fully capable of making decisions on her own behalf. 6. Skin/Wound Care: Routine skin checks 7. Fluids/Electrolytes/Nutrition:    -Fortunately weights are fairly stable at this point [facility] Weights   10/12/17 0458 10/13/17 0539 10/14/17 0519  Weight: 67.2 kg (148 lb 2.4 oz) 67.6 kg (149 lb) 67.8 kg (149 lb 7.6 oz)   - pt states she'll drink nutritional  supplements  electrolytes normal, await pre-albumin 8.  PAF.  Cardiac rate controlled.  Tambocor 100 mg twice daily  -Heart rate appears well controlled at present 9.  Diabetes mellitus.  Hemoglobin A1c 6.4.    -Lantus increased to 10 units at bedtime on 5/22  -Continue to monitor blood sugars  -continue SSI Good in hospital control 10.  History of GI bleed.  Continue Protonix.  No bleeding episodes  -hgb 9.0   LOS (Days) 4 A FACE TO FACE EVALUATION WAS PERFORMED  Charlett Blake, MD 10/14/2017 1:36 PM

## 2017-10-14 NOTE — Progress Notes (Signed)
Occupational Therapy Session Note  Patient Details  Name: Barbara [name] MRN: [id number] Date of Birth: [birth year]  Today's Date: 10/14/2017 OT Individual Time: 8472-0721 OT Individual Time Calculation (min): 45 min    Short Term Goals: Week 1:  OT Short Term Goal 1 (Week 1): Pt will be able to tolerate standing for 2-3 minutes to be able to complete grooming in standing.  OT Short Term Goal 2 (Week 1): Pt will be able to don pants with steadying A. OT Short Term Goal 3 (Week 1): pt will be able to wash bottom with steadying A. OT Short Term Goal 4 (Week 1): Pt will be able to transfer on and off toilet with with RW with steadying A.  Skilled Therapeutic Interventions/Progress Updates:    Pt seen for ADL training focusing on activity tolerance and balance. Pt sat up to EOB with min A to support back.  Pt doffed her O2 for 5-8 min to wash face, UB and don shirt.  On room air, O2 sats only 83% with activity.  O2 placed on pt at 2L and it resumed to 95% within 2 minutes.  Pt worked on [employer] self care with steadying A in standing and close S as she was reaching forward to her feet. Pt continues to have difficulty reaching to wash bottom due to decreased dynamic standing and ability to rotate torso.  Pt completed grooming from EOB and then was able to lay down with S.   Pt's bed alarm set and pt in room with all needs met.  Therapy Documentation Precautions:  Precautions Precautions: Fall Precaution Comments: pt with wound VAC Restrictions Weight Bearing Restrictions: No  Pain: Pain Assessment Pain Scale: 0-10 Pain Score: 4  Faces Pain Scale: No hurt Pain Type: Acute pain Pain Location: Generalized Pain Orientation: Right;Left;Upper;Lower Pain Descriptors / Indicators: Aching;Constant Pain Frequency: Constant Pain Onset: With Activity Patients Stated Pain Goal: 2 Pain Intervention(s): Medication (See eMAR) ADL: ADL ADL Comments: refer to functional navigator  See Function Navigator  for Current Functional Status.   Therapy/Group: Individual Therapy  [place] 10/14/2017, 10:23 AM

## 2017-10-14 NOTE — Progress Notes (Signed)
Occupational Therapy Note  Patient Details  Name: Barbara [name] MRN: [id number] Date of Birth: [birth year]  Today's Date: 10/14/2017 OT Individual Time: 1300-1340 OT Individual Time Calculation (min): 40 min   Pt c/o abdominal discomfort after ostomy bag change earlier in day; RN aware Individual Therapy  Pt resting in bed upon arrival with daughter present.  OT intervention with initial focus on bed mobility and sitting balance.  Pt able to perform supine>sit EOB with min A and maintain sitting balance EOB with steady A fading to supervision.  Pt engaged in BUE therex seated EOB with support provided at back.  Pt returned to supine with min A and remained in bed with all needs within reach.    [name] [facility] 10/14/2017, 2:50 PM

## 2017-10-14 NOTE — Progress Notes (Signed)
Speech Language Pathology Daily Session Note  Patient Details  Name: Barbara [name] MRN: [id number] Date of Birth: 20-May-1941  Today's Date: 10/14/2017 SLP Individual Time: 1100-1200 SLP Individual Time Calculation (min): 60 min  Short Term Goals: Week 1: SLP Short Term Goal 1 (Week 1): Pt will demonstrate sustained attention to functional tasks for 20 minutes with min A verbal cues for redirection. SLP Short Term Goal 2 (Week 1): Pt utilize external memory aids to recall new, daily information with min A verbal and question cues.  SLP Short Term Goal 3 (Week 1): Pt will demonstrate funtional problem solving for basic and familiar (mildly complex) tasks with min A verbal cues. SLP Short Term Goal 4 (Week 1): Pt will self-mointor and correct functional errors during problem soving tasks with mod A verbal cues.   Skilled Therapeutic Interventions: Skilled treatment session focused on cognition goals. SLP facilitated session by providing supervision cues to recall new daily information. Pt with schedule present in hand and able to use without reminders. Pt able to discuss issues with ostomy (wound dressing just changed prior to session) with clear sequence and accuracy. Pt was left upright in bed with daughter present.      Function:    Cognition Comprehension Comprehension assist level: Follows basic conversation/direction with extra time/assistive device;Understands basic 90% of the time/cues < 10% of the time  Expression   Expression assist level: Expresses basic needs/ideas: With extra time/assistive device  Social Interaction Social Interaction assist level: Interacts appropriately 75 - 89% of the time - Needs redirection for appropriate language or to initiate interaction.  Problem Solving Problem solving assist level: Solves basic 50 - 74% of the time/requires cueing 25 - 49% of the time  Memory Memory assist level: Recognizes or recalls 75 - 89% of the time/requires cueing 10 - 24% of  the time    Pain Pain Assessment Pain Score: 0-No pain Faces Pain Scale: No hurt  Therapy/Group: Individual Therapy  Seraiah Nowack 10/14/2017, 2:38 PM

## 2017-10-15 ENCOUNTER — Inpatient Hospital Stay (HOSPITAL_COMMUNITY): Payer: Medicare Other

## 2017-10-15 MED ORDER — PANTOPRAZOLE SODIUM 40 MG PO TBEC
40.0000 mg | DELAYED_RELEASE_TABLET | Freq: Two times a day (BID) | ORAL | Status: DC
  Administered 2017-10-15 – 2017-10-20 (×10): 40 mg via ORAL
  Filled 2017-10-15 (×10): qty 1

## 2017-10-15 NOTE — Progress Notes (Signed)
Occupational Therapy Session Note  Patient Details  Name: Barbara [name] MRN: [id number] Date of Birth: [birth year]  Today's Date: 10/15/2017 OT Individual Time: 0100-7121 OT Individual Time Calculation (min): 60 min    Short Term Goals: Week 1:  OT Short Term Goal 1 (Week 1): Pt will be able to tolerate standing for 2-3 minutes to be able to complete grooming in standing.  OT Short Term Goal 2 (Week 1): Pt will be able to don pants with steadying A. OT Short Term Goal 3 (Week 1): pt will be able to wash bottom with steadying A. OT Short Term Goal 4 (Week 1): Pt will be able to transfer on and off toilet with with RW with steadying A.  Skilled Therapeutic Interventions/Progress Updates:    Pt supine in bed agreeable to therapy with initial c/o pain as 8/10 in abdomen. Session focused on activity tolerance, ostomy care, and pain management techniques. Pt transitioned to EOB with vc provided for log rolling technique to reduce pain in abdomen. Pt completed UB bathing/dressing sitting EOB with set up and good sitting balance. Pt able to thread underwear sitting EOB using figure 4 technique with (S). Skilled monitoring of vitals throughout session, with trials removing [place]. Pt sat EOB for 30 min on RA with SpO2 remaining above 94% the entire time! Pt also reporting decrease in pain during b/d EOB to a 6/10. Pt completed sit to stand transfer with CGA and completing standing level clothing management with CGA. CGA provided during stand pivot transfer to [facility]. Pt returned to bed and was provided with extensive edu, demo, and practice re ostomy care. Pt was engaged and asked several questions throughout. Pt was left supine with bed alarm set and all needs met.   Therapy Documentation Precautions:  Precautions Precautions: Fall Precaution Comments: pt with wound VAC Restrictions Weight Bearing Restrictions: No   Pain: Pain Assessment Pain Scale: 0-10 Pain Score: 3  Pain Type: Acute pain Pain  Location: Abdomen Pain Orientation: Mid;Lower Pain Descriptors / Indicators: Aching Pain Frequency: Constant Pain Onset: On-going Patients Stated Pain Goal: 2 Pain Intervention(s): Refused ADL: ADL ADL Comments: refer to functional navigator See Function Navigator for Current Functional Status.   Therapy/Group: Individual Therapy  Curtis Sites 10/15/2017, 10:38 AM

## 2017-10-15 NOTE — Progress Notes (Signed)
[place] PHYSICAL MEDICINE & REHABILITATION     PROGRESS NOTE    Subjective/Complaints: No abd pain but c/o tenderness  ROS: Patient denies CP/SOB, nausea, vomiting, diarrhea,    Objective:  No results found. No results for input(s): WBC, HGB, HCT, PLT in the last 72 hours. Recent Labs    10/14/17 0509  NA 138  K 3.9  CL 96*  GLUCOSE 97  BUN 7  CREATININE 0.79  CALCIUM 8.3*   CBG (last 3)  Recent Labs    10/13/17 0641 10/13/17 1147 10/13/17 1651  GLUCAP 153* 187* 167*    Wt Readings from Last 3 Encounters:  10/15/17 63.2 kg (139 lb 5.3 oz)  10/10/17 66.3 kg (146 lb 2.6 oz)  09/24/17 62.7 kg (138 lb 4.8 oz)     Intake/Output Summary (Last 24 hours) at 10/15/2017 1323 Last data filed at 10/14/2017 1800 Gross per 24 hour  Intake 200 ml  Output -  Net 200 ml    Vital Signs: Blood pressure (!) 139/47, pulse 63, temperature 98.6 F (37 C), temperature source Oral, resp. rate 16, height 5\' 4"  (1.626 m), weight 63.2 kg (139 lb 5.3 oz), SpO2 96 %. Physical Exam:  Constitutional: No distress . Vital signs reviewed. HEENT: EOMI, oral membranes moist Neck: supple Cardiovascular: RRR without murmur. No JVD    Respiratory: CTA Bilaterally without wheezes or rales. Normal effort    GI: Remains tender.  Vac in place.  Ostomy sealed.  Bowel sounds positive Epigastric tenderness  Musculoskeletal:  No edema or tenderness in extremities  Neurological: She is alert.  Oriented 3 Motor: 4/5 in all 4, less proximally Skin: Skin is warm and dry.  Psychiatric: Affect flat    Assessment/Plan: 1. Debility after multiple medical which require 3+ hours per day of interdisciplinary therapy in a comprehensive inpatient rehab setting. Physiatrist is providing close team supervision and 24 hour management of active medical problems listed below. Physiatrist and rehab team continue to assess barriers to discharge/monitor patient progress toward functional and medical  goals.  Function:  Bathing Bathing position   Position: Sitting EOB  Bathing parts Body parts bathed by patient: Right arm, Left arm, Chest, Abdomen, Front perineal area, Right upper leg, Left upper leg, Right lower leg, Left lower leg, Buttocks Body parts bathed by helper: Back  Bathing assist Assist Level: Supervision or verbal cues      Upper Body Dressing/Undressing Upper body dressing   What is the patient wearing?: Pull over shirt/dress     Pull over shirt/dress - Perfomed by patient: Thread/unthread right sleeve, Thread/unthread left sleeve, Put head through opening, Pull shirt over trunk          Upper body assist Assist Level: Set up   Set up : To obtain clothing/put away  Lower Body Dressing/Undressing Lower body dressing   What is the patient wearing?: Underwear Underwear - Performed by patient: Thread/unthread right underwear leg, Thread/unthread left underwear leg, Pull underwear up/down Underwear - Performed by helper: Thread/unthread right underwear leg, Thread/unthread left underwear leg Pants- Performed by patient: Pull pants up/down, Thread/unthread right pants leg, Thread/unthread left pants leg Pants- Performed by helper: Thread/unthread left pants leg, Thread/unthread right pants leg   Non-skid slipper socks- Performed by helper: Don/doff right sock, Don/doff left sock     Shoes - Performed by patient: Don/doff right shoe, Don/doff left shoe         TED Hose - Performed by helper: Don/doff right TED hose, Don/doff left TED hose  Lower body assist Assist for lower body dressing: Supervision or verbal cues      Toileting Toileting   Toileting steps completed by patient: Performs perineal hygiene, Adjust clothing prior to toileting, Adjust clothing after toileting Toileting steps completed by helper: Adjust clothing prior to toileting, Adjust clothing after toileting    Toileting assist Assist level: Supervision or verbal cues    Transfers Chair/bed transfer   Chair/bed transfer method: Ambulatory, Stand pivot Chair/bed transfer assist level: Supervision or verbal cues Chair/bed transfer assistive device: Walker, Armrests     Locomotion Ambulation     Max distance: 15' Assist level: Touching or steadying assistance (Pt > 75%)   Wheelchair       Assist Level: Dependent (Pt equals 0%)(fatigue, not yet self propelling wheelchair)  Cognition Comprehension Comprehension assist level: Follows basic conversation/direction with extra time/assistive device  Expression Expression assist level: Expresses basic needs/ideas: With extra time/assistive device  Social Interaction Social Interaction assist level: Interacts appropriately 75 - 89% of the time - Needs redirection for appropriate language or to initiate interaction.  Problem Solving Problem solving assist level: Solves basic 50 - 74% of the time/requires cueing 25 - 49% of the time  Memory Memory assist level: Recognizes or recalls 50 - 74% of the time/requires cueing 25 - 49% of the time   Medical Problem List and Plan: 1.  Debility secondary to peritonitis/sepsis.  Status post left and sigmoid colectomy with colostomy  -continue therapies CIR PT, OT 2.  DVT Prophylaxis/Anticoagulation: Subcutaneous heparin.  Monitor for any bleeding episodes 3. Pain Management: Oxycodone and Robaxin as needed  -pain at times but improving. Prn meds effective. Need to pretreat before activities 4. Mood: Provide emotional support 5. Neuropsych: This patient is not fully capable of making decisions on her own behalf. 6. Skin/Wound Care: Routine skin checks 7. Fluids/Electrolytes/Nutrition:    -Fortunately weights are fairly stable at this point [facility] Weights   10/13/17 0539 10/14/17 0519 10/15/17 0414  Weight: 67.6 kg (149 lb) 67.8 kg (149 lb 7.6 oz) 63.2 kg (139 lb 5.3 oz)   - pt states she'll drink nutritional supplements  Low  pre-albumin- will ask dietary to eval 8.   PAF.  Cardiac rate controlled.  Tambocor 100 mg twice daily  -Heart rate appears well controlled at present 9.  Diabetes mellitus.  Hemoglobin A1c 6.4.    -Lantus increased to 10 units at bedtime on 5/22  -Continue to monitor blood sugars  -continue SSI  Good in hospital control 10.  History of GI bleed. Epigastric tenderness increase Protonix to BID.  No bleeding episodes  -hgb 9.0   LOS (Days) 5 A FACE TO FACE EVALUATION WAS PERFORMED  Charlett Blake, MD 10/15/2017 1:23 PM

## 2017-10-15 NOTE — Progress Notes (Signed)
CBG 185 

## 2017-10-15 NOTE — Progress Notes (Signed)
CBG 201@ 2232

## 2017-10-15 NOTE — Progress Notes (Signed)
Pt c/o "stomach hurts."  Hypoactive bowel sounds and wound vac continuous 125. Encourage pt to use pillow to give abdomen support when changing positions and cough.

## 2017-10-16 ENCOUNTER — Inpatient Hospital Stay (HOSPITAL_COMMUNITY): Payer: Medicare Other

## 2017-10-16 NOTE — Progress Notes (Signed)
Occupational Therapy Session Note  Patient Details  Name: Barbara [name] MRN: [id number] Date of Birth: Apr 18, 1941  Today's Date: 10/16/2017 OT Individual Time: 2174-7159 OT Individual Time Calculation (min): 71 min    Short Term Goals: Week 1:  OT Short Term Goal 1 (Week 1): Pt will be able to tolerate standing for 2-3 minutes to be able to complete grooming in standing.  OT Short Term Goal 2 (Week 1): Pt will be able to don pants with steadying A. OT Short Term Goal 3 (Week 1): pt will be able to wash bottom with steadying A. OT Short Term Goal 4 (Week 1): Pt will be able to transfer on and off toilet with with RW with steadying A.  Skilled Therapeutic Interventions/Progress Updates:    1:1. Pt stating pain in stomach is "there" but not rating. RN aware and already administered medication. Pt completes all tx on RA with O2 greater than 93%. Pt completes stand pivot transfers wihtout RW with min A and VC for hand placement and A to manage wound vac. Pt sits EOM to complete ball tosses (chest pass, bounce pass and overhead pass) with 1# wrist weights for BUe strengthening and endurance required for BADLs. In ADL apartment pt educated on energy conservation techniques while cooking and to increase safety. Pt asking about showering and bathing equipment for home once healed. OT demo TTB transfer and pt able to return demo with steadying A. Pt completes wii bowling activity 3 frames seated and 3 frames standing with supervision for balance and min cuing for use of controller. Exited session with pt seated in w/c, call light in reach and all needs met  Therapy Documentation Precautions:  Precautions Precautions: Fall Precaution Comments: pt with wound VAC Restrictions Weight Bearing Restrictions: No General:   Vital Signs:   See Function Navigator for Current Functional Status.   Therapy/Group: Individual Therapy  Tonny Branch 10/16/2017, 3:30 PM

## 2017-10-16 NOTE — Progress Notes (Signed)
Occupational Therapy Session Note  Patient Details  Name: HATSUE SIME MRN: [id number] Date of Birth: Feb 21, 1941  Today's Date: 10/16/2017 OT Individual Time: 0700-0755 OT Individual Time Calculation (min): 55 min    Short Term Goals: Week 1:  OT Short Term Goal 1 (Week 1): Pt will be able to tolerate standing for 2-3 minutes to be able to complete grooming in standing.  OT Short Term Goal 2 (Week 1): Pt will be able to don pants with steadying A. OT Short Term Goal 3 (Week 1): pt will be able to wash bottom with steadying A. OT Short Term Goal 4 (Week 1): Pt will be able to transfer on and off toilet with with RW with steadying A.  Skilled Therapeutic Interventions/Progress Updates:    OT intervention with focus on self feeding, bed mobility, and BUE therex to increase activity tolerance and independence with BADLs.  Pt requires min A for bed mobility and supervision for sitting balance at EOB.  Pt required assistance with setup for breakfast.  Educated pt on importance of adequate PO intake to speed healing/recovery process.  Pt engaged in BUE therex with 2kg ball (chest presses, shoulder presses, and curls-12X3). Pt remained in bed with all needs within reach.   Therapy Documentation Precautions:  Precautions Precautions: Fall Precaution Comments: pt with wound VAC Restrictions Weight Bearing Restrictions: No   Pain:  Pt c/o abdominal pain (unrated); RN aware  See Function Navigator for Current Functional Status.   Therapy/Group: Individual Therapy  Leroy Libman 10/16/2017, 7:57 AM

## 2017-10-16 NOTE — Progress Notes (Signed)
Physical Therapy Session Note  Patient Details  Name: Barbara [name] MRN: [id number] Date of Birth: Mar 01, 1941  Today's Date: 10/16/2017 PT Individual Time: 1115-1158 PT Individual Time Calculation (min): 43 min   Short Term Goals: Week 1:  PT Short Term Goal 1 (Week 1): Pt will transfer with LRAD and supervision PT Short Term Goal 2 (Week 1): Pt will ambulate 100' with LRAD and min guard PT Short Term Goal 3 (Week 1): Pt will complete standardized balance assessment PT Short Term Goal 4 (Week 1): Pt will propel w/c 100' with BUEs for strengthening and cardiovascular endurance  Skilled Therapeutic Interventions/Progress Updates:    Pt supine in bed upon PT arrival, agreeable to therapy tx and reports pain in stomach 6/10. Pt transferred from supine>sitting with min assist and performed stand pivot with RW and min assist from bed>w/c. Pt transported from room>gym in w/c. Pt on RA this session, SpO2 >90% entire session. Pt performed sit<>stands throughout session min assist. Pt performed standing marches with RW 2 x 10, toes taps on 2 inch step 2 x 10 for standing balance and activity tolerance. Pt ambulated 2 x 30 ft with min assist and use of RW. Pt transported back to room and left seated with needs in reach, daughter present.   Therapy Documentation Precautions:  Precautions Precautions: Fall Precaution Comments: pt with wound VAC Restrictions Weight Bearing Restrictions: No   See Function Navigator for Current Functional Status.   Therapy/Group: Individual Therapy  Netta Corrigan, PT, DPT 10/16/2017, 11:43 AM

## 2017-10-16 NOTE — Progress Notes (Signed)
[place] PHYSICAL MEDICINE & REHABILITATION     PROGRESS NOTE    Subjective/Complaints: Patient complains of intermittent abdominal pain.  She points to the areas around her wound VAC as well as her colostomy.   ROS: Patient denies CP/SOB, nausea, vomiting, diarrhea,    Objective:  No results found. No results for input(s): WBC, HGB, HCT, PLT in the last 72 hours. Recent Labs    10/14/17 0509  NA 138  K 3.9  CL 96*  GLUCOSE 97  BUN 7  CREATININE 0.79  CALCIUM 8.3*   CBG (last 3)  Recent Labs    10/13/17 1651  GLUCAP 167*    Wt Readings from Last 3 Encounters:  10/16/17 63.1 kg (139 lb 1.8 oz)  10/10/17 66.3 kg (146 lb 2.6 oz)  09/24/17 62.7 kg (138 lb 4.8 oz)     Intake/Output Summary (Last 24 hours) at 10/16/2017 1159 Last data filed at 10/16/2017 0826 Gross per 24 hour  Intake 360 ml  Output 418 ml  Net -58 ml    Vital Signs: Blood pressure (!) 138/47, pulse 65, temperature 98 F (36.7 C), temperature source Oral, resp. rate 18, height 5\' 4"  (1.626 m), weight 63.1 kg (139 lb 1.8 oz), SpO2 90 %. Physical Exam:  Constitutional: No distress . Vital signs reviewed. HEENT: EOMI, oral membranes moist Neck: supple Cardiovascular: RRR without murmur. No JVD    Respiratory: CTA Bilaterally without wheezes or rales. Normal effort    GI: Remains tender.  Vac in place.  Ostomy sealed.  Bowel sounds positive Epigastric tenderness  Musculoskeletal:  No edema or tenderness in extremities  Neurological: She is alert.  Oriented 3 Motor: 4/5 in all 4, less proximally Skin: Skin is warm and dry.  Psychiatric: Affect flat    Assessment/Plan: 1. Debility after multiple medical which require 3+ hours per day of interdisciplinary therapy in a comprehensive inpatient rehab setting. Physiatrist is providing close team supervision and 24 hour management of active medical problems listed below. Physiatrist and rehab team continue to assess barriers to  discharge/monitor patient progress toward functional and medical goals.  Function:  Bathing Bathing position   Position: Sitting EOB  Bathing parts Body parts bathed by patient: Right arm, Left arm, Chest, Abdomen, Front perineal area, Right upper leg, Left upper leg, Right lower leg, Left lower leg, Buttocks Body parts bathed by helper: Back  Bathing assist Assist Level: Supervision or verbal cues      Upper Body Dressing/Undressing Upper body dressing   What is the patient wearing?: Pull over shirt/dress     Pull over shirt/dress - Perfomed by patient: Thread/unthread right sleeve, Thread/unthread left sleeve, Put head through opening, Pull shirt over trunk          Upper body assist Assist Level: Set up   Set up : To obtain clothing/put away  Lower Body Dressing/Undressing Lower body dressing   What is the patient wearing?: Underwear Underwear - Performed by patient: Thread/unthread right underwear leg, Thread/unthread left underwear leg, Pull underwear up/down Underwear - Performed by helper: Thread/unthread right underwear leg, Thread/unthread left underwear leg Pants- Performed by patient: Pull pants up/down, Thread/unthread right pants leg, Thread/unthread left pants leg Pants- Performed by helper: Thread/unthread left pants leg, Thread/unthread right pants leg   Non-skid slipper socks- Performed by helper: Don/doff right sock, Don/doff left sock     Shoes - Performed by patient: Don/doff right shoe, Don/doff left shoe         TED Hose - Performed  by helper: Don/doff right TED hose, Don/doff left TED hose  Lower body assist Assist for lower body dressing: Supervision or verbal cues      Toileting Toileting   Toileting steps completed by patient: Performs perineal hygiene, Adjust clothing prior to toileting, Adjust clothing after toileting Toileting steps completed by helper: Adjust clothing prior to toileting, Adjust clothing after toileting    Toileting  assist Assist level: Supervision or verbal cues   Transfers Chair/bed transfer   Chair/bed transfer method: Ambulatory, Stand pivot Chair/bed transfer assist level: Supervision or verbal cues Chair/bed transfer assistive device: Walker, Armrests     Locomotion Ambulation     Max distance: 15' Assist level: Touching or steadying assistance (Pt > 75%)   Wheelchair       Assist Level: Dependent (Pt equals 0%)(fatigue, not yet self propelling wheelchair)  Cognition Comprehension Comprehension assist level: Follows basic conversation/direction with extra time/assistive device  Expression Expression assist level: Expresses basic needs/ideas: With extra time/assistive device  Social Interaction Social Interaction assist level: Interacts appropriately with others with medication or extra time (anti-anxiety, antidepressant).  Problem Solving Problem solving assist level: Solves basic problems with no assist  Memory Memory assist level: Recognizes or recalls 50 - 74% of the time/requires cueing 25 - 49% of the time   Medical Problem List and Plan: 1.  Debility secondary to peritonitis/sepsis.  Status post left and sigmoid colectomy with colostomy  -continue therapies CIR PT, OT, therapy notes reviewed 2.  DVT Prophylaxis/Anticoagulation: Subcutaneous heparin.  Monitor for any bleeding episodes 3. Pain Management: Oxycodone and Robaxin as needed  -pain at times but improving. Prn meds effective. Need to pretreat before activities 4. Mood: Provide emotional support 5. Neuropsych: This patient is not fully capable of making decisions on her own behalf. 6. Skin/Wound Care: Routine skin checks 7. Fluids/Electrolytes/Nutrition:    -Fortunately weights are fairly stable at this point [facility] Weights   10/14/17 0519 10/15/17 0414 10/16/17 0547  Weight: 67.8 kg (149 lb 7.6 oz) 63.2 kg (139 lb 5.3 oz) 63.1 kg (139 lb 1.8 oz)   - pt states she'll drink nutritional supplements  Low   pre-albumin-dietary consult pending 8.  PAF.  Cardiac rate controlled.  Tambocor 100 mg twice daily  -Heart rate appears well controlled at present 9.  Diabetes mellitus.  Hemoglobin A1c 6.4.    -Lantus increased to 10 units at bedtime on 5/22  -Continue to monitor blood sugars  -continue SSI  CBG (last 3)  Recent Labs    10/13/17 1651  GLUCAP 167*  Order ac and HS CBGs 10.  History of GI bleed. Epigastric tenderness increase Protonix to BID.  No bleeding episodes  -hgb 9.0   LOS (Days) 6 A FACE TO FACE EVALUATION WAS PERFORMED  Charlett Blake, MD 10/16/2017 11:59 AM

## 2017-10-17 ENCOUNTER — Inpatient Hospital Stay (HOSPITAL_COMMUNITY): Payer: Medicare Other

## 2017-10-17 ENCOUNTER — Inpatient Hospital Stay (HOSPITAL_COMMUNITY): Payer: Medicare Other | Admitting: Occupational Therapy

## 2017-10-17 ENCOUNTER — Inpatient Hospital Stay (HOSPITAL_COMMUNITY): Payer: Medicare Other | Admitting: Physical Therapy

## 2017-10-17 NOTE — Progress Notes (Signed)
Occupational Therapy Note  Patient Details  Name: Barbara [name] MRN: [id number] Date of Birth: [birth year]  Today's Date: 10/17/2017 OT Individual Time: 1345-1425 OT Individual Time Calculation (min): 40 min   Pt denies pain this afternoon Individual Therapy  Pt resting in w/c upon arrival with daughter present.  OT intervention with focus on BUE therex for UE strengthening and increase independence with BADLs. Pt performed curls, chest presses, shoulder presses, and rowing (12X3) with 3# bar.  Pt also used 2# bar to hit beach ball back at therapist.  Pt commented that she "feels much stronger" since starting exercises.  Pt remained in w/c with all needs within reach and daughter present.    Barbara [name] [facility] 10/17/2017, 2:34 PM

## 2017-10-17 NOTE — Progress Notes (Addendum)
Physical Therapy Note  Patient Details  Name: Barbara [name] MRN: [id number] Date of Birth: 06-Feb-1941 Today's Date: 10/17/2017  1150-1210, 20 min individual tx Pain: none per pt  Make-up session.  Pt noted to have moderate edema bil feet/ankles.  Pt doffed slippers with extra time, and PT initiated donning TEDS and non slip socks with pt completing tasks. PT educated pt on edema reduction via ankle pumps and use of TEDs.    Therapeutic exercises performed with bil LEs to increase strength for functional mobility: In standing with RW, 10 x 1 heel raises/toe raises, and bil mini squats.  Seated 10 x 2 ankle pumps R/L, focusing on ankle DF rather than toe extension.  Heel cords noted to be tight, R>L.  PT performed grade 1 and 2 joint mobilizations at talo-crural joint to increase flexibility.   Pt performed a 2nd set of toe raises in standing with RW with increased AROM. O2 sats 97% on room air.   Pt left resting in w/c with all needs within reach, set up for lunch.  See function navigator for current status. [name] 10/17/2017, 12:21 PM

## 2017-10-17 NOTE — Care Management (Signed)
[facility] Individual Statement of Services  Patient Name:  Barbara [name]  Date:  10/17/2017  Welcome to the [facility].  Our goal is to provide you with an individualized program based on your diagnosis and situation, designed to meet your specific needs.  With this comprehensive rehabilitation program, you will be expected to participate in at least 3 hours of rehabilitation therapies Monday-Friday, with modified therapy programming on the weekends.  Your rehabilitation program will include the following services:  Physical Therapy (PT), Occupational Therapy (OT), Speech Therapy (ST), 24 hour per day rehabilitation nursing, Therapeutic Recreaction (TR), Neuropsychology, Case Management (Social Worker), Rehabilitation Medicine, Nutrition Services and Pharmacy Services  Weekly team conferences will be held on Tuesdays to discuss your progress.  Your Social Worker will talk with you frequently to get your input and to update you on team discussions.  Team conferences with you and your family in attendance may also be held.  Expected length of stay: 12-14 days    Overall anticipated outcome: supervision  Depending on your progress and recovery, your program may change. Your Social Worker will coordinate services and will keep you informed of any changes. Your Social Worker's name and contact numbers are listed  below.  The following services may also be recommended but are not provided by the [facility] will be made to provide these services after discharge if needed.  Arrangements include referral to agencies that provide these services.  Your insurance has been verified to be:  [employer] Your primary doctor is:  Willey Blade  Pertinent information will be shared with your doctor and your insurance  company.  Social Worker:  [place], [place] or ([phone]   Information discussed with and copy given to patient by: Lennart Pall, 10/14/2017, 10:44 AM

## 2017-10-17 NOTE — Progress Notes (Signed)
Speech Language Pathology Daily Session Note  Patient Details  Name: Barbara [name] MRN: [id number] Date of Birth: [birth year]  Today's Date: 10/17/2017 SLP Individual Time: 1000-1100 SLP Individual Time Calculation (min): 60 min  Short Term Goals: Week 1: SLP Short Term Goal 1 (Week 1): Pt will demonstrate sustained attention to functional tasks for 20 minutes with min A verbal cues for redirection. SLP Short Term Goal 2 (Week 1): Pt utilize external memory aids to recall new, daily information with min A verbal and question cues.  SLP Short Term Goal 3 (Week 1): Pt will demonstrate funtional problem solving for basic and familiar (mildly complex) tasks with min A verbal cues. SLP Short Term Goal 4 (Week 1): Pt will self-mointor and correct functional errors during problem soving tasks with mod A verbal cues.   Skilled Therapeutic Interventions:Skilled ST services focused on cognitive skills. SLP facilitated recall of novel information, semi-complex problem solving utilizing ALFA for money and daily math problems requiring min A verbal cues. Pt required supervision A verbal cues for error awareness and min A verbal cues to correct error utilizing pen and paper to compensate for working memory deficits. Pt demonstrated selective attention with supervision A verbal cue during 45 minute task. Pt demonstrated overall great improvement in cognitive ability. Pt was left in room with call bell within reach. Recommend to continue skilled ST services.      Function:  Eating Eating                 Cognition Comprehension Comprehension assist level: Follows basic conversation/direction with extra time/assistive device  Expression   Expression assist level: Expresses basic needs/ideas: With extra time/assistive device  Social Interaction Social Interaction assist level: Interacts appropriately with others with medication or extra time (anti-anxiety, antidepressant).  Problem Solving Problem  solving assist level: Solves basic problems with no assist  Memory Memory assist level: Recognizes or recalls 75 - 89% of the time/requires cueing 10 - 24% of the time    Pain Pain Assessment Pain Scale: 0-10 Pain Score: 0-No pain Pain Type: Surgical pain Pain Location: Abdomen Pain Frequency: Constant Pain Onset: On-going Patients Stated Pain Goal: 2 Pain Intervention(s): Medication (See eMAR)(Pt premedicated prior to drssing change by [place])  Therapy/Group: Individual Therapy  Aryannah Mohon  [facility] 10/17/2017, 1:39 PM

## 2017-10-17 NOTE — Progress Notes (Signed)
Occupational Therapy Session Note  Patient Details  Name: Barbara [name] MRN: [id number] Date of Birth: [birth year]  Today's Date: 10/17/2017 OT Individual Time: 0539-7673 OT Individual Time Calculation (min): 45 min    Short Term Goals: Week 1:  OT Short Term Goal 1 (Week 1): Pt will be able to tolerate standing for 2-3 minutes to be able to complete grooming in standing.  OT Short Term Goal 2 (Week 1): Pt will be able to don pants with steadying A. OT Short Term Goal 3 (Week 1): pt will be able to wash bottom with steadying A. OT Short Term Goal 4 (Week 1): Pt will be able to transfer on and off toilet with with RW with steadying A.  Skilled Therapeutic Interventions/Progress Updates:    Pt received in bed stating she had washed up last night and put on clean pajamas.   Pt on room air.  She did need to toilet. Sat to EOB independently and donned slippers without A.   Ambulated to toilet with RW with S.  She completed all toileting with S and then stood at sink for 8 min to complete grooming tasks.  Pt at 95% sat rate after activity.  Pt sat in w/c.  Discussed discharge needs of shower seat and BSC.  Pt and dtr plan to have her stay at her dtr's house for awhile.  Pt tolerated session well. Pt resting in w/c with all needs met.  Therapy Documentation Precautions:  Precautions Precautions: Fall Precaution Comments: pt with wound VAC Restrictions Weight Bearing Restrictions: No General:   Vital Signs:  Pain: Pain Assessment Pain Scale: 0-10 Pain Score: 0-No pain ADL: ADL ADL Comments: refer to functional navigator Vision   Perception    Praxis   Exercises:   Other Treatments:    See Function Navigator for Current Functional Status.   Therapy/Group: Individual Therapy  [place] 10/17/2017, 9:53 AM

## 2017-10-17 NOTE — Progress Notes (Signed)
Nutrition Follow-up  INTERVENTION:   Continue:  Premier Protein po TID, each supplement provides 160 kcal and 30 grams of protein.  Nourishment snacks  RD to follow up on calorie count results  NUTRITION DIAGNOSIS:   Inadequate oral intake related to poor appetite as evidenced by per patient/family report. Ongoing.   GOAL:   Patient will meet greater than or equal to 90% of their needs Progressing.   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 Presents with increasing abdominal pain as well as nausea vomiting and diarrhea. Due to sepsis with diffuse peritonitis, surgical intervention recommended and underwent exploratory laparotomy with left and sigmoid colectomy colostomy  Pt more alert today  5/25 consult received for calorie count will start today  Per record review meal completion: 5-25%  Medications reviewed and include: SSI, lantus  Labs reviewed: prealbumin 5 (5/24) CBG (last 3)  No results for input(s): GLUCAP in the last 72 hours.    Diet Order:   Diet Order           Diet Carb Modified Fluid consistency: Thin; Room service appropriate? Yes  Diet effective now          EDUCATION NEEDS:   Not appropriate for education at this time  Skin:  Skin Assessment: Skin Integrity Issues: Skin Integrity Issues:: Incisions, Wound VAC Wound Vac: abdomen Incisions: abdomen  Last BM:  5/26  Height:   Ht Readings from Last 1 Encounters:  10/10/17 5\' 4"  (1.626 m)    Weight:   Wt Readings from Last 1 Encounters:  10/17/17 132 lb 4.4 oz (60 kg)    Ideal Body Weight:  54.54 kg  BMI:  Body mass index is 22.71  kg/m.  Estimated Nutritional Needs:   Kcal:  1650-1850  Protein:  85-95 grams  Fluid:  1.6 - 1.8 L/day  Maylon Peppers RD, LDN, CNSC [phone] Pager [phone] After Hours Pager

## 2017-10-17 NOTE — Consult Note (Addendum)
[place] Nurse wound follow up Wound type: Surgical Measurement:Per Wednesday; refer to previous progress notes Wound UUE:KCMKL red, moist Drainage (amount, consistency, odor) small amount serosanguinous.  Periwound: previously noted periwound maceration has resolved.  Dressing procedure/placement/frequency: Continue NPWT dressing change three times weekly on M/W/F. [place] team will perform simultaneous ostomy pouch changes at this time related to close proximity.  Applied one piece of black foam to 151mm cont suction, pt tolerated with minimal amt discomfort. Applied barrier ring at the lower abd skin folds to attempt to maintain a seal. Pt will probably NOT need Vac upon discharge for abd wound; refer to surgical team if desired.    [place] Nurse ostomy follow up Stoma type/location: LUQ colostomy Stomal assessment/size: Slightly oval, pale pink and tacky. 1-inch from top to bottom, 1 and 1/8 inch from side to side, oval.  Some mucocutaneous separation at 3 o'clock, area filled with stoma powder prior to placing skin barrier ring and pouching system. Output: Small amt brown liquid stool. Ostomy pouching: 2pc. 2 1/4 inch pouching system and barrier ring applied.  Education provided: Pt watched procedure in the mirror and was able to open and close velcro to empty with assistance.  No family present; will attempt to have them attend a teaching session prior to discharge.  Enrolled patient in [facility] Discharge program: Previously. Supplies ordered to the bedside.  Reviewed pouching routines and patient asked appropriate questions.  [place] team will change again on Wed.  Encouraged her to practice emptying with therapists when in the bathroom.  Pt will need home health assistance after discharge. Julien Girt MSN, RN, [place], [place], [place]

## 2017-10-17 NOTE — Progress Notes (Signed)
Physical Therapy Session Note  Patient Details  Name: Barbara [name] MRN: [id number] Date of Birth: Oct 17, 1940  Today's Date: 10/17/2017 PT Individual Time: 1500-1600 PT Individual Time Calculation (min): 60 min   Short Term Goals: Week 1:  PT Short Term Goal 1 (Week 1): Pt will transfer with LRAD and supervision PT Short Term Goal 2 (Week 1): Pt will ambulate 100' with LRAD and min guard PT Short Term Goal 3 (Week 1): Pt will complete standardized balance assessment PT Short Term Goal 4 (Week 1): Pt will propel w/c 100' with BUEs for strengthening and cardiovascular endurance  Skilled Therapeutic Interventions/Progress Updates:    c/o soreness and requesting pain medication, RN in at start of session.  Session focus on activity tolerance via gait and LE therex.    Pt transitions to EOB mod I with bed features and stand/pivot to w/c on R with RW and distant supervision.  Gait training in therapy gym x280' with RW and supervision, pt states "I probably could go farther but let's rest."  LE therex 2x5 sit<>stand with BUE support, x10 reps mini-squats, x10 reps heel raises, 2x10 reps LAQ and hip flexion with 1# weight.  Pt propelled w/c back to room with BUEs reporting some "pinching" but tolerable in abdomen.  Agreeable to stay up in w/c and daughter signed off to transfer back to bed when ready.  Discussed potentially moving d/c date up earlier, will discuss in conference tomorrow with team.  Call bell in reach and needs met.   Therapy Documentation Precautions:  Precautions Precautions: Fall Precaution Comments: pt with wound VAC Restrictions Weight Bearing Restrictions: No   See Function Navigator for Current Functional Status.   Therapy/Group: Individual Therapy  Michel Santee 10/17/2017, 5:03 PM

## 2017-10-17 NOTE — Progress Notes (Signed)
[facility] PHYSICAL MEDICINE & REHABILITATION     PROGRESS NOTE    Subjective/Complaints: Abdominal pain improved, patient is more alert she toileted with therapy supervision on the commode today   ROS: Patient denies CP/SOB, nausea, vomiting, diarrhea,    Objective:  No results found. No results for input(s): WBC, HGB, HCT, PLT in the last 72 hours. No results for input(s): NA, K, CL, GLUCOSE, BUN, CREATININE, CALCIUM in the last 72 hours.  Invalid input(s): CO CBG (last 3)  No results for input(s): GLUCAP in the last 72 hours.  Wt Readings from Last 3 Encounters:  10/17/17 60 kg (132 lb 4.4 oz)  10/10/17 66.3 kg (146 lb 2.6 oz)  09/24/17 62.7 kg (138 lb 4.8 oz)     Intake/Output Summary (Last 24 hours) at 10/17/2017 1000 Last data filed at 10/16/2017 2330 Gross per 24 hour  Intake 240 ml  Output 2 ml  Net 238 ml    Vital Signs: Blood pressure (!) 172/58, pulse 70, temperature 98.4 F (36.9 C), temperature source Oral, resp. rate 17, height _0  (1.626 m), weight 60 kg (132 lb 4.4 oz), SpO2 96 %. Physical Exam:  Constitutional: No distress . Vital signs reviewed. HEENT: EOMI, oral membranes moist Neck: supple Cardiovascular: RRR without murmur. No JVD    Respiratory: CTA Bilaterally without wheezes or rales. Normal effort    GI: Remains tender.  Vac in place.  Ostomy sealed.  Bowel sounds positive Epigastric tenderness  Musculoskeletal:  No edema or tenderness in extremities  Neurological: She is alert.  Oriented 3 Motor: 4/5 in all 4, less proximally Skin: Skin is warm and dry.  Psychiatric: Affect flat    Assessment/Plan: 1. Debility after multiple medical which require 3+ hours per day of interdisciplinary therapy in a comprehensive inpatient rehab setting. Physiatrist is providing close team supervision and 24 hour management of active medical problems listed below. Physiatrist and rehab team continue to assess barriers to discharge/monitor patient  progress toward functional and medical goals.  Function:  Bathing Bathing position   Position: Sitting EOB  Bathing parts Body parts bathed by patient: Right arm, Left arm, Chest, Abdomen, Front perineal area, Right upper leg, Left upper leg, Right lower leg, Left lower leg, Buttocks Body parts bathed by helper: Back  Bathing assist Assist Level: Supervision or verbal cues      Upper Body Dressing/Undressing Upper body dressing   What is the patient wearing?: Pull over shirt/dress     Pull over shirt/dress - Perfomed by patient: Thread/unthread right sleeve, Thread/unthread left sleeve, Put head through opening, Pull shirt over trunk          Upper body assist Assist Level: Set up   Set up : To obtain clothing/put away  Lower Body Dressing/Undressing Lower body dressing   What is the patient wearing?: Underwear Underwear - Performed by patient: Thread/unthread right underwear leg, Thread/unthread left underwear leg, Pull underwear up/down Underwear - Performed by helper: Thread/unthread right underwear leg, Thread/unthread left underwear leg Pants- Performed by patient: Pull pants up/down, Thread/unthread right pants leg, Thread/unthread left pants leg Pants- Performed by helper: Thread/unthread left pants leg, Thread/unthread right pants leg   Non-skid slipper socks- Performed by helper: Don/doff right sock, Don/doff left sock     Shoes - Performed by patient: Don/doff right shoe, Don/doff left shoe         TED Hose - Performed by helper: Don/doff right TED hose, Don/doff left TED hose  Lower body assist Assist for lower  body dressing: Supervision or verbal cues      Toileting Toileting   Toileting steps completed by patient: Performs perineal hygiene, Adjust clothing prior to toileting, Adjust clothing after toileting Toileting steps completed by helper: Adjust clothing prior to toileting, Adjust clothing after toileting    Toileting assist Assist level:  Supervision or verbal cues   Transfers Chair/bed transfer   Chair/bed transfer method: Stand pivot Chair/bed transfer assist level: Touching or steadying assistance (Pt > 75%) Chair/bed transfer assistive device: Walker, Air cabin crew     Max distance: 30 ft Assist level: Touching or steadying assistance (Pt > 75%)   Wheelchair       Assist Level: Dependent (Pt equals 0%)(fatigue, not yet self propelling wheelchair)  Cognition Comprehension Comprehension assist level: Follows basic conversation/direction with extra time/assistive device  Expression Expression assist level: Expresses basic needs/ideas: With extra time/assistive device  Social Interaction Social Interaction assist level: Interacts appropriately with others with medication or extra time (anti-anxiety, antidepressant).  Problem Solving Problem solving assist level: Solves basic problems with no assist  Memory Memory assist level: Recognizes or recalls 50 - 74% of the time/requires cueing 25 - 49% of the time   Medical Problem List and Plan: 1.  Debility secondary to peritonitis/sepsis.  Status post left and sigmoid colectomy with colostomy  -continue therapies CIR PT, OT, therapy notes reviewed Much brighter today expect some improved therapy participation. 2.  DVT Prophylaxis/Anticoagulation: Subcutaneous heparin.  Monitor for any bleeding episodes 3. Pain Management: Oxycodone and Robaxin as needed  -pain at times but improving. Prn meds effective. Need to pretreat before activities 4. Mood: Provide emotional support 5. Neuropsych: This patient is not fully capable of making decisions on her own behalf. 6. Skin/Wound Care: Routine skin checks 7. Fluids/Electrolytes/Nutrition:    -Fortunately weights are fairly stable at this point [facility] Weights   10/15/17 0414 10/16/17 0547 10/17/17 0500  Weight: 63.2 kg (139 lb 5.3 oz) 63.1 kg (139 lb 1.8 oz) 60 kg (132 lb 4.4 oz)   - pt states she'll  drink nutritional supplements  Low  pre-albumin-dietary consult pending, question if weight from 10/17/2017 is accurate, be met from 5/24 was normal 8.  PAF.  Cardiac rate controlled.  Tambocor 100 mg twice daily  -Heart rate appears well controlled at present 9.  Diabetes mellitus.  Hemoglobin A1c 6.4.    -Lantus increased to 10 units at bedtime on 5/22  -Continue to monitor blood sugars  -continue SSI  CBG (last 3)  No results for input(s): GLUCAP in the last 72 hours.Order ac and HS CBGs 10.  History of GI bleed. Epigastric tenderness  improved with increase Protonix to BID.  No bleeding episodes  -hgb 9.0   LOS (Days) 7 A FACE TO FACE EVALUATION WAS PERFORMED  Charlett Blake, MD 10/17/2017 10:00 AM

## 2017-10-18 ENCOUNTER — Inpatient Hospital Stay (HOSPITAL_COMMUNITY): Payer: Medicare Other | Admitting: Physical Therapy

## 2017-10-18 ENCOUNTER — Inpatient Hospital Stay (HOSPITAL_COMMUNITY): Payer: Medicare Other

## 2017-10-18 LAB — CBC WITH DIFFERENTIAL/PLATELET
ABS IMMATURE GRANULOCYTES: 0 10*3/uL (ref 0.0–0.1)
Basophils Absolute: 0.1 10*3/uL (ref 0.0–0.1)
Basophils Relative: 1 %
Eosinophils Absolute: 0.7 10*3/uL (ref 0.0–0.7)
Eosinophils Relative: 6 %
HCT: 30.9 % — ABNORMAL LOW (ref 36.0–46.0)
HEMOGLOBIN: 9.9 g/dL — AB (ref 12.0–15.0)
IMMATURE GRANULOCYTES: 0 %
LYMPHS ABS: 0.8 10*3/uL (ref 0.7–4.0)
Lymphocytes Relative: 7 %
MCH: 28.9 pg (ref 26.0–34.0)
MCHC: 32 g/dL (ref 30.0–36.0)
MCV: 90.4 fL (ref 78.0–100.0)
MONO ABS: 0.8 10*3/uL (ref 0.1–1.0)
Monocytes Relative: 7 %
NEUTROS ABS: 8.5 10*3/uL — AB (ref 1.7–7.7)
NEUTROS PCT: 79 %
Platelets: 266 10*3/uL (ref 150–400)
RBC: 3.42 MIL/uL — ABNORMAL LOW (ref 3.87–5.11)
RDW: 14.1 % (ref 11.5–15.5)
WBC: 10.8 10*3/uL — ABNORMAL HIGH (ref 4.0–10.5)

## 2017-10-18 LAB — GLUCOSE, CAPILLARY
GLUCOSE-CAPILLARY: 127 mg/dL — AB (ref 65–99)
GLUCOSE-CAPILLARY: 240 mg/dL — AB (ref 65–99)
GLUCOSE-CAPILLARY: 229 mg/dL — AB (ref 65–99)
GLUCOSE-CAPILLARY: 294 mg/dL — AB (ref 65–99)
GLUCOSE-CAPILLARY: 242 mg/dL — AB (ref 65–99)
GLUCOSE-CAPILLARY: 147 mg/dL — AB (ref 65–99)
Glucose-Capillary: 201 mg/dL — ABNORMAL HIGH (ref 65–99)
Glucose-Capillary: 185 mg/dL — ABNORMAL HIGH (ref 65–99)
Glucose-Capillary: 309 mg/dL — ABNORMAL HIGH (ref 65–99)
Glucose-Capillary: 178 mg/dL — ABNORMAL HIGH (ref 65–99)
Glucose-Capillary: 178 mg/dL — ABNORMAL HIGH (ref 65–99)
Glucose-Capillary: 199 mg/dL — ABNORMAL HIGH (ref 65–99)
Glucose-Capillary: 236 mg/dL — ABNORMAL HIGH (ref 65–99)
Glucose-Capillary: 138 mg/dL — ABNORMAL HIGH (ref 65–99)
Glucose-Capillary: 158 mg/dL — ABNORMAL HIGH (ref 65–99)

## 2017-10-18 MED ORDER — POLYETHYLENE GLYCOL 3350 17 G PO PACK
17.0000 g | PACK | Freq: Every day | ORAL | Status: DC
  Administered 2017-10-18 – 2017-10-20 (×3): 17 g via ORAL
  Filled 2017-10-18 (×2): qty 1

## 2017-10-18 MED ORDER — INSULIN GLARGINE 100 UNIT/ML ~~LOC~~ SOLN
14.0000 [IU] | Freq: Every day | SUBCUTANEOUS | Status: DC
  Administered 2017-10-18 – 2017-10-19 (×2): 14 [IU] via SUBCUTANEOUS
  Filled 2017-10-18 (×2): qty 0.14

## 2017-10-18 MED ORDER — PREMIER PROTEIN SHAKE
11.0000 [oz_av] | Freq: Three times a day (TID) | ORAL | Status: DC
  Administered 2017-10-18 – 2017-10-19 (×5): 11 [oz_av] via ORAL
  Filled 2017-10-18 (×10): qty 325.31

## 2017-10-18 NOTE — Progress Notes (Signed)
Physical Therapy Session Note  Patient Details  Name: Barbara [name] MRN: [id number] Date of Birth: Oct 19, 1940  Today's Date: 10/18/2017 PT Individual Time: 0900-1000 PT Individual Time Calculation (min): 60 min   Short Term Goals: Week 1:  PT Short Term Goal 1 (Week 1): Pt will transfer with LRAD and supervision PT Short Term Goal 2 (Week 1): Pt will ambulate 100' with LRAD and min guard PT Short Term Goal 3 (Week 1): Pt will complete standardized balance assessment PT Short Term Goal 4 (Week 1): Pt will propel w/c 100' with BUEs for strengthening and cardiovascular endurance  Skilled Therapeutic Interventions/Progress Updates:    no c/o pain but occasional grimace with movement, worse at end of session.  Session focus on activity tolerance and strengthening via functional mobility retraining and therex.    Pt negotiates 2x4 steps with 2 rails and supervision, alternating pattern.  Educated pt on use of single rail if needed with BUE support for home entry at her daughter's home.  Static standing heel cord stretch on foam wedge x1 minute, pt stating she feels unsteady and requires min assist and LUE HHA.  PT reviewed pt's HEP and provided handout.  Pt completes 2x10 reps LAQ, hip flexion, ball squeezes, hip abduction with level 2 theraband, and seated hamstring stretch.  Pt initiated gait training with RW and initially states she cannot continue, however as she returned to her chair states she feels better and asking to continue.  Ambulated a total of 200' with RW and supervision.  Pt returned to room in w/c at end of session, requesting to stay upright in chair.  Call bell in reach and needs met.   Therapy Documentation Precautions:  Precautions Precautions: Fall Precaution Comments: pt with wound VAC Restrictions Weight Bearing Restrictions: No   See Function Navigator for Current Functional Status.   Therapy/Group: Individual Therapy  Michel Santee 10/18/2017, 10:04 AM

## 2017-10-18 NOTE — Progress Notes (Signed)
Daughter Manuela Schwartz notified of updates and new orders, support and reassurance provided

## 2017-10-18 NOTE — Progress Notes (Signed)
Occupational Therapy Note  Patient Details  Name: Barbara [name] MRN: [id number] Date of Birth: 10/19/1940  Today's Date: 10/18/2017 OT Individual Time: 1130-1200 OT Individual Time Calculation (min): 30 min   Pt c/o abdominal discomfort "but getting better" Individual Therapy  Pt initially engaged in sit<>stand and stand pivot transfers at supervision level.  Pt performed bed mobility at supervision level.  Pt engaged in BUE therex with 3# bar sitting EOB: curls, chest presses, shoulder presses.  Pt returned to supine and remained in bed with all needs within reach, visitors present, and bed alarm activated.    [name] 10/18/2017, 12:01 PM

## 2017-10-18 NOTE — Progress Notes (Signed)
Occupational Therapy Session Note  Patient Details  Name: DAJHA URQUILLA MRN: [id number] Date of Birth: [birth year]  Today's Date: 10/18/2017 OT Individual Time: 8889-1694 OT Individual Time Calculation (min): 46 min    Short Term Goals: Week 1:  OT Short Term Goal 1 (Week 1): Pt will be able to tolerate standing for 2-3 minutes to be able to complete grooming in standing.  OT Short Term Goal 2 (Week 1): Pt will be able to don pants with steadying A. OT Short Term Goal 3 (Week 1): pt will be able to wash bottom with steadying A. OT Short Term Goal 4 (Week 1): Pt will be able to transfer on and off toilet with with RW with steadying A.  Skilled Therapeutic Interventions/Progress Updates:    Pt received supine in bed agreeable to therapy. Session focused on functional mobility, IADL tasks, and functional activity tolerance. Pt donned shirt and pants sitting EOB with (S), requiring no rest breaks with no change in vitals or SOB. Pt completed standing level grooming tasks at sink with discussion re energy conservation strategies at home. Discussion re ostomy care, but pt declined practicing this session. Pt was transported to kitchen for time management. Pt completed functional mobility around kitchen with RW, operating microwave and preparing a cup of coffee with vc for RW management provided throughout. Pt was returned to room and left in w/c with all needs met.   Therapy Documentation Precautions:  Precautions Precautions: Fall Precaution Comments: pt with wound VAC Restrictions Weight Bearing Restrictions: No  Vital Signs: Therapy Vitals Temp: 98.7 F (37.1 C) Temp Source: Oral Pulse Rate: 66 Resp: 17 BP: (!) 141/49 Patient Position (if appropriate): Lying Oxygen Therapy SpO2: 94 % O2 Device: Room Air Pain: Pain Assessment Pain Scale: 0-10 Pain Score: 3  Pain Location: Abdomen Pain Orientation: Mid Pain Descriptors / Indicators: Aching Pain Onset: On-going Pain  Intervention(s): Emotional support;Ambulation/increased activity ADL: ADL ADL Comments: refer to functional navigator  See Function Navigator for Current Functional Status.   Therapy/Group: Individual Therapy  Curtis Sites 10/18/2017, 8:20 AM

## 2017-10-18 NOTE — Progress Notes (Signed)
Physical Therapy Session Note  Patient Details  Name: ADALAI PERL MRN: [id number] Date of Birth: 04-10-1941  Today's Date: 10/18/2017 PT Individual Time: 1300-1315 PT Individual Time Calculation (min): 15 min   Short Term Goals: Week 1:  PT Short Term Goal 1 (Week 1): Pt will transfer with LRAD and supervision PT Short Term Goal 2 (Week 1): Pt will ambulate 100' with LRAD and min guard PT Short Term Goal 3 (Week 1): Pt will complete standardized balance assessment PT Short Term Goal 4 (Week 1): Pt will propel w/c 100' with BUEs for strengthening and cardiovascular endurance  Skilled Therapeutic Interventions/Progress Updates:    c/o 10/10 pain in LUQ, RN aware and in to assess.  Pt declining therapy.  PT attempted education on out of bed tolerance to assist with pain control and activity, but pt continued to decline.  Offered up to chair, toileting, etc and pt wishes to remain in bed.  Pt remains in bed with call bell in reach and needs met.   Therapy Documentation Precautions:  Precautions Precautions: Fall Precaution Comments: pt with wound VAC Restrictions Weight Bearing Restrictions: No   See Function Navigator for Current Functional Status.   Therapy/Group: Individual Therapy  Michel Santee 10/18/2017, 2:20 PM

## 2017-10-18 NOTE — Consult Note (Signed)
Discussed plan of care with patient and daughter at the bedside; she will be present on Thursday at 0900 for an ostomy teaching session and Vac change will be performed at that time, instead of Wed.  Julien Girt MSN, RN, [place], [place], [place]

## 2017-10-18 NOTE — Progress Notes (Signed)
On call Pam Love notified of Temperature of 100.7 will call back , new orders received for stat CXR PA/LAT, CBC w diff, urine routine and culture, and obtain Blood culture if Temp, is 101. Patient informed

## 2017-10-18 NOTE — Progress Notes (Signed)
[place] PHYSICAL MEDICINE & REHABILITATION     PROGRESS NOTE    Subjective/Complaints: Belly pain improving. Feels that her appetite is a little better too.   ROS: Patient denies fever, rash, sore throat, blurred vision, nausea, vomiting, diarrhea, cough, shortness of breath or chest pain, joint or back pain, headache, or mood change.    Objective:  No results found. No results for input(s): WBC, HGB, HCT, PLT in the last 72 hours. No results for input(s): NA, K, CL, GLUCOSE, BUN, CREATININE, CALCIUM in the last 72 hours.  Invalid input(s): CO CBG (last 3)  No results for input(s): GLUCAP in the last 72 hours.  Wt Readings from Last 3 Encounters:  10/18/17 59.9 kg (132 lb 0.9 oz)  10/10/17 66.3 kg (146 lb 2.6 oz)  09/24/17 62.7 kg (138 lb 4.8 oz)     Intake/Output Summary (Last 24 hours) at 10/18/2017 0919 Last data filed at 10/18/2017 0421 Gross per 24 hour  Intake 120 ml  Output 300 ml  Net -180 ml    Vital Signs: Blood pressure (!) 141/49, pulse 66, temperature 98.7 F (37.1 C), temperature source Oral, resp. rate 17, height 5\' 4"  (1.626 m), weight 59.9 kg (132 lb 0.9 oz), SpO2 94 %. Physical Exam:  Constitutional: No distress . Vital signs reviewed. HEENT: EOMI, oral membranes moist Neck: supple Cardiovascular: IRR without murmur. No JVD    Respiratory: CTA Bilaterally without wheezes or rales. Normal effort    GI: BS +, tender, vac/ostomy intact  Epigastric tenderness  Musculoskeletal:  No edema or tenderness in extremities  Neurological: She is alert.  Oriented 3 Motor: 4/5 in all 4, less proximally Skin: Skin is warm and dry.  Psychiatric: Affect flat    Assessment/Plan: 1. Debility after multiple medical which require 3+ hours per day of interdisciplinary therapy in a comprehensive inpatient rehab setting. Physiatrist is providing close team supervision and 24 hour management of active medical problems listed below. Physiatrist and rehab team  continue to assess barriers to discharge/monitor patient progress toward functional and medical goals.  Function:  Bathing Bathing position   Position: Sitting EOB  Bathing parts Body parts bathed by patient: Right arm, Left arm, Chest, Abdomen, Front perineal area, Right upper leg, Left upper leg, Right lower leg, Left lower leg, Buttocks Body parts bathed by helper: Back  Bathing assist Assist Level: Supervision or verbal cues      Upper Body Dressing/Undressing Upper body dressing   What is the patient wearing?: Pull over shirt/dress     Pull over shirt/dress - Perfomed by patient: Thread/unthread right sleeve, Thread/unthread left sleeve, Put head through opening, Pull shirt over trunk          Upper body assist Assist Level: Set up   Set up : To obtain clothing/put away  Lower Body Dressing/Undressing Lower body dressing   What is the patient wearing?: Underwear Underwear - Performed by patient: Thread/unthread right underwear leg, Thread/unthread left underwear leg, Pull underwear up/down Underwear - Performed by helper: Thread/unthread right underwear leg, Thread/unthread left underwear leg Pants- Performed by patient: Pull pants up/down, Thread/unthread right pants leg, Thread/unthread left pants leg Pants- Performed by helper: Thread/unthread left pants leg, Thread/unthread right pants leg   Non-skid slipper socks- Performed by helper: Don/doff right sock, Don/doff left sock     Shoes - Performed by patient: Don/doff right shoe, Don/doff left shoe         TED Hose - Performed by helper: Don/doff right TED hose, Don/doff left  TED hose  Lower body assist Assist for lower body dressing: Supervision or verbal cues      Toileting Toileting   Toileting steps completed by patient: Performs perineal hygiene, Adjust clothing prior to toileting, Adjust clothing after toileting Toileting steps completed by helper: Adjust clothing prior to toileting, Adjust clothing after  toileting    Toileting assist Assist level: Supervision or verbal cues   Transfers Chair/bed transfer   Chair/bed transfer method: Stand pivot Chair/bed transfer assist level: Supervision or verbal cues Chair/bed transfer assistive device: Walker, Air cabin crew     Max distance: 280 Assist level: Supervision or verbal cues   Wheelchair       Assist Level: Dependent (Pt equals 0%)(fatigue, not yet self propelling wheelchair)  Cognition Comprehension Comprehension assist level: Follows basic conversation/direction with extra time/assistive device  Expression Expression assist level: Expresses basic needs/ideas: With extra time/assistive device  Social Interaction Social Interaction assist level: Interacts appropriately with others with medication or extra time (anti-anxiety, antidepressant).  Problem Solving Problem solving assist level: Solves basic problems with no assist  Memory Memory assist level: Recognizes or recalls 75 - 89% of the time/requires cueing 10 - 24% of the time   Medical Problem List and Plan: 1.  Debility secondary to peritonitis/sepsis.  Status post left and sigmoid colectomy with colostomy  -continue therapies CIR PT, OT, therapy notes reviewed  -team conference today. 2.  DVT Prophylaxis/Anticoagulation: Subcutaneous heparin.  Monitor for any bleeding episodes 3. Pain Management: Oxycodone and Robaxin as needed  -pain at times but improving. Prn meds effective. Need to pretreat before activities 4. Mood: Provide emotional support 5. Neuropsych: This patient is not fully capable of making decisions on her own behalf. 6. Skin/Wound Care: Routine skin checks  -WOC RN follo wup. Wound appears to be improving 7. Fluids/Electrolytes/Nutrition:      Filed Weights   10/17/17 0500 10/18/17 0420 10/18/17 0645  Weight: 60 kg (132 lb 4.4 oz) 59.9 kg (132 lb 0.9 oz) 59.9 kg (132 lb 0.9 oz)   - pt states she'll drink nutritional  supplements  -prealbumin is 5, eating here and there, drinking supps, weight trending down  -dietary follow up  -will need to have another talk with daughter too re: intake 8.  PAF.  Cardiac rate controlled.  Tambocor 100 mg twice daily  -Heart rate  well controlled at present 9.  Diabetes mellitus.  Hemoglobin A1c 6.4.    -Lantus increased to 10 units at bedtime on 5/22  -Continue to monitor blood sugars  -continue SSI  CBG (last 3)  No results for input(s): GLUCAP in the last 72 hours.Order ac and HS CBGs   10.  History of GI bleed.   Epigastric tenderness  improved with increase Protonix to BID.  No bleeding episodes  -hgb 9.0   LOS (Days) 8 A FACE TO FACE EVALUATION WAS PERFORMED  Meredith Staggers, MD 10/18/2017 9:19 AM

## 2017-10-18 NOTE — Progress Notes (Addendum)
Speech Language Pathology Daily Session Note  Patient Details  Name: Barbara [name] MRN: [id number] Date of Birth: 05-17-1941  Today's Date: 10/18/2017 SLP Individual Time: 1430-1500 SLP Individual Time Calculation (min): 30 min  Short Term Goals: Week 1: SLP Short Term Goal 1 (Week 1): Pt will demonstrate sustained attention to functional tasks for 20 minutes with min A verbal cues for redirection. SLP Short Term Goal 2 (Week 1): Pt utilize external memory aids to recall new, daily information with min A verbal and question cues.  SLP Short Term Goal 3 (Week 1): Pt will demonstrate funtional problem solving for basic and familiar (mildly complex) tasks with min A verbal cues. SLP Short Term Goal 4 (Week 1): Pt will self-mointor and correct functional errors during problem soving tasks with mod A verbal cues.   Skilled Therapeutic Interventions:Skilled ST services focused on cognitive skills. SLP facilitated recall utilzing visual aid, semi-complex problem solving, error awareness/correction and selective attention in moderately distracting environment for 30 minutes during completion of personal medication management, pt required min A fading to supervision A verbal cues. SLP adjusted LTG to Min-Supervision A due to reduced ELOS, however continuing to demonstrate great progress. Pt was left in room with call bell within reach. Reccomend to continue skilled ST services.      Function:  Eating Eating     Eating Assist Level: No help, No cues           Cognition Comprehension Comprehension assist level: Follows basic conversation/direction with extra time/assistive device  Expression   Expression assist level: Expresses basic needs/ideas: With extra time/assistive device  Social Interaction Social Interaction assist level: Interacts appropriately with others with medication or extra time (anti-anxiety, antidepressant).  Problem Solving Problem solving assist level: Solves complex 90%  of the time/cues < 10% of the time  Memory Memory assist level: Recognizes or recalls 75 - 89% of the time/requires cueing 10 - 24% of the time    Pain Pain Assessment Pain Score: 0-No pain  Therapy/Group: Individual Therapy  [name]  [facility] 10/18/2017, 3:45 PM

## 2017-10-18 NOTE — Progress Notes (Signed)
Day 1 of 2: Calorie Count Note  48 hour calorie count ordered.  Diet: Carbohydrate modified diet with thin liquids Supplements:  Premier Protein po TID, each supplement provides 160 kcal and 30 grams of protein.   Breakfast: 58 kcal, 3 grams of protein Lunch: 170 kcal, 9 grams of protein Dinner: 170 kcal, 9 grams of protein Supplements: 320 kcal, 60 grams of protein  Total intake: 718 kcal (44% of minimum kcal estimated needs)  81 grams of protein (95% of minimum protein estimated needs)  Estimated Nutritional Needs:  Kcal:  1650-1850 Protein:  85-95 grams Fluid:  1.6 - 1.8 L/day  Nutrition Dx:  Inadequate oral intake related to poor appetite as evidenced by per patient/family report; ongoing  Goal:  Pt to meet >/= 90% of their estimated nutrition needs; progressing  Intervention:   Continue calorie count. RD to follow up tomorrow with final results.   Continue Premier Protein po TID, each supplement provides 160 kcal and 30 grams of protein.   Provide Magic cup TID with meals, each supplement provides 290 kcal and 9 grams of protein.   Corrin Parker, MS, RD, LDN Pager # [phone] After hours/ weekend pager # [phone]

## 2017-10-19 ENCOUNTER — Inpatient Hospital Stay (HOSPITAL_COMMUNITY): Payer: Medicare Other | Admitting: Physical Therapy

## 2017-10-19 ENCOUNTER — Inpatient Hospital Stay (HOSPITAL_COMMUNITY): Payer: Medicare Other

## 2017-10-19 ENCOUNTER — Inpatient Hospital Stay (HOSPITAL_COMMUNITY): Payer: Medicare Other | Admitting: Occupational Therapy

## 2017-10-19 LAB — GLUCOSE, CAPILLARY
GLUCOSE-CAPILLARY: 169 mg/dL — AB (ref 65–99)
GLUCOSE-CAPILLARY: 171 mg/dL — AB (ref 65–99)
Glucose-Capillary: 281 mg/dL — ABNORMAL HIGH (ref 65–99)
Glucose-Capillary: 259 mg/dL — ABNORMAL HIGH (ref 65–99)

## 2017-10-19 LAB — URINALYSIS, ROUTINE W REFLEX MICROSCOPIC
Bilirubin Urine: NEGATIVE
GLUCOSE, UA: 50 mg/dL — AB
Ketones, ur: 80 mg/dL — AB
Nitrite: NEGATIVE
PROTEIN: NEGATIVE mg/dL
Specific Gravity, Urine: 1.016 (ref 1.005–1.030)
pH: 6 (ref 5.0–8.0)

## 2017-10-19 NOTE — Progress Notes (Signed)
[place] PHYSICAL MEDICINE & REHABILITATION     PROGRESS NOTE    Subjective/Complaints: Had a good day with therapy yesterday. Unhappy with what was brought for breakfast. States she's trying to eat more  ROS: Patient denies fever, rash, sore throat, blurred vision, nausea, vomiting, diarrhea, cough, shortness of breath or chest pain, joint or back pain, headache, or mood change.   Objective:  Dg Chest 2 View  Result Date: 10/18/2017 CLINICAL DATA:  Fever. EXAM: CHEST - 2 VIEW COMPARISON:  Oct 04, 2017 FINDINGS: No pneumothorax. Small bilateral pleural effusions with underlying opacities, left greater than right. The cardiomediastinal silhouette is stable. Probable pulmonary venous congestion. No nodules or masses. IMPRESSION: Small bilateral effusions with underlying atelectasis. Pulmonary venous congestion. Electronically Signed   By: Dorise Bullion III M.D   On: 10/18/2017 22:16   Recent Labs    10/18/17 2117  WBC 10.8*  HGB 9.9*  HCT 30.9*  PLT 266   No results for input(s): NA, K, CL, GLUCOSE, BUN, CREATININE, CALCIUM in the last 72 hours.  Invalid input(s): CO CBG (last 3)  Recent Labs    10/18/17 1621 10/18/17 2131 10/19/17 0640  GLUCAP 138* 158* 169*    Wt Readings from Last 3 Encounters:  10/19/17 61.9 kg (136 lb 7.4 oz)  10/10/17 66.3 kg (146 lb 2.6 oz)  09/24/17 62.7 kg (138 lb 4.8 oz)     Intake/Output Summary (Last 24 hours) at 10/19/2017 0840 Last data filed at 10/18/2017 2300 Gross per 24 hour  Intake 100 ml  Output 200 ml  Net -100 ml    Vital Signs: Blood pressure (!) 134/40, pulse 73, temperature 99.1 F (37.3 C), temperature source Oral, resp. rate 17, height 5\' 4"  (1.626 m), weight 61.9 kg (136 lb 7.4 oz), SpO2 (!) 87 %. Physical Exam:  Constitutional: No distress . Vital signs reviewed. HEENT: EOMI, oral membranes moist Neck: supple Cardiovascular: RRR without murmur. No JVD    Respiratory: CTA Bilaterally without wheezes or rales.  Normal effort    GI: BS+ tender, vac/ostomy intact  Musculoskeletal:  No edema or tenderness in extremities  Neurological: She is alert.  Oriented 3 Motor: 4/5 in all 4, less proximally Skin: Skin is warm and dry.  Psychiatric: Affect more up beat    Assessment/Plan: 1. Debility after multiple medical which require 3+ hours per day of interdisciplinary therapy in a comprehensive inpatient rehab setting. Physiatrist is providing close team supervision and 24 hour management of active medical problems listed below. Physiatrist and rehab team continue to assess barriers to discharge/monitor patient progress toward functional and medical goals.  Function:  Bathing Bathing position   Position: Sitting EOB  Bathing parts Body parts bathed by patient: Right arm, Left arm, Chest, Abdomen, Front perineal area, Right upper leg, Left upper leg, Right lower leg, Left lower leg, Buttocks Body parts bathed by helper: Back  Bathing assist Assist Level: Supervision or verbal cues      Upper Body Dressing/Undressing Upper body dressing   What is the patient wearing?: Pull over shirt/dress     Pull over shirt/dress - Perfomed by patient: Thread/unthread right sleeve, Thread/unthread left sleeve, Put head through opening, Pull shirt over trunk          Upper body assist Assist Level: Set up   Set up : To obtain clothing/put away  Lower Body Dressing/Undressing Lower body dressing   What is the patient wearing?: Pants Underwear - Performed by patient: Thread/unthread right underwear leg, Thread/unthread left  underwear leg, Pull underwear up/down Underwear - Performed by helper: Thread/unthread right underwear leg, Thread/unthread left underwear leg Pants- Performed by patient: Pull pants up/down, Thread/unthread right pants leg, Thread/unthread left pants leg Pants- Performed by helper: Thread/unthread left pants leg, Thread/unthread right pants leg   Non-skid slipper socks- Performed by  helper: Don/doff right sock, Don/doff left sock     Shoes - Performed by patient: Don/doff right shoe, Don/doff left shoe         TED Hose - Performed by helper: Don/doff right TED hose, Don/doff left TED hose  Lower body assist Assist for lower body dressing: Supervision or verbal cues      Toileting Toileting   Toileting steps completed by patient: Adjust clothing prior to toileting, Performs perineal hygiene, Adjust clothing after toileting Toileting steps completed by helper: Adjust clothing prior to toileting, Adjust clothing after toileting    Toileting assist Assist level: Supervision or verbal cues   Transfers Chair/bed transfer   Chair/bed transfer method: Stand pivot Chair/bed transfer assist level: Supervision or verbal cues Chair/bed transfer assistive device: Walker, Air cabin crew     Max distance: 280 Assist level: Supervision or verbal cues   Wheelchair       Assist Level: Dependent (Pt equals 0%)(fatigue, not yet self propelling wheelchair)  Cognition Comprehension Comprehension assist level: Follows basic conversation/direction with extra time/assistive device  Expression Expression assist level: Expresses basic needs/ideas: With extra time/assistive device  Social Interaction Social Interaction assist level: Interacts appropriately with others with medication or extra time (anti-anxiety, antidepressant).  Problem Solving Problem solving assist level: Solves complex 90% of the time/cues < 10% of the time  Memory Memory assist level: Recognizes or recalls 75 - 89% of the time/requires cueing 10 - 24% of the time   Medical Problem List and Plan: 1.  Debility secondary to peritonitis/sepsis.  Status post left and sigmoid colectomy with colostomy  -continue therapies CIR PT, OT,   -ELOS 5/30 2.  DVT Prophylaxis/Anticoagulation: Subcutaneous heparin.  Monitor for any bleeding episodes 3. Pain Management: Oxycodone and Robaxin as  needed  -pain at times but improving. Prn meds effective. Need to pretreat before activities 4. Mood: Provide emotional support 5. Neuropsych: This patient is not fully capable of making decisions on her own behalf. 6. Skin/Wound Care: Routine skin checks  -WOC RN follo wup. vac ed tomorrow 7. Fluids/Electrolytes/Nutrition:      Filed Weights   10/18/17 0420 10/18/17 0645 10/19/17 0544  Weight: 59.9 kg (132 lb 0.9 oz) 59.9 kg (132 lb 0.9 oz) 61.9 kg (136 lb 7.4 oz)   - she's drinking protein/nutritional supps  -prealbumin is 5, eating here and there, drinking supps, weight trending down  -dietary follow up  -have discussed with patient at length the importance of her nutrition 8.  PAF.  Cardiac rate controlled.  Tambocor 100 mg twice daily  -Heart rate  well controlled at present 9.  Diabetes mellitus.  Hemoglobin A1c 6.4.    -Lantus increased to 14 units at bedtime on 5/28  -Continue to monitor blood sugars  -continue SSI  CBG (last 3)  Recent Labs    10/18/17 1621 10/18/17 2131 10/19/17 0640  GLUCAP 138* 158* 169*  sugars are improving.   10.  History of GI bleed.   -protonix  -  No bleeding episodes  -hgb 9.0   LOS (Days) 9 A FACE TO FACE EVALUATION WAS PERFORMED  Meredith Staggers, MD 10/19/2017 8:40 AM

## 2017-10-19 NOTE — Progress Notes (Signed)
Speech Language Pathology Discharge Summary  Patient Details  Name: Barbara [name] MRN: [id number] Date of Birth: 1940/12/21  Today's Date: 10/19/2017 SLP Individual Time: 1030-1100 SLP Individual Time Calculation (min): 30 min   Skilled Therapeutic Interventions:  Skilled ST services focused on cognitive skills and education. SLP facilitated cognitive linguistic reassess with administration of MOCA version 7.1, pt demonstrated a score of 15 out 30 (n=>26) indicating Min-Mod A impairment attention, recall nad problem solving skills. SLP provided education on recall, attention and problem solving strategies, pt stated understanding and all questions were answered to satisfaction. Pt was left in room with call bell within reach. Recommend to continue skilled ST services.     Patient has met 4 of 4 long term goals.  Patient to discharge at [facility] level.  Reasons goals not met:     Clinical Impression/Discharge Summary: Pt demonstrated great progress meeting 3 out 4 goals, discharging at [facility] A, with impairments in semi-complex problem solving, error awareness/correction in problem solving tasks, recall of novel information and higher attention skills. Pt demonstrated strong increase in cognitive linguistic skills orginally scoring 14 out 30 on MOCA version 8.2 (n=>26) upon evaluation date and reassessed at discharge scoring 15 out 30 on MOCA version 7.1 (n=>26.) however demonstrates increased ability in functional task compared to formal assessments. Pt would continue to benefit from continued skilled ST services at next level of care, in order to maximize functional independence and reduce burden of care to return to independent level.   Care Partner:  Caregiver Able to Provide Assistance: Yes  Type of Caregiver Assistance: Physical;Cognitive  Recommendation:  Home Health SLP;Outpatient SLP;24 hour supervision/assistance  Rationale for SLP Follow Up: Maximize cognitive function and  independence;Reduce caregiver burden   Equipment:     Reasons for discharge: Discharged from hospital   Patient/Family Agrees with Progress Made and Goals Achieved: Yes   Function:  Eating Eating                 Cognition Comprehension Comprehension assist level: Follows basic conversation/direction with extra time/assistive device  Expression   Expression assist level: Expresses basic needs/ideas: With extra time/assistive device  Social Interaction Social Interaction assist level: Interacts appropriately with others with medication or extra time (anti-anxiety, antidepressant).  Problem Solving Problem solving assist level: Solves basic 75 - 89% of the time/requires cueing 10 - 24% of the time  Memory Memory assist level: Recognizes or recalls 75 - 89% of the time/requires cueing 10 - 24% of the time   Amerie Beaumont  [facility] 10/19/2017, 11:23 AM

## 2017-10-19 NOTE — Progress Notes (Signed)
Observed and monitor throughout shift, arouses easily denies intolerable pain rate pain 4/10  on pain scale, denies need for medication. Urine obtained per orders, Temp at 100,1 orally, able to encourage patient to consumed some po liquids  but refuse food. Monitor and assisted

## 2017-10-19 NOTE — Progress Notes (Signed)
Day 2 of 2: Calorie Count Note  48 hour calorie count ordered.  Diet: Carbohydrate modified diet with thin liquids Supplements:  Premier Protein po TID, each supplement provides 160 kcal and 30 grams of protein.  Magic cup TID with meals, each supplement provides 290 kcal and 9 grams of protein  Breakfast: 244 kcal, 6 grams of protein Lunch: 0% Dinner: no information given Supplements: 480 kcal, 90 grams of protein  Total intake: 724 kcal (44% of minimum kcal estimated needs)  96 grams of protein (100% of minimum protein estimated needs)  Estimated Nutritional Needs:  Kcal:  1650-1850 Protein:  85-95 grams Fluid:  1.6 - 1.8 L/day  Nutrition Dx:  Inadequate oral intake related to poor appetite as evidenced by per patient/family report; progressing  Goal:  Pt to meet >/= 90% of their estimated nutrition needs; progressing  Intervention:   Discontinue calorie count  Continue Premier Protein po TID, each supplement provides 160 kcal and 30 grams of protein.   Continue Magic cup TID with meals, each supplement provides 290 kcal and 9 grams of protein.  Recommend continuation of nutritional supplements post discharge.   Corrin Parker, MS, RD, LDN Pager # [phone] After hours/ weekend pager # [phone]

## 2017-10-19 NOTE — Discharge Summary (Signed)
Discharge summary job [phone]

## 2017-10-19 NOTE — Progress Notes (Signed)
Physical Therapy Discharge Summary  Patient Details  Name: Barbara [name] MRN: [id number] Date of Birth: [birth year]  Today's Date: 10/19/2017 PT Individual Time: 0900-1000 PT Individual Time Calculation (min): 60 min    Patient has met 8 of 8 long term goals due to improved activity tolerance, improved balance, improved postural control, increased strength, improved attention and improved awareness.  Patient to discharge at an ambulatory level Supervision.   Patient's care partner is independent to provide the necessary cognitive assistance at discharge.  Recommendation:  Patient will benefit from ongoing skilled PT services in home health setting to continue to advance safe functional mobility, address ongoing impairments in activity tolerance, strength, and safety awareness, and minimize fall risk.  Equipment: 16x16 w/c   Reasons for discharge: treatment goals met  Patient/family agrees with progress made and goals achieved: Yes   Skilled PT Intervention: C/o 4/10 pain, Rn aware.  Session focus on activity tolerance and d/c assessment.  Pt requires increasing cuing today for some mobility, but progresses with practice to mod I.  Transfers and gait with RW as below.  PT instructed pt in simulated car transfer at small SUV height with supervision.  Pt returned to room pushing w/c for activity tolerance at end of session.  Returned to bed mod I, call bell in reach and needs met.   PT Discharge Precautions/Restrictions Precautions Precautions: Fall Precaution Comments: pt with wound VAC Pain Pain Assessment Pain Scale: 0-10 Pain Score: 4  Pain Location: Abdomen Pain Orientation: Left;Upper Vision/Perception  Perception Perception: Within Functional Limits Praxis Praxis: Intact  Cognition Overall Cognitive Status: Within Functional Limits for tasks assessed Arousal/Alertness: Awake/alert Attention: Selective Sustained Attention: Appears intact Selective Attention: Appears  intact Safety/Judgment: Appears intact Sensation Sensation Light Touch: Appears Intact Coordination Gross Motor Movements are Fluid and Coordinated: Yes Fine Motor Movements are Fluid and Coordinated: Yes Motor  Motor Motor - Discharge Observations: ongoing deconditioning, but improving from evaluation; limited by pain  Mobility Bed Mobility Bed Mobility: Supine to Sit Supine to Sit: 5: Supervision;HOB flat;With rails Supine to Sit Details: Verbal cues for technique;Verbal cues for precautions/safety Transfers Transfers: Yes Stand Pivot Transfers: 6: Modified independent (Device/Increase time) Locomotion  Ambulation Ambulation: Yes Ambulation/Gait Assistance: 5: Supervision Ambulation Distance (Feet): 300 Feet Assistive device: Rolling walker Gait Gait: No Stairs / Additional Locomotion Stairs: Yes Stairs Assistance: 5: Supervision Stair Management Technique: Two rails Number of Stairs: 8  Trunk/Postural Assessment  Cervical Assessment Cervical Assessment: (long history of neck pain) Thoracic Assessment Thoracic Assessment: (forward rounded shoulders) Lumbar Assessment Lumbar Assessment: Within Functional Limits Postural Control Postural Control: Within Functional Limits  Balance Balance Balance Assessed: Yes Static Sitting Balance Static Sitting - Level of Assistance: 6: Modified independent (Device/Increase time) Dynamic Sitting Balance Dynamic Sitting - Level of Assistance: 5: Stand by assistance Static Standing Balance Static Standing - Level of Assistance: 5: Stand by assistance Dynamic Standing Balance Dynamic Standing - Level of Assistance: 5: Stand by assistance Extremity Assessment      RLE Assessment RLE Assessment: Within Functional Limits LLE Assessment LLE Assessment: Within Functional Limits   See Function Navigator for Current Functional Status.  Michel Santee 10/19/2017, 9:46 AM

## 2017-10-19 NOTE — Progress Notes (Signed)
Occupational Therapy Discharge Summary  Patient Details  Name: Barbara [name] MRN: [id number] Date of Birth: January 15, 1941  Patient has met 9 of 9 long term goals due to improved activity tolerance, improved balance and postural control.  Pt made steady progress with BADLs during this admission.  Pt is at supervision level for bathing/dressing, functional transfers, and toileting tasks.  Pt's daughter has been present for therapy.  Pt to discharge to daughter's home. Patient to discharge at overall Supervision level.  Patient's care partner is independent to provide the necessary physical assistance at discharge.     Recommendation:  Patient will benefit from ongoing skilled OT services in home health setting to continue to advance functional skills in the area of BADL, iADL and Reduce care partner burden.  Equipment: BSC and shower seat  Reasons for discharge: treatment goals met and discharge from hospital  Patient/family agrees with progress made and goals achieved: Yes  OT Discharge  Vision Baseline Vision/History: Wears glasses Wears Glasses: Reading only Patient Visual Report: No change from baseline Vision Assessment?: No apparent visual deficits Perception  Perception: Within Functional Limits Praxis Praxis: Intact Cognition Overall Cognitive Status: Impaired/Different from baseline Arousal/Alertness: Awake/alert Orientation Level: Oriented X4 Attention: Selective Sustained Attention: Appears intact Sustained Attention Impairment: Verbal basic;Functional basic Selective Attention: Impaired Memory: Impaired Memory Impairment: Decreased recall of new information;Decreased short term memory;Retrieval deficit Decreased Short Term Memory: Verbal basic Awareness: Impaired Awareness Impairment: Emergent impairment Problem Solving: Impaired Problem Solving Impairment: Functional complex;Verbal complex Safety/Judgment: Appears intact Sensation Sensation Light Touch: Appears  Intact Stereognosis: Appears Intact Hot/Cold: Appears Intact Proprioception: Appears Intact Motor  Motor Motor - Discharge Observations: ongoing deconditioning, but improving from evaluation; limited by pain   Trunk/Postural Assessment  Cervical Assessment Cervical Assessment: (long history of neck pain) Thoracic Assessment Thoracic Assessment: Exceptions to WFL(rounded shoulders) Lumbar Assessment Lumbar Assessment: Within Functional Limits Postural Control Postural Control: Within Functional Limits  Balance Static Sitting Balance Static Sitting - Level of Assistance: 6: Modified independent (Device/Increase time) Dynamic Sitting Balance Dynamic Sitting - Level of Assistance: 5: Stand by assistance Extremity/Trunk Assessment RUE Assessment RUE Assessment: Within Functional Limits LUE Assessment LUE Assessment: Within Functional Limits   See Function Navigator for Current Functional Status.  [name] [facility] 10/19/2017, 2:33 PM

## 2017-10-19 NOTE — Progress Notes (Signed)
    CC:  Post op visit to discuss home Wound Vac vs wet to dry dressing  Subjective: She looks good, still trying to decide on wet to dry vs Wound vac at home.  She had said she did not want it at home before going to CIR.    Objective: Vital signs in last 24 hours: Temp:  [98.7 F (37.1 C)-100.7 F (38.2 C)] 99.1 F (37.3 C) (05/29 0544) Pulse Rate:  [70-79] 73 (05/29 0544) Resp:  [17-19] 17 (05/29 0544) BP: (122-147)/(40-45) 134/40 (05/29 0544) SpO2:  [87 %-95 %] 87 % (05/29 0544) Weight:  [61.9 kg (136 lb 7.4 oz)] 61.9 kg (136 lb 7.4 oz) (05/29 0544) Last BM Date: 10/17/17  Intake/Output from previous day: 05/28 0701 - 05/29 0700 In: 340 [P.O.:340] Out: 200 [Urine:200] Intake/Output this shift: Total I/O In: 240 [P.O.:240] Out: -   General appearance: alert, cooperative and no distress GI: soft, non-tender; bowel sounds normal; no masses,  no organomegaly and wound vac on, ostomy working well.   Lab Results:  Recent Labs    10/18/17 2117  WBC 10.8*  HGB 9.9*  HCT 30.9*  PLT 266    BMET No results for input(s): NA, K, CL, CO2, GLUCOSE, BUN, CREATININE, CALCIUM in the last 72 hours. PT/INR No results for input(s): LABPROT, INR in the last 72 hours.  No results for input(s): AST, ALT, ALKPHOS, BILITOT, PROT, ALBUMIN in the last 168 hours.   Lipase     Component Value Date/Time   LIPASE 19 10/01/2017 1135     Medications: . aspirin  81 mg Oral Daily  . brimonidine  1 drop Both Eyes TID  . flecainide  100 mg Oral BID  . heparin injection (subcutaneous)  5,000 Units Subcutaneous Q8H  . insulin aspart  0-9 Units Subcutaneous TID WC  . insulin glargine  14 Units Subcutaneous QHS  . metoprolol tartrate  12.5 mg Oral BID  . pantoprazole  40 mg Oral BID  . polyethylene glycol  17 g Oral Daily  . protein supplement shake  11 oz Oral TID BM    Assessment/Plan Septic shock secondary to perforated diverticulitis S/p ex lap with Left/sigmoid colectomy with  transverse end colostomy, 09-29-17 by Dr. Redmond Pulling -POD 20 -path shows diverticulitis -cont wound VACchanges MWF -No evidence of abscess on CT5/16  SVT, s/p cardioversion  AKI - resolved Hypotension - resolved Respiratory Failure, VDRF/Tobacco abuse - resolved   CVA HTN hypoerlipidemia DM Chronic back pain  FEN -Carb mod VTE - SCDs/heparin ID - [place] 5/7>> 5/17  Plan:  Take the wound vac down this afternoon.  Let her have the wet to dry dressing this afternoon and this PM, let the family see how to do the dressing and see which she would prefer to use at home.  I will check on her in the AM.         LOS: 9 days    Tomma Ehinger 10/19/2017 [phone]

## 2017-10-19 NOTE — Progress Notes (Signed)
Occupational Therapy Session Note  Patient Details  Name: Barbara [name] MRN: [id number] Date of Birth: [birth year]  Today's Date: 10/19/2017 OT Individual Time: 2297-9892 OT Individual Time Calculation (min): 40 min    Short Term Goals: Week 1:  OT Short Term Goal 1 (Week 1): Pt will be able to tolerate standing for 2-3 minutes to be able to complete grooming in standing.  OT Short Term Goal 2 (Week 1): Pt will be able to don pants with steadying A. OT Short Term Goal 3 (Week 1): pt will be able to wash bottom with steadying A. OT Short Term Goal 4 (Week 1): Pt will be able to transfer on and off toilet with with RW with steadying A.      Skilled Therapeutic Interventions/Progress Updates:    Pt seen for ADL training this am of bathing, dressing and toileting.  Pt used RW for support to ambulate in and out of bathroom but otherwise can stand to manage clothing over hips with S.  Overall pt completed all tasks with S.  She continues to have limited standing tolerance due to back pain.  Pt resting in w/c at end of session with lunch tray.   Therapy Documentation Precautions:  Precautions Precautions: Fall Precaution Comments: pt with wound VAC Restrictions Weight Bearing Restrictions: No   Pain: Pain Assessment Pain Scale: 0-10 Pain Score: 0-No pain Pain Location: Abdomen Pain Orientation: Left;Upper ADL: ADL ADL Comments: refer to functional navigator Vision Baseline Vision/History: Wears glasses Wears Glasses: Reading only Patient Visual Report: No change from baseline Vision Assessment?: No apparent visual deficits Perception  Perception: Within Functional Limits Praxis Praxis: Intact  See Function Navigator for Current Functional Status.   Therapy/Group: Individual Therapy  [facility] 10/19/2017, 12:42 PM

## 2017-10-19 NOTE — Progress Notes (Signed)
Occupational Therapy Note  Patient Details  Name: BILLIE TRAGER MRN: [id number] Date of Birth: Feb 26, 1941  Today's Date: 10/19/2017 OT Individual Time: 1345-1425 OT Individual Time Calculation (min): 40 min   Pt denies pain this afternoon Individual Therapy  OT intervention with focus on dynamic standing balance tasks/activities, continued discharge planning, and BUE therex to increase independence with BADLs. Pt amb with RW for support in room for simple home mgmt tasks.  Pt engaged in BUE therex with 3# bar (rowing, chest presses, curls, and shoulder presses). Pt completed simple tasks while standing at supervision level.  Pt states she is ready for discharge tomorrow and pleased with progress.   [name] [facility] 10/19/2017, 2:27 PM

## 2017-10-19 NOTE — Progress Notes (Signed)
Patient still with wound VAC in place.  Wound care nurse has been following and question if patient would need wound VAC at all while at home.  General surgery has been contacted for their decision on plan for wound VAC at home versus discontinuing

## 2017-10-19 NOTE — Discharge Summary (Signed)
NAME: Barbara [name], Barbara [name] MEDICAL RECORD SK:[id number] ACCOUNT [id number] DATE OF BIRTH:07-16-1940 FACILITY: [facility] LOCATION: [facility] PHYSICIAN:ZACHARY Naaman Plummer, MD  DISCHARGE SUMMARY  DATE OF DISCHARGE:  10/20/2017  DATE OF ADMISSION:  10/10/2017  DATE OF DISCHARGE:  10/20/2017  DISCHARGE DIAGNOSES: 1.  Debility secondary to peritonitis with sepsis, status post left and sigmoid colectomy with colostomy. 2.  Subcutaneous heparin for deep venous thrombosis prophylaxis, pain management, paroxysmal atrial fibrillation, diabetes mellitus, decreased nutritional storage, history of gastrointestinal bleed.  HISTORY OF PRESENT ILLNESS:  This is a 77 year old right-handed female with history of diabetes mellitus, PAF, GI bleed, chronic low back pain with radiculopathy and recent admission for diverticulitis with abscess.  Discharged to home on 09/25/2017.   She lives with her daughter.  Reported to be independent prior to admission, is still driving.  HOSPITAL COURSE:  Presented to [facility] 09/26/2017 with increasing abdominal pain as well as nausea, vomiting, diarrhea.  Developed SVT, required synchronized cardioversion but developed hypotension.  She was initially started on amiodarone  and transferred to [facility].  Supportive management recommended by EP and maintained on Tambocor.  Due to sepsis with diffuse peritonitis, surgical intervention recommended and underwent exploratory laparotomy with left and sigmoid colectomy,  colostomy by Dr. Redmond Pulling.  Tolerated extubation but needed to be reintubated 10/02/2017 due to increasing shortness of breath, lethargy and delirium.  Postoperative course significant for hypotension, ileus, electrolyte abnormalities.  She was later  extubated without difficulty on 10/04/2017.  Followup CT of the abdomen and pelvis completed showing no recurrent abscess identified.  No obstructive changes.  Subcutaneous heparin for DVT prophylaxis.  Physical and  occupational therapy evaluations  completed.  The patient was admitted for a comprehensive rehab program.  PAST MEDICAL HISTORY:  See discharge diagnoses.  SOCIAL HISTORY:  Lives with daughter.  Reported to be independent prior to admission.  FUNCTIONAL STATUS:  Upon admission to rehab services was moderate assist sit to stand; minimal assist stand-pivot transfers; min-mod assist activities of daily living.  PHYSICAL EXAMINATION: VITAL SIGNS:  Blood pressure 143/48, pulse 74, temperature 97, respirations 16. GENERAL:  Alert female.  Mood was flat, but appropriate.  EOMs intact. NECK:  Supple, nontender, no JVD. CARDIOVASCULAR:  Rate controlled. ABDOMEN:  Soft, nontender, good bowel sounds, exhibiting no distention.  Colostomy in place.  REHABILITATION HOSPITAL COURSE:  The patient was admitted to inpatient rehab services.  Therapies initiated on a 3-hour daily basis, consisting of physical therapy, occupational therapy and rehabilitation nursing.  The following issues were addressed  during patient's rehabilitation stay:  Pertaining to the patient's debility related to peritonitis, she had undergone sigmoid colectomy with colostomy with full teaching completed.  Wound VAC was discontinued abdominal wound healing nicely changed to wet-to-dry dressings twice daily as needed.  She would follow up with surgery, Dr. Redmond Pulling.  Subcutaneous heparin for  DVT prophylaxis.  No bleeding episodes.  Pain management with the use of oxycodone and Robaxin as needed.  History of noted PAF during hospital course, doing well with Tambocor.  She would follow cardiology services as directed.  No chest pain or  increasing shortness of breath.  Blood sugars are acceptable.  Hemoglobin A1c of 6.4.  Lantus insulin adjusted.  Noted history of GI bleed.  She was doing well with the addition of Protonix.  Hemoglobin remained stable.  Noted decreased nutritional  storage.  Placed on nutritional supplements.  Dietary followup.   Initial attempts at Megace to stimulate appetite; however, family refused this medication.  The  patient received weekly collaborative interdisciplinary team conferences to discuss estimated  length of stay, family teaching any barriers to discharge.  Working with energy conservation techniques.  She can negotiate stairs with handrail and supervision.  Educated the patient on the use of single handrails.  Initiates ambulation training with  rolling walker.  Ambulates up to 200 feet with supervision, again working with energy conservation techniques.  Activities of daily living and homemaking.  Perform bed mobility at supervision level.  Working with functional activity tolerance.  She can  put on her shirt and pants with supervision.  Requiring no rest breaks.  Completed standing level grooming task at the sink.  Ongoing assistance education provided for her colostomy with patient and family.  Plan was discharge to home.  DISCHARGE MEDICATIONS:  Included aspirin 81 mg p.o. daily, Alphagan ophthalmic solution 0.2% 1 drop both eyes 3 times daily, Tambocor 100 mg p.o. b.i.d., Lantus insulin 14 units at bedtime, Lopressor 12.5 mg p.o. b.i.d., Protonix 40 mg p.o. b.i.d.,  MiraLax daily, protein supplement 3 times daily as advised, Robaxin 500 mg p.o. every 8 hours as needed for muscle spasms, oxycodone 5-10 mg every 4 hours as needed for pain.  DIET:  Carb-modified diet.  FOLLOWUP:  She would follow up with Dr. Alger Simons at the outpatient rehab service office only as needed.  Dr. Greer Pickerel, call for appointment.  Dr. Virl Axe.  Dr. Sherren Mocha Early to address peripheral vascular disease and discuss ABIs that were done  10/07/2017.  Dr. Asencion Noble, medical management.  SPECIAL INSTRUCTIONS:  Routine colostomy care.  Wet-to-dry dressings to abdominal wound twice daily as needed  LN/NUANCE D:10/19/2017 T:10/19/2017 JOB:[id number]

## 2017-10-19 NOTE — Progress Notes (Signed)
Occupational Therapy Session Note  Patient Details  Name: Barbara [name] MRN: [id number] Date of Birth: Mar 04, 1941  Today's Date: 10/19/2017 OT Individual Time: 1300-1330 OT Individual Time Calculation (min): 30 min    Short Term Goals: Week 1:  OT Short Term Goal 1 (Week 1): Pt will be able to tolerate standing for 2-3 minutes to be able to complete grooming in standing.  OT Short Term Goal 2 (Week 1): Pt will be able to don pants with steadying A. OT Short Term Goal 3 (Week 1): pt will be able to wash bottom with steadying A. OT Short Term Goal 4 (Week 1): Pt will be able to transfer on and off toilet with with RW with steadying A.  Skilled Therapeutic Interventions/Progress Updates:    Pt seen for OT session focusing on ADL re-training, functional activity tolerance and functional balance. Pt sitting up in w/c upon arrival finishing lunch and agreeable to tx session.  She ambulated throughout room with RW and supervision, assist from therapist for management of wound vac. Pt with 1 LOB episode when ambulating into bathroom over threshold.  She completed grooming tasks standing at sink, VCs for RW management in functional context, required cues for this throughout session, without independent carryover of technique. She required seated rest breaks following standing tasks, reporting increased back pain and spasms with prolonged stand. Per pt request, RN made aware of discomfort and pain meds administering during session. Pt's PA visited during session, discussed home with wound vac vs daily dressing change. Education with therapist provided regarding functional implications and pros/cons of each- staying within OT scope of practice.  Pt requested to remain sitting in w/c at end of session, all needs in reach and RN present.   Therapy Documentation Precautions:  Precautions Precautions: Fall Precaution Comments: pt with wound VAC Restrictions Weight Bearing Restrictions: No Pain: Pain  Assessment Pain Scale: 0-10 Pain Score: 9  Pain Type: Surgical pain Pain Location: Abdomen Pain Orientation: Left;Upper Pain Descriptors / Indicators: Aching Pain Frequency: Constant Pain Onset: On-going Patients Stated Pain Goal: 4 Pain Intervention(s): Medication (See eMAR) ,RN aware, repositioned ADL: ADL ADL Comments: refer to functional navigator    See Function Navigator for Current Functional Status.   Therapy/Group: Individual Therapy  [name] L 10/19/2017, 7:18 AM

## 2017-10-20 LAB — GLUCOSE, CAPILLARY: GLUCOSE-CAPILLARY: 80 mg/dL (ref 65–99)

## 2017-10-20 LAB — URINE CULTURE

## 2017-10-20 MED ORDER — FLECAINIDE ACETATE 100 MG PO TABS: 100.0000 mg | ORAL_TABLET | Freq: Two times a day (BID) | ORAL | 3 refills | Status: DC

## 2017-10-20 MED ORDER — OXYCODONE HCL 5 MG PO TABS: 5.0000 mg | ORAL_TABLET | ORAL | 0 refills | Status: DC | PRN

## 2017-10-20 MED ORDER — PANTOPRAZOLE SODIUM 40 MG PO TBEC: 40.0000 mg | DELAYED_RELEASE_TABLET | Freq: Two times a day (BID) | ORAL | 1 refills | Status: DC

## 2017-10-20 MED ORDER — INSULIN GLARGINE 100 UNIT/ML SOLOSTAR PEN: 14.0000 [IU] | PEN_INJECTOR | Freq: Every day | SUBCUTANEOUS | 11 refills | Status: AC

## 2017-10-20 MED ORDER — NITROGLYCERIN 0.4 MG SL SUBL: 0.4000 mg | SUBLINGUAL_TABLET | SUBLINGUAL | 0 refills | Status: DC | PRN

## 2017-10-20 MED ORDER — POLYETHYLENE GLYCOL 3350 17 G PO PACK: 17.0000 g | PACK | Freq: Every day | ORAL | 0 refills | Status: DC

## 2017-10-20 MED ORDER — ASPIRIN 81 MG PO CHEW: 81.0000 mg | CHEWABLE_TABLET | Freq: Every day | ORAL | Status: DC

## 2017-10-20 MED ORDER — METHOCARBAMOL 500 MG PO TABS: 500.0000 mg | ORAL_TABLET | Freq: Three times a day (TID) | ORAL | 0 refills | Status: DC | PRN

## 2017-10-20 MED ORDER — METOPROLOL TARTRATE 25 MG PO TABS: 12.5000 mg | ORAL_TABLET | Freq: Two times a day (BID) | ORAL | 0 refills | Status: AC

## 2017-10-20 NOTE — Consult Note (Signed)
[place] Nurse wound follow up Surgical team is following for assessment and plan of care to abd wound.  Surgical PA saw patient earlier to assess wound and discontinued Vac dressing.  Moist gauze dressing has been applied by the bedside nurse, who performed teaching to patient and daughter for dressing changes. Abd wound was not assessed today.  [place] Nurse ostomy follow up Stoma type/location:LUQ colostomy Stomal assessment/size:1 1/4 inches, slightly above the skin level, pale pink and tacky. Some mucocutaneous separation at 3 o'clock, approx .8X.2X.2cm, red and moist, applied barrier ring to assist with maintaining a seal and ordered Aquacel and discussed use for patient and daughter to use at home to promote healing. Output: Small amt brown liquid stool. Ostomy pouching: 2pc.2 1/4 inch pouching system and barrier ring applied; pt prefers to use one piece pouching system at home and supplies ordered to the room for discharge today.  Education provided:Pt  Was able to assist with pouch application and open and close the velcro to empty.  Daughter at the bedside for teaching session.  Enrolled patient in Comprehensive Outpatient Surge Discharge program:Previously. Supplies ordered to the bedside.  Reviewed pouching routines and ordering supplies and patient asked appropriate questions. She plans to have home health assistance after discharge.   Julien Girt MSN, RN, [place], [place], [place]

## 2017-10-20 NOTE — Discharge Instructions (Signed)
Inpatient Rehab Discharge Instructions  Barbara [name] Discharge date and time: No discharge date for patient encounter.   Activities/Precautions/ Functional Status: Activity: activity as tolerated Diet: diabetic diet Wound Care: keep wound clean and dry Functional status:  ___ No restrictions     ___ Walk up steps independently ___ 24/7 supervision/assistance   ___ Walk up steps with assistance ___ Intermittent supervision/assistance  ___ Bathe/dress independently ___ Walk with walker     _x__ Bathe/dress with assistance ___ Walk Independently    ___ Shower independently ___ Walk with assistance    ___ Shower with assistance ___ No alcohol     ___ Return to work/school ________    COMMUNITY REFERRALS UPON DISCHARGE:    Home Health:   PT     OT     ST    RN                      Agency:  [place] Phone: [phone]   Medical Equipment/Items Ordered: wheelchair, cushion, rolling walker                                                     Agency/Supplier: [facility] @ [phone]        Special Instructions: Routine colostomy care  Wet-to-dry dressings to abdominal wound twice daily as needed   My questions have been answered and I understand these instructions. I will adhere to these goals and the provided educational materials after my discharge from the hospital.  Patient/Caregiver Signature _______________________________ Date __________  Clinician Signature _______________________________________ Date __________  Please bring this form and your medication list with you to all your follow-up doctor's appointments.    [facility], [place] [phone]  OPEN ABDOMINAL SURGERY: POST OP INSTRUCTIONS  Always review your discharge instruction sheet given to you by the facility where your surgery was performed.  IF YOU HAVE DISABILITY OR FAMILY LEAVE FORMS, YOU MUST BRING THEM TO THE OFFICE FOR PROCESSING.  PLEASE DO NOT GIVE THEM  TO YOUR DOCTOR.  1. A prescription for pain medication may be given to you upon discharge.  Take your pain medication as prescribed, if needed.  If narcotic pain medicine is not needed, then you may take acetaminophen (Tylenol) or ibuprofen (Advil) as needed. 2. Take your usually prescribed medications unless otherwise directed. 3. If you need a refill on your pain medication, please contact your pharmacy. They will contact our office to request authorization.  Prescriptions will not be filled after 5pm or on week-ends. 4. You should follow a light diet the first few days after arrival home, such as soup and crackers, pudding, etc.unless your doctor has advised otherwise. A high-fiber, low fat diet can be resumed as tolerated.   Be sure to include lots of fluids daily. Most patients will experience some swelling and bruising on the chest and neck area.  Ice packs will help.  Swelling and bruising can take several days to resolve 5. Most patients will experience some swelling and bruising in the area of the incision. Ice pack will help. Swelling and bruising can take several days to resolve..  6. It is common to experience some constipation if taking pain medication after surgery.  Increasing fluid intake and taking a stool softener will usually help or  prevent this problem from occurring.  A mild laxative (Milk of Magnesia or Miralax) should be taken according to package directions if there are no bowel movements after 48 hours. 7.  You may have steri-strips (small skin tapes) in place directly over the incision.  These strips should be left on the skin for 7-10 days.  If your surgeon used skin glue on the incision, you may shower in 24 hours.  The glue will flake off over the next 2-3 weeks.  Any sutures or staples will be removed at the office during your follow-up visit. You may find that a light gauze bandage over your incision may keep your staples from being rubbed or pulled. You may shower and replace  the bandage daily. 8. ACTIVITIES:  You may resume regular (light) daily activities beginning the next day--such as daily self-care, walking, climbing stairs--gradually increasing activities as tolerated.  You may have sexual intercourse when it is comfortable.  Refrain from any heavy lifting or straining until approved by your doctor. a. You may drive when you no longer are taking prescription pain medication, you can comfortably wear a seatbelt, and you can safely maneuver your car and apply brakes b. Return to Work: ___________________________________ 56. You should see your doctor in the office for a follow-up appointment approximately two weeks after your surgery.  Make sure that you call for this appointment within a day or two after you arrive home to insure a convenient appointment time. OTHER INSTRUCTIONS:  _____________________________________________________________ _____________________________________________________________  WHEN TO CALL YOUR DOCTOR: 1. Fever over 101.0 2. Inability to urinate 3. Nausea and/or vomiting 4. Extreme swelling or bruising 5. Continued bleeding from incision. 6. Increased pain, redness, or drainage from the incision. 7. Difficulty swallowing or breathing 8. Muscle cramping or spasms. 9. Numbness or tingling in hands or feet or around lips.  The clinic staff is available to answer your questions during regular business hours.  Please dont hesitate to call and ask to speak to one of the nurses if you have concerns.  For further questions, please visit [email or web address]  Mechanical Wound Debridement You can shower with the wound open, you can get soap and water into the open wound and the ostomy site.   Wet to dry dressings to open site twice a day.  Use sterile saline to wet the gauze you pack into the wound.    Mechanical wound debridement is a treatment to remove dead tissue from a wound. This helps the wound heal. The treatment  involves cleaning the wound (irrigation) and using a pad or gauze (dressing) to remove dead tissue and debris from the wound. There are different types of mechanical wound debridement. Depending on the wound, you may need to repeat this procedure or change to another form of debridement as your wound starts to heal. Tell a health care provider about:  Any allergies you have.  All medicines you are taking, including vitamins, herbs, eye drops, creams, and over-the-counter medicines.  Any blood disorders you have.  Any medical conditions you have, including any conditions that: ? Cause a significant decrease in blood circulation to the part of the body where the wound is, such as peripheral vascular disease. ? Compromise your defense (immune) system or white blood count.  Any surgeries you have had.  Whether you are pregnant or may be pregnant. What are the risks? Generally, this is a safe procedure. However, problems may occur, including:  Infection.  Bleeding.  Damage to healthy tissue in and around your  wound.  Soreness or pain.  Failure of the wound to heal.  Scarring.  What happens before the procedure? You may be given antibiotic medicine to help prevent infection. What happens during the procedure?  Your health care provider may apply a numbing medicine (topical anesthetic) to the wound.  Your health care provider will irrigate your wound with a germ-free (sterile), salt-water (saline) solution. This removes debris, bacteria, and dead tissue.  Depending on what type of mechanical wound debridement you are having, your health care provider may do one of the following: ? Put a dressing on your wound. You may have dry gauze pad placed into the wound. Your health care provider will remove the gauze after the wound is dry. Any dead tissue and debris that has dried into the gauze will be lifted out of the wound (wet-to-dry debridement). ? Use a type of pad (monofilament fiber  debridement pad). This pad has a fluffy surface on one side that picks up dead tissue and debris from your wound. Your health care provider wets the pad and wipes it over your wound for several minutes. ? Irrigate your wound with a pressurized stream of solution such as saline or water.  Once your health care provider is finished, he or she may apply a light dressing to your wound. The procedure may vary among health care providers and hospitals. What happens after the procedure?  You may receive medicine for pain.  You will continue to receive antibiotic medicine if it was started before your procedure. This information is not intended to replace advice given to you by your health care provider. Make sure you discuss any questions you have with your health care provider. Document Released: 01/29/2015 Document Revised: 10/16/2015 Document Reviewed: 09/18/2014 Elsevier Interactive Patient Education  [employer].

## 2017-10-20 NOTE — Progress Notes (Signed)
Patient and daughter received discharge instructions from Marlowe [place], PA-C with verbal understanding. Colostomy education completed by [place] RN. Wet to dry dressing change education completed with verbal understanding. All equipment received. Patient discharged to home with daughter and belongings.

## 2017-10-20 NOTE — Progress Notes (Signed)
Social Work  Discharge Note  The overall goal for the admission was met for:   Discharge location: Yes - to d/c home with daughter who can provide 24/7 supervision/ assistance  Length of Stay: Yes - 10 days  Discharge activity level: Yes - supervision  Home/community participation: Yes  Services provided included: MD, RD, PT, OT, SLP, RN, TR, Pharmacy and [place]: Medicare  Follow-up services arranged: Home Health: RN, PT, OT, ST via [facility], DME: 16x16 lightweight w/c, cushion, rolling walker via [facility] and Patient/Family has no preference for HH/DME agencies  Comments (or additional information):  Patient/Family verbalized understanding of follow-up arrangements: Yes  Individual responsible for coordination of the follow-up plan: pt  Confirmed correct DME delivered: Barbara [name] 10/20/2017    Barbara [name]

## 2017-10-20 NOTE — Patient Care Conference (Signed)
Inpatient RehabilitationTeam Conference and Plan of Care Update Date: 10/18/2017   Time: 2:10 pm    Patient Name: Barbara [name]      Medical Record Number: [id number]  Date of Birth: [birth year] Sex: Female         Room/Bed: 4W06C/4W06C-01 Payor Info: Payor: [place] / Plan: BCBS MEDICARE / Product Type: *No Product type* /    Admitting Diagnosis: Debility  Admit Date/Time:  10/10/2017  5:12 PM Admission Comments: No comment available   Primary Diagnosis:  <principal problem not specified> Principal Problem: <principal problem not specified>  Patient Active Problem List   Diagnosis Date Noted  . Debility 10/10/2017  . Postoperative pain   . PAF (paroxysmal atrial fibrillation) ([place])   . Diabetes mellitus type 2 in nonobese (H[facility])   . History of GI bleed   . Diabetic acidosis without coma ([place])   . Wide Complex Tachycardia/H/o SVT and H/o Afib 09/29/2017  . Sepsis ([place]) 09/29/2017  . Colonic diverticular abscess 09/27/2017  . Diverticulitis of both large and small intestine with perforation and abscess without bleeding 09/24/2017  . Unspecified atrial fibrillation ([place]) 09/23/2017  . Dizziness 07/28/2015  . Orthostatic hypotension 07/28/2015  . Rectal bleeding 12/05/2013  . Melena 12/05/2013  . Personal history of colonic polyps 12/05/2013  . Rapid atrial fibrillation ([place]) 08/15/2013  . Atrial fibrillation with RVR ([place]) 08/15/2013  . Right upper quadrant pain 11/16/2011  . Abdominal pain, acute, left lower quadrant 09/01/2011  . Cerebrovascular disease 10/09/2010  . Hypertension   . Hyperlipidemia   . Diabetes mellitus ([place])   . Arteriosclerotic cardiovascular disease (ASCVD)   . Macular degeneration   . Tobacco abuse   . Asthma   . Palpitations   . Chest pain     Expected Discharge Date: Expected Discharge Date: 10/20/17  Team Members Present: Physician leading conference: Dr. Alger Simons Social Worker Present: Lennart Pall, LCSW Nurse  Present: Leonette Nutting, RN PT Present: Dwyane Dee, PT OT Present: Roanna Epley, [place], OT SLP Present: Charolett Bumpers, SLP PPS Coordinator present : Daiva Nakayama, RN, CRRN     Current Status/Progress Goal Weekly Team Focus  Medical   improving wound issues. stil with poor intake, has lost weight. refuses appetite stimulants  improve nutrition  nutrition education, supps, lab work, diabetes control   Bowel/Bladder   s/p colostomy LBM 5/26, NO stool output during shift, vioding w/o difficulties continent,   monitor colostomy; min assist  QS assess QS and PRN monitor   Swallow/Nutrition/ Hydration             ADL's   supervision overall, on room air  supervision overall, mod I grooming and UB self care  pt/family education, activity tolerance and balance   Mobility   supervision overall, mod I for transfers  supervision for gait, mod I transfers  d/c planning, grad day 5/29   Communication             Safety/Cognition/ Behavioral Observations  Min A   Min A - downgraded due to ELOS   semi-complex problem solving, selective attention, recall and emergent awareness   Pain   Pain continue rate on pain scale 8-9/10 continue Oxycodone Q 4 hrs with min relief, prn Robaxin provided but makes her to drowsy to take at times  pain 3/10  Assess QS and prn , continue to medicate and reposition for comfort   Skin   Abdominal midline incision with wound vac continue, incisional area with tenderness  and pain , no signs of infection, afebrile  remain infection/breakdown free  QS /PRN assessment , notify MD if changes in stoma or signs of infection, Wound Nurse in for dressing changes and care    Rehab Goals Patient on target to meet rehab goals: Yes *See Care Plan and progress notes for long and short-term goals.     Barriers to Discharge  Current Status/Progress Possible Resolutions Date Resolved   Physician    Medical stability        continued education and bulstering of  nutritional status      Nursing  Wound Care               PT                    OT                  SLP                SW                Discharge Planning/Teaching Needs:  Pt to d/c home with daughter who can provide 24/7 assistance.  Teaching completed with daughter.   Team Discussion:  Pt continues to lose weight;  Refusing appetite stimulant.  MD watching labs closely;  Need to increase intake of supplemental drinks.  Minimal output to colostomy.  Has made good gains the past few days with therapy.  ontrack for supervision goals.  Revisions to Treatment Plan:  NA    Continued Need for Acute Rehabilitation Level of Care: The patient requires daily medical management by a physician with specialized training in physical medicine and rehabilitation for the following conditions: Daily direction of a multidisciplinary physical rehabilitation program to ensure safe treatment while eliciting the highest outcome that is of practical value to the patient.: Yes Daily medical management of patient stability for increased activity during participation in an intensive rehabilitation regime.: Yes Daily analysis of laboratory values and/or radiology reports with any subsequent need for medication adjustment of medical intervention for : Post surgical problems;Nutritional problems  [name] 10/19/2017, 9:59 AM

## 2017-10-20 NOTE — Progress Notes (Signed)
    CC:  Post op dressing changes  Subjective: Pt with septic shock and perforated diverticulitis.  Surgery as noted below.  Pt is finishing rehab and about to go home.  Pt has not been anxious to use wound vac at home.  We took it down yesterday and let her do wet to dry.  After discussion with her and her daughter they have decided to go home with wet to dry dressings.  Staff will teach family how to do them.  Daughter has gone thru similar issues in the past.    Objective: Vital signs in last 24 hours: Temp:  [98.3 F (36.8 C)-98.5 F (36.9 C)] 98.3 F (36.8 C) (05/30 0421) Pulse Rate:  [59-69] 60 (05/30 0421) Resp:  [18] 18 (05/30 0421) BP: (111-140)/(43-54) 111/54 (05/30 0421) SpO2:  [92 %-100 %] 95 % (05/30 0421) Weight:  [62 kg (136 lb 11 oz)] 62 kg (136 lb 11 oz) (05/30 0421) Last BM Date: 10/17/17  Intake/Output from previous day: 05/29 0701 - 05/30 0700 In: 720 [P.O.:720] Out: 100 [Drains:100] Intake/Output this shift: No intake/output data recorded.  General appearance: alert, cooperative and no distress Resp: clear to auscultation bilaterally GI: soft, non-tender; bowel sounds normal; no masses,  no organomegaly and ostomy working well, wound is clean and healing nicely.  She is a little soupy.  I have a picture below.     Lab Results:  Recent Labs    10/18/17 2117  WBC 10.8*  HGB 9.9*  HCT 30.9*  PLT 266    BMET No results for input(s): NA, K, CL, CO2, GLUCOSE, BUN, CREATININE, CALCIUM in the last 72 hours. PT/INR No results for input(s): LABPROT, INR in the last 72 hours.  No results for input(s): AST, ALT, ALKPHOS, BILITOT, PROT, ALBUMIN in the last 168 hours.   Lipase     Component Value Date/Time   LIPASE 19 10/01/2017 1135     Medications: . aspirin  81 mg Oral Daily  . brimonidine  1 drop Both Eyes TID  . flecainide  100 mg Oral BID  . heparin injection (subcutaneous)  5,000 Units Subcutaneous Q8H  . insulin aspart  0-9 Units  Subcutaneous TID WC  . insulin glargine  14 Units Subcutaneous QHS  . metoprolol tartrate  12.5 mg Oral BID  . pantoprazole  40 mg Oral BID  . polyethylene glycol  17 g Oral Daily  . 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 Redmond Pulling -POD 21 -path shows diverticulitis -cont wound VACchanges MWF -No evidence of abscess on CT5/16  SVT, s/p cardioversion  AKI - resolved Hypotension - resolved Respiratory Failure, VDRF/Tobacco abuse - resolved   CVA HTN hypoerlipidemia DM Chronic back pain  FEN -Carb mod VTE - SCDs/heparin ID - [place] 5/7>> 5/17  Plan:  We took the wound vac off yesterday and she did wet to dry and went with that all night.  She and her daughter want to go without the wound vac and do BID wet to dry dressing changes.  I told her she could shower with the wound open also. I will put info in  the AVS, and ask staff to help with teaching her dressing changes at home.            LOS: 10 days    Akul Leggette 10/20/2017 [phone]

## 2017-10-20 NOTE — Progress Notes (Signed)
[facility] PHYSICAL MEDICINE & REHABILITATION     PROGRESS NOTE    Subjective/Complaints: Had a good day with therapy yesterday. Unhappy with what was brought for breakfast. States she's trying to eat more  ROS: Patient denies fever, rash, sore throat, blurred vision, nausea, vomiting, diarrhea, cough, shortness of breath or chest pain, joint or back pain, headache, or mood change.   Objective:  Dg Chest 2 View  Result Date: 10/18/2017 CLINICAL DATA:  Fever. EXAM: CHEST - 2 VIEW COMPARISON:  Oct 04, 2017 FINDINGS: No pneumothorax. Small bilateral pleural effusions with underlying opacities, left greater than right. The cardiomediastinal silhouette is stable. Probable pulmonary venous congestion. No nodules or masses. IMPRESSION: Small bilateral effusions with underlying atelectasis. Pulmonary venous congestion. Electronically Signed   By: Dorise Bullion III M.D   On: 10/18/2017 22:16   Recent Labs    10/18/17 2117  WBC 10.8*  HGB 9.9*  HCT 30.9*  PLT 266   No results for input(s): NA, K, CL, GLUCOSE, BUN, CREATININE, CALCIUM in the last 72 hours.  Invalid input(s): CO CBG (last 3)  Recent Labs    10/19/17 1633 10/19/17 2111 10/20/17 0637  GLUCAP 259* 171* 80    Wt Readings from Last 3 Encounters:  10/20/17 62 kg (136 lb 11 oz)  10/10/17 66.3 kg (146 lb 2.6 oz)  09/24/17 62.7 kg (138 lb 4.8 oz)     Intake/Output Summary (Last 24 hours) at 10/20/2017 0856 Last data filed at 10/20/2017 0606 Gross per 24 hour  Intake 480 ml  Output 100 ml  Net 380 ml    Vital Signs: Blood pressure (!) 111/54, pulse 60, temperature 98.3 F (36.8 C), temperature source Oral, resp. rate 18, height '5\' 4"'$  (1.626 m), weight 62 kg (136 lb 11 oz), SpO2 95 %. Physical Exam:  Constitutional: No distress . Vital signs reviewed. HEENT: EOMI, oral membranes moist Neck: supple Cardiovascular: RRR without murmur. No JVD    Respiratory: CTA Bilaterally without wheezes or rales. Normal effort     GI: BS+ tender, vac/ostomy intact  Musculoskeletal:  No edema or tenderness in extremities  Neurological: She is alert.  Oriented 3 Motor: 4/5 in all 4, less proximally Skin: Skin is warm and dry.  Psychiatric: Affect more up beat    Assessment/Plan: 1. Debility after multiple medical which require 3+ hours per day of interdisciplinary therapy in a comprehensive inpatient rehab setting. Physiatrist is providing close team supervision and 24 hour management of active medical problems listed below. Physiatrist and rehab team continue to assess barriers to discharge/monitor patient progress toward functional and medical goals.  Function:  Bathing Bathing position   Position: Sitting EOB  Bathing parts Body parts bathed by patient: Right arm, Left arm, Chest, Abdomen, Front perineal area, Right upper leg, Left upper leg, Right lower leg, Left lower leg, Buttocks Body parts bathed by helper: Back  Bathing assist Assist Level: Supervision or verbal cues      Upper Body Dressing/Undressing Upper body dressing   What is the patient wearing?: Pull over shirt/dress     Pull over shirt/dress - Perfomed by patient: Thread/unthread right sleeve, Thread/unthread left sleeve, Put head through opening, Pull shirt over trunk          Upper body assist Assist Level: More than reasonable time   Set up : To obtain clothing/put away  Lower Body Dressing/Undressing Lower body dressing   What is the patient wearing?: Underwear, Pants, Ted Hose, Non-skid slipper socks Underwear - Performed by  patient: Thread/unthread right underwear leg, Thread/unthread left underwear leg, Pull underwear up/down Underwear - Performed by helper: Thread/unthread right underwear leg, Thread/unthread left underwear leg Pants- Performed by patient: Pull pants up/down, Thread/unthread right pants leg, Thread/unthread left pants leg Pants- Performed by helper: Thread/unthread left pants leg, Thread/unthread right  pants leg Non-skid slipper socks- Performed by patient: Don/doff right sock, Don/doff left sock Non-skid slipper socks- Performed by helper: Don/doff right sock, Don/doff left sock     Shoes - Performed by patient: Don/doff right shoe, Don/doff left shoe         TED Hose - Performed by helper: Don/doff right TED hose, Don/doff left TED hose  Lower body assist Assist for lower body dressing: Supervision or verbal cues      Toileting Toileting   Toileting steps completed by patient: Adjust clothing prior to toileting, Performs perineal hygiene, Adjust clothing after toileting Toileting steps completed by helper: Adjust clothing prior to toileting, Adjust clothing after toileting    Toileting assist Assist level: Supervision or verbal cues   Transfers Chair/bed transfer   Chair/bed transfer method: Stand pivot Chair/bed transfer assist level: No Help, no cues, assistive device, takes more than a reasonable amount of time Chair/bed transfer assistive device: Walker, Air cabin crew     Max distance: 300 Assist level: Supervision or verbal cues   Wheelchair   Type: Manual Max wheelchair distance: 150 Assist Level: Supervision or verbal cues  Cognition Comprehension Comprehension assist level: Follows basic conversation/direction with extra time/assistive device  Expression Expression assist level: Expresses basic needs/ideas: With extra time/assistive device  Social Interaction Social Interaction assist level: Interacts appropriately with others with medication or extra time (anti-anxiety, antidepressant).  Problem Solving Problem solving assist level: Solves basic 75 - 89% of the time/requires cueing 10 - 24% of the time  Memory Memory assist level: Recognizes or recalls 90% of the time/requires cueing < 10% of the time   Medical Problem List and Plan: 1.  Debility secondary to peritonitis/sepsis.  Status post left and sigmoid colectomy with  colostomy  -goals met  -dc home today  -needs surgery follow up, primary follow up. 2.  DVT Prophylaxis/Anticoagulation: Subcutaneous heparin.  Monitor for any bleeding episodes 3. Pain Management: Oxycodone and Robaxin as needed  -pain at times but improving. Prn meds effective. Need to pretreat before activities 4. Mood: Provide emotional support 5. Neuropsych: This patient is not fully capable of making decisions on her own behalf. 6. Skin/Wound Care: Routine skin checks  -vac dc'ed. Wound care-ed. Both daughter/pt comfortable 7. Fluids/Electrolytes/Nutrition:      Filed Weights   10/18/17 0645 10/19/17 0544 10/20/17 0421  Weight: 59.9 kg (132 lb 0.9 oz) 61.9 kg (136 lb 7.4 oz) 62 kg (136 lb 11 oz)   - she's drinking a lot of protein/nutritional supps  -prealbumin is 5, eating here and there, drinking supps, weight trending down  -have discussed with patient at length the importance of her nutrition with patient and daughter. Both aware 8.  PAF.  Cardiac rate controlled.  Tambocor 100 mg twice daily  -Heart rate  well controlled at present 9.  Diabetes mellitus.  Hemoglobin A1c 6.4.    -Lantus increased to 14 units at bedtime on 5/28  CBG (last 3)  Recent Labs    10/19/17 1633 10/19/17 2111 10/20/17 0637  GLUCAP 259* 171* 80  sugars are improving,. Will need titration of insulin once home and as appetite improves   10.  History of  GI bleed.   -protonix  -  No bleeding episodes  -hgb 9.0   LOS (Days) 10 A FACE TO FACE EVALUATION WAS PERFORMED  Meredith Staggers, MD 10/20/2017 8:56 AM

## 2017-10-21 DIAGNOSIS — H3530 Unspecified macular degeneration: Secondary | ICD-10-CM | POA: Diagnosis not present

## 2017-10-21 DIAGNOSIS — Z48815 Encounter for surgical aftercare following surgery on the digestive system: Secondary | ICD-10-CM | POA: Diagnosis not present

## 2017-10-21 DIAGNOSIS — Z794 Long term (current) use of insulin: Secondary | ICD-10-CM | POA: Diagnosis not present

## 2017-10-21 DIAGNOSIS — Z433 Encounter for attention to colostomy: Secondary | ICD-10-CM | POA: Diagnosis not present

## 2017-10-21 DIAGNOSIS — M5416 Radiculopathy, lumbar region: Secondary | ICD-10-CM | POA: Diagnosis not present

## 2017-10-21 DIAGNOSIS — I10 Essential (primary) hypertension: Secondary | ICD-10-CM | POA: Diagnosis not present

## 2017-10-21 DIAGNOSIS — I69311 Memory deficit following cerebral infarction: Secondary | ICD-10-CM | POA: Diagnosis not present

## 2017-10-21 DIAGNOSIS — E119 Type 2 diabetes mellitus without complications: Secondary | ICD-10-CM | POA: Diagnosis not present

## 2017-10-21 DIAGNOSIS — F17210 Nicotine dependence, cigarettes, uncomplicated: Secondary | ICD-10-CM | POA: Diagnosis not present

## 2017-10-21 DIAGNOSIS — Z79891 Long term (current) use of opiate analgesic: Secondary | ICD-10-CM | POA: Diagnosis not present

## 2017-10-21 DIAGNOSIS — I480 Paroxysmal atrial fibrillation: Secondary | ICD-10-CM | POA: Diagnosis not present

## 2017-10-21 DIAGNOSIS — I2510 Atherosclerotic heart disease of native coronary artery without angina pectoris: Secondary | ICD-10-CM | POA: Diagnosis not present

## 2017-10-21 DIAGNOSIS — K219 Gastro-esophageal reflux disease without esophagitis: Secondary | ICD-10-CM | POA: Diagnosis not present

## 2017-11-02 ENCOUNTER — Encounter: Payer: Self-pay | Admitting: Internal Medicine

## 2017-11-03 ENCOUNTER — Ambulatory Visit: Payer: Medicare Other | Admitting: Internal Medicine

## 2017-11-03 ENCOUNTER — Encounter: Payer: Self-pay | Admitting: Internal Medicine

## 2017-11-03 VITALS — BP 150/50 | HR 54 | Ht 63.0 in | Wt 127.0 lb

## 2017-11-03 DIAGNOSIS — I495 Sick sinus syndrome: Secondary | ICD-10-CM | POA: Diagnosis not present

## 2017-11-03 DIAGNOSIS — I639 Cerebral infarction, unspecified: Secondary | ICD-10-CM

## 2017-11-03 DIAGNOSIS — I480 Paroxysmal atrial fibrillation: Secondary | ICD-10-CM | POA: Diagnosis not present

## 2017-11-03 MED ORDER — AMIODARONE HCL 200 MG PO TABS: 200.0000 mg | ORAL_TABLET | Freq: Every day | ORAL | 3 refills | Status: DC

## 2017-11-03 MED ORDER — AMIODARONE HCL 200 MG PO TABS: ORAL_TABLET | ORAL | 0 refills | Status: DC

## 2017-11-03 MED ORDER — APIXABAN 2.5 MG PO TABS: 2.5000 mg | ORAL_TABLET | Freq: Two times a day (BID) | ORAL | Status: DC

## 2017-11-03 NOTE — Progress Notes (Signed)
Patient Care Team: Asencion Noble, MD as PCP - General (Internal Medicine) Harl Bowie Alphonse Guild, MD as Consulting Physician (Cardiology)   HPI  Barbara [name] is a 77 y.o. female Seen in follow-up for atrial fibrillation.  She had documented nonsustained atrial tachycardia but no atrial fibrillation.  30 day event recorder 11/15 demonstrated nonsustained atrial tachycardia  Holter monitoring 2017 demonstrated only nonsustained atrial tachycardia.    She had complex atrial ectopy and we elected to try her on apixoban; this was complicated by GI bleeding.  It was discontinued      Tolerating flecainide without difficulty.  Carotid Dopplers 1/18 bifurcation right-sided stenosis 50-69  Myoview 10/15   was normal.  DATE TEST EF   3/15 Echo   65-70 %   10/15 Myoview    65 % No ischemia  5/19 Echo 65-70%       Date Cr K TSH LFTs LDL  2/19 0.85 4.8   177   5/19 0.82 4.2 7.4 13    DATE PR interval QRSduration Flecainide  3/17  144 070 0  6/19 200 94 100   She continues to have frequent episodes of atrial fibrillation with rates that are "too fast to count.  They are associated with lightheadedness shortness of breath. 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  During her hospitalization she had recurrent rapid atrial fibrillation and was treated with IV amiodarone    Past Medical History:  Diagnosis Date  . Arteriosclerotic cardiovascular disease (ASCVD)    non-obstructive; negative stress nuclear and normal [facility]%  main 20% LAD, 30% circumflex, 30% RCA, hyperdynamic LV  . Asthma    Mild  . Chest pain   . Chronic back pain   . Diabetes mellitus    A1c of 7.6 in 04/2010; moderate to high dose insulin  . GERD (gastroesophageal reflux disease)   . Hyperlipidemia     total cholesterol of 271 and LDL of 187 in 07/2010  . Hypertension   . Macular degeneration   . Palpitations   . Stroke [facility]) 1993 2008   short term memory loss  . Tobacco abuse     Past Surgical History:  Procedure Laterality Date  . APPENDECTOMY    . CATARACT EXTRACTION     left  . COLECTOMY WITH COLOSTOMY CREATION/HARTMANN PROCEDURE Left 09/29/2017   Procedure: EXTENDED LEFT HEMI COLECTOMY WITH COLOSTOMY CREATION (HARTMANN PROCEDURE);  Surgeon: Greer Pickerel, MD;  Location: [facility];  Service: General;  Laterality: Left;  . COLONOSCOPY  2009  . COLONOSCOPY  03/30/2012   Procedure: COLONOSCOPY;  Surgeon: Rogene Houston, MD;  [facility];  Service: Endoscopy;  Laterality: N/A;  730  . COLONOSCOPY N/A 12/13/2013   Procedure: COLONOSCOPY;  Surgeon: Rogene Houston, MD;  [facility];  Service: Endoscopy;  Laterality: N/A;  1200  . ESOPHAGOGASTRODUODENOSCOPY  10/21/2011   Procedure: ESOPHAGOGASTRODUODENOSCOPY (EGD);  Surgeon: Rogene Houston, MD;  [facility];  Service: Endoscopy;  Laterality: N/A;  1200  . ESOPHAGOGASTRODUODENOSCOPY N/A 12/13/2013   Procedure: ESOPHAGOGASTRODUODENOSCOPY (EGD);  Surgeon: Rogene Houston, MD;  [facility];  Service: Endoscopy;  Laterality: N/A;  . LAPAROTOMY N/A 09/29/2017   Procedure: EXPLORATORY LAPAROTOMY;  Surgeon: Greer Pickerel, MD;  Location: [facility];  Service:  General;  Laterality: N/A;  . TUBAL LIGATION      Current Outpatient Medications  Medication Sig Dispense Refill  . aspirin 81 MG chewable tablet Chew 1 tablet (81 mg total) by mouth daily.    . B Complex Vitamins (VITAMIN B COMPLEX PO) Take 1 tablet by mouth daily.    . brimonidine (ALPHAGAN) 0.2 % ophthalmic solution Place 1 drop into both eyes 3 (three) times daily.   1  . Calcium Carbonate Antacid (TUMS E-X SUGAR FREE PO) Take 2 tablets by mouth every morning.    . Cholecalciferol (VITAMIN D) 1000 UNITS capsule Take 1,000 Units by mouth every morning.      . feeding supplement, ENSURE ENLIVE, (ENSURE ENLIVE) LIQD Take 237 mLs by mouth 2 (two) times daily between meals. 237 mL 12  . flecainide (TAMBOCOR) 100 MG tablet Take 1 tablet (100 mg total) by mouth 2 (two) times daily. 180 tablet 3  . Insulin Glargine (LANTUS) 100 UNIT/ML Solostar Pen Inject 14 Units into the skin daily at 10 pm. 15 mL 11  . methocarbamol (ROBAXIN) 500 MG tablet Take 1 tablet (500 mg total) by mouth every 8 (eight) hours as needed for muscle spasms. 60 tablet 0  . metoprolol tartrate (LOPRESSOR) 25 MG tablet Take 0.5 tablets (12.5 mg total) by mouth 2 (two) times daily. 60 tablet 0  . Multiple Vitamins-Minerals (PRESERVISION AREDS 2) CAPS Take 1 capsule by mouth 2 (two) times daily.    . nitroGLYCERIN (NITROSTAT) 0.4 MG SL tablet Place 1 tablet (0.4 mg total) under the tongue every 5 (five) minutes x 3 doses as needed for chest pain. 30 tablet 0  . oxyCODONE (OXY IR/ROXICODONE) 5 MG immediate release tablet Take 1-2 tablets (5-10 mg total) by mouth every 4 (four) hours as needed for moderate pain. 30 tablet 0  . pantoprazole (PROTONIX) 40 MG tablet Take 1 tablet (40 mg total) by mouth 2 (two) times daily. 60 tablet 1  . Polyethyl Glycol-Propyl Glycol (SYSTANE OP) Apply 1 drop to eye 2 (two) times daily.    . polyethylene glycol (MIRALAX / GLYCOLAX) packet Take 17 g by mouth daily. 14 each 0  . vitamin C (ASCORBIC ACID) 500 MG tablet Take 1,000 mg by mouth at bedtime.     . vitamin E 1000 UNIT capsule Take 1,000 Units by mouth at bedtime.      No current facility-administered medications for this visit.     Allergies  Allergen Reactions  . Bee Venom Anaphylaxis  . Sulfamethoxazole Nausea And Vomiting  . Penicillins Itching and Rash       . Sulfonamide Derivatives Nausea And Vomiting    Review of Systems negative except from HPI and PMH  Physical Exam BP (!) 150/50   Pulse (!) 54   Ht 5\' 3"  (1.6 m)   Wt 127 lb (57.6 kg)   SpO2 98%   BMI 22.50 kg/m  Well  developed and nourished in no acute distress HENT normal Neck supple with JVP-flat Clear Regular rate and rhythm, no murmurs or gallops Abd-colostomy on the left and a wound bandage on the right No Clubbing cyanosis edema Skin-warm and dry A & Oriented  Grossly normal sensory and motor function  ECG sinus at 58 Intervals 20/09/45  nonspecific ST-T changes  Assessment and  Plan  Atrial tachycardia-nonsustained complex atrial ectopy  Atrial fibrillation with a rapid rate-paroxysmal  Hypertension  Dyslipidemia   Sinus bradycardia   History of GI bleeding recurrent on apixoban  eval neg  Tobacco abuse  Carotid stenosis 50-69 1/18     She is undergoing diverticulitis partial colectomy, presuming this was a source of her bleeding, we will try again to get her on apixaban.  We will start off with 2.5 mg Red With a recurrent rapid atrial fibrillation, we will discontinue the flecainide and use amiodarone.  We have reviewed side effects.  We will have her come back in about 6 weeks for repeat laboratories.  We will also make an assessment at that point as to whether to try to increase her apixaban.  We spent more than 50% of our >25 min visit in face to face counseling regarding the above

## 2017-11-03 NOTE — Patient Instructions (Addendum)
Medication Instructions:  Your physician has recommended you make the following change in your medication:  -1) STOP ASPIRIN -2) STOP FLECAINIDE -3) START Eliquis 2.5 mg - Take 1 tablet (2.5 mg) by mouth twice daily -4) START Amiodarone 200 mg (loading Dose)   -Take 2 tablets (400 mg) by mouth twice daily for 2 weeks  -Take 2 tablets (400 mg) by mouth daily for 2 weeks  -Take 1 tablet (200 mg) by mouth daily  Labwork: Your physician recommends that you return for lab work in: 4 WEEKS - CMET, TSH, and CBC  Testing/Procedures: None ordered.  Follow-Up: Your physician wants you to follow-up in: 6 weeks with [name], NP   If you need a refill on your cardiac medications before your next appointment, please call your pharmacy.

## 2017-11-10 DIAGNOSIS — K56609 Unspecified intestinal obstruction, unspecified as to partial versus complete obstruction: Secondary | ICD-10-CM | POA: Diagnosis not present

## 2017-11-10 DIAGNOSIS — Z4801 Encounter for change or removal of surgical wound dressing: Secondary | ICD-10-CM | POA: Diagnosis not present

## 2017-11-10 DIAGNOSIS — Z933 Colostomy status: Secondary | ICD-10-CM | POA: Diagnosis not present

## 2017-11-10 DIAGNOSIS — S31109A Unspecified open wound of abdominal wall, unspecified quadrant without penetration into peritoneal cavity, initial encounter: Secondary | ICD-10-CM | POA: Diagnosis not present

## 2017-11-25 DIAGNOSIS — I2510 Atherosclerotic heart disease of native coronary artery without angina pectoris: Secondary | ICD-10-CM | POA: Diagnosis not present

## 2017-11-25 DIAGNOSIS — E785 Hyperlipidemia, unspecified: Secondary | ICD-10-CM | POA: Diagnosis not present

## 2017-11-25 DIAGNOSIS — E119 Type 2 diabetes mellitus without complications: Secondary | ICD-10-CM | POA: Diagnosis not present

## 2017-11-25 DIAGNOSIS — I10 Essential (primary) hypertension: Secondary | ICD-10-CM | POA: Diagnosis not present

## 2017-12-01 ENCOUNTER — Other Ambulatory Visit: Payer: Medicare Other

## 2017-12-01 DIAGNOSIS — Z933 Colostomy status: Secondary | ICD-10-CM | POA: Diagnosis not present

## 2017-12-01 DIAGNOSIS — E119 Type 2 diabetes mellitus without complications: Secondary | ICD-10-CM | POA: Diagnosis not present

## 2017-12-01 DIAGNOSIS — I480 Paroxysmal atrial fibrillation: Secondary | ICD-10-CM | POA: Diagnosis not present

## 2017-12-06 ENCOUNTER — Encounter: Payer: Self-pay | Admitting: Vascular Surgery

## 2017-12-06 ENCOUNTER — Other Ambulatory Visit: Payer: Self-pay

## 2017-12-06 ENCOUNTER — Ambulatory Visit: Payer: Medicare Other | Admitting: Vascular Surgery

## 2017-12-06 VITALS — BP 146/61 | HR 54 | Temp 98.4°F | Resp 16 | Ht 63.0 in | Wt 126.0 lb

## 2017-12-06 DIAGNOSIS — I739 Peripheral vascular disease, unspecified: Secondary | ICD-10-CM | POA: Diagnosis not present

## 2017-12-06 NOTE — Progress Notes (Signed)
[facility] [place]  Patient name: Barbara [name] MRN: [id number] DOB: [birth year] Sex: female  REASON FOR CONSULT: Evaluation of arterial insufficiency  HPI: Barbara [name] is a 77 y.o. female, who is here today for evaluation of lower extremity arterial insufficiency.  She is here today with her daughter.  She had major health event 2 months ago.  She presented with sepsis and intra-abdominal process requiring colectomy and colostomy.  She had a very prolonged recovery time and eventually was sent to rehab for deconditioning.  She reports that she has gained 3 pounds since leaving rehab.  During her hospitalization and since she is continued to have difficulty with chronic constant pain in her left anterior thigh.  She reports this is a burning sensation and is constant with no relief.  She does have a history of degenerative disc disease with chronic back pain.  She reports that even prior to her admission to the hospital she had episodes where when she first arose from a chair she felt as though her legs were locking up.  She specifically denies any rest pain in her feet and no calf claudication symptoms even prior to admission.  Past Medical History:  Diagnosis Date  . Arteriosclerotic cardiovascular disease (ASCVD)    non-obstructive; negative stress nuclear and normal [facility]-20%  main 20% LAD, 30% circumflex, 30% RCA, hyperdynamic LV  . Asthma    Mild  . Chest pain   . Chronic back pain   . Diabetes mellitus    A1c of 7.6 in 04/2010; moderate to high dose insulin  . GERD (gastroesophageal reflux disease)   . Hyperlipidemia    total cholesterol of 271 and LDL of 187 in 07/2010  . Hypertension   . Macular degeneration   . Palpitations   . Stroke [facility]) 1993 2008   short term memory loss  . Tobacco abuse     Family History  Problem Relation Age of Onset  . Heart attack  Mother   . Cancer Father   . Cancer Sister   . Heart attack Brother   . Cancer Brother   . Cancer Sister   . Cancer Sister     SOCIAL HISTORY: Social History   Socioeconomic History  . Marital status: Married    Spouse name: Not on file  . Number of children: 1  . Years of education: Not on file  . Highest education level: Not on file  Occupational History  . Occupation: retired    Comment: hairdresser  Social Needs  . Financial resource strain: Not on file  . Food insecurity:    Worry: Not on file    Inability: Not on file  . Transportation needs:    Medical: Not on file    Non-medical: Not on file  Tobacco Use  . Smoking status: Former Smoker    Packs/day: 0.50    Years: 50.00    Pack years: 25.00    Types: Cigarettes    Last attempt to quit: 10/06/2017    Years since quitting: 0.1  . Smokeless tobacco: Never Used  Substance and Sexual Activity  . Alcohol use: No  . Drug use: No  . Sexual activity: Not Currently  Lifestyle  . Physical activity:    Days per week: Not on file    Minutes per session: Not on file  . Stress: Not on file  Relationships  . Social connections:  Talks on phone: Not on file    Gets together: Not on file    Attends religious service: Not on file    Active member of club or organization: Not on file    Attends meetings of clubs or organizations: Not on file    Relationship status: Not on file  . Intimate partner violence:    Fear of current or ex partner: Not on file    Emotionally abused: Not on file    Physically abused: Not on file    Forced sexual activity: Not on file  Other Topics Concern  . Not on file  Social History Narrative  . Not on file      Current Outpatient Medications  Medication Sig Dispense Refill  . apixaban (ELIQUIS) 2.5 MG TABS tablet Take 1 tablet (2.5 mg total) by mouth 2 (two) times daily. 60 tablet   . B Complex Vitamins (VITAMIN B COMPLEX PO) Take 1 tablet by mouth daily.    . brimonidine  (ALPHAGAN) 0.2 % ophthalmic solution Place 1 drop into both eyes 3 (three) times daily.   1  . Calcium Carbonate Antacid (TUMS E-X SUGAR FREE PO) Take 2 tablets by mouth every morning.    . Cholecalciferol (VITAMIN D) 1000 UNITS capsule Take 1,000 Units by mouth every morning.     . feeding supplement, ENSURE ENLIVE, (ENSURE ENLIVE) LIQD Take 237 mLs by mouth 2 (two) times daily between meals. 237 mL 12  . Insulin Glargine (LANTUS) 100 UNIT/ML Solostar Pen Inject 14 Units into the skin daily at 10 pm. 15 mL 11  . methocarbamol (ROBAXIN) 500 MG tablet Take 1 tablet (500 mg total) by mouth every 8 (eight) hours as needed for muscle spasms. 60 tablet 0  . metoprolol tartrate (LOPRESSOR) 25 MG tablet Take 0.5 tablets (12.5 mg total) by mouth 2 (two) times daily. 60 tablet 0  . Multiple Vitamins-Minerals (PRESERVISION AREDS 2) CAPS Take 1 capsule by mouth 2 (two) times daily.    . nitroGLYCERIN (NITROSTAT) 0.4 MG SL tablet Place 1 tablet (0.4 mg total) under the tongue every 5 (five) minutes x 3 doses as needed for chest pain. 30 tablet 0  . pantoprazole (PROTONIX) 40 MG tablet Take 1 tablet (40 mg total) by mouth 2 (two) times daily. 60 tablet 1  . Polyethyl Glycol-Propyl Glycol (SYSTANE OP) Apply 1 drop to eye 2 (two) times daily.    . polyethylene glycol (MIRALAX / GLYCOLAX) packet Take 17 g by mouth daily. 14 each 0  . vitamin C (ASCORBIC ACID) 500 MG tablet Take 1,000 mg by mouth at bedtime.     . vitamin E 1000 UNIT capsule Take 1,000 Units by mouth at bedtime.      No current facility-administered medications for this visit.     REVIEW OF SYSTEMS:  [X]  denotes positive finding, [ ]  denotes negative finding Cardiac  Comments:  Chest pain or chest pressure:    Shortness of breath upon exertion:    Short of breath when lying flat:    Irregular heart rhythm: x       Vascular    Pain in calf, thigh, or hip brought on by ambulation: x   Pain in feet at night that wakes you up from your sleep:      Blood clot in your veins:    Leg swelling:  x       Pulmonary    Oxygen at home:    Productive cough:     Wheezing:  Neurologic    Sudden weakness in arms or legs:     Sudden numbness in arms or legs:     Sudden onset of difficulty speaking or slurred speech:    Temporary loss of vision in one eye:     Problems with dizziness:         Gastrointestinal    Blood in stool:     Vomited blood:         Genitourinary    Burning when urinating:     Blood in urine:        Psychiatric    Major depression:         Hematologic    Bleeding problems:    Problems with blood clotting too easily:        Skin    Rashes or ulcers:        Constitutional    Fever or chills:      PHYSICAL EXAM: Vitals:   12/06/17 1000  BP: (!) 146/61  Pulse: (!) 54  Resp: 16  Temp: 98.4 F (36.9 C)  TempSrc: Oral  SpO2: 99%  Weight: 126 lb (57.2 kg)  Height: 5\' 3"  (1.6 m)    GENERAL: The patient is a well-nourished female, in no acute distress. The vital signs are documented above. CARDIOVASCULAR: Rotted arteries without bruits bilaterally.  2+ radial and 2+ femoral pulses bilaterally.  She does have a 1-2+ right popliteal pulse and absent pulse on the left.  I do not feel pedal pulses. PULMONARY: There is good air exchange  ABDOMEN: Soft and non-tender.  Colostomy present MUSCULOSKELETAL: There are no major deformities or cyanosis. NEUROLOGIC: No focal weakness or paresthesias are detected. SKIN: There are no ulcers or rashes noted. PSYCHIATRIC: The patient has a normal affect.  DATA:  Invasive studies while at the hospital on Oct 16, 2017.  This showed significantly diminished ankle arm index bilaterally at 0.33 on the left and 0.45 on the right  MEDICAL ISSUES: I discussed the significance of this at length with the patient.  Although she did have markedly irregular arterial studies, she does not have any symptoms related to this and no tissue loss.  She has a normal left femoral  pulse and I explained that she does not have any possibility that her issue of concern regarding pain and burning on her anterior left thigh is related to arterial insufficiency.  This does sound to be related to degenerative disc disease or other neurologic event.  I have recommended that she continue her walking program as much as possible and recovery from her major surgery to months ago.  She tentatively is planned for colostomy hookup in January 2020.  We will see her following this for noninvasive studies and continued discussion regarding her arterial insufficiency.  I did explain that if she developed any cuts or sores on her feet with slow healing that she needed to notify us because this would require treatment for limb salvage.   Rosetta Posner, MD FACS [facility] Tel [phone] Pager [phone]

## 2017-12-06 NOTE — Progress Notes (Signed)
[facility] [place]  Patient name: Barbara [name] MRN: [id number] DOB: 1940-12-27 Sex: female  REASON FOR CONSULT: Evaluation of arterial insufficiency  HPI: Barbara [name] is a 77 y.o. female, who is here today for evaluation of lower extremity arterial insufficiency.  She is here today with her daughter.  She had major health event 2 months ago.  She presented with sepsis and intra-abdominal process requiring colectomy and colostomy.  She had a very prolonged recovery time and eventually was sent to rehab for deconditioning.  She reports that she has gained 3 pounds since leaving rehab.  During her hospitalization and since she is continued to have difficulty with chronic constant pain in her left anterior thigh.  She reports this is a burning sensation and is constant with no relief.  She does have a history of degenerative disc disease with chronic back pain.  She reports that even prior to her admission to the hospital she had episodes where when she first arose from a chair she felt as though her legs were locking up.  She specifically denies any rest pain in her feet and no calf claudication symptoms even prior to admission.  Past Medical History:  Diagnosis Date  . Arteriosclerotic cardiovascular disease (ASCVD)    non-obstructive; negative stress nuclear and normal echo in 08/2009 by [facility] cardiac [facility] in 03/2000-20%  main 20% LAD, 30% circumflex, 30% RCA, hyperdynamic LV  . Asthma    Mild  . Chest pain   . Chronic back pain   . Diabetes mellitus    A1c of 7.6 in 04/2010; moderate to high dose insulin  . GERD (gastroesophageal reflux disease)   . Hyperlipidemia    total cholesterol of 271 and LDL of 187 in 07/2010  . Hypertension   . Macular degeneration   . Palpitations   . Stroke [facility]) 1993 2008   short term memory loss  . Tobacco abuse     Family History  Problem Relation Age of Onset  . Heart attack  Mother   . Cancer Father   . Cancer Sister   . Heart attack Brother   . Cancer Brother   . Cancer Sister   . Cancer Sister     SOCIAL HISTORY: Social History   Socioeconomic History  . Marital status: Married    Spouse name: Not on file  . Number of children: 1  . Years of education: Not on file  . Highest education level: Not on file  Occupational History  . Occupation: retired    Comment: hairdresser  Social Needs  . Financial resource strain: Not on file  . Food insecurity:    Worry: Not on file    Inability: Not on file  . Transportation needs:    Medical: Not on file    Non-medical: Not on file  Tobacco Use  . Smoking status: Former Smoker    Packs/day: 0.50    Years: 50.00    Pack years: 25.00    Types: Cigarettes    Last attempt to quit: 10/06/2017    Years since quitting: 0.1  . Smokeless tobacco: Never Used  Substance and Sexual Activity  . Alcohol use: No  . Drug use: No  . Sexual activity: Not Currently  Lifestyle  . Physical activity:    Days per week: Not on file    Minutes per session: Not on file  . Stress: Not on file  Relationships  . Social connections:  Talks on phone: Not on file    Gets together: Not on file    Attends religious service: Not on file    Active member of club or organization: Not on file    Attends meetings of clubs or organizations: Not on file    Relationship status: Not on file  . Intimate partner violence:    Fear of current or ex partner: Not on file    Emotionally abused: Not on file    Physically abused: Not on file    Forced sexual activity: Not on file  Other Topics Concern  . Not on file  Social History Narrative  . Not on file      Current Outpatient Medications  Medication Sig Dispense Refill  . apixaban (ELIQUIS) 2.5 MG TABS tablet Take 1 tablet (2.5 mg total) by mouth 2 (two) times daily. 60 tablet   . B Complex Vitamins (VITAMIN B COMPLEX PO) Take 1 tablet by mouth daily.    . brimonidine  (ALPHAGAN) 0.2 % ophthalmic solution Place 1 drop into both eyes 3 (three) times daily.   1  . Calcium Carbonate Antacid (TUMS E-X SUGAR FREE PO) Take 2 tablets by mouth every morning.    . Cholecalciferol (VITAMIN D) 1000 UNITS capsule Take 1,000 Units by mouth every morning.     . feeding supplement, ENSURE ENLIVE, (ENSURE ENLIVE) LIQD Take 237 mLs by mouth 2 (two) times daily between meals. 237 mL 12  . Insulin Glargine (LANTUS) 100 UNIT/ML Solostar Pen Inject 14 Units into the skin daily at 10 pm. 15 mL 11  . methocarbamol (ROBAXIN) 500 MG tablet Take 1 tablet (500 mg total) by mouth every 8 (eight) hours as needed for muscle spasms. 60 tablet 0  . metoprolol tartrate (LOPRESSOR) 25 MG tablet Take 0.5 tablets (12.5 mg total) by mouth 2 (two) times daily. 60 tablet 0  . Multiple Vitamins-Minerals (PRESERVISION AREDS 2) CAPS Take 1 capsule by mouth 2 (two) times daily.    . nitroGLYCERIN (NITROSTAT) 0.4 MG SL tablet Place 1 tablet (0.4 mg total) under the tongue every 5 (five) minutes x 3 doses as needed for chest pain. 30 tablet 0  . pantoprazole (PROTONIX) 40 MG tablet Take 1 tablet (40 mg total) by mouth 2 (two) times daily. 60 tablet 1  . Polyethyl Glycol-Propyl Glycol (SYSTANE OP) Apply 1 drop to eye 2 (two) times daily.    . polyethylene glycol (MIRALAX / GLYCOLAX) packet Take 17 g by mouth daily. 14 each 0  . vitamin C (ASCORBIC ACID) 500 MG tablet Take 1,000 mg by mouth at bedtime.     . vitamin E 1000 UNIT capsule Take 1,000 Units by mouth at bedtime.      No current facility-administered medications for this visit.     REVIEW OF SYSTEMS:  [X]  denotes positive finding, [ ]  denotes negative finding Cardiac  Comments:  Chest pain or chest pressure:    Shortness of breath upon exertion:    Short of breath when lying flat:    Irregular heart rhythm: x       Vascular    Pain in calf, thigh, or hip brought on by ambulation: x   Pain in feet at night that wakes you up from your sleep:      Blood clot in your veins:    Leg swelling:  x       Pulmonary    Oxygen at home:    Productive cough:     Wheezing:  Neurologic    Sudden weakness in arms or legs:     Sudden numbness in arms or legs:     Sudden onset of difficulty speaking or slurred speech:    Temporary loss of vision in one eye:     Problems with dizziness:         Gastrointestinal    Blood in stool:     Vomited blood:         Genitourinary    Burning when urinating:     Blood in urine:        Psychiatric    Major depression:         Hematologic    Bleeding problems:    Problems with blood clotting too easily:        Skin    Rashes or ulcers:        Constitutional    Fever or chills:      PHYSICAL EXAM: Vitals:   12/06/17 1000  BP: (!) 146/61  Pulse: (!) 54  Resp: 16  Temp: 98.4 F (36.9 C)  TempSrc: Oral  SpO2: 99%  Weight: 126 lb (57.2 kg)  Height: 5\' 3"  (1.6 m)    GENERAL: The patient is a well-nourished female, in no acute distress. The vital signs are documented above. CARDIOVASCULAR: Rotted arteries without bruits bilaterally.  2+ radial and 2+ femoral pulses bilaterally.  She does have a 1-2+ right popliteal pulse and absent pulse on the left.  I do not feel pedal pulses. PULMONARY: There is good air exchange  ABDOMEN: Soft and non-tender.  Colostomy present MUSCULOSKELETAL: There are no major deformities or cyanosis. NEUROLOGIC: No focal weakness or paresthesias are detected. SKIN: There are no ulcers or rashes noted. PSYCHIATRIC: The patient has a normal affect.  DATA:  Invasive studies while at the hospital on 11-04-2017.  This showed significantly diminished ankle arm index bilaterally at 0.33 on the left and 0.45 on the right  MEDICAL ISSUES: I discussed the significance of this at length with the patient.  Although she did have markedly irregular arterial studies, she does not have any symptoms related to this and no tissue loss.  She has a normal left femoral  pulse and I explained that she does not have any possibility that her issue of concern regarding pain and burning on her anterior left thigh is related to arterial insufficiency.  This does sound to be related to degenerative disc disease or other neurologic event.  I have recommended that she continue her walking program as much as possible and recovery from her major surgery to months ago.  She tentatively is planned for colostomy hookup in January 2020.  We will see her following this for noninvasive studies and continued discussion regarding her arterial insufficiency.  I did explain that if she developed any cuts or sores on her feet with slow healing that she needed to notify us because this would require treatment for limb salvage.   Rosetta Posner, MD FACS [facility] Tel [phone] Pager [phone]

## 2017-12-22 ENCOUNTER — Ambulatory Visit: Payer: Medicare Other | Admitting: Nurse Practitioner

## 2018-01-05 DIAGNOSIS — H353231 Exudative age-related macular degeneration, bilateral, with active choroidal neovascularization: Secondary | ICD-10-CM | POA: Diagnosis not present

## 2018-01-05 DIAGNOSIS — H40003 Preglaucoma, unspecified, bilateral: Secondary | ICD-10-CM | POA: Diagnosis not present

## 2018-01-05 DIAGNOSIS — Z961 Presence of intraocular lens: Secondary | ICD-10-CM | POA: Diagnosis not present

## 2018-01-22 DIAGNOSIS — E1165 Type 2 diabetes mellitus with hyperglycemia: Secondary | ICD-10-CM | POA: Diagnosis not present

## 2018-01-24 ENCOUNTER — Ambulatory Visit (INDEPENDENT_AMBULATORY_CARE_PROVIDER_SITE_OTHER): Payer: Medicare Other | Admitting: Internal Medicine

## 2018-02-05 DIAGNOSIS — K56609 Unspecified intestinal obstruction, unspecified as to partial versus complete obstruction: Secondary | ICD-10-CM | POA: Diagnosis not present

## 2018-02-05 DIAGNOSIS — S31109A Unspecified open wound of abdominal wall, unspecified quadrant without penetration into peritoneal cavity, initial encounter: Secondary | ICD-10-CM | POA: Diagnosis not present

## 2018-02-05 DIAGNOSIS — Z933 Colostomy status: Secondary | ICD-10-CM | POA: Diagnosis not present

## 2018-02-05 DIAGNOSIS — Z4801 Encounter for change or removal of surgical wound dressing: Secondary | ICD-10-CM | POA: Diagnosis not present

## 2018-02-19 DIAGNOSIS — E1165 Type 2 diabetes mellitus with hyperglycemia: Secondary | ICD-10-CM | POA: Diagnosis not present

## 2018-02-23 DIAGNOSIS — H353231 Exudative age-related macular degeneration, bilateral, with active choroidal neovascularization: Secondary | ICD-10-CM | POA: Diagnosis not present

## 2018-03-02 DIAGNOSIS — Z4801 Encounter for change or removal of surgical wound dressing: Secondary | ICD-10-CM | POA: Diagnosis not present

## 2018-03-02 DIAGNOSIS — S31109A Unspecified open wound of abdominal wall, unspecified quadrant without penetration into peritoneal cavity, initial encounter: Secondary | ICD-10-CM | POA: Diagnosis not present

## 2018-03-02 DIAGNOSIS — K56609 Unspecified intestinal obstruction, unspecified as to partial versus complete obstruction: Secondary | ICD-10-CM | POA: Diagnosis not present

## 2018-03-02 DIAGNOSIS — Z933 Colostomy status: Secondary | ICD-10-CM | POA: Diagnosis not present

## 2018-03-03 DIAGNOSIS — E119 Type 2 diabetes mellitus without complications: Secondary | ICD-10-CM | POA: Diagnosis not present

## 2018-03-03 DIAGNOSIS — Z79899 Other long term (current) drug therapy: Secondary | ICD-10-CM | POA: Diagnosis not present

## 2018-03-03 DIAGNOSIS — I480 Paroxysmal atrial fibrillation: Secondary | ICD-10-CM | POA: Diagnosis not present

## 2018-03-09 ENCOUNTER — Ambulatory Visit (HOSPITAL_COMMUNITY)
Admission: RE | Admit: 2018-03-09 | Discharge: 2018-03-09 | Disposition: A | Discharge status: Home / Self Care | Payer: Medicare Other | Source: Ambulatory Visit | Attending: Internal Medicine | Admitting: Internal Medicine

## 2018-03-09 ENCOUNTER — Other Ambulatory Visit (HOSPITAL_COMMUNITY): Payer: Self-pay | Admitting: Internal Medicine

## 2018-03-09 DIAGNOSIS — L659 Nonscarring hair loss, unspecified: Secondary | ICD-10-CM | POA: Diagnosis not present

## 2018-03-09 DIAGNOSIS — M4184 Other forms of scoliosis, thoracic region: Secondary | ICD-10-CM | POA: Insufficient documentation

## 2018-03-09 DIAGNOSIS — M546 Pain in thoracic spine: Secondary | ICD-10-CM

## 2018-03-09 DIAGNOSIS — E1139 Type 2 diabetes mellitus with other diabetic ophthalmic complication: Secondary | ICD-10-CM | POA: Diagnosis not present

## 2018-03-09 DIAGNOSIS — M5134 Other intervertebral disc degeneration, thoracic region: Secondary | ICD-10-CM | POA: Diagnosis not present

## 2018-03-09 DIAGNOSIS — I480 Paroxysmal atrial fibrillation: Secondary | ICD-10-CM | POA: Diagnosis not present

## 2018-03-20 DIAGNOSIS — Z933 Colostomy status: Secondary | ICD-10-CM | POA: Diagnosis not present

## 2018-03-21 DIAGNOSIS — E1165 Type 2 diabetes mellitus with hyperglycemia: Secondary | ICD-10-CM | POA: Diagnosis not present

## 2018-03-22 ENCOUNTER — Telehealth: Payer: Self-pay | Admitting: *Deleted

## 2018-03-22 NOTE — Telephone Encounter (Signed)
   [facility] Pre-operative Risk Assessment    Request for surgical clearance:  1. What type of surgery is being performed? COLOSTOMY REVERSAL   2. When is this surgery scheduled?  TBD   3. What type of clearance is required (medical clearance vs. Pharmacy clearance to hold med vs. Both)?  BOTH  4. Are there any medications that need to be held prior to surgery and how long? ELIQUIS    5. Practice name and name of physician performing surgery?  [facility] DR[facility] THOMAS   6. What is your office phone number [phone]    7.   What is your office fax number [phone]  8.   Anesthesia type (None, local, MAC, general) ? GENERAL   Barbara [name] 03/22/2018, 9:40 AM  _________________________________________________________________   (provider comments below)

## 2018-03-23 NOTE — Telephone Encounter (Signed)
   Primary Cardiologist: Virl Axe, MD  Chart reviewed as part of pre-operative protocol coverage. Patient doing well on cardiac stand point. Getting > 4 mets of activity. She has stopped Eliquis few months ago due to recurrent GI bleed. Currently takes 1/2 tablet of 325mg  ASA. Will forward to Dr. Caryl Comes for final clearance. Please forward your response to P CV DIV PREOP.   Thank you  Leanor Kail, PA 03/23/2018, 3:00 PM

## 2018-03-23 NOTE — Telephone Encounter (Signed)
   Primary Cardiologist: Virl Axe, MD  Chart reviewed as part of pre-operative protocol coverage. Left voice mail to call between pre-op hours. Pharmacy to review anticoagulation.   [place], [place] 03/23/2018, 2:22 PM

## 2018-03-24 NOTE — Telephone Encounter (Signed)
Patient with diagnosis of atrial fibrillation on Eliquis for anticoagulation.    Procedure: colostomy reverasal Date of procedure: TBD  CHADS2-VASc score of  6 (, HTN, AGE, DM2,, CAD, AGE, female)  CrCl 55.6 Platelet count 266  Per office protocol, patient can hold Eliquis for 3 days prior to procedure.

## 2018-03-30 NOTE — Telephone Encounter (Signed)
I spoke with pt and told her we were waiting for Dr. Caryl Comes to review when he returned to office next week

## 2018-03-30 NOTE — Telephone Encounter (Signed)
Follow up:   Patient calling about her clearance, patient stated that [facility] [place] [facility] have not received  paper work. Please call patient.

## 2018-03-30 NOTE — Telephone Encounter (Signed)
Pending final input from Dr. Caryl Comes as below per [facility] request. Dr. Caryl Comes, - Please route response to P CV DIV PREOP (the pre-op pool). Thank you.

## 2018-03-31 NOTE — Telephone Encounter (Signed)
Pt has been scheduled to see Robbie Lis, PA-C, 04/04/18 @ 11:30 with the understanding to arrive 15 mins early for registration.

## 2018-03-31 NOTE — Telephone Encounter (Signed)
   Primary Cardiologist:Steven Caryl Comes, MD  Chart reviewed as part of pre-operative protocol coverage. Because of Barbara [name]'s past medical history and time since last visit, she will require a follow-up visit in order to better assess preoperative cardiovascular risk.  I reviewed chart. At last visit 10/2017, Dr. Caryl Comes began loading program with amiodarone and started her back on Eliquis with plan for very close 6 week follow-up with [name] which patient cancelled (notes say due to going out of town). This was not rescheduled. [name]'s note below indicates she may now be off Eliquis again. Would suggest office visit to ensure HR is optimized as previously recommended before she undergoes general anesthesia.  Ideally this appointment would be with EP but if unable to accommodate, would arrange with APP.   Pre-op covering staff: - Please schedule appointment and call patient to inform them. - Please contact requesting surgeon's office via preferred method (i.e, phone, fax) to inform them of need for appointment prior to surgery.   Charlie Pitter, PA-C  03/31/2018, 12:46 PM

## 2018-04-04 ENCOUNTER — Encounter: Payer: Self-pay | Admitting: Physician Assistant

## 2018-04-04 ENCOUNTER — Ambulatory Visit: Payer: Medicare Other | Admitting: Physician Assistant

## 2018-04-04 VITALS — BP 130/60 | HR 60 | Ht 63.0 in | Wt 133.1 lb

## 2018-04-04 DIAGNOSIS — Z01810 Encounter for preprocedural cardiovascular examination: Secondary | ICD-10-CM | POA: Diagnosis not present

## 2018-04-04 DIAGNOSIS — I480 Paroxysmal atrial fibrillation: Secondary | ICD-10-CM | POA: Diagnosis not present

## 2018-04-04 DIAGNOSIS — I495 Sick sinus syndrome: Secondary | ICD-10-CM

## 2018-04-04 NOTE — Patient Instructions (Signed)
Medication Instructions:  Your physician recommends that you continue on your current medications as directed. Please refer to the Current Medication list given to you today.  If you need a refill on your cardiac medications before your next appointment, please call your pharmacy.   Lab work: None ordered  If you have labs (blood work) drawn today and your tests are completely normal, you will receive your results only by: Marland Kitchen MyChart Message (if you have MyChart) OR . A paper copy in the mail If you have any lab test that is abnormal or we need to change your treatment, we will call you to review the results.  Testing/Procedures: None ordered  Follow-Up: . Your physician recommends that you schedule a follow-up appointment in: [place]  Any Other Special Instructions Will Be Listed Below (If Applicable).

## 2018-04-04 NOTE — Progress Notes (Signed)
Cardiology Office Note    Date:  04/04/2018   ID:  Barbara [name], Barbara [name] Oct 12, 1940, MRN [id number]  PCP:  Asencion Noble, MD  Cardiologist: Dr. Caryl Comes   Chief Complaint: surgical clearance for colostomy reversal   History of Present Illness:   Barbara [name] is a 77 y.o. female with hx of PAF, GIB on eliquis and prior consideration of Watchman, WCT/SVT, orthostatic hypotension, some degree of SB, HTN, DM, CVA (x2), HLD presents for surgical clearance.   Prior cath in 2001 showed mild non obstructive CAD. Myoview in 2015 was low risk. Echo in 2015 showed normal LVF, mild LVH, normal LA. 30 day event recorder 11/15 demonstrated nonsustained atrial tachycardia  Holter monitoring 2017 demonstrated only nonsustained atrial tachycardia.   She had complex atrial ectopy and elected to try her on apixoban; this was complicated by GI bleeding.  It was discontinued. Tolerating flecainide without difficulty.  Ad[facility] to [facility] 09/27/17 with abd pain, found with perforated diverticulitis w/abscess, 09/28/17 morning the patient developed WCT 200bpm. She did not lose pulse, did become hypotensive and due to instabilitycardioverted with a single shock 200J and started on amiodarone, recommended transfer to [facility] for EP evaluation. Seen by Dr. Lovena Le who suspected  SVT with abbarency, though unable to r/o VT.  Echo showed LVEF 65-70% post event without WMA.   Per Dr. Aquilla Hacker note 10/2017. 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  During her hospitalization 09/2017 she had recurrent rapid atrial fibrillation and was treated with IV amiodarone." During office visit, Flecainide changed to amiodarone and started on low dose eliquis 2.5mg  BID. However patient had blood in colostomy and stopped in 3 days. No bleeding since then. She never started amiodarone and continued to take  Flecainide.   Here today for surgical clearance. No bleeding on ASA 162mg  daily. No recurrent atrial fibrillation on flecainide. She is very active doing house hold and yard work. No exertional angina or dyspnea. She is not interested in any anticoagulation therapy. The patient denies nausea, vomiting, fever, chest pain, palpitations, shortness of breath, orthopnea, PND, dizziness, syncope, cough, congestion, abdominal pain, hematochezia, melena, lower extremity edema.   Past Medical History:  Diagnosis Date  . Arteriosclerotic cardiovascular disease (ASCVD)    non-obstructive; negative stress nuclear and normal echo in 08/[facility] in 03/2000-20%  main 20% LAD, 30% circumflex, 30% RCA, hyperdynamic LV  . Asthma    Mild  . Chest pain   . Chronic back pain   . Diabetes mellitus    A1c of 7.6 in 04/2010; moderate to high dose insulin  . GERD (gastroesophageal reflux disease)   . Hyperlipidemia    total cholesterol of 271 and LDL of 187 in 07/2010  . Hypertension   . Macular degeneration   . Palpitations   . Stroke [facility]) 1993 2008   short term memory loss  . Tobacco abuse     Past Surgical History:  Procedure Laterality Date  . APPENDECTOMY    . CATARACT EXTRACTION     left  . COLECTOMY WITH COLOSTOMY CREATION/HARTMANN PROCEDURE Left 09/29/2017   Procedure: EXTENDED LEFT HEMI COLECTOMY WITH COLOSTOMY CREATION (HARTMANN PROCEDURE);  Surgeon: Greer Pickerel, MD;  Location: [place];  Service: General;  Laterality: Left;  . COLONOSCOPY  2009  . COLONOSCOPY  03/30/2012   Procedure: COLONOSCOPY;  Surgeon: Mechele Dawley  Laural Golden, MD;  [facility];  Service: Endoscopy;  Laterality: N/A;  730  . COLONOSCOPY N/A 12/13/2013   Procedure: COLONOSCOPY;  Surgeon: Rogene Houston, MD;  [facility];  Service: Endoscopy;  Laterality: N/A;  1200  . ESOPHAGOGASTRODUODENOSCOPY  10/21/2011   Procedure: ESOPHAGOGASTRODUODENOSCOPY (EGD);  Surgeon: Rogene Houston,  MD;  [facility];  Service: Endoscopy;  Laterality: N/A;  1200  . ESOPHAGOGASTRODUODENOSCOPY N/A 12/13/2013   Procedure: ESOPHAGOGASTRODUODENOSCOPY (EGD);  Surgeon: Rogene Houston, MD;  [facility];  Service: Endoscopy;  Laterality: N/A;  . LAPAROTOMY N/A 09/29/2017   Procedure: EXPLORATORY LAPAROTOMY;  Surgeon: Greer Pickerel, MD;  Location: [place];  Service: General;  Laterality: N/A;  . TUBAL LIGATION      Current Medications:  Prior to Admission medications   Medication Sig Start Date End Date Taking? Authorizing Provider  B Complex Vitamins (VITAMIN B COMPLEX PO) Take 1 tablet by mouth daily.   Yes [provider]  brimonidine (ALPHAGAN) 0.2 % ophthalmic solution Place 1 drop into both eyes 3 (three) times daily.  01/31/15  Yes [provider]  Calcium Carbonate Antacid (TUMS E-X SUGAR FREE PO) Take 2 tablets by mouth every morning.   Yes [provider]  Cholecalciferol (VITAMIN D) 1000 UNITS capsule Take 1,000 Units by mouth every morning.    Yes [provider]  flecainide (TAMBOCOR) 100 MG tablet Take 100 mg by mouth 2 (two) times daily. 02/28/18  Yes [provider]  Insulin Glargine (LANTUS) 100 UNIT/ML Solostar Pen Inject 14 Units into the skin daily at 10 pm. 10/20/17  Yes Angiulli, Lavon Paganini, PA-C  methocarbamol (ROBAXIN) 500 MG tablet Take 1 tablet (500 mg total) by mouth every 8 (eight) hours as needed for muscle spasms. 10/20/17  Yes Angiulli, Lavon Paganini, PA-C  metoprolol tartrate (LOPRESSOR) 25 MG tablet Take 0.5 tablets (12.5 mg total) by mouth 2 (two) times daily. 10/20/17  Yes Angiulli, Lavon Paganini, PA-C  Multiple Vitamins-Minerals (PRESERVISION AREDS 2) CAPS Take 1 capsule by mouth 2 (two) times daily.   Yes [provider]  nitroGLYCERIN (NITROSTAT) 0.4 MG SL tablet Place 1 tablet (0.4 mg total) under the tongue every 5 (five) minutes x 3 doses as needed for chest pain. 10/20/17  Yes Angiulli, Lavon Paganini, PA-C    Polyethyl Glycol-Propyl Glycol (SYSTANE OP) Apply 1 drop to eye 2 (two) times daily.   Yes [provider]  polyethylene glycol (MIRALAX / GLYCOLAX) packet Take 17 g by mouth daily. 10/20/17  Yes Angiulli, Lavon Paganini, PA-C  vitamin C (ASCORBIC ACID) 500 MG tablet Take 1,000 mg by mouth at bedtime.    Yes [provider]   Allergies:   Bee venom; Penicillins; Sulfamethoxazole; and Sulfonamide derivatives   Social History   Socioeconomic History  . Marital status: Married    Spouse name: Not on file  . Number of children: 1  . Years of education: Not on file  . Highest education level: Not on file  Occupational History  . Occupation: retired    Comment: hairdresser  Social Needs  . Financial resource strain: Not on file  . Food insecurity:    Worry: Not on file    Inability: Not on file  . Transportation needs:    Medical: Not on file    Non-medical: Not on file  Tobacco Use  . Smoking status: Former Smoker    Packs/day: 0.50    Years: 50.00    Pack  years: 25.00    Types: Cigarettes    Last attempt to quit: 10/06/2017    Years since quitting: 0.4  . Smokeless tobacco: Never Used  Substance and Sexual Activity  . Alcohol use: No  . Drug use: No  . Sexual activity: Not Currently  Lifestyle  . Physical activity:    Days per week: Not on file    Minutes per session: Not on file  . Stress: Not on file  Relationships  . Social connections:    Talks on phone: Not on file    Gets together: Not on file    Attends religious service: Not on file    Active member of club or organization: Not on file    Attends meetings of clubs or organizations: Not on file    Relationship status: Not on file  Other Topics Concern  . Not on file  Social History Narrative  . Not on file     Family History:  The patient's family history includes Cancer in her brother, father, sister, sister, and sister; Heart attack in her brother and mother.   ROS:   Please see the history  of present illness.    ROS All other systems reviewed and are negative.   PHYSICAL EXAM:   VS:  BP 130/60   Pulse 60   Ht 5\' 3"  (1.6 m)   Wt 133 lb 1.9 oz (60.4 kg)   SpO2 92%   BMI 23.58 kg/m    GEN: Well nourished, well developed, in no acute distress  HEENT: normal  Neck: no JVD, carotid bruits, or masses Cardiac: RRR; no murmurs, rubs, or gallops,no edema  Respiratory:  clear to auscultation bilaterally, normal work of breathing GI: soft, nontender, nondistended, + BS. Colectomy  MS: no deformity or atrophy  Skin: warm and dry, no rash Neuro:  Alert and Oriented x 3, Strength and sensation are intact Psych: euthymic mood, full affect  Wt Readings from Last 3 Encounters:  04/04/18 133 lb 1.9 oz (60.4 kg)  12/06/17 126 lb (57.2 kg)  11/03/17 127 lb (57.6 kg)      Studies/Labs Reviewed:   EKG:  EKG is ordered today.  The ekg ordered today demonstrates SR at rate of 55 bpm  Recent Labs: 09/26/2017: TSH 7.417 10/06/2017: Magnesium 2.0 10/11/2017: ALT 13 10/14/2017: BUN 7; Creatinine, Ser 0.79; Potassium 3.9; Sodium 138 10/18/2017: Hemoglobin 9.9; Platelets 266   Lipid Panel    Component Value Date/Time   CHOL 231 (H) 08/16/2013 0412   TRIG 121 10/03/2017 0601   HDL 42 08/16/2013 0412   CHOLHDL 5.5 08/16/2013 0412   VLDL 31 08/16/2013 0412   LDLCALC 158 (H) 08/16/2013 0412    Additional studies/ records that were reviewed today include:   Echocardiogram: 09/2017 Study Conclusions  - Left ventricle: The cavity size was normal. Wall thickness was   increased in a pattern of mild LVH. Systolic function was   vigorous. The estimated ejection fraction was in the range of 65%   to 70%. Wall motion was normal; there were no regional wall   motion abnormalities. Left ventricular diastolic function   parameters were normal. - Aortic valve: Valve area (VTI): 1.96 cm^2. Valve area (Vmax):   1.96 cm^2. Valve area (Vmean): 1.87 cm^2. - Mitral valve: There was mild  regurgitation. - Left atrium: The atrium was moderately dilated. - Right atrium: The atrium was mildly dilated. - Atrial septum: No defect or patent foramen ovale was identified. - Pulmonary arteries: Systolic pressure  was mildly increased. - Technically adequate study.   Carotid doppler 06/2017 Final Interpretation: Right Carotid: Velocities in the right ICA are consistent with a 1-39% stenosis.  Left Carotid: Velocities in the left ICA are consistent with a 1-39% stenosis.  Vertebrals: Both vertebral arteries were patent with antegrade flow. Subclavians: Normal flow hemodynamics were seen in bilateral subclavian       arteries.    ASSESSMENT & PLAN:    1. PAF - Maintaining sinus rhythm on Flecainide. She never started outpatient amiodarone (only used during admission 09/2017). Will continue amiodarone. Continues to have recurrent GI bleed despite colostomy. Currently takes ASA 162mg  daily. CHADSVASC score of of 7. Not interested in any anticoagulation.   2. Surgical clearance  - He is easily getting > 4 mets of activity. Given past medical history and based on ACC/AHA guidelines, SABRYNA LAHM would be at acceptable risk for the planned procedure without further cardiovascular testing.  - Watch for post op arrhythmias. Reviewed with pharmacist and Dr. Caryl Comes regarding anticoagulation. She is not on blood thinner for many months. May consider after GI issue resolves during follow up.   I will route this recommendation to the requesting party via Epic fax function and remove from pre-op pool.  Please call with questions.  [place], [place] 04/04/2018, 3:41 PM       Medication Adjustments/Labs and Tests Ordered: Current medicines are reviewed at length with the patient today.  Concerns regarding medicines are outlined above.  Medication changes, Labs and Tests ordered today are listed in the Patient Instructions below. Patient Instructions  Medication  Instructions:  Your physician recommends that you continue on your current medications as directed. Please refer to the Current Medication list given to you today.  If you need a refill on your cardiac medications before your next appointment, please call your pharmacy.   Lab work: None ordered  If you have labs (blood work) drawn today and your tests are completely normal, you will receive your results only by: Marland Kitchen MyChart Message (if you have MyChart) OR . A paper copy in the mail If you have any lab test that is abnormal or we need to change your treatment, we will call you to review the results.  Testing/Procedures: None ordered  Follow-Up: . Your physician recommends that you schedule a follow-up appointment in: [place]  Any Other Special Instructions Will Be Listed Below (If Applicable).       Jarrett Soho, [place]  04/04/2018 12:27 PM    [facility] [address], [place], [place]  [address] Phone: [phone]; Fax: [phone]

## 2018-04-12 ENCOUNTER — Ambulatory Visit: Payer: Self-pay | Admitting: General Surgery

## 2018-04-13 DIAGNOSIS — H35723 Serous detachment of retinal pigment epithelium, bilateral: Secondary | ICD-10-CM | POA: Diagnosis not present

## 2018-04-13 DIAGNOSIS — H353231 Exudative age-related macular degeneration, bilateral, with active choroidal neovascularization: Secondary | ICD-10-CM | POA: Diagnosis not present

## 2018-04-14 DIAGNOSIS — E1165 Type 2 diabetes mellitus with hyperglycemia: Secondary | ICD-10-CM | POA: Diagnosis not present

## 2018-05-11 DIAGNOSIS — H353231 Exudative age-related macular degeneration, bilateral, with active choroidal neovascularization: Secondary | ICD-10-CM | POA: Diagnosis not present

## 2018-05-15 DIAGNOSIS — E1165 Type 2 diabetes mellitus with hyperglycemia: Secondary | ICD-10-CM | POA: Diagnosis not present

## 2018-05-18 NOTE — Patient Instructions (Signed)
Barbara [name]  05/18/2018   Your procedure is scheduled on: 05-26-18  Report to [facility] Main  Entrance  Report to admitting at 530 AM    Call this number if you have problems the morning of surgery [phone]   Remember: Do not eat food or drink liquids :After Midnight. BRUSH YOUR TEETH MORNING OF SURGERY AND RINSE YOUR MOUTH OUT, NO CHEWING GUM CANDY OR MINTS.  DRINK 2 PRESURGERY ENSURE DRINKS THE NIGHT BEFORE SURGERY AT  1000 PM AND 1 PRESURGERY DRINK THE DAY OF THE PROCEDURE 3 HOURS PRIOR TO SCHEDULED SURGERY. NO SOLIDS AFTER MIDNIGHT THE DAY PRIOR TO THE SURGERY. NOTHING BY MOUTH EXCEPT CLEAR LIQUIDS UNTIL THREE HOURS PRIOR TO SCHEDULED SURGERY. PLEASE FINISH PRESURGERY ENSURE DRINK PER SURGEON ORDER 3 HOURS PRIOR TO SCHEDULED SURGERY TIME WHICH NEEDS TO BE COMPLETED AT 430 AM.  FOLLOW ALL BOWEL PREP INSTRUCTIONS FROM DR Marcello Moores AND DRINK PLENTY OF CLEAR LIQUIDS WITH BOWEL PREP TO PREVENT DEHYDRATION  CLEAR LIQUID DIET   Foods Allowed                                                                     Foods Excluded  Coffee and tea, regular and decaf                             liquids that you cannot  Plain Jell-O in any flavor                                             see through such as: Fruit ices (not with fruit pulp)                                     milk, soups, orange juice  Iced Popsicles                                    All solid food Carbonated beverages, regular and diet                                    Cranberry, grape and apple juices Sports drinks like Gatorade Lightly seasoned clear broth or consume(fat free) Sugar, honey syrup  Sample Menu Breakfast                                Lunch                                     Supper Cranberry juice                    Beef broth  Chicken broth Jell-O                                     Grape juice                           Apple juice Coffee or tea                         Jell-O                                      Popsicle                                                Coffee or tea                        Coffee or tea  _____________________________________________________________________  How to Manage Your Diabetes Before and After Surgery  Why is it important to control my blood sugar before and after surgery? . Improving blood sugar levels before and after surgery helps healing and can limit problems. . A way of improving blood sugar control is eating a healthy diet by: o  Eating less sugar and carbohydrates o  Increasing activity/exercise o  Talking with your doctor about reaching your blood sugar goals . High blood sugars (greater than 180 mg/dL) can raise your risk of infections and slow your recovery, so you will need to focus on controlling your diabetes during the weeks before surgery. . Make sure that the doctor who takes care of your diabetes knows about your planned surgery including the date and location.  How do I manage my blood sugar before surgery? . Check your blood sugar at least 4 times a day, starting 2 days before surgery, to make sure that the level is not too high or low. o Check your blood sugar the morning of your surgery when you wake up and every 2 hours until you get to the Short Stay unit. . If your blood sugar is less than 70 mg/dL, you will need to treat for low blood sugar: o Do not take insulin. o Treat a low blood sugar (less than 70 mg/dL) with  cup of clear juice (cranberry or apple), 4 glucose tablets, OR glucose gel. o Recheck blood sugar in 15 minutes after treatment (to make sure it is greater than 70 mg/dL). If your blood sugar is not greater than 70 mg/dL on recheck, call [phone] for further instructions. . Report your blood sugar to the short stay nurse when you get to Short Stay.  . If you are admitted to the hospital after surgery: o Your blood sugar will be checked by the staff and you  will probably be given insulin after surgery (instead of oral diabetes medicines) to make sure you have good blood sugar levels. o The goal for blood sugar control after surgery is 80-180 mg/dL.   WHAT DO I DO ABOUT MY DIABETES MEDICATION?  Marland Kitchen Do not take oral diabetes medicines (pills) the morning of surgery.  . THE DAY  BEFORE SURGERY TAKE MORNING LANTUS AS USUAL  THE MORNING OF SURGERY TAKE 1/2 DOSE OF LANTUS INSULIN ( TAKE 9 UNITS ) .   Marland Kitchen If your CBG is greater than 220 mg/dL, you may take  of your sliding scale  . (correction) dose of insulin.     Patient Signature:  Date:   Nurse Signature:  Date:   Reviewed and Endorsed by [facility] Patient Education Committee, August 2015   Take these medicines the morning of surgery with A SIP OF WATER: FLECANIIDE, METOPROLOL TARTRATE, EYE DROPS              You may not have any metal on your body including hair pins and              piercings  Do not wear jewelry, make-up, lotions, powders or perfumes, deodorant             Do not wear nail polish.  Do not shave  48 hours prior to surgery.              Men may shave face and neck.   Do not bring valuables to the hospital. [place].  Contacts, dentures or bridgework may not be worn into surgery.  Leave suitcase in the car. After surgery it may be brought to your room.                   Please read over the following fact sheets you were given: _____________________________________________________________________   [facility] - Preparing for Surgery Before surgery, you can play an important role.  Because skin is not sterile, your skin needs to be as free of germs as possible.  You can reduce the number of germs on your skin by washing with CHG (chlorahexidine gluconate) soap before surgery.  CHG is an antiseptic cleaner which kills germs and bonds with the skin to continue killing germs even after washing. Please DO NOT  use if you have an allergy to CHG or antibacterial soaps.  If your skin becomes reddened/irritated stop using the CHG and inform your nurse when you arrive at Short Stay. Do not shave (including legs and underarms) for at least 48 hours prior to the first CHG shower.  You may shave your face/neck. Please follow these instructions carefully:  1.  Shower with CHG Soap the night before surgery and the  morning of Surgery.  2.  If you choose to wash your hair, wash your hair first as usual with your  normal  shampoo.  3.  After you shampoo, rinse your hair and body thoroughly to remove the  shampoo.                           4.  Use CHG as you would any other liquid soap.  You can apply chg directly  to the skin and wash                       Gently with a scrungie or clean washcloth.  5.  Apply the CHG Soap to your body ONLY FROM THE NECK DOWN.   Do not use on face/ open                           Wound or open sores. Avoid contact with eyes, ears mouth  and genitals (private parts).                       Wash face,  Genitals (private parts) with your normal soap.             6.  Wash thoroughly, paying special attention to the area where your surgery  will be performed.  7.  Thoroughly rinse your body with warm water from the neck down.  8.  DO NOT shower/wash with your normal soap after using and rinsing off  the CHG Soap.                9.  Pat yourself dry with a clean towel.            10.  Wear clean pajamas.            11.  Place clean sheets on your bed the night of your first shower and do not  sleep with pets. Day of Surgery : Do not apply any lotions/deodorants the morning of surgery.  Please wear clean clothes to the hospital/surgery center.  FAILURE TO FOLLOW THESE INSTRUCTIONS MAY RESULT IN THE CANCELLATION OF YOUR SURGERY PATIENT SIGNATURE_________________________________  NURSE  SIGNATURE__________________________________  ________________________________________________________________________   Adam Phenix  An incentive spirometer is a tool that can help keep your lungs clear and active. This tool measures how well you are filling your lungs with each breath. Taking long deep breaths may help reverse or decrease the chance of developing breathing (pulmonary) problems (especially infection) following:  A long period of time when you are unable to move or be active. BEFORE THE PROCEDURE   If the spirometer includes an indicator to show your best effort, your nurse or respiratory therapist will set it to a desired goal.  If possible, sit up straight or lean slightly forward. Try not to slouch.  Hold the incentive spirometer in an upright position. INSTRUCTIONS FOR USE  1. Sit on the edge of your bed if possible, or sit up as far as you can in bed or on a chair. 2. Hold the incentive spirometer in an upright position. 3. Breathe out normally. 4. Place the mouthpiece in your mouth and seal your lips tightly around it. 5. Breathe in slowly and as deeply as possible, raising the piston or the ball toward the top of the column. 6. Hold your breath for 3-5 seconds or for as long as possible. Allow the piston or ball to fall to the bottom of the column. 7. Remove the mouthpiece from your mouth and breathe out normally. 8. Rest for a few seconds and repeat Steps 1 through 7 at least 10 times every 1-2 hours when you are awake. Take your time and take a few normal breaths between deep breaths. 9. The spirometer may include an indicator to show your best effort. Use the indicator as a goal to work toward during each repetition. 10. After each set of 10 deep breaths, practice coughing to be sure your lungs are clear. If you have an incision (the cut made at the time of surgery), support your incision when coughing by placing a pillow or rolled up towels firmly  against it. Once you are able to get out of bed, walk around indoors and cough well. You may stop using the incentive spirometer when instructed by your caregiver.  RISKS AND COMPLICATIONS  Take your time so you do not get dizzy or light-headed.  If you are in pain,  you may need to take or ask for pain medication before doing incentive spirometry. It is harder to take a deep breath if you are having pain. AFTER USE  Rest and breathe slowly and easily.  It can be helpful to keep track of a log of your progress. Your caregiver can provide you with a simple table to help with this. If you are using the spirometer at home, follow these instructions: [place] IF:   You are having difficultly using the spirometer.  You have trouble using the spirometer as often as instructed.  Your pain medication is not giving enough relief while using the spirometer.  You develop fever of 100.5 F (38.1 C) or higher. SEEK IMMEDIATE MEDICAL CARE IF:   You cough up bloody sputum that had not been present before.  You develop fever of 102 F (38.9 C) or greater.  You develop worsening pain at or near the incision site. MAKE SURE YOU:   Understand these instructions.  Will watch your condition.  Will get help right away if you are not doing well or get worse. Document Released: 09/20/2006 Document Revised: 08/02/2011 Document Reviewed: 11/21/2006 [facility] Patient Information 2014 [place], [place].   ________________________________________________________________________

## 2018-05-18 NOTE — Progress Notes (Signed)
[place] 04-04-18 Epic EKG 04-04-18 Epic ECHO 09-28-17 EPIC

## 2018-05-19 ENCOUNTER — Encounter (HOSPITAL_COMMUNITY): Payer: Self-pay | Admitting: Physician Assistant

## 2018-05-19 ENCOUNTER — Encounter (HOSPITAL_COMMUNITY): Payer: Self-pay

## 2018-05-19 ENCOUNTER — Encounter (HOSPITAL_COMMUNITY)
Admission: RE | Admit: 2018-05-19 | Discharge: 2018-05-19 | Disposition: A | Discharge status: Home / Self Care | Payer: Medicare Other | Source: Ambulatory Visit | Attending: General Surgery | Admitting: General Surgery

## 2018-05-19 ENCOUNTER — Other Ambulatory Visit: Payer: Self-pay

## 2018-05-19 DIAGNOSIS — Z79899 Other long term (current) drug therapy: Secondary | ICD-10-CM | POA: Diagnosis not present

## 2018-05-19 DIAGNOSIS — Z1211 Encounter for screening for malignant neoplasm of colon: Secondary | ICD-10-CM | POA: Insufficient documentation

## 2018-05-19 DIAGNOSIS — Z794 Long term (current) use of insulin: Secondary | ICD-10-CM | POA: Diagnosis not present

## 2018-05-19 DIAGNOSIS — Z01818 Encounter for other preprocedural examination: Secondary | ICD-10-CM | POA: Insufficient documentation

## 2018-05-19 DIAGNOSIS — E119 Type 2 diabetes mellitus without complications: Secondary | ICD-10-CM | POA: Diagnosis not present

## 2018-05-19 DIAGNOSIS — Z7982 Long term (current) use of aspirin: Secondary | ICD-10-CM | POA: Insufficient documentation

## 2018-05-19 HISTORY — DX: Paroxysmal atrial fibrillation: I48.0

## 2018-05-19 HISTORY — DX: Unspecified glaucoma: H40.9

## 2018-05-19 LAB — GLUCOSE, CAPILLARY
GLUCOSE-CAPILLARY: 42 mg/dL — AB (ref 70–99)
Glucose-Capillary: 83 mg/dL (ref 70–99)

## 2018-05-19 LAB — BASIC METABOLIC PANEL
Anion gap: 10 (ref 5–15)
BUN: 14 mg/dL (ref 8–23)
CALCIUM: 9.1 mg/dL (ref 8.9–10.3)
CO2: 28 mmol/L (ref 22–32)
Chloride: 100 mmol/L (ref 98–111)
Creatinine, Ser: 0.91 mg/dL (ref 0.44–1.00)
GFR calc Af Amer: >60 mL/min (ref >60)
GFR calc non Af Amer: >60 mL/min (ref >60)
Glucose, Bld: 109 mg/dL — ABNORMAL HIGH (ref 70–99)
Potassium: 3.8 mmol/L (ref 3.5–5.1)
Sodium: 138 mmol/L (ref 135–145)

## 2018-05-19 LAB — CBC
HCT: 41.7 % (ref 36.0–46.0)
Hemoglobin: 13.1 g/dL (ref 12.0–15.0)
MCH: 30.8 pg (ref 26.0–34.0)
MCHC: 31.4 g/dL (ref 30.0–36.0)
MCV: 97.9 fL (ref 80.0–100.0)
Platelets: 233 10*3/uL (ref 150–400)
RBC: 4.26 MIL/uL (ref 3.87–5.11)
RDW: 13.2 % (ref 11.5–15.5)
WBC: 8.8 10*3/uL (ref 4.0–10.5)
nRBC: 0 % (ref 0.0–0.2)

## 2018-05-19 NOTE — Progress Notes (Signed)
Spoke with Barbara [name] at [facility], patient states is to admitted after colonscopy 05-25-18, Barbara [name] to speak with dr Marcello Moores on Monday 05-22-18 and call patient back.

## 2018-05-22 ENCOUNTER — Encounter (HOSPITAL_COMMUNITY): Payer: Self-pay

## 2018-05-22 ENCOUNTER — Other Ambulatory Visit (HOSPITAL_COMMUNITY): Payer: Self-pay | Admitting: *Deleted

## 2018-05-22 NOTE — Progress Notes (Signed)
Spoke with wendy at [place] patient is to be admitted after 05-25-18 colonscopy and wendy to check on if patient needs to stop aspirin and will call patient to instruct about aspirin

## 2018-05-22 NOTE — Anesthesia Preprocedure Evaluation (Deleted)
Anesthesia Evaluation    Airway        Dental   Pulmonary former smoker,           Cardiovascular hypertension,      Neuro/Psych    GI/Hepatic   Endo/Other  diabetes  Renal/GU      Musculoskeletal   Abdominal   Peds  Hematology   Anesthesia Other Findings   Reproductive/Obstetrics                             Anesthesia Physical Anesthesia Plan  ASA:   Anesthesia Plan:    Post-op Pain Management:    Induction:   PONV Risk Score and Plan:   Airway Management Planned:   Additional Equipment:   Intra-op Plan:   Post-operative Plan:   Informed Consent:   Plan Discussed with:   Anesthesia Plan Comments: (See PST note written 05/22/2018 by Myra Gianotti, PA-C. For procedures on 05/25/18 and 05/26/18.  )        Anesthesia Quick Evaluation

## 2018-05-22 NOTE — Progress Notes (Signed)
PCP: roy fagan   CARDIOLOGIST: none  INFO IN Epic:cbc hemaglobin 9-9  INFO ON CHART: none  BLOOD THINNERS AND LAST DOSES: none ____________________________________  PATIENT SYMPTOMS AT TIME OF PREOP: blood glucose 42, recheck after eating 83 blood glucose, due to lab not being sent out heamglobin a1c not done by lab, will need to be redone day of surgery. Order placed in epic for hemagobin 1c to be done.

## 2018-05-22 NOTE — Progress Notes (Signed)
Anesthesia Chart Review:  Case:  [id number] Date/Time:  05/25/18 1300   Procedure:  COLONOSCOPY THROUGH OSTOMY (N/A )   Anesthesia type:  Moderate Sedation   Pre-op diagnosis:  screening   Location:  [facility] / [facility]   Surgeon:  Leighton Ruff, MD    She is also scheduled for laparoscopic colostomy reversal on 2/70/3500 by Leighton Ruff, MD. (Per PST RN note, patient reported she was to be admitted after colonoscopy for colostomy reversal the next day. Abigail Butts at [place] to clarify with Dr. Marcello Moores.)   DISCUSSION: Patient is a 77 year old female scheduled for the above procedures. She is s/p exploratory laparotomy, left colectomy and sigmoid colectomy with end transverse colon colostomy on 09/29/17 for perforated sigmoid diverticulitis with intraabdominal abscess. (See below for hospital course.)    Other history includes former smoker (quit 10/06/17), HTN, HLD, GERD, DM2, glaucoma, CVA ('93, '08; short term memory loss), asthma, palpitations, PAF (afib with RVR, atrial tachycardia 2015, 2017; WCT/SVT with aberrancy requiring synchronized cardioversion 09/28/17), mild CAD (2001). She had vascular surgery evaluation 11/2017 for arterial insufficiency (ABI 0.33 L, 0.45 R) with plans for follow-up in 2020 for further evaluation, but at that time was not having symptoms or tissue loss.  - Transfer admission from [facility] to [facility] 09/28/17-10/10/17  for EP evaluation and further management of diverticular abscess. EP Dr. Lovena Le felt WCT likely SVT with aberrancy. Mild troponin elevation felt likely due to shock. Echo showed normal LVEF without wall motion abnormality. He felt patient could proceed with surgery. She underwent exploratory laparotomy, left colectomy and sigmoid colectomy with end transverse colon colostomy on 09/29/17. Post-operative course was complicated by acute metabolic encephalopathy, AKI, DKA, worsening hypoxic respiratory failure requiring re-intubation 10/01/17. Extubated 10/04/17. Sepsis improved  after surgery. Discharged to [facility] rehab. - Readmission 5/6/195/8/19 to [facility] for worsening abdominal pain with N/V/D. General surgery consulted and IV antibiotics resumed. IR did not see any safe accessible window for percutaneous drainage of abdominal abscess. On 09/28/17, seen emergently by cardiology Carlyle Dolly, MD for [facility] with rate up to 200 bpm. She did not lose a pulse, but became hypotensive and required cardioversion with a single synchronized 200J shock to SR. She was started on amiodarone, and ultimately transferred to [facility] for EP evaluation and further management of her diverticular abscess.  - Admission 09/23/17-09/23/17 for sigmoid diverticulitis with abscess treated with IV antibiotics and pain medication. Leukocytosis improved, so she was discharged home on Cipro and Flagyl with general surgery planning elective partial colectomy in 4-6 weeks.  Following cardiology evaluation 04/04/18 by Leanor Kail, PA, he wrote, She is "easily getting > 4 mets of activity. Given past medical history and based on ACC/AHA guidelines, LASYA VETTER would be at acceptable risk for the planned procedure without further cardiovascular testing.  - Watch for post op arrhythmias. Reviewed with pharmacist and Dr. Caryl Comes regarding anticoagulation. She is not on blood thinner for many months. May consider after GI issue resolves during follow up."   Based on currently available information, I would anticipate that she can proceed as planned if no acute changes; However, she did require re-intubation on POD#2 following partial colon resection, but this was also in the setting of sepsis. She has also since quit smoking. Cardiology does recommend watching for post-operative arrhythmias, so this will need to be taken into consideration. A1c specimen not received at [facility] lab, so will need to be drawn on the day of her procedure. She will also get a  CBG on arrival--she was hypoglycemic at PST at 42, recheck after eating  83 (and 109 with venous labs). PST RN did give patient information on "How to Manage Your Diabetes Before and After Surgery" as well as preoperative DM medication instructions. Patient to follow Bowel Prep Instructions per Dr. Marcello Moores.   VS: BP (!) 164/82   Pulse 64   Temp 37 C (Oral)   Ht 5\' 3"  (1.6 m)   Wt 59.4 kg   SpO2 99%   BMI 23.21 kg/m     PROVIDERS: Asencion Noble, MD is PCP  - Virl Axe, MD is EP cardiologist. Last visit 04/04/18 with Leanor Kail, [place] on 04/04/18 for preoperative evaluation (see above). Last seen by Dr. Caryl Comes on 11/03/17 for follow-up of afib/atrial tachycardiac. She was seen initially seen by Terald Sleeper, MD in 2001 for chest pain and had only mild CAD. She was later seen by Jacqulyn Ducking, MD 2012-2013 with testing for evaluation of palpitations/rapid heartbeat with associated chest burning and then Carlyle Dolly, MD in 2015 after hospitalization for rapid afib. She was referred to EP cardiology and has been seeing Dr. Caryl Comes regularly since 01/2014. Of note, she was started on Xarelto for afib in 07/2013, but it was discontinued due to GI bleeding (no identifiable source, possibly related to hemorrhoids).    LABS: Labs reviewed: Acceptable for surgery. CBG and A1c to be drawn prior to her procedure. (all labs ordered are listed, but only abnormal results are displayed)  Labs Reviewed  BASIC METABOLIC PANEL - Abnormal; Notable for the following components:      Result Value   Glucose, Bld 109 (*)    All other components within normal limits  GLUCOSE, CAPILLARY - Abnormal; Notable for the following components:   Glucose-Capillary 42 (*)    All other components within normal limits  CBC  GLUCOSE, CAPILLARY    IMAGES: CXR 10/18/17 (in the setting of rupture diverticulitis): IMPRESSION: Small bilateral effusions with underlying atelectasis. Pulmonary venous congestion.   EKG: 04/04/18 ([facility]): SR, ST abnormality, consider digitalis  effect.    CV: Echo 09/28/17: Study Conclusions - Left ventricle: The cavity size was normal. Wall thickness was   increased in a pattern of mild LVH. Systolic function was   vigorous. The estimated ejection fraction was in the range of 65%   to 70%. Wall motion was normal; there were no regional wall   motion abnormalities. Left ventricular diastolic function   parameters were normal. - Aortic valve: Valve area (VTI): 1.96 cm^2. Valve area (Vmax):   1.96 cm^2. Valve area (Vmean): 1.87 cm^2. - Mitral valve: There was mild regurgitation. - Left atrium: The atrium was moderately dilated. - Right atrium: The atrium was mildly dilated. - Atrial septum: No defect or patent foramen ovale was identified. - Pulmonary arteries: Systolic pressure was mildly increased. - Technically adequate study.  Carotid U/S 07/14/17: Final Interpretation: - Right Carotid: Velocities in the right ICA are consistent with a 1-39% stenosis. - Left Carotid: Velocities in the left ICA are consistent with a 1-39% stenosis. - Vertebrals:  Both vertebral arteries were patent with antegrade flow. - Subclavians: Normal flow hemodynamics were seen in bilateral subclavian arteries.  ETT 10/01/15:  Patient had reasonable exercise tolerance and far better on the flecainide that she had previously. No reported symptoms furing stress. No ST segment deviation noted during stress. No significant arrhythmias noted during the test. Estimated workload 5.2 METS. MPHR 148 bpm, Percent HR 81%. - Study Highlights: There was no ST  segment deviation noted during stress.  48 hour follow-up Holter monitor (on flecainide) 08/13/15: Dominant Rhythm  Sinus  Mean HR 57 Range HR 45-76 Total beats 165228 PVC 1 PAC 97 Atrial couplets  Tachycardia   Pauses none   Symptoms noted dizziness assoc with sinus    Rate control  Not an issue  48 hour Holter Monitor 07/28/15: Dominant Rhythm  Sinus   Mean HR 78 Range HR 49-119 PVC 1 PAC  35361 Tachycardia numerous runs, longest 5.96 sec; at rate of 173 Pauses also some slow HR into the 40s both during the day as well as night Symptoms noted none   Rate control  Will see what repeat shows on flecainide  Nuclear stress test 02/26/14: Overall Impression:  Normal stress nuclear study. There is no scar or ischemia. This is a low risk scan. LV Ejection Fraction: 70%.  LV Wall Motion:  Normal Wall Motion.  Cardiac cath 04/21/00: Left Main 20%. LAD multiple discrete 20% lesions in the proximal and mid portions. 40% distal lesion. CX multiple discrete lesions in the mid and distal portions. RCA was dominant. Multiple 30% discrete lesions in the proximal, mid, and distal vessel.  PDA multiple 20% discrete lesions. LV hyperdynamic LV function. EF > 80%. No gradient across the AV and no MR.  Impression: Chest pain would appear to be noncardiac in etiology.    Past Medical History:  Diagnosis Date  . Arteriosclerotic cardiovascular disease (ASCVD)    non-obstructive; negative stress nuclear and normal echo in 08/2009 [facility] cardiac [facility] in 03/2000-20%  main 20% LAD, 30% circumflex, 30% RCA, hyperdynamic LV  . Asthma    Mild  . Chest pain    none recent  . Chronic back pain   . Diabetes mellitus    type 2  . GERD (gastroesophageal reflux disease)   . Glaucoma    both eyes  . Hyperlipidemia    total cholesterol of 271 and LDL of 187 in 07/2010  . Hypertension   . Macular degeneration   . PAF (paroxysmal atrial fibrillation) ([place])   . Palpitations   . Stroke [facility]) 1993 2008   short term memory loss  . Tobacco abuse     Past Surgical History:  Procedure Laterality Date  . APPENDECTOMY    . CATARACT EXTRACTION     left  . COLECTOMY WITH COLOSTOMY CREATION/HARTMANN PROCEDURE Left 09/29/2017   Procedure: EXTENDED LEFT HEMI COLECTOMY WITH COLOSTOMY CREATION (HARTMANN PROCEDURE);  Surgeon: Greer Pickerel, MD;  Location: [place];  Service: General;  Laterality:  Left;  . COLONOSCOPY  2009  . COLONOSCOPY  03/30/2012   Procedure: COLONOSCOPY;  Surgeon: Rogene Houston, MD;  [facility];  Service: Endoscopy;  Laterality: N/A;  730  . COLONOSCOPY N/A 12/13/2013   Procedure: COLONOSCOPY;  Surgeon: Rogene Houston, MD;  [facility];  Service: Endoscopy;  Laterality: N/A;  1200  . ESOPHAGOGASTRODUODENOSCOPY  10/21/2011   Procedure: ESOPHAGOGASTRODUODENOSCOPY (EGD);  Surgeon: Rogene Houston, MD;  [facility];  Service: Endoscopy;  Laterality: N/A;  1200  . ESOPHAGOGASTRODUODENOSCOPY N/A 12/13/2013   Procedure: ESOPHAGOGASTRODUODENOSCOPY (EGD);  Surgeon: Rogene Houston, MD;  [facility];  Service: Endoscopy;  Laterality: N/A;  . LAPAROTOMY N/A 09/29/2017   Procedure: EXPLORATORY LAPAROTOMY;  Surgeon: Greer Pickerel, MD;  Location: [place];  Service: General;  Laterality: N/A;  . TUBAL LIGATION      MEDICATIONS: . Ascorbic Acid (VITAMIN C) 1000 MG tablet  .  aspirin 325 MG tablet  . Biotin 10 MG CAPS  . brimonidine (ALPHAGAN) 0.2 % ophthalmic solution  . Cholecalciferol (VITAMIN D) 1000 UNITS capsule  . Cyanocobalamin (VITAMIN B-12) 2500 MCG SUBL  . flecainide (TAMBOCOR) 100 MG tablet  . Insulin Glargine (LANTUS) 100 UNIT/ML Solostar Pen  . insulin regular (NOVOLIN R,HUMULIN R) 100 units/mL injection  . methocarbamol (ROBAXIN) 500 MG tablet  . metoprolol tartrate (LOPRESSOR) 25 MG tablet  . Multiple Vitamins-Minerals (PRESERVISION AREDS 2) CAPS  . nitroGLYCERIN (NITROSTAT) 0.4 MG SL tablet  . Polyethyl Glycol-Propyl Glycol (SYSTANE) 0.4-0.3 % SOLN  . polyethylene glycol (MIRALAX / GLYCOLAX) packet   No current facility-administered medications for this encounter.   She takes ASA 325 mg 1/2 tablet at bedtime. Per PST RN, patient reported she can continue ASA.  [facility] Short Stay Center/Anesthesiology Phone [phone] 05/22/2018 1:55 PM

## 2018-05-25 ENCOUNTER — Inpatient Hospital Stay (HOSPITAL_COMMUNITY)
Admission: RE | Admit: 2018-05-25 | Discharge: 2018-05-29 | DRG: 331 | Disposition: A | Discharge status: Home / Self Care | Payer: Medicare Other | Attending: General Surgery | Admitting: General Surgery

## 2018-05-25 ENCOUNTER — Encounter (HOSPITAL_COMMUNITY)
Admission: RE | Disposition: A | Discharge status: Home / Self Care | Payer: Self-pay | Source: Home / Self Care | Attending: General Surgery

## 2018-05-25 ENCOUNTER — Encounter (HOSPITAL_COMMUNITY): Payer: Self-pay | Admitting: *Deleted

## 2018-05-25 ENCOUNTER — Other Ambulatory Visit: Payer: Self-pay

## 2018-05-25 DIAGNOSIS — Z880 Allergy status to penicillin: Secondary | ICD-10-CM

## 2018-05-25 DIAGNOSIS — Z8249 Family history of ischemic heart disease and other diseases of the circulatory system: Secondary | ICD-10-CM | POA: Diagnosis not present

## 2018-05-25 DIAGNOSIS — E1151 Type 2 diabetes mellitus with diabetic peripheral angiopathy without gangrene: Secondary | ICD-10-CM | POA: Diagnosis present

## 2018-05-25 DIAGNOSIS — Z832 Family history of diseases of the blood and blood-forming organs and certain disorders involving the immune mechanism: Secondary | ICD-10-CM

## 2018-05-25 DIAGNOSIS — K5730 Diverticulosis of large intestine without perforation or abscess without bleeding: Secondary | ICD-10-CM | POA: Diagnosis not present

## 2018-05-25 DIAGNOSIS — Z808 Family history of malignant neoplasm of other organs or systems: Secondary | ICD-10-CM

## 2018-05-25 DIAGNOSIS — Z87891 Personal history of nicotine dependence: Secondary | ICD-10-CM | POA: Diagnosis not present

## 2018-05-25 DIAGNOSIS — I480 Paroxysmal atrial fibrillation: Secondary | ICD-10-CM | POA: Diagnosis not present

## 2018-05-25 DIAGNOSIS — R011 Cardiac murmur, unspecified: Secondary | ICD-10-CM | POA: Diagnosis not present

## 2018-05-25 DIAGNOSIS — Z8261 Family history of arthritis: Secondary | ICD-10-CM

## 2018-05-25 DIAGNOSIS — Z823 Family history of stroke: Secondary | ICD-10-CM | POA: Diagnosis not present

## 2018-05-25 DIAGNOSIS — Z8371 Family history of colonic polyps: Secondary | ICD-10-CM | POA: Diagnosis not present

## 2018-05-25 DIAGNOSIS — Z8042 Family history of malignant neoplasm of prostate: Secondary | ICD-10-CM | POA: Diagnosis not present

## 2018-05-25 DIAGNOSIS — Z433 Encounter for attention to colostomy: Secondary | ICD-10-CM | POA: Diagnosis not present

## 2018-05-25 DIAGNOSIS — Z7982 Long term (current) use of aspirin: Secondary | ICD-10-CM | POA: Diagnosis not present

## 2018-05-25 DIAGNOSIS — E7800 Pure hypercholesterolemia, unspecified: Secondary | ICD-10-CM | POA: Diagnosis present

## 2018-05-25 DIAGNOSIS — G43909 Migraine, unspecified, not intractable, without status migrainosus: Secondary | ICD-10-CM | POA: Diagnosis not present

## 2018-05-25 DIAGNOSIS — Z803 Family history of malignant neoplasm of breast: Secondary | ICD-10-CM

## 2018-05-25 DIAGNOSIS — Z8049 Family history of malignant neoplasm of other genital organs: Secondary | ICD-10-CM

## 2018-05-25 DIAGNOSIS — Z881 Allergy status to other antibiotic agents status: Secondary | ICD-10-CM

## 2018-05-25 DIAGNOSIS — I10 Essential (primary) hypertension: Secondary | ICD-10-CM | POA: Diagnosis present

## 2018-05-25 DIAGNOSIS — Z794 Long term (current) use of insulin: Secondary | ICD-10-CM

## 2018-05-25 DIAGNOSIS — Z91030 Bee allergy status: Secondary | ICD-10-CM

## 2018-05-25 DIAGNOSIS — Z833 Family history of diabetes mellitus: Secondary | ICD-10-CM

## 2018-05-25 DIAGNOSIS — Z800 Family history of malignant neoplasm of digestive organs: Secondary | ICD-10-CM | POA: Diagnosis not present

## 2018-05-25 DIAGNOSIS — Z933 Colostomy status: Secondary | ICD-10-CM

## 2018-05-25 DIAGNOSIS — K660 Peritoneal adhesions (postprocedural) (postinfection): Secondary | ICD-10-CM | POA: Diagnosis present

## 2018-05-25 DIAGNOSIS — E785 Hyperlipidemia, unspecified: Secondary | ICD-10-CM | POA: Diagnosis not present

## 2018-05-25 DIAGNOSIS — K9409 Other complications of colostomy: Secondary | ICD-10-CM | POA: Diagnosis not present

## 2018-05-25 DIAGNOSIS — K219 Gastro-esophageal reflux disease without esophagitis: Secondary | ICD-10-CM | POA: Diagnosis present

## 2018-05-25 DIAGNOSIS — Z882 Allergy status to sulfonamides status: Secondary | ICD-10-CM

## 2018-05-25 DIAGNOSIS — D123 Benign neoplasm of transverse colon: Secondary | ICD-10-CM | POA: Diagnosis not present

## 2018-05-25 HISTORY — PX: POLYPECTOMY: SHX5525

## 2018-05-25 HISTORY — PX: COLONOSCOPY: SHX5424

## 2018-05-25 LAB — GLUCOSE, CAPILLARY
GLUCOSE-CAPILLARY: 99 mg/dL (ref 70–99)
GLUCOSE-CAPILLARY: 60 mg/dL — AB (ref 70–99)
GLUCOSE-CAPILLARY: 90 mg/dL (ref 70–99)
GLUCOSE-CAPILLARY: 121 mg/dL — AB (ref 70–99)
Glucose-Capillary: 45 mg/dL — ABNORMAL LOW (ref 70–99)
Glucose-Capillary: 55 mg/dL — ABNORMAL LOW (ref 70–99)
Glucose-Capillary: 265 mg/dL — ABNORMAL HIGH (ref 70–99)

## 2018-05-25 SURGERY — COLONOSCOPY
Anesthesia: Moderate Sedation

## 2018-05-25 MED ORDER — INSULIN ASPART 100 UNIT/ML ~~LOC~~ SOLN: 0.0000 [IU] | Freq: Four times a day (QID) | SUBCUTANEOUS | Status: DC

## 2018-05-25 MED ORDER — DIPHENHYDRAMINE HCL 50 MG/ML IJ SOLN
INTRAMUSCULAR | Status: AC
  Filled 2018-05-25: qty 1

## 2018-05-25 MED ORDER — POLYETHYL GLYCOL-PROPYL GLYCOL 0.4-0.3 % OP SOLN: 1.0000 [drp] | Freq: Two times a day (BID) | OPHTHALMIC | Status: DC

## 2018-05-25 MED ORDER — ALVIMOPAN 12 MG PO CAPS
12.0000 mg | ORAL_CAPSULE | ORAL | Status: AC
  Administered 2018-05-26: 12 mg via ORAL
  Filled 2018-05-25: qty 1

## 2018-05-25 MED ORDER — DEXTROSE 50 % IV SOLN
INTRAVENOUS | Status: AC
  Filled 2018-05-25: qty 50

## 2018-05-25 MED ORDER — MIDAZOLAM HCL (PF) 5 MG/ML IJ SOLN
INTRAMUSCULAR | Status: AC
  Filled 2018-05-25: qty 2

## 2018-05-25 MED ORDER — GENTAMICIN SULFATE 40 MG/ML IJ SOLN
5.0000 mg/kg | INTRAVENOUS | Status: AC
  Administered 2018-05-26: 300 mg via INTRAVENOUS
  Filled 2018-05-25 (×2): qty 7.5

## 2018-05-25 MED ORDER — METRONIDAZOLE 500 MG PO TABS
1000.0000 mg | ORAL_TABLET | ORAL | Status: AC
  Administered 2018-05-25 (×3): 1000 mg via ORAL
  Filled 2018-05-25 (×2): qty 2

## 2018-05-25 MED ORDER — METOPROLOL TARTRATE 12.5 MG HALF TABLET
12.5000 mg | ORAL_TABLET | Freq: Two times a day (BID) | ORAL | Status: DC
  Administered 2018-05-25 – 2018-05-29 (×7): 12.5 mg via ORAL
  Filled 2018-05-25 (×8): qty 1

## 2018-05-25 MED ORDER — ACETAMINOPHEN 500 MG PO TABS
1000.0000 mg | ORAL_TABLET | ORAL | Status: AC
  Administered 2018-05-26: 1000 mg via ORAL
  Filled 2018-05-25: qty 2

## 2018-05-25 MED ORDER — NEOMYCIN SULFATE 500 MG PO TABS
1000.0000 mg | ORAL_TABLET | ORAL | Status: DC
  Filled 2018-05-25 (×2): qty 2

## 2018-05-25 MED ORDER — FLECAINIDE ACETATE 100 MG PO TABS
100.0000 mg | ORAL_TABLET | Freq: Two times a day (BID) | ORAL | Status: DC
  Administered 2018-05-25 – 2018-05-29 (×8): 100 mg via ORAL
  Filled 2018-05-25 (×8): qty 1

## 2018-05-25 MED ORDER — CLINDAMYCIN PHOSPHATE 900 MG/50ML IV SOLN
900.0000 mg | INTRAVENOUS | Status: AC
  Administered 2018-05-26: 900 mg via INTRAVENOUS
  Filled 2018-05-25 (×2): qty 50

## 2018-05-25 MED ORDER — BRIMONIDINE TARTRATE 0.2 % OP SOLN
1.0000 [drp] | Freq: Three times a day (TID) | OPHTHALMIC | Status: DC
  Administered 2018-05-25 (×2): 1 [drp] via OPHTHALMIC
  Filled 2018-05-25: qty 5

## 2018-05-25 MED ORDER — ENOXAPARIN SODIUM 40 MG/0.4ML ~~LOC~~ SOLN: 40.0000 mg | Freq: Once | SUBCUTANEOUS | Status: DC

## 2018-05-25 MED ORDER — LACTATED RINGERS IV SOLN: INTRAVENOUS | Status: DC

## 2018-05-25 MED ORDER — INSULIN ASPART 100 UNIT/ML ~~LOC~~ SOLN: 0.0000 [IU] | Freq: Three times a day (TID) | SUBCUTANEOUS | Status: DC

## 2018-05-25 MED ORDER — NEOMYCIN SULFATE 500 MG PO TABS
1000.0000 mg | ORAL_TABLET | ORAL | Status: DC
  Filled 2018-05-25 (×3): qty 2

## 2018-05-25 MED ORDER — BUPIVACAINE LIPOSOME 1.3 % IJ SUSP
20.0000 mL | Freq: Once | INTRAMUSCULAR | Status: DC
  Filled 2018-05-25: qty 20

## 2018-05-25 MED ORDER — FENTANYL CITRATE (PF) 100 MCG/2ML IJ SOLN
INTRAMUSCULAR | Status: AC
  Filled 2018-05-25: qty 2

## 2018-05-25 MED ORDER — METRONIDAZOLE 500 MG PO TABS: 1000.0000 mg | ORAL_TABLET | ORAL | Status: DC

## 2018-05-25 MED ORDER — MIDAZOLAM HCL 2 MG/2ML IJ SOLN
INTRAMUSCULAR | Status: DC | PRN
  Administered 2018-05-25: 1 mg via INTRAVENOUS
  Administered 2018-05-25: 2 mg via INTRAVENOUS

## 2018-05-25 MED ORDER — METHOCARBAMOL 500 MG PO TABS
500.0000 mg | ORAL_TABLET | Freq: Every day | ORAL | Status: DC
  Administered 2018-05-25 – 2018-05-28 (×4): 500 mg via ORAL
  Filled 2018-05-25 (×5): qty 1

## 2018-05-25 MED ORDER — POLYVINYL ALCOHOL 1.4 % OP SOLN
1.0000 [drp] | Freq: Two times a day (BID) | OPHTHALMIC | Status: DC
  Administered 2018-05-25 – 2018-05-29 (×6): 1 [drp] via OPHTHALMIC
  Filled 2018-05-25: qty 15

## 2018-05-25 MED ORDER — INSULIN ASPART 100 UNIT/ML ~~LOC~~ SOLN
0.0000 [IU] | Freq: Four times a day (QID) | SUBCUTANEOUS | Status: DC
  Administered 2018-05-26: 15 [IU] via SUBCUTANEOUS
  Administered 2018-05-26: 8 [IU] via SUBCUTANEOUS
  Administered 2018-05-27: 11 [IU] via SUBCUTANEOUS
  Administered 2018-05-27: 8 [IU] via SUBCUTANEOUS

## 2018-05-25 MED ORDER — NITROGLYCERIN 0.4 MG SL SUBL: 0.4000 mg | SUBLINGUAL_TABLET | SUBLINGUAL | Status: DC | PRN

## 2018-05-25 MED ORDER — INSULIN ASPART 100 UNIT/ML ~~LOC~~ SOLN
8.0000 [IU] | Freq: Once | SUBCUTANEOUS | Status: AC
  Administered 2018-05-25: 8 [IU] via SUBCUTANEOUS

## 2018-05-25 MED ORDER — FENTANYL CITRATE (PF) 100 MCG/2ML IJ SOLN
INTRAMUSCULAR | Status: DC | PRN
  Administered 2018-05-25 (×2): 25 ug via INTRAVENOUS

## 2018-05-25 MED ORDER — DEXTROSE 50 % IV SOLN
25.0000 g | INTRAVENOUS | Status: AC
  Administered 2018-05-25: 12.5 g via INTRAVENOUS

## 2018-05-25 MED ORDER — GABAPENTIN 300 MG PO CAPS
300.0000 mg | ORAL_CAPSULE | ORAL | Status: AC
  Administered 2018-05-26: 300 mg via ORAL
  Filled 2018-05-25: qty 1

## 2018-05-25 NOTE — H&P (Signed)
Pt underwent emergent exploratory laparotomy on May 9. She had perforated sigmoid diverticulitis with intra-abdominal abscess. She underwent left colectomy and sigmoid colectomy with end transverse colon colostomy, drainage of abscess and mobilization of splenic flexure. She is no longer smoking. Her last colonoscopy was in 2015. She is prescribed Eliquis for atrial fibrillation but states that she is not taking this currently.   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 (for muscle spasms) OxyCODONE HCl (5MG  Tablet, 1-2 Oral every six hours, as needed for pain, Taken starting 10/25/2017) Active. Pantoprazole Sodium (40MG  Tablet DR, Oral) Active. Flecainide Acetate (100MG  Tablet, Oral) Active. Metoprolol Tartrate (25MG  Tablet, Oral) Active. Nitroglycerin (0.4MG  Tab Sublingual, Sublingual) Active. Baby Aspirin (81MG  Tablet Chewable, Oral) Active. B Complex (Oral) Active. Calcium Carbonate (260MG  Tablet Chewable, Oral) Active. Cholecalciferol (1000UNIT Tablet Chewable, Oral) Active. Multivitamin Adult (Oral) Active. MiraLax (Oral) Active. Vitamin C (1000MG  Tablet, Oral) Active. Vitamin E (1000UNIT Capsule, Oral) Active. Lantus (100UNIT/ML Solution, Subcutaneous) Active. Medications  Reconciled  Social History Leighton Ruff, MD; 52/84/1324 11:41 AM) Alcohol use  Occasional alcohol use. Caffeine use  Coffee. No drug use  Tobacco use  Former smoker.  Family History Leighton Ruff, MD; 40/02/2724 11:41 AM) Arthritis  Mother, Sister. Bleeding disorder  Daughter, Family Members In General. Breast Cancer  Daughter, Sister. Cancer  Father. Cerebrovascular Accident  Daughter, Mother. Cervical Cancer  Family Members In General. Colon Cancer  Sister. Colon Polyps  Brother, Daughter, Family Members In General, Father, Mother, Sister. Diabetes Mellitus  Brother, Family Members In [place], Father. Heart Disease  Brother, Mother, Sister. Heart disease in female family member before age 72  Heart disease in female family member before age 62  Hypertension  Mother. Ischemic Bowel Disease  Daughter, Family Members In General, Mother, Sister. Melanoma  Mother, Sister. Migraine Headache  Daughter, Family Members In General. Prostate Cancer  Brother. Thyroid problems  Daughter.  Pregnancy / Birth History Leighton Ruff, MD; 36/[birth year]34 11:41 AM) Age at menarche  67 years. Irregular periods  Maternal age  45-20 Para  1  Other Problems Leighton Ruff, MD; 74/25/9563 11:41 AM) Anxiety Disorder  Asthma  Atrial Fibrillation  Back Pain  Bladder Problems  Cerebrovascular Accident  Chest pain  Diabetes Mellitus  Diverticulosis  Gastroesophageal Reflux Disease  Heart murmur  High blood pressure  Hypercholesterolemia  Migraine Headache  Transfusion history     Review of Systems  General Present- Appetite Loss, Chills, Fatigue and Weight Loss. Not Present- Fever, Night Sweats and Weight Gain. Skin Present- Dryness. Not Present- Change in Wart/Mole, Hives, Jaundice, New Lesions, Non-Healing Wounds, Rash and Ulcer. HEENT Present- Visual Disturbances and Wears glasses/contact lenses. Not Present- Earache, Hearing Loss, Hoarseness,  Nose Bleed, Oral Ulcers, Ringing in the Ears, Seasonal Allergies, Sinus Pain, Sore Throat and Yellow Eyes. Cardiovascular Present- Chest Pain, Difficulty Breathing Lying Down, Palpitations, Rapid Heart Rate, Shortness of Breath and Swelling of Extremities. Not Present- Leg Cramps. Gastrointestinal Present-  Abdominal Pain, Excessive gas and Gets full quickly at meals. Not Present- Bloating, Bloody Stool, Change in Bowel Habits, Chronic diarrhea, Constipation, Difficulty Swallowing, Hemorrhoids, Indigestion, Nausea, Rectal Pain and Vomiting. Female Genitourinary Present- Urgency. Not Present- Frequency, Nocturia, Painful Urination and Pelvic Pain. Musculoskeletal Present- Back Pain, Joint Pain, Muscle Weakness and Swelling of Extremities. Not Present- Joint Stiffness and Muscle Pain. Neurological Present- Decreased Memory, Numbness, Tingling, Trouble walking and Weakness. Not Present- Fainting, Headaches, Seizures and Tremor. Psychiatric Present- Anxiety. Not Present- Bipolar, Change in Sleep Pattern, Depression, Fearful and Frequent crying. Endocrine Present- Cold Intolerance. Not Present- Excessive Hunger, Hair Changes, Heat Intolerance, Hot flashes and New Diabetes.  BP (!) 166/49   Pulse 65   Temp 98.1 F (36.7 C) (Oral)   Ht 5\' 3"  (1.6 m)   Wt 59.4 kg   SpO2 100%   BMI 23.20 kg/m     Physical Exam  General Mental Status-Alert. General Appearance-Consistent with stated age. Hydration-Well hydrated. Voice-Normal.  Chest and Lung Exam Chest and lung exam reveals -quiet, even and easy respiratory effort with no use of accessory muscles. Inspection Chest Wall - Normal. Back - normal.  Cardiovascular Auscultation Rhythm - Regular.  Abdomen Inspection Skin - Scar - Note: Incision: well healed. no appreciable hernia. colostomy mid L abd. Palpation/Percussion Palpation and Percussion of the abdomen reveal - Soft, Non Tender, No Rebound tenderness and No Rigidity  (guarding).    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 She has since recovered from this and is ready to discuss colostomy reversal. On exam her abdomen is well healed and her ostomy is functioning appropriately. We discussed colostomy reversal in detail. We discussed that this can usually be done laparoscopically but we may need to perform another open operation. We will get cardiac clearance prior to surgery. I will plan on doing a colonoscopy today to check for any further pathology.  Risks include bleeding, perforation and missed pathology.  The surgery and anatomy were described to the patient as well as the risks of surgery and the possible complications. These include: Bleeding, deep abdominal infections and possible wound complications such as hernia and infection, damage to adjacent structures, leak of surgical connections, which can lead to other surgeries and possibly an ostomy, possible need for other procedures, such as abscess drains in radiology, possible prolonged hospital stay, possible diarrhea from removal of part of the colon, possible constipation from narcotics, possible bowel, bladder or sexual dysfunction if having rectal surgery, prolonged fatigue/weakness or appetite loss, possible early recurrence of of disease, possible complications of their medical problems such as heart disease or arrhythmias or lung problems, death (less than 1%). I believe the patient understands and wishes to proceed with the surgery.

## 2018-05-25 NOTE — Op Note (Signed)
[facility] Patient Name: Barbara [name] Procedure Date: 05/25/2018 MRN: [id number] Attending MD: Leighton Ruff , MD Date of Birth: 12-15-1940 CSN: [id number] Age: 78 Admit Type: Inpatient Procedure:                Colonoscopy Indications:              Screening for colorectal malignant neoplasm Providers:                Leighton Ruff, MD, Cleda Daub, RN, William Dalton, Technician, Charolette Child, Technician Referring MD:              Medicines:                Fentanyl 50 micrograms IV, Midazolam 4 mg IV,                            Monitored Anesthesia Care Complications:            No immediate complications. Estimated blood loss:                            Minimal. Estimated Blood Loss:      Procedure:                Pre-Anesthesia Assessment:                           - Prior to the procedure, a History and Physical                            was performed, and patient medications and                            allergies were reviewed. The patient's tolerance of                            previous anesthesia was also reviewed. The risks                            and benefits of the procedure and the sedation                            options and risks were discussed with the patient.                            All questions were answered, and informed consent                            was obtained. Prior Anticoagulants: The patient has                            taken Plavix (clopidogrel) in the past but is off                            this  now. ASA grade 2. After reviewing the risks                            and benefits, the patient was deemed in                            satisfactory condition to undergo the procedure.                           After obtaining informed consent, the colonoscope                            was passed under direct vision. Throughout the                            procedure, the patient's blood  pressure, pulse, and                            oxygen saturations were monitored continuously. The                            CF-HQ190L ([id number] Olympus Adult Colonoscope was                            introduced through the transverse colostomy and                            advanced to the the cecum, identified by                            appendiceal orifice and ileocecal valve. The                            colonoscopy was performed without difficulty. The                            patient tolerated the procedure well. The quality                            of the bowel preparation was excellent. The                            ileocecal valve and the appendiceal orifice were                            photographed. Scope withdrawal time was 16 minutes. Scope In: 12:35:07 PM Scope Out: 12:56:25 PM Scope Withdrawal Time: 0 hours 15 minutes 50 seconds  Total Procedure Duration: 0 hours 21 minutes 18 seconds  Findings:      The perianal and digital rectal examinations were normal. Pertinent       negatives include normal sphincter tone.      A 3 mm polyp was found in the distal transverse colon. The polyp was       sessile. The polyp was removed with a cold snare. Resection  and       retrieval were complete. Estimated blood loss was minimal.      A few small-mouthed diverticula were found in the transverse colon and       ascending colon. Impression:               - One 3 mm polyp in the distal transverse colon,                            removed with a cold snare. Resected and retrieved.                           - Diverticulosis in the transverse colon and in the                            ascending colon. Moderate Sedation:      Moderate (conscious) sedation was administered by the endoscopy nurse       and supervised by the endoscopist. The patient's oxygen saturation,       heart rate, blood pressure and response to care were monitored. Recommendation:           - Admit the  patient to hospital ward for ongoing                            care. Procedure Code(s):        --- Professional ---                           [phone], Colonoscopy, flexible; with removal of                            tumor(s), polyp(s), or other lesion(s) by snare                            technique Diagnosis Code(s):        --- Professional ---                           Z12.11, Encounter for screening for malignant                            neoplasm of colon                           D12.3, Benign neoplasm of transverse colon (hepatic                            flexure or splenic flexure)                           K57.30, Diverticulosis of large intestine without                            perforation or abscess without bleeding CPT copyright 2018 American Medical Association. All rights reserved. The codes documented in this report are preliminary and upon coder review may  be revised to meet current compliance requirements. Leighton Ruff, MD  Leighton Ruff, MD 10/30/8612 1:10:06 PM This report has been signed electronically. Number of Addenda: 0

## 2018-05-25 NOTE — Anesthesia Preprocedure Evaluation (Addendum)
Anesthesia Evaluation  Patient identified by MRN, date of birth, ID band Patient awake    Reviewed: Allergy & Precautions, NPO status , Patient's Chart, lab work & pertinent test results, reviewed documented beta blocker date and time   History of Anesthesia Complications Negative for: history of anesthetic complications  Airway Mallampati: II  TM Distance: >3 FB Neck ROM: Full    Dental  (+) Partial Lower   Pulmonary asthma , former smoker,    Pulmonary exam normal        Cardiovascular hypertension, Pt. on medications and Pt. on home beta blockers + Peripheral Vascular Disease  Normal cardiovascular exam+ dysrhythmias Atrial Fibrillation + Valvular Problems/Murmurs MR      Neuro/Psych CVA negative psych ROS   GI/Hepatic Neg liver ROS, GERD  ,  Endo/Other  diabetes, Insulin Dependent  Renal/GU negative Renal ROS  negative genitourinary   Musculoskeletal negative musculoskeletal ROS (+)   Abdominal   Peds  Hematology negative hematology ROS (+)   Anesthesia Other Findings 78 yo F for lap colostomy reversal  - CVA (1993, 2008), b/l carotid stenosis (1-39%), asthma, HTN, CAD, A Fib, PVD, GERD, IDDM  - prolonged admission May 2019 for diverticular abscess with associated shock; see note from Myra Gianotti, [place] for full details  - per cardiology, easily achieves >4 METS and is acceptable risk for procedure  Reproductive/Obstetrics                            Anesthesia Physical Anesthesia Plan  ASA: III  Anesthesia Plan: General   Post-op Pain Management:    Induction: Intravenous  PONV Risk Score and Plan: 3 and Ondansetron, Dexamethasone and Treatment may vary due to age or medical condition  Airway Management Planned: Oral ETT  Additional Equipment: None  Intra-op Plan:   Post-operative Plan: Extubation in OR  Informed Consent: I have reviewed the patients History and  Physical, chart, labs and discussed the procedure including the risks, benefits and alternatives for the proposed anesthesia with the patient or authorized representative who has indicated his/her understanding and acceptance.   Dental advisory given  Plan Discussed with:   Anesthesia Plan Comments:        Anesthesia Quick Evaluation

## 2018-05-25 NOTE — Progress Notes (Signed)
Patient CBG checked post procedure 60.  4 oz apple juice CBG 55.  4 more oz apple juice and 7.5 oz soda CBG 90.  Report given to floor nurse and CBG will be checked again.

## 2018-05-26 ENCOUNTER — Inpatient Hospital Stay (HOSPITAL_COMMUNITY): Admission: RE | Admit: 2018-05-26 | Payer: Medicare Other | Source: Home / Self Care | Admitting: General Surgery

## 2018-05-26 ENCOUNTER — Encounter (HOSPITAL_COMMUNITY): Payer: Self-pay | Admitting: Certified Registered Nurse Anesthetist

## 2018-05-26 ENCOUNTER — Inpatient Hospital Stay (HOSPITAL_COMMUNITY): Payer: Medicare Other | Admitting: Anesthesiology

## 2018-05-26 ENCOUNTER — Encounter (HOSPITAL_COMMUNITY)
Admission: RE | Disposition: A | Discharge status: Home / Self Care | Payer: Self-pay | Source: Home / Self Care | Attending: General Surgery

## 2018-05-26 HISTORY — PX: COLOSTOMY TAKEDOWN: SHX5258

## 2018-05-26 LAB — GLUCOSE, CAPILLARY
GLUCOSE-CAPILLARY: 303 mg/dL — AB (ref 70–99)
Glucose-Capillary: 121 mg/dL — ABNORMAL HIGH (ref 70–99)
Glucose-Capillary: 26 mg/dL — CL (ref 70–99)
Glucose-Capillary: 27 mg/dL — CL (ref 70–99)
Glucose-Capillary: 294 mg/dL — ABNORMAL HIGH (ref 70–99)
Glucose-Capillary: 309 mg/dL — ABNORMAL HIGH (ref 70–99)
Glucose-Capillary: 316 mg/dL — ABNORMAL HIGH (ref 70–99)
Glucose-Capillary: 384 mg/dL — ABNORMAL HIGH (ref 70–99)
Glucose-Capillary: 407 mg/dL — ABNORMAL HIGH (ref 70–99)
Glucose-Capillary: 59 mg/dL — ABNORMAL LOW (ref 70–99)
Glucose-Capillary: 71 mg/dL (ref 70–99)
Glucose-Capillary: 78 mg/dL (ref 70–99)

## 2018-05-26 LAB — MRSA PCR SCREENING: MRSA by PCR: NEGATIVE

## 2018-05-26 SURGERY — CLOSURE, COLOSTOMY, LAPAROSCOPIC
Anesthesia: General | Site: Abdomen

## 2018-05-26 MED ORDER — LIDOCAINE 2% (20 MG/ML) 5 ML SYRINGE
INTRAMUSCULAR | Status: DC | PRN
  Administered 2018-05-26: 80 mg via INTRAVENOUS

## 2018-05-26 MED ORDER — TRAMADOL HCL 50 MG PO TABS
50.0000 mg | ORAL_TABLET | Freq: Four times a day (QID) | ORAL | Status: DC | PRN
  Administered 2018-05-27 – 2018-05-29 (×3): 50 mg via ORAL
  Filled 2018-05-26 (×5): qty 1

## 2018-05-26 MED ORDER — ONDANSETRON HCL 4 MG/2ML IJ SOLN: 4.0000 mg | Freq: Once | INTRAMUSCULAR | Status: DC | PRN

## 2018-05-26 MED ORDER — FENTANYL CITRATE (PF) 250 MCG/5ML IJ SOLN
INTRAMUSCULAR | Status: DC | PRN
  Administered 2018-05-26: 25 ug via INTRAVENOUS
  Administered 2018-05-26: 75 ug via INTRAVENOUS

## 2018-05-26 MED ORDER — GABAPENTIN 300 MG PO CAPS
300.0000 mg | ORAL_CAPSULE | Freq: Two times a day (BID) | ORAL | Status: DC
  Administered 2018-05-26 – 2018-05-29 (×7): 300 mg via ORAL
  Filled 2018-05-26 (×7): qty 1

## 2018-05-26 MED ORDER — ALUM & MAG HYDROXIDE-SIMETH 200-200-20 MG/5ML PO SUSP: 30.0000 mL | Freq: Four times a day (QID) | ORAL | Status: DC | PRN

## 2018-05-26 MED ORDER — ONDANSETRON HCL 4 MG/2ML IJ SOLN: 4.0000 mg | Freq: Four times a day (QID) | INTRAMUSCULAR | Status: DC | PRN

## 2018-05-26 MED ORDER — ACETAMINOPHEN 500 MG PO TABS
1000.0000 mg | ORAL_TABLET | Freq: Four times a day (QID) | ORAL | Status: DC
  Administered 2018-05-26 – 2018-05-29 (×9): 1000 mg via ORAL
  Filled 2018-05-26 (×11): qty 2

## 2018-05-26 MED ORDER — ONDANSETRON HCL 4 MG/2ML IJ SOLN
INTRAMUSCULAR | Status: DC | PRN
  Administered 2018-05-26: 4 mg via INTRAVENOUS

## 2018-05-26 MED ORDER — LACTATED RINGERS IV SOLN
INTRAVENOUS | Status: DC | PRN
  Administered 2018-05-26 (×2): via INTRAVENOUS

## 2018-05-26 MED ORDER — LIDOCAINE 2% (20 MG/ML) 5 ML SYRINGE
INTRAMUSCULAR | Status: DC | PRN
  Administered 2018-05-26: 1 mg/kg/h via INTRAVENOUS

## 2018-05-26 MED ORDER — ROCURONIUM BROMIDE 10 MG/ML (PF) SYRINGE
PREFILLED_SYRINGE | INTRAVENOUS | Status: DC | PRN
  Administered 2018-05-26: 40 mg via INTRAVENOUS
  Administered 2018-05-26: 20 mg via INTRAVENOUS

## 2018-05-26 MED ORDER — FENTANYL CITRATE (PF) 100 MCG/2ML IJ SOLN
25.0000 ug | INTRAMUSCULAR | Status: DC | PRN
  Administered 2018-05-26: 25 ug via INTRAVENOUS
  Administered 2018-05-26: 50 ug via INTRAVENOUS

## 2018-05-26 MED ORDER — HYDROMORPHONE HCL 1 MG/ML IJ SOLN
0.5000 mg | INTRAMUSCULAR | Status: DC | PRN
  Administered 2018-05-27: 0.5 mg via INTRAVENOUS
  Filled 2018-05-26: qty 0.5

## 2018-05-26 MED ORDER — OXYCODONE HCL 5 MG PO TABS: 5.0000 mg | ORAL_TABLET | Freq: Once | ORAL | Status: DC | PRN

## 2018-05-26 MED ORDER — BUPIVACAINE-EPINEPHRINE (PF) 0.25% -1:200000 IJ SOLN
INTRAMUSCULAR | Status: AC
  Filled 2018-05-26: qty 30

## 2018-05-26 MED ORDER — BUPIVACAINE-EPINEPHRINE (PF) 0.25% -1:200000 IJ SOLN
INTRAMUSCULAR | Status: DC | PRN
  Administered 2018-05-26: 30 mL

## 2018-05-26 MED ORDER — FENTANYL CITRATE (PF) 100 MCG/2ML IJ SOLN
INTRAMUSCULAR | Status: AC
  Administered 2018-05-26: 25 ug via INTRAVENOUS
  Filled 2018-05-26: qty 2

## 2018-05-26 MED ORDER — ONDANSETRON HCL 4 MG PO TABS: 4.0000 mg | ORAL_TABLET | Freq: Four times a day (QID) | ORAL | Status: DC | PRN

## 2018-05-26 MED ORDER — ENSURE SURGERY PO LIQD
237.0000 mL | Freq: Two times a day (BID) | ORAL | Status: DC
  Administered 2018-05-27 – 2018-05-29 (×2): 237 mL via ORAL
  Filled 2018-05-26 (×7): qty 237

## 2018-05-26 MED ORDER — SACCHAROMYCES BOULARDII 250 MG PO CAPS
250.0000 mg | ORAL_CAPSULE | Freq: Two times a day (BID) | ORAL | Status: DC
  Administered 2018-05-26 – 2018-05-29 (×7): 250 mg via ORAL
  Filled 2018-05-26 (×8): qty 1

## 2018-05-26 MED ORDER — SODIUM CHLORIDE 0.9 % IR SOLN
Status: DC | PRN
  Administered 2018-05-26: 2000 mL

## 2018-05-26 MED ORDER — ALVIMOPAN 12 MG PO CAPS: 12.0000 mg | ORAL_CAPSULE | Freq: Two times a day (BID) | ORAL | Status: DC

## 2018-05-26 MED ORDER — PROPOFOL 10 MG/ML IV BOLUS
INTRAVENOUS | Status: DC | PRN
Start: 2018-05-26 — End: 2018-05-26
  Administered 2018-05-26: 110 mg via INTRAVENOUS

## 2018-05-26 MED ORDER — DEXTROSE 50 % IV SOLN
INTRAVENOUS | Status: AC
  Administered 2018-05-26: 25 mL
  Filled 2018-05-26: qty 50

## 2018-05-26 MED ORDER — DEXAMETHASONE SODIUM PHOSPHATE 10 MG/ML IJ SOLN
INTRAMUSCULAR | Status: DC | PRN
  Administered 2018-05-26: 4 mg via INTRAVENOUS

## 2018-05-26 MED ORDER — OXYCODONE HCL 5 MG/5ML PO SOLN
5.0000 mg | Freq: Once | ORAL | Status: DC | PRN
  Filled 2018-05-26: qty 5

## 2018-05-26 MED ORDER — ENOXAPARIN SODIUM 40 MG/0.4ML ~~LOC~~ SOLN
40.0000 mg | SUBCUTANEOUS | Status: DC
  Administered 2018-05-27 – 2018-05-29 (×3): 40 mg via SUBCUTANEOUS
  Filled 2018-05-26 (×3): qty 0.4

## 2018-05-26 MED ORDER — EPHEDRINE SULFATE-NACL 50-0.9 MG/10ML-% IV SOSY
PREFILLED_SYRINGE | INTRAVENOUS | Status: DC | PRN
  Administered 2018-05-26: 10 mg via INTRAVENOUS

## 2018-05-26 MED ORDER — PHENYLEPHRINE HCL 10 MG/ML IJ SOLN
INTRAMUSCULAR | Status: DC | PRN
  Administered 2018-05-26: 50 ug/min via INTRAVENOUS

## 2018-05-26 MED ORDER — LACTATED RINGERS IR SOLN
Status: DC | PRN
  Administered 2018-05-26: 1000 mL

## 2018-05-26 MED ORDER — CLINDAMYCIN PHOSPHATE 900 MG/50ML IV SOLN
900.0000 mg | Freq: Three times a day (TID) | INTRAVENOUS | Status: AC
  Administered 2018-05-26: 900 mg via INTRAVENOUS
  Filled 2018-05-26 (×2): qty 50

## 2018-05-26 MED ORDER — BUPIVACAINE LIPOSOME 1.3 % IJ SUSP
INTRAMUSCULAR | Status: DC | PRN
  Administered 2018-05-26: 20 mL

## 2018-05-26 MED ORDER — SUGAMMADEX SODIUM 200 MG/2ML IV SOLN
INTRAVENOUS | Status: DC | PRN
  Administered 2018-05-26: 200 mg via INTRAVENOUS

## 2018-05-26 MED ORDER — PHENYLEPHRINE 40 MCG/ML (10ML) SYRINGE FOR IV PUSH (FOR BLOOD PRESSURE SUPPORT)
PREFILLED_SYRINGE | INTRAVENOUS | Status: DC | PRN
  Administered 2018-05-26 (×5): 80 ug via INTRAVENOUS

## 2018-05-26 MED ORDER — INSULIN ASPART 100 UNIT/ML ~~LOC~~ SOLN
SUBCUTANEOUS | Status: AC
  Filled 2018-05-26: qty 1

## 2018-05-26 MED ORDER — KCL IN DEXTROSE-NACL 20-5-0.9 MEQ/L-%-% IV SOLN
INTRAVENOUS | Status: DC
  Administered 2018-05-26 – 2018-05-27 (×4): via INTRAVENOUS
  Filled 2018-05-26 (×4): qty 1000

## 2018-05-26 SURGICAL SUPPLY — 71 items
APPLIER CLIP 5 13 M/L LIGAMAX5 (MISCELLANEOUS)
BLADE EXTENDED COATED 6.5IN (ELECTRODE) IMPLANT
CABLE HIGH FREQUENCY MONO STRZ (ELECTRODE) IMPLANT
CELLS DAT CNTRL 66122 CELL SVR (MISCELLANEOUS) IMPLANT
CHLORAPREP W/TINT 26ML (MISCELLANEOUS) ×2 IMPLANT
CLIP APPLIE 5 13 M/L LIGAMAX5 (MISCELLANEOUS) IMPLANT
COVER WAND RF STERILE (DRAPES) ×2 IMPLANT
DECANTER SPIKE VIAL GLASS SM (MISCELLANEOUS) ×2 IMPLANT
DERMABOND ADVANCED (GAUZE/BANDAGES/DRESSINGS) ×1
DERMABOND ADVANCED .7 DNX12 (GAUZE/BANDAGES/DRESSINGS) ×1 IMPLANT
DRAIN CHANNEL 19F RND (DRAIN) IMPLANT
DRAPE LAPAROSCOPIC ABDOMINAL (DRAPES) ×2 IMPLANT
DRAPE SURG IRRIG POUCH 19X23 (DRAPES) ×2 IMPLANT
DRSG OPSITE POSTOP 4X10 (GAUZE/BANDAGES/DRESSINGS) IMPLANT
DRSG OPSITE POSTOP 4X6 (GAUZE/BANDAGES/DRESSINGS) ×2 IMPLANT
DRSG OPSITE POSTOP 4X8 (GAUZE/BANDAGES/DRESSINGS) IMPLANT
ELECT PENCIL ROCKER SW 15FT (MISCELLANEOUS) ×4 IMPLANT
ELECT REM PT RETURN 15FT ADLT (MISCELLANEOUS) ×2 IMPLANT
EVACUATOR SILICONE 100CC (DRAIN) IMPLANT
GAUZE SPONGE 2X2 8PLY STRL LF (GAUZE/BANDAGES/DRESSINGS) ×1 IMPLANT
GAUZE SPONGE 4X4 12PLY STRL (GAUZE/BANDAGES/DRESSINGS) IMPLANT
GLOVE BIO SURGEON STRL SZ 6.5 (GLOVE) ×4 IMPLANT
GLOVE BIOGEL PI IND STRL 7.0 (GLOVE) ×2 IMPLANT
GLOVE BIOGEL PI INDICATOR 7.0 (GLOVE) ×2
GOWN STRL REUS W/TWL 2XL LVL3 (GOWN DISPOSABLE) ×4 IMPLANT
GOWN STRL REUS W/TWL XL LVL3 (GOWN DISPOSABLE) ×8 IMPLANT
GRASPER ENDOPATH ANVIL 10MM (MISCELLANEOUS) IMPLANT
HOLDER FOLEY CATH W/STRAP (MISCELLANEOUS) ×2 IMPLANT
IRRIG SUCT STRYKERFLOW 2 WTIP (MISCELLANEOUS) ×2
IRRIGATION SUCT STRKRFLW 2 WTP (MISCELLANEOUS) ×1 IMPLANT
PACK COLON (CUSTOM PROCEDURE TRAY) ×2 IMPLANT
PAD POSITIONING PINK XL (MISCELLANEOUS) ×2 IMPLANT
PAD TELFA 2X3 NADH STRL (GAUZE/BANDAGES/DRESSINGS) ×2 IMPLANT
PORT LAP GEL ALEXIS MED 5-9CM (MISCELLANEOUS) ×2 IMPLANT
PROTECTOR NERVE ULNAR (MISCELLANEOUS) ×2 IMPLANT
RELOAD STAPLER GREEN 60MM (STAPLE) ×1 IMPLANT
RTRCTR WOUND ALEXIS 18CM MED (MISCELLANEOUS)
SCISSORS LAP 5X35 DISP (ENDOMECHANICALS) ×2 IMPLANT
SEALER TISSUE G2 STRG ARTC 35C (ENDOMECHANICALS) ×2 IMPLANT
SLEEVE XCEL OPT CAN 5 100 (ENDOMECHANICALS) ×2 IMPLANT
SPONGE DRAIN TRACH 4X4 STRL 2S (GAUZE/BANDAGES/DRESSINGS) IMPLANT
SPONGE GAUZE 2X2 STER 10/PKG (GAUZE/BANDAGES/DRESSINGS) ×1
SPONGE LAP 18X18 RF (DISPOSABLE) IMPLANT
STAPLER ECHELON LONG 60 440 (INSTRUMENTS) ×2 IMPLANT
STAPLER ECHELON POWER CIR 31 (STAPLE) ×2 IMPLANT
STAPLER RELOAD GREEN 60MM (STAPLE) ×2
STAPLER VISISTAT 35W (STAPLE) IMPLANT
SURGILUBE 2OZ TUBE FLIPTOP (MISCELLANEOUS) ×2 IMPLANT
SUT ETHILON 2 0 PS N (SUTURE) IMPLANT
SUT NOVA NAB GS-21 0 18 T12 DT (SUTURE) ×4 IMPLANT
SUT PDS AB 1 CTX 36 (SUTURE) IMPLANT
SUT PDS AB 1 TP1 96 (SUTURE) IMPLANT
SUT PROLENE 2 0 KS (SUTURE) IMPLANT
SUT SILK 2 0 (SUTURE)
SUT SILK 2 0 SH CR/8 (SUTURE) IMPLANT
SUT SILK 2-0 18XBRD TIE 12 (SUTURE) IMPLANT
SUT SILK 3 0 (SUTURE) ×1
SUT SILK 3 0 SH CR/8 (SUTURE) ×2 IMPLANT
SUT SILK 3-0 18XBRD TIE 12 (SUTURE) ×1 IMPLANT
SUT VIC AB 2-0 SH 18 (SUTURE) ×2 IMPLANT
SUT VIC AB 4-0 PS2 27 (SUTURE) ×2 IMPLANT
SYS LAPSCP GELPORT 120MM (MISCELLANEOUS)
SYSTEM LAPSCP GELPORT 120MM (MISCELLANEOUS) IMPLANT
TAPE CLOTH SURG 4X10 WHT LF (GAUZE/BANDAGES/DRESSINGS) ×2 IMPLANT
TOWEL OR NON WOVEN STRL DISP B (DISPOSABLE) ×2 IMPLANT
TRAY FOLEY CATH 14FRSI W/METER (CATHETERS) ×2 IMPLANT
TRAY FOLEY MTR SLVR 16FR STAT (SET/KITS/TRAYS/PACK) IMPLANT
TROCAR BLADELESS OPT 5 100 (ENDOMECHANICALS) ×2 IMPLANT
TROCAR XCEL BLUNT TIP 100MML (ENDOMECHANICALS) IMPLANT
TUBING CONNECTING 10 (TUBING) ×2 IMPLANT
TUBING INSUF HEATED (TUBING) ×2 IMPLANT

## 2018-05-26 NOTE — Transfer of Care (Signed)
Immediate Anesthesia Transfer of Care Note  Patient: Barbara [name]  Procedure(s) Performed: LAPAROSCOPIC COLOSTOMY REVERSAL ERAS PATHWAY (N/A Abdomen)  Patient Location: PACU  Anesthesia Type:General  Level of Consciousness: awake, alert  and oriented  Airway & Oxygen Therapy: Patient Spontanous Breathing and Patient connected to face mask oxygen  Post-op Assessment: Report given to RN and Post -op Vital signs reviewed and stable  Post vital signs: Reviewed and stable  Last Vitals:  Vitals Value Taken Time  BP    Temp    Pulse 78 05/26/2018 10:36 AM  Resp 18 05/26/2018 10:36 AM  SpO2 100 % 05/26/2018 10:36 AM  Vitals shown include unvalidated device data.  Last Pain:  Vitals:   05/26/18 0524  TempSrc: Oral  PainSc:          Complications: No apparent anesthesia complications

## 2018-05-26 NOTE — Op Note (Signed)
05/25/2018 - 05/26/2018  10:18 AM  PATIENT:  Barbara [name]  78 y.o. female  Patient Care Team: Asencion Noble, MD as PCP - General (Internal Medicine) Deboraha Sprang, MD as PCP - Cardiology (Cardiology) Donia Guiles Lavon Paganini, PA-C as Physician Assistant Deboraha Sprang, MD as Consulting Physician (Cardiology)  PRE-OPERATIVE DIAGNOSIS:  hartmans pouch  POST-OPERATIVE DIAGNOSIS:  hartmans pouch  PROCEDURE:  LAPAROSCOPIC COLOSTOMY REVERSAL  Surgeon(s): Leighton Ruff, MD Johnathan Hausen, MD  ASSISTANT: Dr Hassell Done   ANESTHESIA:   local and general  EBL: 193ml  Total I/O In: 1000 [I.V.:1000] Out: 100 [Blood:100]  Delay start of Pharmacological VTE agent (>24hrs) due to surgical blood loss or risk of bleeding:  no  DRAINS: none   SPECIMEN:  Source of Specimen:  colostomy  DISPOSITION OF SPECIMEN:  PATHOLOGY  COUNTS:  YES  PLAN OF CARE: Admit to inpatient   PATIENT DISPOSITION:  PACU - hemodynamically stable.  INDICATION:    78 y.o. F who underwent colostomy placement due to diverticular abscess and obstruction ~6 months ago.  I recommended colostomy reversal once she recovered from her initial surgery.  The anatomy & physiology of the digestive tract was discussed.  The pathophysiology was discussed.  Natural history risks without surgery was discussed.   I worked to give an overview of the disease and the frequent need to have multispecialty involvement.  I feel the risks of no intervention will lead to serious problems that outweigh the operative risks; therefore, I recommended a partial colectomy to remove the pathology.  Laparoscopic & open techniques were discussed.   Risks such as bleeding, infection, abscess, leak, reoperation, possible ostomy, hernia, heart attack, death, and other risks were discussed.  I noted a good likelihood this will help address the problem.   Goals of post-operative recovery were discussed as well.    The patient expressed understanding & wished to  proceed with surgery.  OR FINDINGS:  Distal transverse colostomy.  The anastomosis rests 15 cm from the anal verge by rigid proctoscopy.  DESCRIPTION:   Informed consent was confirmed.  The patient underwent general anaesthesia without difficulty.  The patient was positioned appropriately.  VTE prevention in place.  The patient's abdomen was clipped, prepped, & draped in a sterile fashion.  Surgical timeout confirmed our plan.  The patient was positioned in reverse Trendelenburg.  Abdominal entry was gained using a Varies needle in the LUQ.  Entry was clean.  I induced carbon dioxide insufflation and placed a 32mm port in the RUQ.  Camera inspection revealed no injury.  Extra ports were carefully placed under direct laparoscopic visualization after the omentum was bluntly mobilized from her midline scar.  I began by bluntly mobilizing the small bowel out of the pelvis.  Once this was completed the omentum was freed from the colostomy to allow for mobility of the colon down to the pelvis.  I identified the remaining lesser sac to approximately the mid transverse colon and divided this with the laparoscopic Enseal device.  Once this was completed I turned my attention to the pelvis.  I elevated the remaining sigmoid colon out of the pelvis and dissected underneath this in the medial portion at the presacral area.  I identified the rectosigmoid junction and divided the mesentery at this point using the Enseal device.  I mobilized the rectum circumferentially to allow for unkinking of the rectosigmoid junction and passing the stapler to this region.  I divided the sigmoid mesentery with the Enseal device distally to  avoid injury to the ureter.  This was identified in its normal anatomic position and freed from the colonic adhesions.  Once this was completed, the colostomy was taken down at the skin level using electrocautery and blunt dissection.  This dissection was carried down to the level of the fascia  until the colostomy was freed circumferentially.  Identified a portion of well-perfused colon and divided this over a pursestring device.  A 2-0 Prolene pursestring suture was placed and secured with 3-0 silk sutures.  A 31 mm EEA anvil was then placed into the colon and the pursestring was tied tightly around this.  This was then placed back into the abdomen and the abdomen was reinsufflated using the [place] wound protector device..  Additional adhesions were divided using the Enseal device to allow for mobilization of the colon down to the pelvis.  We then were able to pass the EEA stapler up the rectal stump to the rectosigmoid junction.  An anastomosis was created.  There was no tension.  There was no leak when tested with insufflation under water.  The anastomosis rest approximately 15 cm from the anal sphincter.  The abdomen was then irrigated with normal saline.  Hemostasis was good.  The omentum was brought down over the abdominal structures.  The patient was placed back into normal position.  We then switched to clean gowns, gloves, instruments and drapes.  The fascia of the ostomy site was closed using interrupted #1 Novafil sutures.  The subcutaneous tissue was reapproximated using a 2-0 Vicryl pursestring suture.  The dermal layer was also partially closed using a 2-0 Vicryl pursestring suture.  A Telfa wick was placed in the middle for drainage.  The port sites were then closed using 4-0 Vicryl suture and Dermabond.  The patient was then awakened from anesthesia and sent to the post anesthesia care unit in stable condition.  All counts were correct per operating room staff.  An MD assistant was necessary for tissue manipulation, retraction and positioning due to the complexity of the case and hospital policies

## 2018-05-26 NOTE — Progress Notes (Signed)
Hypoglycemic Event  CBG: 26  Treatment: Apple juice and Dex4 Fast acting glucose tablet  Symptoms:no symptoms currently  Follow-up CBG: Time  0115 CBG 71  Possible Reasons for Event: Diabetic  Comments/MD notified: yes    Kerney Elbe

## 2018-05-26 NOTE — Anesthesia Postprocedure Evaluation (Signed)
Anesthesia Post Note  Patient: Barbara [name]  Procedure(s) Performed: LAPAROSCOPIC COLOSTOMY REVERSAL ERAS PATHWAY (N/A Abdomen)     Patient location during evaluation: PACU Anesthesia Type: General Level of consciousness: awake and alert Pain management: pain level controlled Vital Signs Assessment: post-procedure vital signs reviewed and stable Respiratory status: spontaneous breathing, nonlabored ventilation and respiratory function stable Cardiovascular status: blood pressure returned to baseline and stable Postop Assessment: no apparent nausea or vomiting Anesthetic complications: no    Last Vitals:  Vitals:   05/26/18 1130 05/26/18 1145  BP: (!) 103/44 (!) 106/52  Pulse: 73 74  Resp: 12 13  Temp:    SpO2: 100% 100%    Last Pain:  Vitals:   05/26/18 1145  TempSrc:   PainSc: Asleep                 Lidia Collum

## 2018-05-26 NOTE — Interval H&P Note (Signed)
pt doing well this am.  ready for surgery.   all questions answered.     Leighton Ruff,  MD

## 2018-05-26 NOTE — Anesthesia Procedure Notes (Signed)
Procedure Name: Intubation Date/Time: 05/26/2018 8:26 AM Performed by: Mitzie Na, CRNA Pre-anesthesia Checklist: Patient identified, Emergency Drugs available, Suction available, Patient being monitored and Timeout performed Patient Re-evaluated:Patient Re-evaluated prior to induction Oxygen Delivery Method: Circle system utilized Preoxygenation: Pre-oxygenation with 100% oxygen Induction Type: IV induction Ventilation: Mask ventilation without difficulty and Oral airway inserted - appropriate to patient size Laryngoscope Size: Mac and 3 Grade View: Grade I Tube type: Oral Tube size: 7.0 mm Number of attempts: 1 Airway Equipment and Method: Stylet Placement Confirmation: ETT inserted through vocal cords under direct vision,  positive ETCO2 and breath sounds checked- equal and bilateral Secured at: 23 cm Tube secured with: Tape Dental Injury: Teeth and Oropharynx as per pre-operative assessment

## 2018-05-27 ENCOUNTER — Encounter (HOSPITAL_COMMUNITY): Payer: Self-pay | Admitting: General Surgery

## 2018-05-27 LAB — CBC
HCT: 36.8 % (ref 36.0–46.0)
Hemoglobin: 11.2 g/dL — ABNORMAL LOW (ref 12.0–15.0)
MCH: 29.8 pg (ref 26.0–34.0)
MCHC: 30.4 g/dL (ref 30.0–36.0)
MCV: 97.9 fL (ref 80.0–100.0)
Platelets: 208 10*3/uL (ref 150–400)
RBC: 3.76 MIL/uL — ABNORMAL LOW (ref 3.87–5.11)
RDW: 13 % (ref 11.5–15.5)
WBC: 10.5 10*3/uL (ref 4.0–10.5)
nRBC: 0 % (ref 0.0–0.2)

## 2018-05-27 LAB — BASIC METABOLIC PANEL
Anion gap: 8 (ref 5–15)
BUN: 9 mg/dL (ref 8–23)
CO2: 25 mmol/L (ref 22–32)
Calcium: 8.1 mg/dL — ABNORMAL LOW (ref 8.9–10.3)
Chloride: 104 mmol/L (ref 98–111)
Creatinine, Ser: 1.02 mg/dL — ABNORMAL HIGH (ref 0.44–1.00)
GFR calc Af Amer: >60 mL/min (ref >60)
GFR calc non Af Amer: 54 mL/min — ABNORMAL LOW (ref >60)
Glucose, Bld: 267 mg/dL — ABNORMAL HIGH (ref 70–99)
Potassium: 4.3 mmol/L (ref 3.5–5.1)
Sodium: 137 mmol/L (ref 135–145)

## 2018-05-27 LAB — GLUCOSE, CAPILLARY
Glucose-Capillary: 259 mg/dL — ABNORMAL HIGH (ref 70–99)
Glucose-Capillary: 127 mg/dL — ABNORMAL HIGH (ref 70–99)
Glucose-Capillary: 150 mg/dL — ABNORMAL HIGH (ref 70–99)

## 2018-05-27 MED ORDER — INSULIN ASPART 100 UNIT/ML ~~LOC~~ SOLN
4.0000 [IU] | Freq: Three times a day (TID) | SUBCUTANEOUS | Status: DC
  Administered 2018-05-27 – 2018-05-29 (×7): 4 [IU] via SUBCUTANEOUS

## 2018-05-27 MED ORDER — INSULIN GLARGINE 100 UNIT/ML ~~LOC~~ SOLN
18.0000 [IU] | Freq: Every day | SUBCUTANEOUS | Status: DC
  Administered 2018-05-27 – 2018-05-29 (×3): 18 [IU] via SUBCUTANEOUS
  Filled 2018-05-27 (×3): qty 0.18

## 2018-05-27 NOTE — Progress Notes (Signed)
PT Cancellation Note  Patient Details Name: Barbara [name] MRN: [id number] DOB: 1941-02-13   Cancelled Treatment:    Reason Eval/Treat Not Completed: Pain limiting ability to participate;Patient declined, no reason specified(Pt states she does not need PT as she is walking on her own, and she defers eval attempt today due to abdominal pain. Pt wants PT to check back tomorrow. )   Julien Girt, PT Acute Rehabilitation Services Pager [phone]  Office [phone]   Roxine Caddy D Elonda Husky 05/27/2018, 12:38 PM

## 2018-05-27 NOTE — Plan of Care (Signed)
Patient resting in bed this morning. Complains of slight pain in abdomen - requesting pain medication and coffee. No additional complaints or concerns noted. Will continue to monitor.

## 2018-05-27 NOTE — Progress Notes (Signed)
1 Day Post-Op lap colostomy reversal Subjective: Doing well.  Having BM's.  Pain controlled  Objective: Vital signs in last 24 hours: Temp:  [97.3 F (36.3 C)-98.8 F (37.1 C)] 98.8 F (37.1 C) (01/03 2358) Pulse Rate:  [72-82] 72 (01/03 2358) Resp:  [12-19] 18 (01/03 2358) BP: (96-153)/(41-58) 96/41 (01/03 2358) SpO2:  [99 %-100 %] 100 % (01/03 2358)   Intake/Output from previous day: 01/03 0701 - 01/04 0700 In: 4660 [P.O.:460; I.V.:4200] Out: 1740 [Urine:1640; Blood:100] Intake/Output this shift: No intake/output data recorded.   General appearance: alert and cooperative GI: normal findings: soft, non-tender  Incision: no significant drainage  Lab Results:  Recent Labs    05/27/18 0531  WBC 10.5  HGB 11.2*  HCT 36.8  PLT 208   BMET Recent Labs    05/27/18 0531  NA 137  K 4.3  CL 104  CO2 25  GLUCOSE 267*  BUN 9  CREATININE 1.02*  CALCIUM 8.1*   PT/INR No results for input(s): LABPROT, INR in the last 72 hours. ABG No results for input(s): PHART, HCO3 in the last 72 hours.  Invalid input(s): PCO2, PO2  MEDS, Scheduled . acetaminophen  1,000 mg Oral Q6H  . alvimopan  12 mg Oral BID  . enoxaparin (LOVENOX) injection  40 mg Subcutaneous Q24H  . feeding supplement  237 mL Oral BID BM  . flecainide  100 mg Oral BID  . gabapentin  300 mg Oral BID  . insulin aspart  4 Units Subcutaneous TID WC  . Insulin Glargine  18 Units Subcutaneous Daily  . methocarbamol  500 mg Oral QHS  . metoprolol tartrate  12.5 mg Oral BID  . polyvinyl alcohol  1 drop Both Eyes BID  . saccharomyces boulardii  250 mg Oral BID    Studies/Results: No results found.  Assessment: s/p Procedure(s): LAPAROSCOPIC COLOSTOMY REVERSAL ERAS PATHWAY Patient Active Problem List   Diagnosis Date Noted  . 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 Debility 10/10/2017  . Postoperative pain   . PAF (paroxysmal atrial fibrillation) ([place])   . Diabetes mellitus type 2 in nonobese (HCC)    . History of GI bleed   . Diabetic acidosis without coma ([place])   . Wide Complex Tachycardia/H/o SVT and H/o Afib 09/29/2017  . Sepsis ([place]) 09/29/2017  . Colonic diverticular abscess 09/27/2017  . Diverticulitis of both large and small intestine with perforation and abscess without bleeding 09/24/2017  . Unspecified atrial fibrillation ([place]) 09/23/2017  . Dizziness 07/28/2015  . Orthostatic hypotension 07/28/2015  . Rectal bleeding 12/05/2013  . Melena 12/05/2013  . Personal history of colonic polyps 12/05/2013  . Rapid atrial fibrillation ([place]) 08/15/2013  . Atrial fibrillation with RVR ([place]) 08/15/2013  . Right upper quadrant pain 11/16/2011  . Abdominal pain, acute, left lower quadrant 09/01/2011  . Cerebrovascular disease 10/09/2010  . Hypertension   . Hyperlipidemia   . Diabetes mellitus ([place])   . Arteriosclerotic cardiovascular disease (ASCVD)   . Macular degeneration   . Tobacco abuse   . Asthma   . Palpitations   . Chest pain     Expected post op course  Plan: d/c foley PAS Advance diet  Ambulate Home lantus and insulin for type 2 DM   LOS: 2 days     .Rosario Adie, [facility], [place]   05/27/2018 8:00 AM

## 2018-05-28 LAB — CBC
HCT: 34 % — ABNORMAL LOW (ref 36.0–46.0)
Hemoglobin: 10.4 g/dL — ABNORMAL LOW (ref 12.0–15.0)
MCH: 30 pg (ref 26.0–34.0)
MCHC: 30.6 g/dL (ref 30.0–36.0)
MCV: 98 fL (ref 80.0–100.0)
NRBC: 0 % (ref 0.0–0.2)
Platelets: 176 10*3/uL (ref 150–400)
RBC: 3.47 MIL/uL — ABNORMAL LOW (ref 3.87–5.11)
RDW: 13.1 % (ref 11.5–15.5)
WBC: 10 10*3/uL (ref 4.0–10.5)

## 2018-05-28 LAB — BASIC METABOLIC PANEL
Anion gap: 7 (ref 5–15)
BUN: 10 mg/dL (ref 8–23)
CO2: 23 mmol/L (ref 22–32)
Calcium: 8.3 mg/dL — ABNORMAL LOW (ref 8.9–10.3)
Chloride: 105 mmol/L (ref 98–111)
Creatinine, Ser: 0.93 mg/dL (ref 0.44–1.00)
GFR calc Af Amer: >60 mL/min (ref >60)
GFR calc non Af Amer: >60 mL/min (ref >60)
Glucose, Bld: 184 mg/dL — ABNORMAL HIGH (ref 70–99)
Potassium: 4.5 mmol/L (ref 3.5–5.1)
Sodium: 135 mmol/L (ref 135–145)

## 2018-05-28 LAB — GLUCOSE, CAPILLARY
Glucose-Capillary: 190 mg/dL — ABNORMAL HIGH (ref 70–99)
Glucose-Capillary: 131 mg/dL — ABNORMAL HIGH (ref 70–99)
Glucose-Capillary: 146 mg/dL — ABNORMAL HIGH (ref 70–99)
Glucose-Capillary: 73 mg/dL (ref 70–99)

## 2018-05-28 NOTE — Progress Notes (Signed)
2 Days Post-Op lap colostomy reversal Subjective: Doing well.  Having BM's.  Pain controlled  Objective: Vital signs in last 24 hours: Temp:  [98.3 F (36.8 C)-98.7 F (37.1 C)] 98.3 F (36.8 C) (01/05 0535) Pulse Rate:  [66-78] 66 (01/05 0535) Resp:  [14-18] 14 (01/05 0535) BP: (126-145)/(54-73) 145/55 (01/05 0535) SpO2:  [91 %-97 %] 95 % (01/05 0535) Weight:  [60.1 kg] 60.1 kg (01/05 0500)   Intake/Output from previous day: 01/04 0701 - 01/05 0700 In: 1310.5 [P.O.:1080; I.V.:230.5] Out: 1950 [Urine:1950] Intake/Output this shift: No intake/output data recorded.  General appearance: alert and cooperative GI: normal findings: soft, non-tender  Incision: no significant drainage  Lab Results:  Recent Labs    05/27/18 0531 05/28/18 0427  WBC 10.5 10.0  HGB 11.2* 10.4*  HCT 36.8 34.0*  PLT 208 176   BMET Recent Labs    05/27/18 0531 05/28/18 0427  NA 137 135  K 4.3 4.5  CL 104 105  CO2 25 23  GLUCOSE 267* 184*  BUN 9 10  CREATININE 1.02* 0.93  CALCIUM 8.1* 8.3*   PT/INR No results for input(s): LABPROT, INR in the last 72 hours. ABG No results for input(s): PHART, HCO3 in the last 72 hours.  Invalid input(s): PCO2, PO2  MEDS, Scheduled . acetaminophen  1,000 mg Oral Q6H  . enoxaparin (LOVENOX) injection  40 mg Subcutaneous Q24H  . feeding supplement  237 mL Oral BID BM  . flecainide  100 mg Oral BID  . gabapentin  300 mg Oral BID  . insulin aspart  4 Units Subcutaneous TID WC  . insulin glargine  18 Units Subcutaneous Daily  . methocarbamol  500 mg Oral QHS  . metoprolol tartrate  12.5 mg Oral BID  . polyvinyl alcohol  1 drop Both Eyes BID  . saccharomyces boulardii  250 mg Oral BID    Studies/Results: No results found.  Assessment: s/p Procedure(s): LAPAROSCOPIC COLOSTOMY REVERSAL ERAS PATHWAY Patient Active Problem List   Diagnosis Date Noted  . 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 Debility 10/10/2017  . Postoperative pain   . PAF  (paroxysmal atrial fibrillation) ([place])   . Diabetes mellitus type 2 in nonobese (HCC)   . History of GI bleed   . Diabetic acidosis without coma ([place])   . Wide Complex Tachycardia/H/o SVT and H/o Afib 09/29/2017  . Sepsis ([place]) 09/29/2017  . Colonic diverticular abscess 09/27/2017  . Diverticulitis of both large and small intestine with perforation and abscess without bleeding 09/24/2017  . Unspecified atrial fibrillation ([place]) 09/23/2017  . Dizziness 07/28/2015  . Orthostatic hypotension 07/28/2015  . Rectal bleeding 12/05/2013  . Melena 12/05/2013  . Personal history of colonic polyps 12/05/2013  . Rapid atrial fibrillation ([place]) 08/15/2013  . Atrial fibrillation with RVR ([place]) 08/15/2013  . Right upper quadrant pain 11/16/2011  . Abdominal pain, acute, left lower quadrant 09/01/2011  . Cerebrovascular disease 10/09/2010  . Hypertension   . Hyperlipidemia   . Diabetes mellitus ([place])   . Arteriosclerotic cardiovascular disease (ASCVD)   . Macular degeneration   . Tobacco abuse   . Asthma   . Palpitations   . Chest pain     Expected post op course  Plan: d/c foley PAS Cont diet  Ambulate/PT today Home lantus and insulin for type 2 DM Anticipate home in AM   LOS: 3 days     .Rosario Adie, [facility], [place]   05/28/2018 8:33 AM

## 2018-05-28 NOTE — Evaluation (Signed)
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  Clinical Impression  Pt is independent in her room and was able to ambulate the hall without an AD.  PT reviewed proper body mechanics for bed mobility after abd surgery and also facilitated stair training.  Incorporated parts of the Dynamic Gait Index with gait including quick stops, head turns, and head nods with no difficulty.  She does tend to have a slight sway to gait, but no LOB.  No skilled PT needs identified and will d/c.  Thank you for the referral.    Follow Up Recommendations No PT follow up    Equipment Recommendations  None recommended by PT    Recommendations for Other Services       Precautions / Restrictions Precautions Precautions: Other (comment) Precaution Comments: abd surgery      Mobility  Bed Mobility Overal bed mobility: Needs Assistance Bed Mobility: Rolling;Sidelying to Sit;Sit to Sidelying Rolling: Supervision Sidelying to sit: Supervision     Sit to sidelying: Supervision General bed mobility comments: S for cues only for proper body mechanics after abd surgery. Pt reports she has been getting up like this for a while.  Transfers Overall transfer level: Independent                  Ambulation/Gait Ambulation/Gait assistance: Modified independent (Device/Increase time) Gait Distance (Feet): 350 Feet Assistive device: None Gait Pattern/deviations: Step-through pattern     General Gait Details: She has a slight sway to her gait, but no LOB.  Incorporated quick stops, head turns and head nods with no difficulties.  Stairs Stairs: Yes Stairs assistance: Min guard Stair Management: One rail Right;One rail Left;Forwards Number of Stairs: 3 General stair comments: Pt reports a fear of heights and had to  avoid looking at stairwell that was going down because it would make her dizzy.  Wheelchair Mobility    Modified Rankin (Stroke Patients Only)       Balance Overall balance assessment: No apparent balance deficits (not formally assessed)                                           Pertinent Vitals/Pain Pain Assessment: No/denies pain    Home Living Family/patient expects to be discharged to:: Private residence Living Arrangements: Spouse/significant other Available Help at Discharge: Family Type of Home: House Home Access: Stairs to enter Entrance Stairs-Rails: Left Entrance Stairs-Number of Steps: 3 Home Layout: One level Home Equipment: Grab bars - tub/shower;Walker - 2 wheels;Bedside commode      Prior Function                 Hand Dominance   Dominant Hand: Right    Extremity/Trunk Assessment   Upper Extremity Assessment Upper Extremity Assessment: Overall WFL for tasks assessed    Lower Extremity Assessment Lower Extremity Assessment: Overall WFL for tasks assessed       Communication   Communication: No difficulties  Cognition Arousal/Alertness: Awake/alert Behavior During Therapy: WFL for tasks assessed/performed Overall Cognitive Status: Within Functional Limits for tasks assessed  General Comments General comments (skin integrity, edema, etc.): Pt and husband asking about if pt could drive. This was deferred to MD.    Exercises     Assessment/Plan    PT Assessment Patent does not need any further PT services  PT Problem List         PT Treatment Interventions      PT Goals (Current goals can be found in the Care Plan section)  Acute Rehab PT Goals Patient Stated Goal: home PT Goal Formulation: All assessment and education complete, DC therapy    Frequency     Barriers to discharge        Co-evaluation               AM-PAC PT "6 Clicks" Mobility   Outcome Measure Help needed turning from your back to your side while in a flat bed without using bedrails?: None Help needed moving from lying on your back to sitting on the side of a flat bed without using bedrails?: None Help needed moving to and from a bed to a chair (including a wheelchair)?: None Help needed standing up from a chair using your arms (e.g., wheelchair or bedside chair)?: None Help needed to walk in hospital room?: None Help needed climbing 3-5 steps with a railing? : A Little 6 Click Score: 23    End of Session Equipment Utilized During Treatment: Gait belt Activity Tolerance: Patient tolerated treatment well Patient left: in bed;with family/visitor present;with call bell/phone within reach Nurse Communication: Mobility status PT Visit Diagnosis: Difficulty in walking, not elsewhere classified (R26.2)    Time: 1914-7829 PT Time Calculation (min) (ACUTE ONLY): 15 min   Charges:   PT Evaluation $PT Eval Low Complexity: 1 Low          Barbara [name], [place] Pager [phone] 05/28/2018   Barbara [name] 05/28/2018, 11:03 AM

## 2018-05-28 NOTE — Progress Notes (Signed)
OT Cancellation Note  Patient Details Name: TRU LEOPARD MRN: [id number] DOB: 01-Dec-1940   Cancelled Treatment:    Reason Eval/Treat Not Completed: OT screened, no needs identified, will sign off Per PT and pt, pt independent and proficient in BADL- completing at baseline. Pt able to don/doff gown while standing, completing functional mobility at baseline. She reports to have a BSC and grab bars at home if needed, all equipment needs met. No further OT needs identified at this time, OT will sign off. Thank you for this referral.   Zenovia Jarred, MSOT, OTR/L Behavioral Health OT/ Acute Relief OT [facility] Office: [phone]  Zenovia Jarred 05/28/2018, 11:26 AM

## 2018-05-29 LAB — CBC
HEMATOCRIT: 34.4 % — AB (ref 36.0–46.0)
Hemoglobin: 10.6 g/dL — ABNORMAL LOW (ref 12.0–15.0)
MCH: 30.4 pg (ref 26.0–34.0)
MCHC: 30.8 g/dL (ref 30.0–36.0)
MCV: 98.6 fL (ref 80.0–100.0)
Platelets: 186 10*3/uL (ref 150–400)
RBC: 3.49 MIL/uL — ABNORMAL LOW (ref 3.87–5.11)
RDW: 13 % (ref 11.5–15.5)
WBC: 9.5 10*3/uL (ref 4.0–10.5)
nRBC: 0 % (ref 0.0–0.2)

## 2018-05-29 LAB — BASIC METABOLIC PANEL
Anion gap: 9 (ref 5–15)
BUN: 11 mg/dL (ref 8–23)
CO2: 25 mmol/L (ref 22–32)
Calcium: 8.8 mg/dL — ABNORMAL LOW (ref 8.9–10.3)
Chloride: 102 mmol/L (ref 98–111)
Creatinine, Ser: 0.92 mg/dL (ref 0.44–1.00)
GFR calc Af Amer: >60 mL/min (ref >60)
GFR calc non Af Amer: >60 mL/min (ref >60)
Glucose, Bld: 215 mg/dL — ABNORMAL HIGH (ref 70–99)
Potassium: 4.2 mmol/L (ref 3.5–5.1)
Sodium: 136 mmol/L (ref 135–145)

## 2018-05-29 LAB — GLUCOSE, CAPILLARY
Glucose-Capillary: 217 mg/dL — ABNORMAL HIGH (ref 70–99)
Glucose-Capillary: 189 mg/dL — ABNORMAL HIGH (ref 70–99)
Glucose-Capillary: 29 mg/dL — CL (ref 70–99)

## 2018-05-29 MED ORDER — TRAMADOL HCL 50 MG PO TABS: 50.0000 mg | ORAL_TABLET | Freq: Four times a day (QID) | ORAL | 0 refills | Status: DC | PRN

## 2018-05-29 NOTE — Plan of Care (Signed)
Pt alert and oriented, doing well this am.  Plan to d/c home per MD order. Dressing changed this am with scant drainage.  Pain well controlled with PO pain meds. RN will monitor.

## 2018-05-29 NOTE — Discharge Summary (Signed)
Physician Discharge Summary  Patient ID: MARGARUITE TOP MRN: [id number] DOB/AGE: [birth year] 78 y.o.  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  Her diet was advanced as tolerated.  By POD 3 she was ambulating well and pain was controlled with PO meds.    Consults: None  Significant Diagnostic Studies: labs: cbc, bmet  Treatments: IV hydration, analgesia: acetaminophen and surgery: lap colostomy reversal  Discharge Exam: Blood pressure (!) 132/45, pulse 72, temperature 99.2 F (37.3 C), temperature source Oral, resp. rate 18, height 5\' 3"  (1.6 m), weight 60.1 kg, SpO2 93 %. General appearance: alert and cooperative GI: normal findings: soft, appropriately tender Incision/Wound: clean  Disposition: home    Follow-up Information    Leighton Ruff, MD. Schedule an appointment as soon as possible for a visit in 2 week(s).   Specialty:  General Surgery Contact information: [place] [address] [phone]           Signed: Rosario Adie 10/24/2631, 7:29 AM

## 2018-05-29 NOTE — Progress Notes (Signed)
Discharge paperwork discussed with pt and husband at the bedside.  They demonstrated understanding, and pt was escorted in stable condition to main lobby by wheelchair.  Pt was given gauze and tape to perform daily dressing changes.

## 2018-05-29 NOTE — Discharge Instructions (Signed)
ABDOMINAL SURGERY: POST OP INSTRUCTIONS  1. DIET: Follow a light bland diet the first 24 hours after arrival home, such as soup, liquids, crackers, etc.  Be sure to include lots of fluids daily.  Avoid fast food or heavy meals as your are more likely to get nauseated.  Do not eat any uncooked fruits or vegetables for the next 2 weeks as your colon heals. 2. Take your usually prescribed home medications unless otherwise directed. 3. PAIN CONTROL: a. Pain is best controlled by a usual combination of three different methods TOGETHER: i. Ice/Heat ii. Over the counter pain medication iii. Prescription pain medication b. Most patients will experience some swelling and bruising around the incisions.  Ice packs or heating pads (30-60 minutes up to 6 times a day) will help. Use ice for the first few days to help decrease swelling and bruising, then switch to heat to help relax tight/sore spots and speed recovery.  Some people prefer to use ice alone, heat alone, alternating between ice & heat.  Experiment to what works for you.  Swelling and bruising can take several weeks to resolve.   c. It is helpful to take an over-the-counter pain medication regularly for the first few weeks.  Choose one of the following that works best for you: i. Naproxen (Aleve, etc)  Two 220mg  tabs twice a day ii. Ibuprofen (Advil, etc) Three 200mg  tabs four times a day (every meal & bedtime) iii. Acetaminophen (Tylenol, etc) 500-650mg  four times a day (every meal & bedtime) d. A  prescription for pain medication (such as oxycodone, hydrocodone, etc) should be given to you upon discharge.  Take your pain medication as prescribed.  i. If you are having problems/concerns with the prescription medicine (does not control pain, nausea, vomiting, rash, itching, etc), please call us [phone] to see if we need to switch you to a different pain medicine that will work better for you and/or control your side effect better. ii. If you  need a refill on your pain medication, please contact your pharmacy.  They will contact our office to request authorization. Prescriptions will not be filled after 5 pm or on week-ends. 4. Avoid getting constipated.  Between the surgery and the pain medications, it is common to experience some constipation.  Increasing fluid intake and taking a fiber supplement (such as Metamucil, Citrucel, FiberCon, MiraLax, etc) 1-2 times a day regularly will usually help prevent this problem from occurring.  A mild laxative (prune juice, Milk of Magnesia, MiraLax, etc) should be taken according to package directions if there are no bowel movements after 48 hours.   5. Watch out for diarrhea.  If you have many loose bowel movements, simplify your diet to bland foods & liquids for a few days.  Stop any stool softeners and decrease your fiber supplement.  Switching to mild anti-diarrheal medications (Kayopectate, Pepto Bismol) can help.  If this worsens or does not improve, please call us. 6. Wash / shower every day.  You may shower over the incision / wound.  Avoid baths until the skin is fully healed.  Continue to shower over incision(s) after the dressing is off. 7. Change your dressing daily 8. ACTIVITIES as tolerated:   a. You may resume regular (light) daily activities beginning the next day--such as daily self-care, walking, climbing stairs--gradually increasing activities as tolerated.  If you can walk 30 minutes without difficulty, it is safe to try more intense activity such as jogging, treadmill, bicycling, low-impact aerobics, swimming, etc. b.  Save the most intensive and strenuous activity for last such as sit-ups, heavy lifting, contact sports, etc  Refrain from any heavy lifting or straining until you are off narcotics for pain control.   c. DO NOT PUSH THROUGH PAIN.  Let pain be your guide: If it hurts to do something, don't do it.  Pain is your body warning you to avoid that activity for another week until  the pain goes down. d. You may drive when you are no longer taking prescription pain medication, you can comfortably wear a seatbelt, and you can safely maneuver your car and apply brakes. e. Dennis Bast may have sexual intercourse when it is comfortable.  9. FOLLOW UP in our office a. Please call [facility] at [phone] to set up an appointment to see your surgeon in the office for a follow-up appointment approximately 1-2 weeks after your surgery. b. Make sure that you call for this appointment the day you arrive home to insure a convenient appointment time. 10. IF YOU HAVE DISABILITY OR FAMILY LEAVE FORMS, BRING THEM TO THE OFFICE FOR PROCESSING.  DO NOT GIVE THEM TO YOUR DOCTOR.   WHEN TO CALL us [phone]: 1. Poor pain control 2. Reactions / problems with new medications (rash/itching, nausea, etc)  3. Fever over 101.5 F (38.5 C) 4. Inability to urinate 5. Nausea and/or vomiting 6. Worsening swelling or bruising 7. Continued bleeding from incision. 8. Increased pain, redness, or drainage from the incision  The clinic staff is available to answer your questions during regular business hours (8:30am-5pm).  Please dont hesitate to call and ask to speak to one of our nurses for clinical concerns.   A surgeon from [facility] is always on call at the hospitals   If you have a medical emergency, go to the nearest emergency room or call 911.    [facility], [place], [place], [place], [place]  [address] ? MAIN: [phone] ? TOLL FREE: [phone] ? FAX [phone] [email or web address]

## 2018-06-13 DIAGNOSIS — E1139 Type 2 diabetes mellitus with other diabetic ophthalmic complication: Secondary | ICD-10-CM | POA: Diagnosis not present

## 2018-06-14 DIAGNOSIS — E1165 Type 2 diabetes mellitus with hyperglycemia: Secondary | ICD-10-CM | POA: Diagnosis not present

## 2018-06-20 DIAGNOSIS — I480 Paroxysmal atrial fibrillation: Secondary | ICD-10-CM | POA: Diagnosis not present

## 2018-06-20 DIAGNOSIS — E1139 Type 2 diabetes mellitus with other diabetic ophthalmic complication: Secondary | ICD-10-CM | POA: Diagnosis not present

## 2018-07-06 DIAGNOSIS — H35721 Serous detachment of retinal pigment epithelium, right eye: Secondary | ICD-10-CM | POA: Diagnosis not present

## 2018-07-06 DIAGNOSIS — H353231 Exudative age-related macular degeneration, bilateral, with active choroidal neovascularization: Secondary | ICD-10-CM | POA: Diagnosis not present

## 2018-07-10 ENCOUNTER — Ambulatory Visit: Payer: Medicare Other | Admitting: Internal Medicine

## 2018-07-11 ENCOUNTER — Encounter: Payer: Self-pay | Admitting: Internal Medicine

## 2018-08-04 ENCOUNTER — Other Ambulatory Visit: Payer: Self-pay | Admitting: Internal Medicine

## 2018-09-15 DIAGNOSIS — Z79899 Other long term (current) drug therapy: Secondary | ICD-10-CM | POA: Diagnosis not present

## 2018-09-15 DIAGNOSIS — E1139 Type 2 diabetes mellitus with other diabetic ophthalmic complication: Secondary | ICD-10-CM | POA: Diagnosis not present

## 2018-09-15 DIAGNOSIS — I480 Paroxysmal atrial fibrillation: Secondary | ICD-10-CM | POA: Diagnosis not present

## 2018-09-15 DIAGNOSIS — I10 Essential (primary) hypertension: Secondary | ICD-10-CM | POA: Diagnosis not present

## 2018-09-21 DIAGNOSIS — E785 Hyperlipidemia, unspecified: Secondary | ICD-10-CM | POA: Diagnosis not present

## 2018-09-21 DIAGNOSIS — E1139 Type 2 diabetes mellitus with other diabetic ophthalmic complication: Secondary | ICD-10-CM | POA: Diagnosis not present

## 2018-09-21 DIAGNOSIS — I480 Paroxysmal atrial fibrillation: Secondary | ICD-10-CM | POA: Diagnosis not present

## 2018-09-21 DIAGNOSIS — M791 Myalgia, unspecified site: Secondary | ICD-10-CM | POA: Diagnosis not present

## 2018-09-28 DIAGNOSIS — Z961 Presence of intraocular lens: Secondary | ICD-10-CM | POA: Diagnosis not present

## 2018-09-28 DIAGNOSIS — H353231 Exudative age-related macular degeneration, bilateral, with active choroidal neovascularization: Secondary | ICD-10-CM | POA: Diagnosis not present

## 2018-09-28 DIAGNOSIS — H40003 Preglaucoma, unspecified, bilateral: Secondary | ICD-10-CM | POA: Diagnosis not present

## 2018-11-02 DIAGNOSIS — H353231 Exudative age-related macular degeneration, bilateral, with active choroidal neovascularization: Secondary | ICD-10-CM | POA: Diagnosis not present

## 2018-11-03 ENCOUNTER — Encounter (HOSPITAL_COMMUNITY): Payer: Self-pay

## 2018-11-03 ENCOUNTER — Emergency Department (HOSPITAL_COMMUNITY): Payer: Medicare Other

## 2018-11-03 ENCOUNTER — Other Ambulatory Visit: Payer: Self-pay

## 2018-11-03 ENCOUNTER — Inpatient Hospital Stay (HOSPITAL_COMMUNITY)
Admission: EM | Admit: 2018-11-03 | Discharge: 2018-11-08 | DRG: 480 | Disposition: A | Discharge status: Home Health | Payer: Medicare Other | Attending: Family Medicine | Admitting: Family Medicine

## 2018-11-03 ENCOUNTER — Inpatient Hospital Stay (HOSPITAL_COMMUNITY): Payer: Medicare Other

## 2018-11-03 DIAGNOSIS — S72402A Unspecified fracture of lower end of left femur, initial encounter for closed fracture: Secondary | ICD-10-CM | POA: Diagnosis not present

## 2018-11-03 DIAGNOSIS — Y92000 Kitchen of unspecified non-institutional (private) residence as  the place of occurrence of the external cause: Secondary | ICD-10-CM | POA: Diagnosis not present

## 2018-11-03 DIAGNOSIS — R7989 Other specified abnormal findings of blood chemistry: Secondary | ICD-10-CM | POA: Diagnosis not present

## 2018-11-03 DIAGNOSIS — E1165 Type 2 diabetes mellitus with hyperglycemia: Secondary | ICD-10-CM | POA: Diagnosis not present

## 2018-11-03 DIAGNOSIS — I480 Paroxysmal atrial fibrillation: Secondary | ICD-10-CM | POA: Diagnosis present

## 2018-11-03 DIAGNOSIS — D62 Acute posthemorrhagic anemia: Secondary | ICD-10-CM | POA: Diagnosis not present

## 2018-11-03 DIAGNOSIS — Z7982 Long term (current) use of aspirin: Secondary | ICD-10-CM

## 2018-11-03 DIAGNOSIS — E119 Type 2 diabetes mellitus without complications: Secondary | ICD-10-CM | POA: Diagnosis not present

## 2018-11-03 DIAGNOSIS — S299XXA Unspecified injury of thorax, initial encounter: Secondary | ICD-10-CM | POA: Diagnosis not present

## 2018-11-03 DIAGNOSIS — W19XXXA Unspecified fall, initial encounter: Secondary | ICD-10-CM | POA: Diagnosis present

## 2018-11-03 DIAGNOSIS — S7292XA Unspecified fracture of left femur, initial encounter for closed fracture: Secondary | ICD-10-CM | POA: Diagnosis not present

## 2018-11-03 DIAGNOSIS — Z720 Tobacco use: Secondary | ICD-10-CM | POA: Diagnosis present

## 2018-11-03 DIAGNOSIS — R778 Other specified abnormalities of plasma proteins: Secondary | ICD-10-CM

## 2018-11-03 DIAGNOSIS — I248 Other forms of acute ischemic heart disease: Secondary | ICD-10-CM | POA: Diagnosis not present

## 2018-11-03 DIAGNOSIS — J9601 Acute respiratory failure with hypoxia: Secondary | ICD-10-CM | POA: Diagnosis not present

## 2018-11-03 DIAGNOSIS — J45909 Unspecified asthma, uncomplicated: Secondary | ICD-10-CM | POA: Diagnosis present

## 2018-11-03 DIAGNOSIS — S72492A Other fracture of lower end of left femur, initial encounter for closed fracture: Secondary | ICD-10-CM | POA: Diagnosis not present

## 2018-11-03 DIAGNOSIS — I2510 Atherosclerotic heart disease of native coronary artery without angina pectoris: Secondary | ICD-10-CM | POA: Diagnosis present

## 2018-11-03 DIAGNOSIS — Z1159 Encounter for screening for other viral diseases: Secondary | ICD-10-CM

## 2018-11-03 DIAGNOSIS — E1110 Type 2 diabetes mellitus with ketoacidosis without coma: Secondary | ICD-10-CM | POA: Diagnosis not present

## 2018-11-03 DIAGNOSIS — E11649 Type 2 diabetes mellitus with hypoglycemia without coma: Secondary | ICD-10-CM | POA: Diagnosis present

## 2018-11-03 DIAGNOSIS — E559 Vitamin D deficiency, unspecified: Secondary | ICD-10-CM | POA: Diagnosis present

## 2018-11-03 DIAGNOSIS — I10 Essential (primary) hypertension: Secondary | ICD-10-CM | POA: Diagnosis present

## 2018-11-03 DIAGNOSIS — S72332A Displaced oblique fracture of shaft of left femur, initial encounter for closed fracture: Secondary | ICD-10-CM | POA: Diagnosis not present

## 2018-11-03 DIAGNOSIS — F17210 Nicotine dependence, cigarettes, uncomplicated: Secondary | ICD-10-CM | POA: Diagnosis present

## 2018-11-03 DIAGNOSIS — R52 Pain, unspecified: Secondary | ICD-10-CM | POA: Diagnosis not present

## 2018-11-03 DIAGNOSIS — S72442A Displaced fracture of lower epiphysis (separation) of left femur, initial encounter for closed fracture: Secondary | ICD-10-CM | POA: Diagnosis not present

## 2018-11-03 DIAGNOSIS — E785 Hyperlipidemia, unspecified: Secondary | ICD-10-CM | POA: Diagnosis present

## 2018-11-03 DIAGNOSIS — R609 Edema, unspecified: Secondary | ICD-10-CM | POA: Diagnosis not present

## 2018-11-03 DIAGNOSIS — S0990XA Unspecified injury of head, initial encounter: Secondary | ICD-10-CM | POA: Diagnosis not present

## 2018-11-03 DIAGNOSIS — Z8249 Family history of ischemic heart disease and other diseases of the circulatory system: Secondary | ICD-10-CM | POA: Diagnosis not present

## 2018-11-03 DIAGNOSIS — S199XXA Unspecified injury of neck, initial encounter: Secondary | ICD-10-CM | POA: Diagnosis not present

## 2018-11-03 DIAGNOSIS — S72422A Displaced fracture of lateral condyle of left femur, initial encounter for closed fracture: Secondary | ICD-10-CM | POA: Diagnosis not present

## 2018-11-03 DIAGNOSIS — M25552 Pain in left hip: Secondary | ICD-10-CM | POA: Diagnosis not present

## 2018-11-03 DIAGNOSIS — Z03818 Encounter for observation for suspected exposure to other biological agents ruled out: Secondary | ICD-10-CM | POA: Diagnosis not present

## 2018-11-03 DIAGNOSIS — Z8673 Personal history of transient ischemic attack (TIA), and cerebral infarction without residual deficits: Secondary | ICD-10-CM

## 2018-11-03 DIAGNOSIS — Z01818 Encounter for other preprocedural examination: Secondary | ICD-10-CM | POA: Diagnosis not present

## 2018-11-03 DIAGNOSIS — H3530 Unspecified macular degeneration: Secondary | ICD-10-CM | POA: Diagnosis present

## 2018-11-03 DIAGNOSIS — Z794 Long term (current) use of insulin: Secondary | ICD-10-CM | POA: Diagnosis not present

## 2018-11-03 DIAGNOSIS — R55 Syncope and collapse: Secondary | ICD-10-CM

## 2018-11-03 DIAGNOSIS — R0781 Pleurodynia: Secondary | ICD-10-CM | POA: Diagnosis not present

## 2018-11-03 DIAGNOSIS — T1490XA Injury, unspecified, initial encounter: Secondary | ICD-10-CM

## 2018-11-03 DIAGNOSIS — S72462A Displaced supracondylar fracture with intracondylar extension of lower end of left femur, initial encounter for closed fracture: Secondary | ICD-10-CM | POA: Diagnosis not present

## 2018-11-03 DIAGNOSIS — S72401A Unspecified fracture of lower end of right femur, initial encounter for closed fracture: Secondary | ICD-10-CM | POA: Diagnosis not present

## 2018-11-03 DIAGNOSIS — S72491A Other fracture of lower end of right femur, initial encounter for closed fracture: Secondary | ICD-10-CM

## 2018-11-03 DIAGNOSIS — M79605 Pain in left leg: Secondary | ICD-10-CM

## 2018-11-03 DIAGNOSIS — S72492D Other fracture of lower end of left femur, subsequent encounter for closed fracture with routine healing: Secondary | ICD-10-CM | POA: Diagnosis not present

## 2018-11-03 LAB — CBC WITH DIFFERENTIAL/PLATELET
Abs Immature Granulocytes: 0.04 10*3/uL (ref 0.00–0.07)
Basophils Absolute: 0 10*3/uL (ref 0.0–0.1)
Basophils Relative: 0 %
Eosinophils Absolute: 0 10*3/uL (ref 0.0–0.5)
Eosinophils Relative: 0 %
HCT: 40 % (ref 36.0–46.0)
Hemoglobin: 12.7 g/dL (ref 12.0–15.0)
Immature Granulocytes: 0 %
Lymphocytes Relative: 6 %
Lymphs Abs: 0.9 10*3/uL (ref 0.7–4.0)
MCH: 29.7 pg (ref 26.0–34.0)
MCHC: 31.8 g/dL (ref 30.0–36.0)
MCV: 93.5 fL (ref 80.0–100.0)
Monocytes Absolute: 0.9 10*3/uL (ref 0.1–1.0)
Monocytes Relative: 6 %
Neutro Abs: 11.9 10*3/uL — ABNORMAL HIGH (ref 1.7–7.7)
Neutrophils Relative %: 88 %
Platelets: 254 10*3/uL (ref 150–400)
RBC: 4.28 MIL/uL (ref 3.87–5.11)
RDW: 12.8 % (ref 11.5–15.5)
WBC: 13.8 10*3/uL — ABNORMAL HIGH (ref 4.0–10.5)
nRBC: 0 % (ref 0.0–0.2)

## 2018-11-03 LAB — COMPREHENSIVE METABOLIC PANEL
ALT: 17 U/L (ref 0–44)
AST: 28 U/L (ref 15–41)
Albumin: 4 g/dL (ref 3.5–5.0)
Alkaline Phosphatase: 61 U/L (ref 38–126)
Anion gap: 13 (ref 5–15)
BUN: 16 mg/dL (ref 8–23)
CO2: 23 mmol/L (ref 22–32)
Calcium: 9 mg/dL (ref 8.9–10.3)
Chloride: 101 mmol/L (ref 98–111)
Creatinine, Ser: 0.94 mg/dL (ref 0.44–1.00)
GFR calc Af Amer: >60 mL/min (ref >60)
GFR calc non Af Amer: 59 mL/min — ABNORMAL LOW (ref >60)
Glucose, Bld: 83 mg/dL (ref 70–99)
Potassium: 3.7 mmol/L (ref 3.5–5.1)
Sodium: 137 mmol/L (ref 135–145)
Total Bilirubin: 0.5 mg/dL (ref 0.3–1.2)
Total Protein: 6.9 g/dL (ref 6.5–8.1)

## 2018-11-03 LAB — RAPID URINE DRUG SCREEN, HOSP PERFORMED
Amphetamines: NOT DETECTED
Barbiturates: NOT DETECTED
Benzodiazepines: POSITIVE — AB
Cocaine: NOT DETECTED
Opiates: NOT DETECTED
Tetrahydrocannabinol: NOT DETECTED

## 2018-11-03 LAB — URINALYSIS, ROUTINE W REFLEX MICROSCOPIC
Bilirubin Urine: NEGATIVE
Glucose, UA: NEGATIVE mg/dL
Hgb urine dipstick: NEGATIVE
Ketones, ur: 5 mg/dL — AB
Nitrite: NEGATIVE
Protein, ur: NEGATIVE mg/dL
Specific Gravity, Urine: 1.016 (ref 1.005–1.030)
pH: 5 (ref 5.0–8.0)

## 2018-11-03 LAB — CK: Total CK: 140 U/L (ref 38–234)

## 2018-11-03 LAB — LACTIC ACID, PLASMA
Lactic Acid, Venous: 2 mmol/L (ref 0.5–1.9)
Lactic Acid, Venous: 1.1 mmol/L (ref 0.5–1.9)

## 2018-11-03 LAB — PROTIME-INR
INR: 1 (ref 0.8–1.2)
Prothrombin Time: 12.6 seconds (ref 11.4–15.2)

## 2018-11-03 LAB — TROPONIN I
Troponin I: 0.46 ng/mL (ref <0.03)
Troponin I: 0.45 ng/mL (ref <0.03)

## 2018-11-03 LAB — SARS CORONAVIRUS 2 BY RT PCR (HOSPITAL ORDER, PERFORMED IN ~~LOC~~ HOSPITAL LAB): SARS Coronavirus 2: NEGATIVE

## 2018-11-03 LAB — ETHANOL: Alcohol, Ethyl (B): <10 mg/dL (ref <10)

## 2018-11-03 MED ORDER — METHOCARBAMOL 500 MG PO TABS
500.0000 mg | ORAL_TABLET | Freq: Four times a day (QID) | ORAL | Status: DC | PRN
  Administered 2018-11-04 – 2018-11-06 (×3): 500 mg via ORAL
  Filled 2018-11-03 (×3): qty 1

## 2018-11-03 MED ORDER — BISACODYL 10 MG RE SUPP: 10.0000 mg | Freq: Every day | RECTAL | Status: DC | PRN

## 2018-11-03 MED ORDER — HYDROMORPHONE HCL 1 MG/ML IJ SOLN
1.0000 mg | INTRAMUSCULAR | Status: AC | PRN
  Administered 2018-11-03 (×2): 1 mg via INTRAVENOUS
  Filled 2018-11-03 (×2): qty 1

## 2018-11-03 MED ORDER — MAGNESIUM CITRATE PO SOLN
1.0000 | Freq: Once | ORAL | Status: DC | PRN
  Filled 2018-11-03: qty 296

## 2018-11-03 MED ORDER — SODIUM CHLORIDE 0.9 % IV SOLN
INTRAVENOUS | Status: DC
  Administered 2018-11-03: 19:00:00 via INTRAVENOUS

## 2018-11-03 MED ORDER — PROCHLORPERAZINE EDISYLATE 10 MG/2ML IJ SOLN
5.0000 mg | INTRAMUSCULAR | Status: DC | PRN
  Filled 2018-11-03: qty 1

## 2018-11-03 MED ORDER — HYDROCODONE-ACETAMINOPHEN 5-325 MG PO TABS
1.0000 | ORAL_TABLET | ORAL | Status: DC | PRN
  Administered 2018-11-04 (×2): 1 via ORAL
  Filled 2018-11-03 (×3): qty 1

## 2018-11-03 MED ORDER — SENNOSIDES-DOCUSATE SODIUM 8.6-50 MG PO TABS
1.0000 | ORAL_TABLET | Freq: Two times a day (BID) | ORAL | Status: DC
  Administered 2018-11-04 – 2018-11-08 (×9): 1 via ORAL
  Filled 2018-11-03 (×15): qty 1

## 2018-11-03 MED ORDER — HYDROMORPHONE HCL 1 MG/ML IJ SOLN
0.5000 mg | INTRAMUSCULAR | Status: DC | PRN
  Administered 2018-11-04 (×2): 0.5 mg via INTRAVENOUS
  Filled 2018-11-03 (×2): qty 0.5

## 2018-11-03 MED ORDER — METHOCARBAMOL 1000 MG/10ML IJ SOLN
500.0000 mg | Freq: Four times a day (QID) | INTRAVENOUS | Status: DC | PRN
  Administered 2018-11-04: 500 mg via INTRAVENOUS
  Filled 2018-11-03: qty 5
  Filled 2018-11-03: qty 500

## 2018-11-03 NOTE — ED Notes (Signed)
Call from lab   Critical value   Lactic Acid 2.0
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
## 2018-11-03 NOTE — ED Notes (Signed)
CRITICAL VALUE ALERT  Critical Value:  tropnin 0.46  Date & Time Notied:  0990 11/03/18  Provider Notified: [email or web address]  Orders Received/Actions taken:

## 2018-11-03 NOTE — Consult Note (Signed)
Ortho Trauma Note  Case reviewed with Dr. Thurnell Garbe. Patient with syncope and fell. Complained of left hip and knee pain. X-rays showed a left distal femur fracture. Due to medical complexity recommending transfer to [facility].  CT scan of left knee order to evaluate for intra-articular extension. Will plan for tenative surgery tomorrow for ORIF. Please make NPO past midnight. I appreciate hospitalists optimization. Formal consult to follow tomorrow AM.  Shona Needles, MD [facility] [phone] (phone) [phone] (office) [email or web address]
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
## 2018-11-03 NOTE — ED Notes (Signed)
Continues in Rad 

## 2018-11-03 NOTE — ED Notes (Signed)
Pt is currently sleeping with knee immobilizer in place   Her facial features are relaxed    Dr Jenetta Downer has evaluated and is given numbers of daughter

## 2018-11-03 NOTE — ED Notes (Signed)
Report to April, RN

## 2018-11-03 NOTE — ED Notes (Signed)
Report to [facility] by Hassan Rowan, RN, CN

## 2018-11-03 NOTE — ED Notes (Signed)
Dr Olevia Bowens in to assess
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
## 2018-11-03 NOTE — ED Notes (Signed)
Pt reported unpon arrival to room that she passed out-  Then stated she lived with spouse Pt yelling crying that she is in pain  When asked if husband did not hear her, pt does not respond  She cries out and is holding her R leg stating that it hurts it hurts  She also states that she has not taken any meds, nor had anything that would make her dizzy or not remember events

## 2018-11-03 NOTE — ED Notes (Signed)
To Rad 

## 2018-11-03 NOTE — ED Notes (Signed)
Spoke with Hattie Perch from [place]

## 2018-11-03 NOTE — ED Notes (Signed)
Covid collected   To lab

## 2018-11-03 NOTE — ED Notes (Signed)
From Rad 

## 2018-11-03 NOTE — ED Notes (Signed)
Critical troponin 0.45
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
## 2018-11-03 NOTE — ED Notes (Signed)
Dr Olevia Bowens om phone w daughter Ms Langston Masker [phone]

## 2018-11-03 NOTE — ED Notes (Signed)
Pt on phone w daughter

## 2018-11-03 NOTE — ED Notes (Signed)
From CT 

## 2018-11-03 NOTE — ED Triage Notes (Addendum)
Pt "fell against refrigerator this am possiblY and laid in floor since 8 am due to pain in left leg. Pt and husband had conflicting stories PT has pain from left hip to below left knee. 50 mcg fentanyl with no relief

## 2018-11-04 ENCOUNTER — Inpatient Hospital Stay (HOSPITAL_COMMUNITY): Payer: Medicare Other | Admitting: Certified Registered"

## 2018-11-04 ENCOUNTER — Inpatient Hospital Stay (HOSPITAL_COMMUNITY): Payer: Medicare Other

## 2018-11-04 ENCOUNTER — Encounter (HOSPITAL_COMMUNITY)
Admission: EM | Disposition: A | Discharge status: Home Health | Payer: Self-pay | Source: Home / Self Care | Attending: Family Medicine

## 2018-11-04 ENCOUNTER — Other Ambulatory Visit: Payer: Self-pay

## 2018-11-04 DIAGNOSIS — Z01818 Encounter for other preprocedural examination: Secondary | ICD-10-CM

## 2018-11-04 DIAGNOSIS — I10 Essential (primary) hypertension: Secondary | ICD-10-CM

## 2018-11-04 DIAGNOSIS — E785 Hyperlipidemia, unspecified: Secondary | ICD-10-CM

## 2018-11-04 DIAGNOSIS — R7989 Other specified abnormal findings of blood chemistry: Secondary | ICD-10-CM

## 2018-11-04 DIAGNOSIS — I480 Paroxysmal atrial fibrillation: Secondary | ICD-10-CM

## 2018-11-04 DIAGNOSIS — E119 Type 2 diabetes mellitus without complications: Secondary | ICD-10-CM

## 2018-11-04 HISTORY — PX: ORIF FEMUR FRACTURE: SHX2119

## 2018-11-04 LAB — GLUCOSE, CAPILLARY
Glucose-Capillary: 44 mg/dL — CL (ref 70–99)
Glucose-Capillary: 99 mg/dL (ref 70–99)
Glucose-Capillary: 84 mg/dL (ref 70–99)
Glucose-Capillary: 49 mg/dL — ABNORMAL LOW (ref 70–99)
Glucose-Capillary: 165 mg/dL — ABNORMAL HIGH (ref 70–99)
Glucose-Capillary: 106 mg/dL — ABNORMAL HIGH (ref 70–99)

## 2018-11-04 LAB — SURGICAL PCR SCREEN
MRSA, PCR: NEGATIVE
Staphylococcus aureus: NEGATIVE

## 2018-11-04 LAB — TROPONIN I: Troponin I: 0.85 ng/mL (ref <0.03)

## 2018-11-04 LAB — ECHOCARDIOGRAM COMPLETE
Height: 64 in
Weight: 2151.69 oz

## 2018-11-04 SURGERY — OPEN REDUCTION INTERNAL FIXATION (ORIF) DISTAL FEMUR FRACTURE
Anesthesia: General | Site: Leg Upper | Laterality: Left

## 2018-11-04 MED ORDER — CEFAZOLIN SODIUM-DEXTROSE 2-3 GM-%(50ML) IV SOLR
INTRAVENOUS | Status: DC | PRN
  Administered 2018-11-04: 2 g via INTRAVENOUS

## 2018-11-04 MED ORDER — ROCURONIUM BROMIDE 50 MG/5ML IV SOSY
PREFILLED_SYRINGE | INTRAVENOUS | Status: DC | PRN
  Administered 2018-11-04: 50 mg via INTRAVENOUS

## 2018-11-04 MED ORDER — INSULIN GLARGINE 100 UNIT/ML ~~LOC~~ SOLN
9.0000 [IU] | Freq: Every day | SUBCUTANEOUS | Status: DC
  Administered 2018-11-05: 9 [IU] via SUBCUTANEOUS
  Filled 2018-11-04 (×4): qty 0.09

## 2018-11-04 MED ORDER — SODIUM CHLORIDE 0.9 % IV SOLN
INTRAVENOUS | Status: DC | PRN
  Administered 2018-11-04: 30 ug/min via INTRAVENOUS

## 2018-11-04 MED ORDER — ACETAMINOPHEN 325 MG PO TABS
650.0000 mg | ORAL_TABLET | Freq: Four times a day (QID) | ORAL | Status: DC
  Administered 2018-11-04 – 2018-11-08 (×13): 650 mg via ORAL
  Filled 2018-11-04 (×13): qty 2

## 2018-11-04 MED ORDER — POLYVINYL ALCOHOL 1.4 % OP SOLN
1.0000 [drp] | Freq: Two times a day (BID) | OPHTHALMIC | Status: DC
  Administered 2018-11-04 – 2018-11-08 (×10): 1 [drp] via OPHTHALMIC
  Filled 2018-11-04: qty 15

## 2018-11-04 MED ORDER — EPHEDRINE 5 MG/ML INJ
INTRAVENOUS | Status: AC
  Filled 2018-11-04: qty 10

## 2018-11-04 MED ORDER — FENTANYL CITRATE (PF) 100 MCG/2ML IJ SOLN
INTRAMUSCULAR | Status: DC | PRN
  Administered 2018-11-04 (×3): 50 ug via INTRAVENOUS

## 2018-11-04 MED ORDER — POLYETHYLENE GLYCOL 3350 17 G PO PACK
17.0000 g | PACK | Freq: Every day | ORAL | Status: DC
  Administered 2018-11-04 – 2018-11-06 (×3): 17 g via ORAL
  Filled 2018-11-04 (×3): qty 1

## 2018-11-04 MED ORDER — PHENYLEPHRINE 40 MCG/ML (10ML) SYRINGE FOR IV PUSH (FOR BLOOD PRESSURE SUPPORT)
PREFILLED_SYRINGE | INTRAVENOUS | Status: DC | PRN
  Administered 2018-11-04: 80 ug via INTRAVENOUS
  Administered 2018-11-04: 120 ug via INTRAVENOUS
  Administered 2018-11-04: 80 ug via INTRAVENOUS
  Administered 2018-11-04: 120 ug via INTRAVENOUS

## 2018-11-04 MED ORDER — EPHEDRINE SULFATE-NACL 50-0.9 MG/10ML-% IV SOSY
PREFILLED_SYRINGE | INTRAVENOUS | Status: DC | PRN
  Administered 2018-11-04: 10 mg via INTRAVENOUS

## 2018-11-04 MED ORDER — PHENYLEPHRINE 40 MCG/ML (10ML) SYRINGE FOR IV PUSH (FOR BLOOD PRESSURE SUPPORT)
PREFILLED_SYRINGE | INTRAVENOUS | Status: AC
  Filled 2018-11-04: qty 10

## 2018-11-04 MED ORDER — DEXTROSE 50 % IV SOLN
25.0000 mL | Freq: Once | INTRAVENOUS | Status: AC
  Administered 2018-11-04: 25 mL via INTRAVENOUS

## 2018-11-04 MED ORDER — FENTANYL CITRATE (PF) 100 MCG/2ML IJ SOLN
25.0000 ug | INTRAMUSCULAR | Status: AC | PRN
  Administered 2018-11-04 (×2): 25 ug via INTRAVENOUS

## 2018-11-04 MED ORDER — DEXAMETHASONE SODIUM PHOSPHATE 10 MG/ML IJ SOLN
INTRAMUSCULAR | Status: DC | PRN
  Administered 2018-11-04: 10 mg via INTRAVENOUS

## 2018-11-04 MED ORDER — ONDANSETRON HCL 4 MG/2ML IJ SOLN
INTRAMUSCULAR | Status: DC | PRN
  Administered 2018-11-04: 4 mg via INTRAVENOUS

## 2018-11-04 MED ORDER — ASPIRIN 325 MG PO TABS: 162.5000 mg | ORAL_TABLET | Freq: Every day | ORAL | Status: DC

## 2018-11-04 MED ORDER — DEXAMETHASONE SODIUM PHOSPHATE 10 MG/ML IJ SOLN
INTRAMUSCULAR | Status: AC
  Filled 2018-11-04: qty 1

## 2018-11-04 MED ORDER — HYDROMORPHONE HCL 1 MG/ML IJ SOLN
1.0000 mg | INTRAMUSCULAR | Status: DC | PRN
  Administered 2018-11-04 – 2018-11-07 (×5): 1 mg via INTRAVENOUS
  Filled 2018-11-04 (×5): qty 1

## 2018-11-04 MED ORDER — FENTANYL CITRATE (PF) 100 MCG/2ML IJ SOLN
INTRAMUSCULAR | Status: AC
  Filled 2018-11-04: qty 2

## 2018-11-04 MED ORDER — PROPOFOL 10 MG/ML IV BOLUS
INTRAVENOUS | Status: DC | PRN
  Administered 2018-11-04: 100 mg via INTRAVENOUS

## 2018-11-04 MED ORDER — FENTANYL CITRATE (PF) 100 MCG/2ML IJ SOLN
25.0000 ug | INTRAMUSCULAR | Status: DC | PRN
  Administered 2018-11-04: 50 ug via INTRAVENOUS

## 2018-11-04 MED ORDER — NITROGLYCERIN 0.4 MG SL SUBL: 0.4000 mg | SUBLINGUAL_TABLET | SUBLINGUAL | Status: DC | PRN

## 2018-11-04 MED ORDER — SUCCINYLCHOLINE CHLORIDE 200 MG/10ML IV SOSY
PREFILLED_SYRINGE | INTRAVENOUS | Status: AC
  Filled 2018-11-04: qty 10

## 2018-11-04 MED ORDER — BACITRACIN ZINC 500 UNIT/GM EX OINT
TOPICAL_OINTMENT | CUTANEOUS | Status: AC
  Filled 2018-11-04: qty 28.35

## 2018-11-04 MED ORDER — METOPROLOL TARTRATE 12.5 MG HALF TABLET
12.5000 mg | ORAL_TABLET | Freq: Two times a day (BID) | ORAL | Status: DC
  Administered 2018-11-04: 12.5 mg via ORAL
  Filled 2018-11-04: qty 1

## 2018-11-04 MED ORDER — ROCURONIUM BROMIDE 10 MG/ML (PF) SYRINGE
PREFILLED_SYRINGE | INTRAVENOUS | Status: AC
  Filled 2018-11-04: qty 10

## 2018-11-04 MED ORDER — VITAMIN D 25 MCG (1000 UNIT) PO TABS
1000.0000 [IU] | ORAL_TABLET | Freq: Every morning | ORAL | Status: DC
  Administered 2018-11-04: 1000 [IU] via ORAL
  Filled 2018-11-04: qty 1

## 2018-11-04 MED ORDER — METOPROLOL TARTRATE 25 MG PO TABS
25.0000 mg | ORAL_TABLET | Freq: Two times a day (BID) | ORAL | Status: DC
  Administered 2018-11-04 – 2018-11-08 (×6): 25 mg via ORAL
  Filled 2018-11-04 (×8): qty 1

## 2018-11-04 MED ORDER — FENTANYL CITRATE (PF) 100 MCG/2ML IJ SOLN
INTRAMUSCULAR | Status: AC
  Administered 2018-11-04: 25 ug via INTRAVENOUS
  Filled 2018-11-04: qty 2

## 2018-11-04 MED ORDER — SUGAMMADEX SODIUM 200 MG/2ML IV SOLN
INTRAVENOUS | Status: DC | PRN
  Administered 2018-11-04: 150 mg via INTRAVENOUS

## 2018-11-04 MED ORDER — VANCOMYCIN HCL 1000 MG IV SOLR
INTRAVENOUS | Status: AC
  Filled 2018-11-04: qty 1000

## 2018-11-04 MED ORDER — ADULT MULTIVITAMIN W/MINERALS CH
1.0000 | ORAL_TABLET | Freq: Every day | ORAL | Status: DC
  Administered 2018-11-05 – 2018-11-08 (×4): 1 via ORAL
  Filled 2018-11-04 (×4): qty 1

## 2018-11-04 MED ORDER — ONDANSETRON HCL 4 MG/2ML IJ SOLN
INTRAMUSCULAR | Status: AC
  Filled 2018-11-04: qty 2

## 2018-11-04 MED ORDER — LIDOCAINE 2% (20 MG/ML) 5 ML SYRINGE
INTRAMUSCULAR | Status: AC
  Filled 2018-11-04: qty 5

## 2018-11-04 MED ORDER — DEXTROSE 50 % IV SOLN
INTRAVENOUS | Status: AC
  Filled 2018-11-04: qty 50

## 2018-11-04 MED ORDER — MUPIROCIN 2 % EX OINT
1.0000 "application " | TOPICAL_OINTMENT | Freq: Two times a day (BID) | CUTANEOUS | Status: DC
  Administered 2018-11-04 – 2018-11-08 (×9): 1 via NASAL
  Filled 2018-11-04 (×3): qty 22

## 2018-11-04 MED ORDER — ENOXAPARIN SODIUM 40 MG/0.4ML ~~LOC~~ SOLN
40.0000 mg | SUBCUTANEOUS | Status: DC
  Administered 2018-11-05 – 2018-11-08 (×4): 40 mg via SUBCUTANEOUS
  Filled 2018-11-04 (×4): qty 0.4

## 2018-11-04 MED ORDER — PROPOFOL 10 MG/ML IV BOLUS
INTRAVENOUS | Status: AC
  Filled 2018-11-04: qty 20

## 2018-11-04 MED ORDER — 0.9 % SODIUM CHLORIDE (POUR BTL) OPTIME
TOPICAL | Status: DC | PRN
  Administered 2018-11-04: 1000 mL

## 2018-11-04 MED ORDER — INSULIN ASPART 100 UNIT/ML ~~LOC~~ SOLN
0.0000 [IU] | SUBCUTANEOUS | Status: DC
  Administered 2018-11-05: 5 [IU] via SUBCUTANEOUS
  Administered 2018-11-05: 2 [IU] via SUBCUTANEOUS

## 2018-11-04 MED ORDER — CEFAZOLIN SODIUM-DEXTROSE 2-4 GM/100ML-% IV SOLN
INTRAVENOUS | Status: AC
  Administered 2018-11-05: 2 g via INTRAVENOUS
  Filled 2018-11-04: qty 100

## 2018-11-04 MED ORDER — FENTANYL CITRATE (PF) 100 MCG/2ML IJ SOLN
25.0000 ug | Freq: Once | INTRAMUSCULAR | Status: AC
  Administered 2018-11-04: 25 ug via INTRAVENOUS

## 2018-11-04 MED ORDER — CEFAZOLIN SODIUM-DEXTROSE 2-4 GM/100ML-% IV SOLN
2.0000 g | Freq: Three times a day (TID) | INTRAVENOUS | Status: AC
  Administered 2018-11-04 – 2018-11-05 (×3): 2 g via INTRAVENOUS
  Filled 2018-11-04 (×4): qty 100

## 2018-11-04 MED ORDER — FLECAINIDE ACETATE 100 MG PO TABS
100.0000 mg | ORAL_TABLET | Freq: Two times a day (BID) | ORAL | Status: DC
  Administered 2018-11-04 – 2018-11-08 (×10): 100 mg via ORAL
  Filled 2018-11-04 (×11): qty 1

## 2018-11-04 MED ORDER — LACTATED RINGERS IV SOLN
INTRAVENOUS | Status: DC
  Administered 2018-11-04 (×2): via INTRAVENOUS

## 2018-11-04 MED ORDER — ENSURE ENLIVE PO LIQD
237.0000 mL | Freq: Two times a day (BID) | ORAL | Status: DC
  Administered 2018-11-05 – 2018-11-08 (×6): 237 mL via ORAL

## 2018-11-04 MED ORDER — LIDOCAINE 2% (20 MG/ML) 5 ML SYRINGE
INTRAMUSCULAR | Status: DC | PRN
  Administered 2018-11-04: 40 mg via INTRAVENOUS

## 2018-11-04 MED ORDER — FENTANYL CITRATE (PF) 250 MCG/5ML IJ SOLN
INTRAMUSCULAR | Status: AC
  Filled 2018-11-04: qty 5

## 2018-11-04 MED ORDER — OXYCODONE HCL 5 MG PO TABS
5.0000 mg | ORAL_TABLET | ORAL | Status: DC | PRN
  Administered 2018-11-04 – 2018-11-06 (×9): 10 mg via ORAL
  Administered 2018-11-07 (×2): 5 mg via ORAL
  Administered 2018-11-08 (×3): 10 mg via ORAL
  Filled 2018-11-04 (×4): qty 2
  Filled 2018-11-04: qty 1
  Filled 2018-11-04 (×6): qty 2
  Filled 2018-11-04: qty 1
  Filled 2018-11-04 (×2): qty 2

## 2018-11-04 MED ORDER — BRIMONIDINE TARTRATE 0.2 % OP SOLN
1.0000 [drp] | Freq: Three times a day (TID) | OPHTHALMIC | Status: DC
  Administered 2018-11-04 – 2018-11-08 (×12): 1 [drp] via OPHTHALMIC
  Filled 2018-11-04: qty 5

## 2018-11-04 MED ORDER — GABAPENTIN 100 MG PO CAPS
100.0000 mg | ORAL_CAPSULE | Freq: Three times a day (TID) | ORAL | Status: DC
  Administered 2018-11-04 – 2018-11-08 (×11): 100 mg via ORAL
  Filled 2018-11-04 (×11): qty 1

## 2018-11-04 MED ORDER — VANCOMYCIN HCL 1000 MG IV SOLR
INTRAVENOUS | Status: DC | PRN
  Administered 2018-11-04: 1000 mg via TOPICAL

## 2018-11-04 SURGICAL SUPPLY — 81 items
BIT DRILL 100X2.5XANTM LCK (BIT) ×1 IMPLANT
BIT DRILL 4.3 (BIT) ×2
BIT DRILL 4.3X300MM (BIT) ×1 IMPLANT
BIT DRILL GOLD 3.5MM (BIT) ×1 IMPLANT
BIT DRILL LONG 3.3 (BIT) ×2 IMPLANT
BIT DRILL QC 3.3X195 (BIT) ×2 IMPLANT
BIT DRL 100X2.5XANTM LCK (BIT) ×1
BLADE CLIPPER SURG (BLADE) IMPLANT
BNDG COHESIVE 6X5 TAN STRL LF (GAUZE/BANDAGES/DRESSINGS) ×2 IMPLANT
BNDG ELASTIC 6X10 VLCR STRL LF (GAUZE/BANDAGES/DRESSINGS) IMPLANT
BRUSH SCRUB SURG 4.25 DISP (MISCELLANEOUS) ×2 IMPLANT
CANISTER SUCT 3000ML PPV (MISCELLANEOUS) IMPLANT
CAP LOCK NCB (Cap) ×16 IMPLANT
CHLORAPREP W/TINT 26ML (MISCELLANEOUS) ×4 IMPLANT
COVER SURGICAL LIGHT HANDLE (MISCELLANEOUS) ×4 IMPLANT
COVER WAND RF STERILE (DRAPES) IMPLANT
DRAPE C-ARM 42X72 X-RAY (DRAPES) ×2 IMPLANT
DRAPE C-ARMOR (DRAPES) ×2 IMPLANT
DRAPE ORTHO SPLIT 77X108 STRL (DRAPES) ×2
DRAPE PROXIMA HALF (DRAPES) ×4 IMPLANT
DRAPE SURG 17X23 STRL (DRAPES) IMPLANT
DRAPE SURG ORHT 6 SPLT 77X108 (DRAPES) ×2 IMPLANT
DRAPE U-SHAPE 47X51 STRL (DRAPES) ×2 IMPLANT
DRILL BIT 2.5MM (BIT) ×1
DRILL GOLD 3.5MM (BIT) ×2
DRSG ADAPTIC 3X8 NADH LF (GAUZE/BANDAGES/DRESSINGS) IMPLANT
DRSG MEPILEX BORDER 4X12 (GAUZE/BANDAGES/DRESSINGS) IMPLANT
DRSG MEPILEX BORDER 4X4 (GAUZE/BANDAGES/DRESSINGS) ×2 IMPLANT
DRSG MEPILEX BORDER 4X8 (GAUZE/BANDAGES/DRESSINGS) ×2 IMPLANT
DRSG PAD ABDOMINAL 8X10 ST (GAUZE/BANDAGES/DRESSINGS) IMPLANT
ELECT REM PT RETURN 9FT ADLT (ELECTROSURGICAL) ×2
ELECTRODE REM PT RTRN 9FT ADLT (ELECTROSURGICAL) ×1 IMPLANT
GAUZE SPONGE 4X4 12PLY STRL (GAUZE/BANDAGES/DRESSINGS) IMPLANT
GLOVE BIO SURGEON STRL SZ 6.5 (GLOVE) IMPLANT
GLOVE BIO SURGEON STRL SZ7.5 (GLOVE) IMPLANT
GLOVE BIOGEL PI IND STRL 6.5 (GLOVE) ×1 IMPLANT
GLOVE BIOGEL PI IND STRL 7.5 (GLOVE) ×1 IMPLANT
GLOVE BIOGEL PI INDICATOR 6.5 (GLOVE) ×1
GLOVE BIOGEL PI INDICATOR 7.5 (GLOVE) ×1
GLOVE SKINSENSE NS SZ6.5 (GLOVE) ×3
GLOVE SKINSENSE NS SZ7.0 (GLOVE) ×3
GLOVE SKINSENSE NS SZ7.5 (GLOVE) ×4
GLOVE SKINSENSE STRL SZ6.5 (GLOVE) ×3 IMPLANT
GLOVE SKINSENSE STRL SZ7.0 (GLOVE) ×3 IMPLANT
GLOVE SKINSENSE STRL SZ7.5 (GLOVE) ×4 IMPLANT
GOWN STRL REUS W/ TWL LRG LVL3 (GOWN DISPOSABLE) ×4 IMPLANT
GOWN STRL REUS W/TWL LRG LVL3 (GOWN DISPOSABLE) ×4
K-WIRE 2.0 (WIRE) ×2
K-WIRE FXSTD 280X2XNS SS (WIRE) ×2
KIT BASIN OR (CUSTOM PROCEDURE TRAY) ×2 IMPLANT
KIT TURNOVER KIT B (KITS) ×2 IMPLANT
KWIRE FXSTD 280X2XNS SS (WIRE) ×2 IMPLANT
NS IRRIG 1000ML POUR BTL (IV SOLUTION) ×2 IMPLANT
PACK TOTAL JOINT (CUSTOM PROCEDURE TRAY) ×2 IMPLANT
PAD ARMBOARD 7.5X6 YLW CONV (MISCELLANEOUS) ×6 IMPLANT
PAD CAST 4YDX4 CTTN HI CHSV (CAST SUPPLIES) IMPLANT
PADDING CAST COTTON 4X4 STRL (CAST SUPPLIES)
PADDING CAST COTTON 6X4 STRL (CAST SUPPLIES) IMPLANT
PLATE DIST FEM 12H (Plate) ×2 IMPLANT
SCREW 5.0 70MM (Screw) ×6 IMPLANT
SCREW LP 3.5X60MM (Screw) ×2 IMPLANT
SCREW LP 3.5X75MM (Screw) ×2 IMPLANT
SCREW NCB 3.5X75X5X6.2XST (Screw) ×3 IMPLANT
SCREW NCB 4.0 32MM (Screw) ×2 IMPLANT
SCREW NCB 5.0X34MM (Screw) ×2 IMPLANT
SCREW NCB 5.0X36MM (Screw) ×2 IMPLANT
SCREW NCB 5.0X75MM (Screw) ×3 IMPLANT
SPONGE LAP 18X18 RF (DISPOSABLE) ×2 IMPLANT
STAPLER VISISTAT 35W (STAPLE) ×2 IMPLANT
SUCTION FRAZIER HANDLE 10FR (MISCELLANEOUS) ×1
SUCTION TUBE FRAZIER 10FR DISP (MISCELLANEOUS) ×1 IMPLANT
SUT ETHILON 3 0 PS 1 (SUTURE) ×4 IMPLANT
SUT VIC AB 0 CT1 27 (SUTURE) ×1
SUT VIC AB 0 CT1 27XBRD ANBCTR (SUTURE) ×1 IMPLANT
SUT VIC AB 1 CT1 27 (SUTURE)
SUT VIC AB 1 CT1 27XBRD ANBCTR (SUTURE) IMPLANT
SUT VIC AB 2-0 CT1 27 (SUTURE) ×2
SUT VIC AB 2-0 CT1 TAPERPNT 27 (SUTURE) ×2 IMPLANT
TOWEL OR 17X26 10 PK STRL BLUE (TOWEL DISPOSABLE) ×2 IMPLANT
TRAY FOLEY MTR SLVR 16FR STAT (SET/KITS/TRAYS/PACK) IMPLANT
WATER STERILE IRR 1000ML POUR (IV SOLUTION) ×2 IMPLANT

## 2018-11-04 NOTE — Progress Notes (Signed)
Hypoglycemic Event  CBG: 30  @0902  CBG   44 @0904   Treatment: 50 % Dextrose 25cc  Symptoms: none  Follow-up CBG: [id number] CBG Result:99  Possible Reasons for Event: NPO  Comments/MD notified:Dr Lynnae January Lavada Langsam RN BSN PCCN

## 2018-11-04 NOTE — Progress Notes (Addendum)
Patient oxygen levels dropping when asleep sats 81% restarted oxygen at 2l

## 2018-11-04 NOTE — Consult Note (Addendum)
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 motion abnormalities.    On exam, no JVD.  Regular S1S2 no murmur. No edema.    1. Pre-operative evaluation, left femur fracture: At baseline, no chest pain/dyspnea.  She can walk up a flight of stairs with no problems.  She had a fall with femur fracture; she does not think that she passed out.  No palpitations or chest pain.  She was down for 8+ hours.  Her troponin was very mildly elevated with no trend.  Echo was done and reviewed, EF normal with no regional wall motion abnormalities.  ECG with NSR, nonspecific changes not markedly different from the past.   - Surgery is necessary, and I do not think that it needs to be delayed for further cardiac workup.  She is at moderate risk, I would say, for  cardiac complications. She will get metoprolol per-operatively, I will increase to 25 mg bid.  2. Elevated troponin: No chest pain, no definite symptoms of arrhythmia.  She does not think that she passed out.  I do not think we can fully rule out arrhythmia with syncope/fall but she has no recollection of this happening.  This could certainly explain the mild troponin rise. It is also possible that as she was down on the floor for 8 hrs she developed hypotension/dehydration with demand ischemia. Echo with no regional wall motion abnormalities.  - Continue ASA and metoprolol.  - Would plan Cardiolite eventually but do not think we need to delay surgery for it.  3. Atrial fibrillation: Paroxysmal.  She has not been anticoagulated due to recurrent GI bleeding.  She has been in NSR here.  - Follow on telemetry, would consider event monitor when she goes home x 2 wks given some concern for syncope.  - Continue metoprolol and home flecainide.  If she is found at any point to have CAD, will need to stop flecainide.   Loralie Champagne 11/04/2018 2:08 PM

## 2018-11-04 NOTE — Anesthesia Procedure Notes (Signed)
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
## 2018-11-04 NOTE — Anesthesia Preprocedure Evaluation (Addendum)
Anesthesia Evaluation  Patient identified by MRN, date of birth, ID band Patient awake    Reviewed: Allergy & Precautions, NPO status , Patient's Chart, lab work & pertinent test results  History of Anesthesia Complications Negative for: history of anesthetic complications  Airway Mallampati: II  TM Distance: >3 FB Neck ROM: Full    Dental  (+) Edentulous Upper, Partial Lower, Dental Advisory Given   Pulmonary COPD, Current Smoker,    breath sounds clear to auscultation       Cardiovascular hypertension, Pt. on medications and Pt. 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)  + dysrhythmias Atrial Fibrillation  Rhythm:Irregular Rate:Normal  11/04/2018 ECHO: EF 60-65%, valves OK   Neuro/Psych Syncope: fell resulting in femur fracture CVA, No Residual Symptoms    GI/Hepatic   Endo/Other  diabetes, Insulin Dependent  Renal/GU      Musculoskeletal   Abdominal   Peds  Hematology   Anesthesia Other Findings   Reproductive/Obstetrics                            Anesthesia Physical Anesthesia Plan  ASA: III  Anesthesia Plan: General   Post-op Pain Management:    Induction: Intravenous  PONV Risk Score and Plan: 2 and Ondansetron and Dexamethasone  Airway Management Planned: Oral ETT  Additional Equipment:   Intra-op Plan:   Post-operative Plan: Extubation in OR  Informed Consent: I have reviewed the patients History and Physical, chart, labs and discussed the procedure including the risks, benefits and alternatives for the proposed anesthesia with the patient or authorized representative who has indicated his/her understanding and acceptance.     Dental advisory given  Plan Discussed with: CRNA and Surgeon  Anesthesia Plan Comments:         Anesthesia Quick Evaluation

## 2018-11-04 NOTE — Progress Notes (Addendum)
Hypoglycemic Event  CBG: 49  Treatment: Symptoms: none  Treatment: 50% Dextrose 25 cc IV   Follow-up CBG: Time: Patient taken to OR, Nurse given report to recheck    Kwasi Joung, Jolene Schimke
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
## 2018-11-04 NOTE — Transfer of Care (Signed)
Immediate Anesthesia Transfer of Care Note  Patient: Barbara [name]  Procedure(s) Performed: OPEN REDUCTION INTERNAL FIXATION (ORIF) DISTAL FEMUR FRACTURE (Left Leg Upper)  Patient Location: PACU  Anesthesia Type:General  Level of Consciousness: awake and alert   Airway & Oxygen Therapy: Patient connected to face mask oxygen  Post-op Assessment: Report given to RN and Post -op Vital signs reviewed and stable  Post vital signs: Reviewed and stable  Last Vitals:  Vitals Value Taken Time  BP 153/59 11/04/18 1908  Temp    Pulse 86 11/04/18 1909  Resp 18 11/04/18 1909  SpO2 100 % 11/04/18 1909  Vitals shown include unvalidated device data.  Last Pain:  Vitals:   11/04/18 1628  TempSrc:   PainSc: 10-Worst pain ever      Patients Stated Pain Goal: 1 (94/70/96 2836)  Complications: No apparent anesthesia complications

## 2018-11-04 NOTE — Progress Notes (Signed)
Patients daughter Manuela Schwartz called to get update about patient. Given update by RN.

## 2018-11-04 NOTE — Progress Notes (Signed)
Initial Nutrition Assessment  DOCUMENTATION CODES:   Not applicable  INTERVENTION:    Ensure Enlive po BID, each supplement provides 350 kcal and 20 grams of protein  MVI daily  NUTRITION DIAGNOSIS:   Increased nutrient needs related to hip fracture as evidenced by estimated needs.  GOAL:   Patient will meet greater than or equal to 90% of their needs  MONITOR:   PO intake, Supplement acceptance, Weight trends, Diet advancement, Skin, Labs, I & O's  REASON FOR ASSESSMENT:   Consult Hip fracture protocol  ASSESSMENT:   Patient with PMH significant for ASCVD, PAF, HTN, CVA, asthma, DM, GERD, HTN, and stroke (memory loss). Presents this admission after mechanical fall resulting in close left hip fracture.   RD working remotely.  Attempted to call pt, no answer. She is currently NPO for hip repair. RD to add supplementation to maximize kcal and protein this admission. Will need to obtain nutrition/weight history if possible.   Per chart records, pt's weight looks to fluctuate between 125-135 lb.   I/O: +430 since admit UOP: 200 ml x 24 hrs   Drips: NS @ 100 ml/hr  Medications: Vit D3, SS novolog, lantus, miralax, senokot Labs: CBG 30-99  Diet Order:   Diet Order            Diet NPO time specified Except for: Sips with Meds  Diet effective midnight              EDUCATION NEEDS:   Not appropriate for education at this time  Skin:  Skin Assessment: Reviewed RN Assessment  Last BM:  6/12  Height:   Ht Readings from Last 1 Encounters:  11/03/18 5\' 4"  (1.626 m)    Weight:   Wt Readings from Last 1 Encounters:  11/03/18 61 kg    Ideal Body Weight:  54.5 kg  BMI:  Body mass index is 23.08 kg/m.  Estimated Nutritional Needs:   Kcal:  1600-1800 kcal  Protein:  80-95 grams  Fluid:  >/= 1.6 L/day   Mariana Single RD, LDN Clinical Nutrition Pager # - [phone]

## 2018-11-04 NOTE — Op Note (Signed)
Orthopaedic Surgery Operative Note (CSN: [id number] ) Date of Surgery: 11/04/2018  Admit Date: 11/03/2018   Diagnoses: Pre-Op Diagnoses: Left intra-articular distal femur fracture   Post-Op Diagnosis: Same  Procedures: CPT 27513-Open reduction internal fixation of left distal femur fracture  Surgeons : Primary: Shona Needles, MD  Assistant: Patrecia Pace, PA-C  Location: OR 5   Anesthesia:General  Antibiotics: Ancef 2g preop   Tourniquet time:None  Estimated Blood QJJH:41 mL  Complications:None  Specimens:None  Implants: Implant Name Type Inv. Item Serial No. Manufacturer Lot No. LRB No. Used Action  PLATE DIST FEM 74Y - [id number] Plate PLATE DIST FEM 31S  [employer](ORTH,TRAU,BIO,SG)  Left 1 Implanted  SCREW LP 3.5X75MM - [id number] Screw SCREW LP 3.5X75MM  [employer](ORTH,TRAU,BIO,SG)  Left 1 Implanted  SCREW LP 3.5X60MM - [id number] Screw SCREW LP 3.5X60MM  [employer](ORTH,TRAU,BIO,SG)  Left 1 Implanted  SCREW 5.0 70MM - [id number] Screw SCREW 5.0 70MM  [employer](ORTH,TRAU,BIO,SG)  Left 3 Implanted  SCREW NCB 5.0X75MM - [id number] Screw SCREW NCB 5.0X75MM  [employer](ORTH,TRAU,BIO,SG)  Left 3 Implanted  SCREW NCB 5.0X36MM - [id number] Screw SCREW NCB 5.0X36MM  [employer](ORTH,TRAU,BIO,SG)  Left 1 Implanted  SCREW NCB 5.0X34MM - [id number] Screw SCREW NCB 5.0X34MM  [employer](ORTH,TRAU,BIO,SG)  Left 1 Implanted  CAP LOCK NCB - [id number] Cap CAP LOCK NCB  [employer](ORTH,TRAU,BIO,SG)  Left 8 Implanted  SCREW NCB 4.0 32MM - [id number] Screw SCREW NCB 4.0 32MM  [employer](ORTH,TRAU,BIO,SG)  Left 1 Implanted     Indications for Surgery: 78 year old female with a history of coronary vascular disease, diabetes type 2, atrial fibrillation, history of stroke, and hypertension with a left intra-articular distal femur fracture. I recommend proceeding with ORIF. I discussed risks and benefits with the patient as well as her daughter over the phone.  Risks include but  not limited to bleeding, infection, malunion, nonunion, hardware failure, posttraumatic arthritis, knee stiffness, DVT, heart attack and stroke. They agree with proceed with surgery. Consent was obtained.  Operative Findings: Supracondylar distal femur fracture with single intra-articular split treated with open reduction internal fixation using [employer].  3.5 millimeter screws from the proximal tibial ALPS set.  And plating with [employer] 12 hole NCB distal femoral locking plate  Procedure: The patient was identified in the preoperative holding area. Consent was confirmed with the patient and their family and all questions were answered. The operative extremity was marked after confirmation with the patient. she was then brought back to the operating room by our anesthesia colleagues.  She was placed under general anesthetic and carefully transferred over to a radiolucent flat top table.  A bump was placed under her operative hip. The operative extremity was then prepped and draped in usual sterile fashion. A preoperative timeout was performed to verify the patient, the procedure, and the extremity. Preoperative antibiotics were dosed.  Fluoroscopic imaging was used to show the unstable nature of the injury.  I then proceeded to make an anterior lateral parapatellar incision to approach the joint and distal femur.  I incised the skin and subcutaneous tissue down to the IT band and the capsule.  I incised along the lateral border of the patella extending it up and mobilized the vastus lateralis to go submuscular with the plate.  I was able to retract the patella out of the way to visualize the intra-articular split.  The hematoma was removed and I was able to manipulate the condyles and used a reduction tenaculum to compress the condyles together.  Fluoroscopic imaging  was used to confirm anatomic reduction.  I then used a 3.5 millimeter screws from the [employer] proximal tibial Alps set to  provide provisional fixation across the condyles and then removed the clamps and K wires that were holding it previously.  I then used a 12 hole [employer] NCB distal femoral locking plate.  I placed the jig and slid it submuscularly.  I placed a K wire distally to align it in the anterior posterior plane.  I made a percutaneous incision proximally and used a 3.3 mm drill bit to drill bicortically to hold the proximal portion of the plate appropriately.  A 3.3 mm drill bit was used to drill across the condyles distally.  2 screws were placed in obtained excellent fixation.  I then placed percutaneous 5.0 millimeter screws into the femoral shaft.  I removed the proximal 3.3 mm drill bit and exchanged to a 4.0 millimeter screw to try to prevent a stress riser.  Locking caps were placed on the distal shaft screws.  The jig was removed.  I proceeded to place 3 more distal screws.  Locking caps were placed on all of the distal screws.  Final fluoroscopic images were obtained.  A gram of vancomycin powder was placed in the incision after it was irrigated thoroughly.  The IT band and fascia was closed with 0 Vicryl.  Skin was closed with 2-0 Vicryl and 3-0 nylon.  A sterile dressing consisting of Mepilex with sterile cast padding and Ace wrap was placed.  The patient was awoken from anesthesia and taken the PACU in stable condition.  Post Op Plan/Instructions: Patient will be touchdown weightbearing to the left lower extremity.  She will have unrestricted range of motion.  She will be started on postoperative day 1 with Lovenox.  She will receive postoperative Ancef.  We will mobilize her with physical therapy.  I was present and performed the entire surgery.  Patrecia Pace, PA-C did assist me throughout the case. An assistant was necessary given the difficulty in approach, maintenance of reduction and ability to instrument the fracture.   Katha Hamming, MD [facility]

## 2018-11-04 NOTE — Progress Notes (Signed)
PROGRESS NOTE    Barbara [name]  ZOX:[id number] DOB: 22-Dec-1940 DOA: 11/03/2018 PCP: Asencion Noble, MD   Brief Narrative: Barbara [name] is a 78 y.o. female with medical history significant of ASCVD, PAF, hypertension, history of CVA, mild asthma, chronic back pain, type 2 diabetes, GERD, glaucoma, macular degeneration, hyperlipidemia, history of tobacco abuse.    Assessment & Plan:   Principal Problem:   Closed left femoral fracture (HCC) Active Problems:   Hypertension   Hyperlipidemia   Tobacco abuse   Asthma   PAF (paroxysmal atrial fibrillation) (HCC)   Diabetes mellitus type 2 in nonobese (HCC)   Elevated troponin   Closed left femoral fracture Secondary to fall. Question if this was syncopal mediated. -Orthopedic surgery recommendations -Cardiology recommendations for cardiac risk -Transthoracic Echocardiogram pending  Elevated troponin Likely secondary to demand ischemia.   Paroxysmal atrial fibrillation Not on anticoagulation. On metoprolol and Flecainide. Rate controlled -Continue Metoprolol and flecainide  Essential hypertension -Continue metoprolol  Diabetes mellitus, type 2 Hypoglycemia in setting of NPO -SSI -Hold Lantus   DVT prophylaxis: SCDs Code Status:   Code Status: Full Code Family Communication: None Disposition Plan: Discharge pending orthopedic maanagement   Consultants:   Orthopedic surgery  Cardiology  Procedures:   None  Antimicrobials:  None    Subjective: Significant leg pain. No other concerns.  Objective: Vitals:   11/04/18 0530 11/04/18 0640 11/04/18 0836 11/04/18 1114  BP:   (!) 137/48 (!) 114/52  Pulse: 70 69 70 65  Resp: (!) 23 12 13 16   Temp:   98.7 F (37.1 C) 98.5 F (36.9 C)  TempSrc:   Oral Oral  SpO2:    100%  Weight:      Height:        Intake/Output Summary (Last 24 hours) at 11/04/2018 1421 Last data filed at 11/04/2018 1100 Gross per 24 hour  Intake 629.5 ml  Output 200 ml  Net 429.5 ml    Filed Weights   11/03/18 1650 11/03/18 2300  Weight: 59 kg 61 kg    Examination:  General exam: Appears calm and comfortable Respiratory system: Clear to auscultation. Respiratory effort normal. Cardiovascular system: S1 & S2 heard, RRR. No murmurs, rubs, gallops or clicks. Gastrointestinal system: Abdomen is nondistended, soft and nontender. No organomegaly or masses felt. Normal bowel sounds heard. Central nervous system: Alert and oriented. No focal neurological deficits. Extremities: No edema. No calf tenderness Skin: No cyanosis. No rashes Psychiatry: Judgement and insight appear normal. Mood & affect appropriate.     Data Reviewed: I have personally reviewed following labs and imaging studies  CBC: Recent Labs  Lab 11/03/18 1723  WBC 13.8*  NEUTROABS 11.9*  HGB 12.7  HCT 40.0  MCV 93.5  PLT 811   Basic Metabolic Panel: Recent Labs  Lab 11/03/18 1723  NA 137  K 3.7  CL 101  CO2 23  GLUCOSE 83  BUN 16  CREATININE 0.94  CALCIUM 9.0   GFR: Estimated Creatinine Clearance: 44 mL/min (by C-G formula based on SCr of 0.94 mg/dL). Liver Function Tests: Recent Labs  Lab 11/03/18 1723  AST 28  ALT 17  ALKPHOS 61  BILITOT 0.5  PROT 6.9  ALBUMIN 4.0   No results for input(s): LIPASE, AMYLASE in the last 168 hours. No results for input(s): AMMONIA in the last 168 hours. Coagulation Profile: Recent Labs  Lab 11/03/18 1723  INR 1.0   Cardiac Enzymes: Recent Labs  Lab 11/03/18 1723 11/03/18 1832  CKTOTAL 140  --  TROPONINI 0.46* 0.45*   BNP (last 3 results) No results for input(s): PROBNP in the last 8760 hours. HbA1C: No results for input(s): HGBA1C in the last 72 hours. CBG: Recent Labs  Lab 11/04/18 0902 11/04/18 0904 11/04/18 0935 11/04/18 1133  GLUCAP 30* 44* 99 84   Lipid Profile: No results for input(s): CHOL, HDL, LDLCALC, TRIG, CHOLHDL, LDLDIRECT in the last 72 hours. Thyroid Function Tests: No results for input(s): TSH,  T4TOTAL, FREET4, T3FREE, THYROIDAB in the last 72 hours. Anemia Panel: No results for input(s): VITAMINB12, FOLATE, FERRITIN, TIBC, IRON, RETICCTPCT in the last 72 hours. Sepsis Labs: Recent Labs  Lab 11/03/18 1723 11/03/18 1832  LATICACIDVEN 2.0* 1.1    Recent Results (from the past 240 hour(s))  SARS Coronavirus 2 (CEPHEID - Performed in [place] hospital lab), Hosp Order     Status: None   Collection Time: 11/03/18  6:50 PM   Specimen: Nasopharyngeal Swab  Result Value Ref Range Status   SARS Coronavirus 2 NEGATIVE NEGATIVE Final    Comment: (NOTE) If result is NEGATIVE SARS-CoV-2 target nucleic acids are NOT DETECTED. The SARS-CoV-2 RNA is generally detectable in upper and lower  respiratory specimens during the acute phase of infection. The lowest  concentration of SARS-CoV-2 viral copies this assay can detect is 250  copies / mL. A negative result does not preclude SARS-CoV-2 infection  and should not be used as the sole basis for treatment or other  patient management decisions.  A negative result may occur with  improper specimen collection / handling, submission of specimen other  than nasopharyngeal swab, presence of viral mutation(s) within the  areas targeted by this assay, and inadequate number of viral copies  (<250 copies / mL). A negative result must be combined with clinical  observations, patient history, and epidemiological information. If result is POSITIVE SARS-CoV-2 target nucleic acids are DETECTED. The SARS-CoV-2 RNA is generally detectable in upper and lower  respiratory specimens dur ing the acute phase of infection.  Positive  results are indicative of active infection with SARS-CoV-2.  Clinical  correlation with patient history and other diagnostic information is  necessary to determine patient infection status.  Positive results do  not rule out bacterial infection or co-infection with other viruses. If result is PRESUMPTIVE POSTIVE SARS-CoV-2  nucleic acids MAY BE PRESENT.   A presumptive positive result was obtained on the submitted specimen  and confirmed on repeat testing.  While 2019 novel coronavirus  (SARS-CoV-2) nucleic acids may be present in the submitted sample  additional confirmatory testing may be necessary for epidemiological  and / or clinical management purposes  to differentiate between  SARS-CoV-2 and other Sarbecovirus currently known to infect humans.  If clinically indicated additional testing with an alternate test  methodology [phone]) is advised. The SARS-CoV-2 RNA is generally  detectable in upper and lower respiratory sp ecimens during the acute  phase of infection. The expected result is Negative. Fact Sheet for Patients:  StrictlyIdeas.no Fact Sheet for Healthcare Providers: BankingDealers.co.za This test is not yet approved or cleared by the [place] FDA and has been authorized for detection and/or diagnosis of SARS-CoV-2 by FDA under an Emergency Use Authorization (EUA).  This EUA will remain in effect (meaning this test can be used) for the duration of the COVID-19 declaration under Section 564(b)(1) of the Act, 21 U.S.C. section 360bbb-3(b)(1), unless the authorization is terminated or revoked sooner. Performed at [facility], [address], [place], [place] [address]   Surgical PCR  screen     Status: None   Collection Time: 11/04/18  7:04 AM   Specimen: Nasal Mucosa; Nasal Swab  Result Value Ref Range Status   MRSA, PCR NEGATIVE NEGATIVE Final   Staphylococcus aureus NEGATIVE NEGATIVE Final    Comment: (NOTE) The Xpert SA Assay (FDA approved for NASAL specimens in patients 68 years of age and older), is one component of a comprehensive surveillance program. It is not intended to diagnose infection nor to guide or monitor treatment. Performed at [facility], [place] [address] [place], [place] [address]          Radiology  Studies: Dg Chest 1 View  Result Date: 11/03/2018 CLINICAL DATA:  Fall with left hip pain radiating to the left knee. EXAM: CHEST  1 VIEW COMPARISON:  10/10/2017 FINDINGS: Cardiac silhouette is normal in size. No mediastinal or hilar masses. No evidence of adenopathy. Clear lungs.  No pleural effusion or pneumothorax. Skeletal structures are grossly intact. IMPRESSION: No active disease. Electronically Signed   By: Lajean Manes M.D.   On: 11/03/2018 18:38   Dg Knee 1-2 Views Left  Result Date: 11/03/2018 CLINICAL DATA:  Fall today.  Left knee pain. EXAM: LEFT KNEE - 1-2 VIEW COMPARISON:  None. FINDINGS: There is a fracture of the distal left femur. Fracture is comminuted. A fracture line extends from the lateral metadiaphysis extending inferiorly to the medial margin of the weight-bearing surface of the lateral femoral condyle. There is an oblique fracture line extends from the medial metaphysis towards the intercondylar notch. Fracture is mildly displaced, with the lateral fracture component displaced laterally by 1.1 cm in the medial component displaced medially by 7-8 mm. No other fractures. Knee joint is normally aligned. No joint effusion. IMPRESSION: Comminuted, mildly displaced fracture of the distal left femur as detailed. Knee joint normally aligned. Electronically Signed   By: Lajean Manes M.D.   On: 11/03/2018 18:41   Ct Head Wo Contrast  Result Date: 11/03/2018 CLINICAL DATA:  Fall.  Leg pain.  Hip pain. EXAM: CT HEAD WITHOUT CONTRAST CT CERVICAL SPINE WITHOUT CONTRAST TECHNIQUE: Multidetector CT imaging of the head and cervical spine was performed following the standard protocol without intravenous contrast. Multiplanar CT image reconstructions of the cervical spine were also generated. COMPARISON:  None. FINDINGS: CT HEAD FINDINGS Brain: No acute intracranial hemorrhage. No focal mass lesion. No CT evidence of acute infarction. No midline shift or mass effect. No hydrocephalus. Basilar  cisterns are patent. Vascular: No hyperdense vessel or unexpected calcification. Skull: Normal. Negative for fracture or focal lesion. Sinuses/Orbits: Paranasal sinuses and mastoid air cells are clear. Orbits are clear. Other: None. CT CERVICAL SPINE FINDINGS Alignment: Normal alignment of the cervical vertebral bodies. Skull base and vertebrae: Normal craniocervical junction. No loss of vertebral body height or disc height. Normal facet articulation. No evidence of fracture. Soft tissues and spinal canal: No prevertebral soft tissue swelling. No perispinal or epidural hematoma. Disc levels: Disc space narrowing at C5-C6 with endplate sclerosis. No subluxation osteophytosis this level. Upper chest: Clear Other: Lytic lesion in the C5 vertebral body consistent with benign hemangioma. IMPRESSION: 1. No intracranial trauma. 2. No acute intracranial findings. 3. No cervical spine fracture. 4. Mild disc osteophytic disease in the lower cervical spine. Electronically Signed   By: Suzy Bouchard M.D.   On: 11/03/2018 18:18   Ct Cervical Spine Wo Contrast  Result Date: 11/03/2018 CLINICAL DATA:  Fall.  Leg pain.  Hip pain. EXAM: CT HEAD WITHOUT CONTRAST CT  CERVICAL SPINE WITHOUT CONTRAST TECHNIQUE: Multidetector CT imaging of the head and cervical spine was performed following the standard protocol without intravenous contrast. Multiplanar CT image reconstructions of the cervical spine were also generated. COMPARISON:  None. FINDINGS: CT HEAD FINDINGS Brain: No acute intracranial hemorrhage. No focal mass lesion. No CT evidence of acute infarction. No midline shift or mass effect. No hydrocephalus. Basilar cisterns are patent. Vascular: No hyperdense vessel or unexpected calcification. Skull: Normal. Negative for fracture or focal lesion. Sinuses/Orbits: Paranasal sinuses and mastoid air cells are clear. Orbits are clear. Other: None. CT CERVICAL SPINE FINDINGS Alignment: Normal alignment of the cervical vertebral  bodies. Skull base and vertebrae: Normal craniocervical junction. No loss of vertebral body height or disc height. Normal facet articulation. No evidence of fracture. Soft tissues and spinal canal: No prevertebral soft tissue swelling. No perispinal or epidural hematoma. Disc levels: Disc space narrowing at C5-C6 with endplate sclerosis. No subluxation osteophytosis this level. Upper chest: Clear Other: Lytic lesion in the C5 vertebral body consistent with benign hemangioma. IMPRESSION: 1. No intracranial trauma. 2. No acute intracranial findings. 3. No cervical spine fracture. 4. Mild disc osteophytic disease in the lower cervical spine. Electronically Signed   By: Suzy Bouchard M.D.   On: 11/03/2018 18:18   Ct Knee Left Wo Contrast  Result Date: 11/03/2018 CLINICAL DATA:  Fracture. EXAM: CT OF THE left KNEE WITHOUT CONTRAST TECHNIQUE: Multidetector CT imaging of the left knee was performed according to the standard protocol. Multiplanar CT image reconstructions were also generated. COMPARISON:  Radiograph from same day. FINDINGS: Bones/Joint/Cartilage Again identified is an acute impacted fracture of the distal femur. There is a fracture plane that extends to the articular surface of the medial femoral condyle. There is osteopenia. There is some posterior angulation of the distal fracture fragment. There is mild to moderate multicompartmental joint space narrowing Ligaments Suboptimally assessed by CT. Muscles and Tendons The muscles and tendons are not well evaluated on this exam. There are some atherosclerotic changes of the popliteal artery and the below knee tibial vessels. Soft tissues There is a large joint effusion with evidence of lipohemarthrosis. IMPRESSION: 1. Acute impacted intra-articular fracture of the distal femur. 2. Large joint effusion with lipohemarthrosis. Electronically Signed   By: Constance Holster M.D.   On: 11/03/2018 21:44   Dg Hip Unilat With Pelvis 2-3 Views Left  Result  Date: 11/03/2018 CLINICAL DATA:  Fall today with left hip pain radiating to the left knee. EXAM: DG HIP (WITH OR WITHOUT PELVIS) 2-3V LEFT COMPARISON:  None. FINDINGS: No fracture.  No bone lesion. Hip joints, SI joints and symphysis pubis are normally aligned. Soft tissues are unremarkable. IMPRESSION: Negative. Electronically Signed   By: Lajean Manes M.D.   On: 11/03/2018 18:39        Scheduled Meds:  aspirin  162.5 mg Oral QHS   brimonidine  1 drop Both Eyes TID   cholecalciferol  1,000 Units Oral q morning - 10a   feeding supplement (ENSURE ENLIVE)  237 mL Oral BID BM   flecainide  100 mg Oral BID   insulin aspart  0-9 Units Subcutaneous Q4H   insulin glargine  9 Units Subcutaneous Daily   metoprolol tartrate  25 mg Oral BID   multivitamin with minerals  1 tablet Oral Daily   mupirocin ointment  1 application Nasal BID   polyethylene glycol  17 g Oral Daily   polyvinyl alcohol  1 drop Both Eyes BID   senna-docusate  1 tablet  Oral BID   Continuous Infusions:  sodium chloride Stopped (11/04/18 0246)   methocarbamol (ROBAXIN) IV       LOS: 1 day     Cordelia Poche, MD [facility] 11/04/2018, 2:21 PM  If 7PM-7AM, please contact night-coverage [email or web address]

## 2018-11-04 NOTE — Anesthesia Postprocedure Evaluation (Signed)
Anesthesia Post Note  Patient: Barbara [name]  Procedure(s) Performed: OPEN REDUCTION INTERNAL FIXATION (ORIF) DISTAL FEMUR FRACTURE (Left Leg Upper)     Patient location during evaluation: PACU Anesthesia Type: General Level of consciousness: awake and alert, patient cooperative and oriented Pain management: pain level controlled Vital Signs Assessment: post-procedure vital signs reviewed and stable Respiratory status: spontaneous breathing, nonlabored ventilation, respiratory function stable and patient connected to nasal cannula oxygen Cardiovascular status: blood pressure returned to baseline and stable Postop Assessment: no apparent nausea or vomiting Anesthetic complications: no    Last Vitals:  Vitals:   11/04/18 1953 11/04/18 2010  BP: 137/61 (!) 174/62  Pulse: 82 86  Resp: 15 15  Temp:  36.8 C  SpO2: 100% 100%    Last Pain:  Vitals:   11/04/18 2010  TempSrc: Oral  PainSc:                  [name],E. Braddock Servellon

## 2018-11-05 LAB — GLUCOSE, CAPILLARY
Glucose-Capillary: 199 mg/dL — ABNORMAL HIGH (ref 70–99)
Glucose-Capillary: 287 mg/dL — ABNORMAL HIGH (ref 70–99)
Glucose-Capillary: 292 mg/dL — ABNORMAL HIGH (ref 70–99)
Glucose-Capillary: 286 mg/dL — ABNORMAL HIGH (ref 70–99)
Glucose-Capillary: 100 mg/dL — ABNORMAL HIGH (ref 70–99)

## 2018-11-05 LAB — URINE CULTURE: Culture: <10000 — AB

## 2018-11-05 LAB — CBC
HCT: 33.5 % — ABNORMAL LOW (ref 36.0–46.0)
Hemoglobin: 10.8 g/dL — ABNORMAL LOW (ref 12.0–15.0)
MCH: 30 pg (ref 26.0–34.0)
MCHC: 32.2 g/dL (ref 30.0–36.0)
MCV: 93.1 fL (ref 80.0–100.0)
Platelets: 180 10*3/uL (ref 150–400)
RBC: 3.6 MIL/uL — ABNORMAL LOW (ref 3.87–5.11)
RDW: 12.9 % (ref 11.5–15.5)
WBC: 11.9 10*3/uL — ABNORMAL HIGH (ref 4.0–10.5)
nRBC: 0 % (ref 0.0–0.2)

## 2018-11-05 LAB — TROPONIN I: Troponin I: 0.68 ng/mL (ref <0.03)

## 2018-11-05 MED ORDER — VITAMIN D 25 MCG (1000 UNIT) PO TABS
2000.0000 [IU] | ORAL_TABLET | Freq: Every morning | ORAL | Status: DC
  Administered 2018-11-05 – 2018-11-08 (×4): 2000 [IU] via ORAL
  Filled 2018-11-05 (×4): qty 2

## 2018-11-05 MED ORDER — INSULIN ASPART 100 UNIT/ML ~~LOC~~ SOLN
0.0000 [IU] | Freq: Three times a day (TID) | SUBCUTANEOUS | Status: DC
  Administered 2018-11-05 – 2018-11-06 (×3): 5 [IU] via SUBCUTANEOUS
  Administered 2018-11-06 (×2): 2 [IU] via SUBCUTANEOUS

## 2018-11-05 NOTE — Progress Notes (Signed)
Daughter, Barbara [name] called this AM to check on her mother. RN updated daughter on patients status. Patient has been comfortable and resting this evening, patients pain has been controlled well with pain medication. Will continue to monitor patient.

## 2018-11-05 NOTE — Evaluation (Signed)
Physical Therapy Evaluation Patient Details Name: Barbara [name] MRN: [id number] DOB: 08/30/1940 Today's Date: 11/05/2018   History of Present Illness  78 yo admitted after fall with left femur fx s/p ORIF on 6/13. PMhx: PAF, hypertension, history of CVA, mild asthma, chronic back pain, type 2 diabetes, GERD, glaucoma, macular degeneration, hyperlipidemia  Clinical Impression  Pt in bed with Nasal cannula with O2 off with SpO2 98% on arrival and throughout session with HR 86-95 with activity. Pt premedicated and reported improved pain after mobility. Pt hesitant to move LLE due to fear of pain but able to transfer to chair and perform HEP. PT with decreased strength, transfers, mobility, function and independence who will benefit from acute therapy to maximize mobility, function and safety.   Pt reports she can stay with her daughter with 24hr assist but will need to be min assist or better with all mobility otherwise SNF recommended for D/C.  RN aware of sequence of transfer and need to monitor LLE to maintain TDWB.     Follow Up Recommendations Home health PT;Supervision/Assistance - 24 hour;SNF    Equipment Recommendations  Wheelchair (measurements PT)    Recommendations for Other Services OT consult     Precautions / Restrictions Precautions Precautions: Fall Restrictions Weight Bearing Restrictions: Yes LLE Weight Bearing: Touchdown weight bearing      Mobility  Bed Mobility Overal bed mobility: Needs Assistance Bed Mobility: Supine to Sit     Supine to sit: Mod assist;+2 for safety/equipment;HOB elevated     General bed mobility comments: HOB 30 degrees with cues for sequence, assist to move LLE and scoot fully to EOB as well as elevate trunk  Transfers Overall transfer level: Needs assistance   Transfers: Sit to/from Stand Sit to Stand: Min assist;+2 safety/equipment;From elevated surface         General transfer comment: min assist with right foot blocked to  maintain knee flexion, LLE on P.T. foot to monitor TDWB with use of RW, max cues with assist to rise and pivot to chair. pt able to scoot right foot but could not hop  Ambulation/Gait             General Gait Details: not yet able  Stairs            Wheelchair Mobility    Modified Rankin (Stroke Patients Only)       Balance Overall balance assessment: Needs assistance Sitting-balance support: Feet supported;Bilateral upper extremity supported Sitting balance-Leahy Scale: Fair     Standing balance support: Bilateral upper extremity supported Standing balance-Leahy Scale: Poor Standing balance comment: bil UE support with TDWB status                             Pertinent Vitals/Pain Pain Assessment: 0-10 Pain Score: 4  Pain Location: left leg Pain Descriptors / Indicators: Aching Pain Intervention(s): Limited activity within patient's tolerance;Repositioned;Monitored during session;Premedicated before session    Home Living Family/patient expects to be discharged to:: Private residence Living Arrangements: Spouse/significant other Available Help at Discharge: Family;Available 24 hours/day Type of Home: House Home Access: Stairs to enter Entrance Stairs-Rails: Left Entrance Stairs-Number of Steps: 3 Home Layout: One level Home Equipment: Grab bars - tub/shower;Walker - 2 wheels;Bedside commode      Prior Function Level of Independence: Independent               Hand Dominance        Extremity/Trunk Assessment  Upper Extremity Assessment Upper Extremity Assessment: Overall WFL for tasks assessed    Lower Extremity Assessment Lower Extremity Assessment: LLE deficits/detail LLE Deficits / Details: decreased strength and ROM related to post op pain LLE: Unable to fully assess due to pain    Cervical / Trunk Assessment Cervical / Trunk Assessment: Normal  Communication   Communication: No difficulties  Cognition  Arousal/Alertness: Awake/alert Behavior During Therapy: WFL for tasks assessed/performed Overall Cognitive Status: Impaired/Different from baseline Area of Impairment: Memory;Problem solving;Orientation;Following commands;Safety/judgement                 Orientation Level: Disoriented to;Time   Memory: Decreased short-term memory Following Commands: Follows one step commands with increased time Safety/Judgement: Decreased awareness of safety;Decreased awareness of deficits   Problem Solving: Slow processing;Decreased initiation;Requires verbal cues;Requires tactile cues General Comments: pt stating "wait a minute" throughout but with slow progression and cues, not oriented to day      General Comments      Exercises General Exercises - Lower Extremity Ankle Circles/Pumps: AAROM;Left;10 reps;Supine Long Arc Quad: AAROM;Left;Seated;10 reps Heel Slides: AAROM;AROM;Both;5 reps(AAROM on LLE)   Assessment/Plan    PT Assessment Patient needs continued PT services  PT Problem List Decreased strength;Decreased mobility;Decreased safety awareness;Decreased activity tolerance;Decreased balance;Decreased knowledge of use of DME;Decreased cognition;Pain       PT Treatment Interventions DME instruction;Therapeutic activities;Gait training;Therapeutic exercise;Patient/family education;Cognitive remediation;Stair training;Balance training;Functional mobility training    PT Goals (Current goals can be found in the Care Plan section)  Acute Rehab PT Goals Patient Stated Goal: return home PT Goal Formulation: With patient Time For Goal Achievement: 11/19/18 Potential to Achieve Goals: Fair    Frequency Min 4X/week   Barriers to discharge Decreased caregiver support pt reports she will stay with her daughter    Co-evaluation               AM-PAC PT "6 Clicks" Mobility  Outcome Measure Help needed turning from your back to your side while in a flat bed without using  bedrails?: A Little Help needed moving from lying on your back to sitting on the side of a flat bed without using bedrails?: A Lot Help needed moving to and from a bed to a chair (including a wheelchair)?: A Lot Help needed standing up from a chair using your arms (e.g., wheelchair or bedside chair)?: A Little Help needed to walk in hospital room?: A Lot Help needed climbing 3-5 steps with a railing? : Total 6 Click Score: 13    End of Session Equipment Utilized During Treatment: Gait belt Activity Tolerance: Patient tolerated treatment well Patient left: in chair;with call bell/phone within reach;with chair alarm set Nurse Communication: Mobility status;Weight bearing status PT Visit Diagnosis: Other abnormalities of gait and mobility (R26.89);Muscle weakness (generalized) (M62.81);Difficulty in walking, not elsewhere classified (R26.2);History of falling (Z91.81)    Time: 8366-2947 PT Time Calculation (min) (ACUTE ONLY): 25 min   Charges:   PT Evaluation $PT Eval Moderate Complexity: 1 Mod PT Treatments $Therapeutic Activity: 8-22 mins        Leelynd Maldonado Pam Drown, PT Acute Rehabilitation Services Pager: [phone] Office: [phone]   Nielle Duford B Trayonna Bachmeier 11/05/2018, 1:56 PM

## 2018-11-05 NOTE — Progress Notes (Signed)
Orthopaedic Trauma Progress Note  S: Patient doing well this morning, pain well controlled on current regimen. Worked well with PT this morning, was able to maintain TDWB status. Discussed procedure from yesterday, answered patient's questions.   Patient plans to go to her daughter's house at discharge. Does not feel the need to got to a rehab facility. States her daughter is an Therapist, sports and is currently working from home and is able to take care of her.   O:  Vitals:   11/05/18 0430 11/05/18 0725  BP:  (!) 121/45  Pulse: 68 69  Resp: (!) 9 15  Temp:  98 F (36.7 C)  SpO2:  100%    General - Sitting up in bedside chair. NAD. Alert and oriented. Pleasant and cooperative Cardiac - Heart RRR Respiratory - No increased work of breathing, CTA anterior lung fields bilaterally Left Lower Extremity - Dressing with minimal drainage, otherwise c/d/i. Some swelling of the knee noted. Moderately tender to palpation of knee and lateral thigh. Non-tender in left hip or in lower leg. Compartments soft and compressible. Extremity warm. Dorsiflexion/plnataflexion intact. Sensory and motor function intact distally. +DP pulse  Imaging: Stable post op imaging.   Labs:  Results for orders placed or performed during the hospital encounter of 11/03/18 (from the past 24 hour(s))  Glucose, capillary     Status: None   Collection Time: 11/04/18  9:35 AM  Result Value Ref Range   Glucose-Capillary 99 70 - 99 mg/dL   Comment 1 Notify RN    Comment 2 Document in Chart   Glucose, capillary     Status: None   Collection Time: 11/04/18 11:33 AM  Result Value Ref Range   Glucose-Capillary 84 70 - 99 mg/dL   Comment 1 Notify RN    Comment 2 Document in Chart   Troponin I - ONCE - STAT     Status: Abnormal   Collection Time: 11/04/18  2:33 PM  Result Value Ref Range   Troponin I 0.85 (HH) <0.03 ng/mL  Glucose, capillary     Status: Abnormal   Collection Time: 11/04/18  3:29 PM  Result Value Ref Range   Glucose-Capillary 49 (L) 70 - 99 mg/dL  Glucose, capillary     Status: Abnormal   Collection Time: 11/04/18  4:01 PM  Result Value Ref Range   Glucose-Capillary 165 (H) 70 - 99 mg/dL  Glucose, capillary     Status: Abnormal   Collection Time: 11/04/18  7:10 PM  Result Value Ref Range   Glucose-Capillary 106 (H) 70 - 99 mg/dL  Glucose, capillary     Status: Abnormal   Collection Time: 11/05/18  1:12 AM  Result Value Ref Range   Glucose-Capillary 199 (H) 70 - 99 mg/dL  CBC     Status: Abnormal   Collection Time: 11/05/18  3:18 AM  Result Value Ref Range   WBC 11.9 (H) 4.0 - 10.5 K/uL   RBC 3.60 (L) 3.87 - 5.11 MIL/uL   Hemoglobin 10.8 (L) 12.0 - 15.0 g/dL   HCT 33.5 (L) 36.0 - 46.0 %   MCV 93.1 80.0 - 100.0 fL   MCH 30.0 26.0 - 34.0 pg   MCHC 32.2 30.0 - 36.0 g/dL   RDW 12.9 11.5 - 15.5 %   Platelets 180 150 - 400 K/uL   nRBC 0.0 0.0 - 0.2 %  Glucose, capillary     Status: Abnormal   Collection Time: 11/05/18  5:49 AM  Result Value Ref Range  Glucose-Capillary 287 (H) 70 - 99 mg/dL    Assessment: 78 year old female s/p fall of unknown etiology  Injuries: Left distal femur fracture s/p ORIF  Weightbearing: TDWB LLE x 2 weeks  Insicional and dressing care: Dressing c/d/i. Will change tomorrow  Orthopedic device(s): none  CV/Blood loss: Acute blood loss anemia, Hgb 10.8 this AM. Hemodynamically stable  Pain management:  1. Tylenol 650 mg q 6 hours scheduled 2. Robaxin 500 mg q 6 hours PRN 3. Oxycodone 5-10 mg q 4 hours PRN 4. Neurontin 100 mg TID 5. Dilaudid 1 mg q 3 hours PRN  VTE prophylaxis: Lovenox  ID:  Ancef 2gm post op  Foley/Lines: No foley, KVO IVFs  Medical co-morbidities: history of coronary vascular disease, diabetes type 2, atrial fibrillation, history of stroke, and hypertension   Impediments to Fracture Healing: Vitamin D deficiency, increased D3 supplement to 2000 IU daily  Dispo: PT eval, dispo pending. Okay to discharge from ortho standpoint  once cleared by medicine team and therapy  Follow - up plan: 2 weeks   Corleen Otwell A. Carmie [facility] ?[phone]? (phone)
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
## 2018-11-05 NOTE — Progress Notes (Signed)
PROGRESS NOTE    Barbara [name]  ZHY:[id number] DOB: June 10, 1940 DOA: 11/03/2018 PCP: Asencion Noble, MD   Brief Narrative: Barbara [name] is a 78 y.o. female with medical history significant of ASCVD, PAF, hypertension, history of CVA, mild asthma, chronic back pain, type 2 diabetes, GERD, glaucoma, macular degeneration, hyperlipidemia, history of tobacco abuse.    Assessment & Plan:   Principal Problem:   Closed left femoral fracture (HCC) Active Problems:   Hypertension   Hyperlipidemia   Tobacco abuse   Asthma   PAF (paroxysmal atrial fibrillation) (HCC)   Diabetes mellitus type 2 in nonobese (HCC)   Elevated troponin   Closed left femoral fracture Secondary to fall. Question if this was syncopal mediated. -Orthopedic surgery recommendations -Cardiology recommendations for cardiac risk -Transthoracic Echocardiogram pending  Elevated troponin Likely secondary to demand ischemia. Cardiology consulted. Troponin still trending up yesterday but has reached peak. No chest pain -Cardiology recommendations  Acute respiratory failure with hypoxia Unknown etiology. -Wean to room air  Paroxysmal atrial fibrillation Not on anticoagulation. On metoprolol and Flecainide. Rate controlled -Continue Metoprolol and flecainide  Essential hypertension -Continue metoprolol  Diabetes mellitus, type 2 Hypoglycemia on 6/13 secondary to NPO status. Improved today -SSI with meals -Continue Lantus   DVT prophylaxis: SCDs Code Status:   Code Status: Full Code Family Communication: None Disposition Plan: Discharge pending orthopedic management/PT/cardiology recommendations   Consultants:   Orthopedic surgery  Cardiology  Procedures:   None  Antimicrobials:  None    Subjective: Leg pain is manageable. No other issues overnight. No chest pain.  Objective: Vitals:   11/05/18 0220 11/05/18 0330 11/05/18 0430 11/05/18 0725  BP: (!) 117/49   (!) 121/45  Pulse: 74 71 68 69   Resp: 18 11 (!) 9 15  Temp:    98 F (36.7 C)  TempSrc:    Oral  SpO2: 100%   100%  Weight:      Height:        Intake/Output Summary (Last 24 hours) at 11/05/2018 0910 Last data filed at 11/05/2018 0742 Gross per 24 hour  Intake 2415.67 ml  Output 1000 ml  Net 1415.67 ml   Filed Weights   11/03/18 1650 11/03/18 2300  Weight: 59 kg 61 kg    Examination:  General exam: Appears calm and comfortable Respiratory system: Clear to auscultation. Respiratory effort normal. Cardiovascular system: S1 & S2 heard, RRR. No murmurs, rubs, gallops or clicks. Gastrointestinal system: Abdomen is nondistended, soft and nontender. No organomegaly or masses felt. Normal bowel sounds heard. Central nervous system: Alert and oriented. No focal neurological deficits. Extremities: No edema. No calf tenderness Skin: No cyanosis. No rashes Psychiatry: Judgement and insight appear normal. Mood & affect appropriate.     Data Reviewed: I have personally reviewed following labs and imaging studies  CBC: Recent Labs  Lab 11/03/18 1723 11/05/18 0318  WBC 13.8* 11.9*  NEUTROABS 11.9*  --   HGB 12.7 10.8*  HCT 40.0 33.5*  MCV 93.5 93.1  PLT 254 295   Basic Metabolic Panel: Recent Labs  Lab 11/03/18 1723  NA 137  K 3.7  CL 101  CO2 23  GLUCOSE 83  BUN 16  CREATININE 0.94  CALCIUM 9.0   GFR: Estimated Creatinine Clearance: 44 mL/min (by C-G formula based on SCr of 0.94 mg/dL). Liver Function Tests: Recent Labs  Lab 11/03/18 1723  AST 28  ALT 17  ALKPHOS 61  BILITOT 0.5  PROT 6.9  ALBUMIN 4.0   No results  for input(s): LIPASE, AMYLASE in the last 168 hours. No results for input(s): AMMONIA in the last 168 hours. Coagulation Profile: Recent Labs  Lab 11/03/18 1723  INR 1.0   Cardiac Enzymes: Recent Labs  Lab 11/03/18 1723 11/03/18 1832 11/04/18 1433  CKTOTAL 140  --   --   TROPONINI 0.46* 0.45* 0.85*   BNP (last 3 results) No results for input(s): PROBNP in the  last 8760 hours. HbA1C: No results for input(s): HGBA1C in the last 72 hours. CBG: Recent Labs  Lab 11/04/18 1529 11/04/18 1601 11/04/18 1910 11/05/18 0112 11/05/18 0549  GLUCAP 49* 165* 106* 199* 287*   Lipid Profile: No results for input(s): CHOL, HDL, LDLCALC, TRIG, CHOLHDL, LDLDIRECT in the last 72 hours. Thyroid Function Tests: No results for input(s): TSH, T4TOTAL, FREET4, T3FREE, THYROIDAB in the last 72 hours. Anemia Panel: No results for input(s): VITAMINB12, FOLATE, FERRITIN, TIBC, IRON, RETICCTPCT in the last 72 hours. Sepsis Labs: Recent Labs  Lab 11/03/18 1723 11/03/18 1832  LATICACIDVEN 2.0* 1.1    Recent Results (from the past 240 hour(s))  SARS Coronavirus 2 (CEPHEID - Performed in [facility] hospital lab), Hosp Order     Status: None   Collection Time: 11/03/18  6:50 PM   Specimen: Nasopharyngeal Swab  Result Value Ref Range Status   SARS Coronavirus 2 NEGATIVE NEGATIVE Final    Comment: (NOTE) If result is NEGATIVE SARS-CoV-2 target nucleic acids are NOT DETECTED. The SARS-CoV-2 RNA is generally detectable in upper and lower  respiratory specimens during the acute phase of infection. The lowest  concentration of SARS-CoV-2 viral copies this assay can detect is 250  copies / mL. A negative result does not preclude SARS-CoV-2 infection  and should not be used as the sole basis for treatment or other  patient management decisions.  A negative result may occur with  improper specimen collection / handling, submission of specimen other  than nasopharyngeal swab, presence of viral mutation(s) within the  areas targeted by this assay, and inadequate number of viral copies  (<250 copies / mL). A negative result must be combined with clinical  observations, patient history, and epidemiological information. If result is POSITIVE SARS-CoV-2 target nucleic acids are DETECTED. The SARS-CoV-2 RNA is generally detectable in upper and lower  respiratory specimens  dur ing the acute phase of infection.  Positive  results are indicative of active infection with SARS-CoV-2.  Clinical  correlation with patient history and other diagnostic information is  necessary to determine patient infection status.  Positive results do  not rule out bacterial infection or co-infection with other viruses. If result is PRESUMPTIVE POSTIVE SARS-CoV-2 nucleic acids MAY BE PRESENT.   A presumptive positive result was obtained on the submitted specimen  and confirmed on repeat testing.  While 2019 novel coronavirus  (SARS-CoV-2) nucleic acids may be present in the submitted sample  additional confirmatory testing may be necessary for epidemiological  and / or clinical management purposes  to differentiate between  SARS-CoV-2 and other Sarbecovirus currently known to infect humans.  If clinically indicated additional testing with an alternate test  methodology [phone]) is advised. The SARS-CoV-2 RNA is generally  detectable in upper and lower respiratory sp ecimens during the acute  phase of infection. The expected result is Negative. Fact Sheet for Patients:  StrictlyIdeas.no Fact Sheet for Healthcare Providers: BankingDealers.co.za This test is not yet approved or cleared by the [place] FDA and has been authorized for detection and/or diagnosis of SARS-CoV-2 by FDA under  an Emergency Use Authorization (EUA).  This EUA will remain in effect (meaning this test can be used) for the duration of the COVID-19 declaration under Section 564(b)(1) of the Act, 21 U.S.C. section 360bbb-3(b)(1), unless the authorization is terminated or revoked sooner. Performed at [facility], [address], [place], [place] [address]   Surgical PCR screen     Status: None   Collection Time: 11/04/18  7:04 AM   Specimen: Nasal Mucosa; Nasal Swab  Result Value Ref Range Status   MRSA, PCR NEGATIVE NEGATIVE Final   Staphylococcus aureus  NEGATIVE NEGATIVE Final    Comment: (NOTE) The Xpert SA Assay (FDA approved for NASAL specimens in patients 69 years of age and older), is one component of a comprehensive surveillance program. It is not intended to diagnose infection nor to guide or monitor treatment. Performed at [facility], [place] [address] [place],  [address]          Radiology Studies: Dg Chest 1 View  Result Date: 11/03/2018 CLINICAL DATA:  Fall with left hip pain radiating to the left knee. EXAM: CHEST  1 VIEW COMPARISON:  10/10/2017 FINDINGS: Cardiac silhouette is normal in size. No mediastinal or hilar masses. No evidence of adenopathy. Clear lungs.  No pleural effusion or pneumothorax. Skeletal structures are grossly intact. IMPRESSION: No active disease. Electronically Signed   By: Lajean Manes M.D.   On: 11/03/2018 18:38   Dg Knee 1-2 Views Left  Result Date: 11/03/2018 CLINICAL DATA:  Fall today.  Left knee pain. EXAM: LEFT KNEE - 1-2 VIEW COMPARISON:  None. FINDINGS: There is a fracture of the distal left femur. Fracture is comminuted. A fracture line extends from the lateral metadiaphysis extending inferiorly to the medial margin of the weight-bearing surface of the lateral femoral condyle. There is an oblique fracture line extends from the medial metaphysis towards the intercondylar notch. Fracture is mildly displaced, with the lateral fracture component displaced laterally by 1.1 cm in the medial component displaced medially by 7-8 mm. No other fractures. Knee joint is normally aligned. No joint effusion. IMPRESSION: Comminuted, mildly displaced fracture of the distal left femur as detailed. Knee joint normally aligned. Electronically Signed   By: Lajean Manes M.D.   On: 11/03/2018 18:41   Ct Head Wo Contrast  Result Date: 11/03/2018 CLINICAL DATA:  Fall.  Leg pain.  Hip pain. EXAM: CT HEAD WITHOUT CONTRAST CT CERVICAL SPINE WITHOUT CONTRAST TECHNIQUE: Multidetector CT imaging of the head  and cervical spine was performed following the standard protocol without intravenous contrast. Multiplanar CT image reconstructions of the cervical spine were also generated. COMPARISON:  None. FINDINGS: CT HEAD FINDINGS Brain: No acute intracranial hemorrhage. No focal mass lesion. No CT evidence of acute infarction. No midline shift or mass effect. No hydrocephalus. Basilar cisterns are patent. Vascular: No hyperdense vessel or unexpected calcification. Skull: Normal. Negative for fracture or focal lesion. Sinuses/Orbits: Paranasal sinuses and mastoid air cells are clear. Orbits are clear. Other: None. CT CERVICAL SPINE FINDINGS Alignment: Normal alignment of the cervical vertebral bodies. Skull base and vertebrae: Normal craniocervical junction. No loss of vertebral body height or disc height. Normal facet articulation. No evidence of fracture. Soft tissues and spinal canal: No prevertebral soft tissue swelling. No perispinal or epidural hematoma. Disc levels: Disc space narrowing at C5-C6 with endplate sclerosis. No subluxation osteophytosis this level. Upper chest: Clear Other: Lytic lesion in the C5 vertebral body consistent with benign hemangioma. IMPRESSION: 1. No intracranial trauma. 2. No acute  intracranial findings. 3. No cervical spine fracture. 4. Mild disc osteophytic disease in the lower cervical spine. Electronically Signed   By: Suzy Bouchard M.D.   On: 11/03/2018 18:18   Ct Cervical Spine Wo Contrast  Result Date: 11/03/2018 CLINICAL DATA:  Fall.  Leg pain.  Hip pain. EXAM: CT HEAD WITHOUT CONTRAST CT CERVICAL SPINE WITHOUT CONTRAST TECHNIQUE: Multidetector CT imaging of the head and cervical spine was performed following the standard protocol without intravenous contrast. Multiplanar CT image reconstructions of the cervical spine were also generated. COMPARISON:  None. FINDINGS: CT HEAD FINDINGS Brain: No acute intracranial hemorrhage. No focal mass lesion. No CT evidence of acute  infarction. No midline shift or mass effect. No hydrocephalus. Basilar cisterns are patent. Vascular: No hyperdense vessel or unexpected calcification. Skull: Normal. Negative for fracture or focal lesion. Sinuses/Orbits: Paranasal sinuses and mastoid air cells are clear. Orbits are clear. Other: None. CT CERVICAL SPINE FINDINGS Alignment: Normal alignment of the cervical vertebral bodies. Skull base and vertebrae: Normal craniocervical junction. No loss of vertebral body height or disc height. Normal facet articulation. No evidence of fracture. Soft tissues and spinal canal: No prevertebral soft tissue swelling. No perispinal or epidural hematoma. Disc levels: Disc space narrowing at C5-C6 with endplate sclerosis. No subluxation osteophytosis this level. Upper chest: Clear Other: Lytic lesion in the C5 vertebral body consistent with benign hemangioma. IMPRESSION: 1. No intracranial trauma. 2. No acute intracranial findings. 3. No cervical spine fracture. 4. Mild disc osteophytic disease in the lower cervical spine. Electronically Signed   By: Suzy Bouchard M.D.   On: 11/03/2018 18:18   Ct Knee Left Wo Contrast  Result Date: 11/03/2018 CLINICAL DATA:  Fracture. EXAM: CT OF THE left KNEE WITHOUT CONTRAST TECHNIQUE: Multidetector CT imaging of the left knee was performed according to the standard protocol. Multiplanar CT image reconstructions were also generated. COMPARISON:  Radiograph from same day. FINDINGS: Bones/Joint/Cartilage Again identified is an acute impacted fracture of the distal femur. There is a fracture plane that extends to the articular surface of the medial femoral condyle. There is osteopenia. There is some posterior angulation of the distal fracture fragment. There is mild to moderate multicompartmental joint space narrowing Ligaments Suboptimally assessed by CT. Muscles and Tendons The muscles and tendons are not well evaluated on this exam. There are some atherosclerotic changes of the  popliteal artery and the below knee tibial vessels. Soft tissues There is a large joint effusion with evidence of lipohemarthrosis. IMPRESSION: 1. Acute impacted intra-articular fracture of the distal femur. 2. Large joint effusion with lipohemarthrosis. Electronically Signed   By: Constance Holster M.D.   On: 11/03/2018 21:44   Dg C-arm 1-60 Min  Result Date: 11/04/2018 CLINICAL DATA:  ORIF distal femur fracture EXAM: DG C-ARM 61-120 MIN; LEFT FEMUR 2 VIEWS COMPARISON:  11/03/2018 FINDINGS: Multiple intraoperative spot images demonstrate plate and screw fixation across the distal femoral fracture. Near anatomic alignment. No hardware complicating feature. IMPRESSION: Internal fixation across the distal left femoral fracture with near anatomic alignment. Electronically Signed   By: Rolm Baptise M.D.   On: 11/04/2018 19:10   Dg Hip Unilat With Pelvis 2-3 Views Left  Result Date: 11/03/2018 CLINICAL DATA:  Fall today with left hip pain radiating to the left knee. EXAM: DG HIP (WITH OR WITHOUT PELVIS) 2-3V LEFT COMPARISON:  None. FINDINGS: No fracture.  No bone lesion. Hip joints, SI joints and symphysis pubis are normally aligned. Soft tissues are unremarkable. IMPRESSION: Negative. Electronically Signed   By:  Lajean Manes M.D.   On: 11/03/2018 18:39   Dg Femur Min 2 Views Left  Result Date: 11/04/2018 CLINICAL DATA:  ORIF distal femur fracture EXAM: DG C-ARM 61-120 MIN; LEFT FEMUR 2 VIEWS COMPARISON:  11/03/2018 FINDINGS: Multiple intraoperative spot images demonstrate plate and screw fixation across the distal femoral fracture. Near anatomic alignment. No hardware complicating feature. IMPRESSION: Internal fixation across the distal left femoral fracture with near anatomic alignment. Electronically Signed   By: Rolm Baptise M.D.   On: 11/04/2018 19:10   Dg Femur Port Min 2 Views Left  Result Date: 11/04/2018 CLINICAL DATA:  Postop EXAM: LEFT FEMUR PORTABLE 2 VIEWS COMPARISON:  11/03/2018 FINDINGS:  Plate and screw fixation device noted across the distal femoral fracture. No hardware complicating feature. Near anatomic alignment. IMPRESSION: Internal fixation of the distal femoral fracture with near anatomic alignment. Electronically Signed   By: Rolm Baptise M.D.   On: 11/04/2018 20:40        Scheduled Meds:  acetaminophen  650 mg Oral Q6H   brimonidine  1 drop Both Eyes TID   cholecalciferol  2,000 Units Oral q morning - 10a   enoxaparin (LOVENOX) injection  40 mg Subcutaneous Q24H   feeding supplement (ENSURE ENLIVE)  237 mL Oral BID BM   flecainide  100 mg Oral BID   gabapentin  100 mg Oral TID   insulin aspart  0-9 Units Subcutaneous Q4H   insulin glargine  9 Units Subcutaneous Daily   metoprolol tartrate  25 mg Oral BID   multivitamin with minerals  1 tablet Oral Daily   mupirocin ointment  1 application Nasal BID   polyethylene glycol  17 g Oral Daily   polyvinyl alcohol  1 drop Both Eyes BID   senna-docusate  1 tablet Oral BID   Continuous Infusions:  sodium chloride Stopped (11/04/18 0246)    ceFAZolin (ANCEF) IV 2 g (11/05/18 0539)   lactated ringers 10 mL/hr at 11/04/18 1603   methocarbamol (ROBAXIN) IV 500 mg (11/04/18 1623)     LOS: 2 days     Cordelia Poche, MD [facility] 11/05/2018, 9:10 AM  If 7PM-7AM, please contact night-coverage [email or web address]

## 2018-11-06 LAB — CBC
HCT: 30.5 % — ABNORMAL LOW (ref 36.0–46.0)
Hemoglobin: 9.8 g/dL — ABNORMAL LOW (ref 12.0–15.0)
MCH: 30.1 pg (ref 26.0–34.0)
MCHC: 32.1 g/dL (ref 30.0–36.0)
MCV: 93.6 fL (ref 80.0–100.0)
Platelets: 168 10*3/uL (ref 150–400)
RBC: 3.26 MIL/uL — ABNORMAL LOW (ref 3.87–5.11)
RDW: 12.6 % (ref 11.5–15.5)
WBC: 11.1 10*3/uL — ABNORMAL HIGH (ref 4.0–10.5)
nRBC: 0 % (ref 0.0–0.2)

## 2018-11-06 LAB — GLUCOSE, CAPILLARY
Glucose-Capillary: 30 mg/dL — CL (ref 70–99)
Glucose-Capillary: 278 mg/dL — ABNORMAL HIGH (ref 70–99)
Glucose-Capillary: 188 mg/dL — ABNORMAL HIGH (ref 70–99)
Glucose-Capillary: 161 mg/dL — ABNORMAL HIGH (ref 70–99)
Glucose-Capillary: 312 mg/dL — ABNORMAL HIGH (ref 70–99)

## 2018-11-06 MED ORDER — INSULIN GLARGINE 100 UNIT/ML ~~LOC~~ SOLN
14.0000 [IU] | Freq: Every day | SUBCUTANEOUS | Status: DC
  Administered 2018-11-07: 14 [IU] via SUBCUTANEOUS
  Filled 2018-11-06 (×2): qty 0.14

## 2018-11-06 MED ORDER — KETOROLAC TROMETHAMINE 15 MG/ML IJ SOLN
15.0000 mg | Freq: Four times a day (QID) | INTRAMUSCULAR | Status: AC
  Administered 2018-11-06 – 2018-11-07 (×4): 15 mg via INTRAVENOUS
  Filled 2018-11-06 (×4): qty 1

## 2018-11-06 MED ORDER — NICOTINE 7 MG/24HR TD PT24
7.0000 mg | MEDICATED_PATCH | Freq: Every day | TRANSDERMAL | Status: DC
  Administered 2018-11-06 – 2018-11-08 (×3): 7 mg via TRANSDERMAL
  Filled 2018-11-06 (×3): qty 1

## 2018-11-06 NOTE — Progress Notes (Signed)
Physical Therapy Treatment Patient Details Name: Barbara [name] MRN: [id number] DOB: 02-05-1941 Today's Date: 11/06/2018    History of Present Illness 78 yo admitted after fall with left femur fx s/p ORIF on 6/13. PMhx: PAF, hypertension, history of CVA, mild asthma, chronic back pain, type 2 diabetes, GERD, glaucoma, macular degeneration, hyperlipidemia    PT Comments    Patient seen for mobility progression. Patient requires Min A for bed level mobility with assist needed to guide L LE to EOB as well as for initial trunk control to come into sitting. Patient Mod A +2 for OOB transfers to recliner - able to maintain L LE TDWB with transfer, however patient with reduced balance and UE use at RW needed for safe and efficient transfer. With current functional status will recommend SNF at discharge. PT to continue to follow acutely.     Follow Up Recommendations  SNF;Supervision/Assistance - 24 hour     Equipment Recommendations  Wheelchair (measurements PT)    Recommendations for Other Services OT consult     Precautions / Restrictions Precautions Precautions: Fall Restrictions Weight Bearing Restrictions: Yes LLE Weight Bearing: Touchdown weight bearing    Mobility  Bed Mobility Overal bed mobility: Needs Assistance Bed Mobility: Supine to Sit     Supine to sit: HOB elevated;Mod assist;+2 for physical assistance     General bed mobility comments: requires assist to guide L LE towards EOB; use of bed rails and bed pad to turn hips at EOB  Transfers Overall transfer level: Needs assistance Equipment used: Rolling walker (2 wheeled) Transfers: Sit to/from [facility] Sit to Stand: Min assist;+2 physical assistance Stand pivot transfers: Mod assist;+2 physical assistance       General transfer comment: Min A to stand at bedside with PT foot under patient L LE to ensure she maintains WB status; cueing for hand placement and sequencing; most difficulty with  pivoting to chair with patient unable to hop - requires Mod A +2 to complete safely and manage RW  Ambulation/Gait             General Gait Details: not yet able   Stairs             Wheelchair Mobility    Modified Rankin (Stroke Patients Only)       Balance Overall balance assessment: Needs assistance Sitting-balance support: Bilateral upper extremity supported;Feet supported Sitting balance-Leahy Scale: Fair     Standing balance support: Bilateral upper extremity supported;During functional activity Standing balance-Leahy Scale: Poor Standing balance comment: bil UE support with TDWB status                            Cognition Arousal/Alertness: Awake/alert Behavior During Therapy: WFL for tasks assessed/performed Overall Cognitive Status: Impaired/Different from baseline Area of Impairment: Following commands;Safety/judgement;Problem solving                       Following Commands: Follows one step commands with increased time Safety/Judgement: Decreased awareness of safety;Decreased awareness of deficits   Problem Solving: Slow processing;Decreased initiation;Requires verbal cues;Requires tactile cues General Comments: when asking patient questions regarding mobility and home safety patient only states "I will figure it out" - unrealistic regarding current functional level      Exercises General Exercises - Lower Extremity Quad Sets: AROM;Both;10 reps    General Comments        Pertinent Vitals/Pain Pain Assessment: 0-10 Pain Score: 7  Pain  Location: left leg Pain Descriptors / Indicators: Aching;Discomfort;Operative site guarding Pain Intervention(s): Limited activity within patient's tolerance;Monitored during session;Repositioned;Ice applied    Home Living Family/patient expects to be discharged to:: Private residence Living Arrangements: Spouse/significant other Available Help at Discharge: Family;Available 24  hours/day Type of Home: House Home Access: Stairs to enter Entrance Stairs-Rails: Left Home Layout: One level Home Equipment: Grab bars - tub/shower;Walker - 2 wheels;Bedside commode Additional Comments: pt reports plan to dc to her daughter home     Prior Function Level of Independence: Independent      Comments: independent, driving   PT Goals (current goals can now be found in the care plan section) Acute Rehab PT Goals Patient Stated Goal: return home PT Goal Formulation: With patient Time For Goal Achievement: 11/19/18 Potential to Achieve Goals: Fair Progress towards PT goals: Progressing toward goals    Frequency    Min 4X/week      PT Plan Current plan remains appropriate    Co-evaluation PT/OT/SLP Co-Evaluation/Treatment: Yes Reason for Co-Treatment: For patient/therapist safety;To address functional/ADL transfers PT goals addressed during session: Mobility/safety with mobility;Proper use of DME;Strengthening/ROM        AM-PAC PT "6 Clicks" Mobility   Outcome Measure  Help needed turning from your back to your side while in a flat bed without using bedrails?: A Little Help needed moving from lying on your back to sitting on the side of a flat bed without using bedrails?: A Lot Help needed moving to and from a bed to a chair (including a wheelchair)?: A Lot Help needed standing up from a chair using your arms (e.g., wheelchair or bedside chair)?: A Little Help needed to walk in hospital room?: Total Help needed climbing 3-5 steps with a railing? : Total 6 Click Score: 12    End of Session Equipment Utilized During Treatment: Gait belt Activity Tolerance: Patient tolerated treatment well Patient left: in chair;with call bell/phone within reach;with chair alarm set Nurse Communication: Mobility status;Weight bearing status PT Visit Diagnosis: Other abnormalities of gait and mobility (R26.89);Muscle weakness (generalized) (M62.81);Difficulty in walking, not  elsewhere classified (R26.2);History of falling (Z91.81)     Time: 5830-9407 PT Time Calculation (min) (ACUTE ONLY): 28 min  Charges:  $Therapeutic Activity: 8-22 mins                      Lanney Gins, PT, DPT Supplemental Physical Therapist 11/06/18 9:29 AM Pager: [phone] Office: [phone]

## 2018-11-06 NOTE — NC FL2 (Addendum)
[place] LEVEL OF CARE SCREENING TOOL     IDENTIFICATION  Patient Name: Barbara [name] Birthdate: 18-Mar-1941 Sex: female Admission Date (Current Location): 11/03/2018  [facility] and [place] Number:  Herbalist and Address:  [facility] [place] [place] [address] [place], [place] [address]      Provider Number: [id number]  Attending Physician Name and Address:  Mariel Aloe, MD  Relative Name and Phone Number:  Gwenlyn Fudge, Daughter, [phone]    Current Level of Care: Hospital Recommended Level of Care: [place] Prior Approval Number:    Date Approved/Denied:   PASRR Number: [id number]  Discharge Plan: SNF    Current Diagnoses: Patient Active Problem List   Diagnosis Date Noted  . Closed left femoral fracture ([place]) 11/03/2018  . Elevated troponin 11/03/2018  . 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 Debility 10/10/2017  . Postoperative pain   . PAF (paroxysmal atrial fibrillation) ([place])   . Diabetes mellitus type 2 in nonobese (HCC)   . History of GI bleed   . Diabetic acidosis without coma ([place])   . Wide Complex Tachycardia/H/o SVT and H/o Afib 09/29/2017  . Sepsis ([place]) 09/29/2017  . Colonic diverticular abscess 09/27/2017  . Diverticulitis of both large and small intestine with perforation and abscess without bleeding 09/24/2017  . Unspecified atrial fibrillation ([place]) 09/23/2017  . Dizziness 07/28/2015  . Orthostatic hypotension 07/28/2015  . Rectal bleeding 12/05/2013  . Melena 12/05/2013  . Personal history of colonic polyps 12/05/2013  . Rapid atrial fibrillation ([place]) 08/15/2013  . Atrial fibrillation with RVR ([place]) 08/15/2013  . Right upper quadrant pain 11/16/2011  . Abdominal pain, acute, left lower quadrant 09/01/2011  . Cerebrovascular disease 10/09/2010  . Hypertension   . Hyperlipidemia   . Diabetes mellitus ([place])   . Arteriosclerotic cardiovascular disease (ASCVD)   . Macular  degeneration   . Tobacco abuse   . Asthma   . Palpitations   . Chest pain     Orientation RESPIRATION BLADDER Height & Weight     Self, Time, Situation, Place  Normal Continent, External catheter Weight: 134 lb 7.7 oz (61 kg) Height:  5\' 4"  (162.6 cm)  BEHAVIORAL SYMPTOMS/MOOD NEUROLOGICAL BOWEL NUTRITION STATUS      Continent Diet(Carb mod diet, thin liquids)  AMBULATORY STATUS COMMUNICATION OF NEEDS Skin   Limited Assist   Surgical wounds, Skin abrasions(surgical incision on left leg (adhesive bandage), rash on buttocks, abrasion on right leg)                       Personal Care Assistance Level of Assistance  Bathing, Dressing, Feeding, Total care Bathing Assistance: Limited assistance Feeding assistance: Independent Dressing Assistance: Limited assistance Total Care Assistance: Limited assistance   Functional Limitations Info  Sight, Speech, Hearing Sight Info: Adequate Hearing Info: Adequate Speech Info: Adequate    SPECIAL CARE FACTORS FREQUENCY  PT (By licensed PT), OT (By licensed OT)     PT Frequency: 5x/wk OT Frequency: 5x/wk            Contractures Contractures Info: Not present    Additional Factors Info  Code Status, Allergies Code Status Info: Full Code Allergies Info: Bee Venom, Neomycin, Latex, Penicillins, Sulfamethoxazole, Sulfonamide Derivatives           Current Medications (11/06/2018):  This is the current hospital active medication list Current Facility-Administered Medications  Medication Dose Route Frequency Provider Last Rate Last Dose  . 0.9 %  sodium chloride infusion   Intravenous Continuous Delray Alt, PA-C   Stopped at 11/04/18 0246  . acetaminophen (TYLENOL) tablet 650 mg  650 mg Oral Q6H Patrecia Pace A, PA-C   650 mg at 11/06/18 1707  . bisacodyl (DULCOLAX) suppository 10 mg  10 mg Rectal Daily PRN Patrecia Pace A, PA-C      . brimonidine (ALPHAGAN) 0.2 % ophthalmic solution 1 drop  1 drop Both Eyes TID Patrecia Pace A, PA-C   1 drop at 11/06/18 1544  . cholecalciferol (VITAMIN D3) tablet 2,000 Units  2,000 Units Oral q morning - 10a Delray Alt, PA-C   2,000 Units at 11/06/18 1013  . enoxaparin (LOVENOX) injection 40 mg  40 mg Subcutaneous Q24H Patrecia Pace A, PA-C   40 mg at 11/06/18 1012  . feeding supplement (ENSURE ENLIVE) (ENSURE ENLIVE) liquid 237 mL  237 mL Oral BID BM Patrecia Pace A, PA-C   237 mL at 11/06/18 1409  . flecainide (TAMBOCOR) tablet 100 mg  100 mg Oral BID Patrecia Pace A, PA-C   100 mg at 11/06/18 1015  . gabapentin (NEURONTIN) capsule 100 mg  100 mg Oral TID Patrecia Pace A, PA-C   100 mg at 11/06/18 1542  . HYDROmorphone (DILAUDID) injection 1 mg  1 mg Intravenous Q3H PRN Delray Alt, PA-C   1 mg at 11/06/18 7858  . insulin aspart (novoLOG) injection 0-9 Units  0-9 Units Subcutaneous TID WC Mariel Aloe, MD   2 Units at 11/06/18 1708  . [START ON 11/07/2018] insulin glargine (LANTUS) injection 14 Units  14 Units Subcutaneous QHS Mariel Aloe, MD      . ketorolac (TORADOL) 15 MG/ML injection 15 mg  15 mg Intravenous Q6H Patrecia Pace A, PA-C   15 mg at 11/06/18 1707  . lactated ringers infusion   Intravenous Continuous Delray Alt, PA-C 10 mL/hr at 11/04/18 1603    . magnesium citrate solution 1 Bottle  1 Bottle Oral Once PRN Delray Alt, PA-C      . methocarbamol (ROBAXIN) tablet 500 mg  500 mg Oral Q6H PRN Patrecia Pace A, PA-C   500 mg at 11/06/18 1409   Or  . methocarbamol (ROBAXIN) 500 mg in dextrose 5 % 50 mL IVPB  500 mg Intravenous Q6H PRN Patrecia Pace A, PA-C 100 mL/hr at 11/04/18 1623 500 mg at 11/04/18 1623  . metoprolol tartrate (LOPRESSOR) tablet 25 mg  25 mg Oral BID Patrecia Pace A, PA-C   25 mg at 11/06/18 1014  . multivitamin with minerals tablet 1 tablet  1 tablet Oral Daily Delray Alt, PA-C   1 tablet at 11/06/18 1013  . mupirocin ointment (BACTROBAN) 2 % 1 application  1 application Nasal BID Delray Alt, PA-C   1 application at  85/02/77 1018  . nicotine (NICODERM CQ - dosed in mg/24 hr) patch 7 mg  7 mg Transdermal Daily Mariel Aloe, MD   7 mg at 11/06/18 1017  . nitroGLYCERIN (NITROSTAT) SL tablet 0.4 mg  0.4 mg Sublingual Q5 Min x 3 PRN Patrecia Pace A, PA-C      . oxyCODONE (Oxy IR/ROXICODONE) immediate release tablet 5-10 mg  5-10 mg Oral Q4H PRN Patrecia Pace A, PA-C   10 mg at 11/06/18 1542  . polyethylene glycol (MIRALAX / GLYCOLAX) packet 17 g  17 g Oral Daily Patrecia Pace A, PA-C   17 g at 11/06/18 1015  . polyvinyl alcohol (LIQUIFILM TEARS) 1.4 %  ophthalmic solution 1 drop  1 drop Both Eyes BID Patrecia Pace A, PA-C   1 drop at 11/06/18 1019  . prochlorperazine (COMPAZINE) injection 5 mg  5 mg Intravenous Q4H PRN Delray Alt, PA-C      . senna-docusate (Senokot-S) tablet 1 tablet  1 tablet Oral BID Delray Alt, PA-C   1 tablet at 11/06/18 1015     Discharge Medications: Please see discharge summary for a list of discharge medications.  Relevant Imaging Results:  Relevant Lab Results:   Additional Information SSN: [id number]   Insulin aspart novolog 0-9 units 3x daily w/meals  Insulin glargine (lantus) 14 units daily at bedtime  Sheriff Rodenberg B Bluma Buresh, LCSWA

## 2018-11-06 NOTE — Progress Notes (Signed)
Orthopaedic Trauma Progress Note  S: Patient doing okay today, having a lot of pain around her knee. Will add Toradol to help with pain control. Was able to transfer from bed to chair today with PT. No other questions or concerns   O:  Vitals:   11/06/18 0330 11/06/18 0757  BP:  (!) 104/51  Pulse: 70 78  Resp:    Temp:  98.5 F (36.9 C)  SpO2:  100%    General - Sitting up in bedside chair. NAD. Alert and oriented. Pleasant and cooperative Cardiac - Heart RRR Respiratory - No increased work of breathing, CTA anterior lung fields bilaterally Left Lower Extremity - Dressing removed, incisions c/d/i. Some swelling of the knee noted. Holding her foot in a bit of plantarflexion. Moderately tender to palpation of knee and lateral thigh. Non-tender in left hip or in lower leg. Compartments soft and compressible. Extremity warm. Dorsiflexion/plantarflexion intact. Sensory and motor function intact distally. +DP pulse  Imaging: Stable post op imaging.   Labs:  Results for orders placed or performed during the hospital encounter of 11/03/18 (from the past 24 hour(s))  Glucose, capillary     Status: Abnormal   Collection Time: 11/05/18  4:13 PM  Result Value Ref Range   Glucose-Capillary 286 (H) 70 - 99 mg/dL   Comment 1 Notify RN    Comment 2 Document in Chart   Glucose, capillary     Status: Abnormal   Collection Time: 11/05/18  9:20 PM  Result Value Ref Range   Glucose-Capillary 100 (H) 70 - 99 mg/dL  CBC     Status: Abnormal   Collection Time: 11/06/18  2:15 AM  Result Value Ref Range   WBC 11.1 (H) 4.0 - 10.5 K/uL   RBC 3.26 (L) 3.87 - 5.11 MIL/uL   Hemoglobin 9.8 (L) 12.0 - 15.0 g/dL   HCT 30.5 (L) 36.0 - 46.0 %   MCV 93.6 80.0 - 100.0 fL   MCH 30.1 26.0 - 34.0 pg   MCHC 32.1 30.0 - 36.0 g/dL   RDW 12.6 11.5 - 15.5 %   Platelets 168 150 - 400 K/uL   nRBC 0.0 0.0 - 0.2 %  Glucose, capillary     Status: Abnormal   Collection Time: 11/06/18  6:26 AM  Result Value Ref Range    Glucose-Capillary 278 (H) 70 - 99 mg/dL  Glucose, capillary     Status: Abnormal   Collection Time: 11/06/18 11:36 AM  Result Value Ref Range   Glucose-Capillary 188 (H) 70 - 99 mg/dL   Comment 1 Notify RN    Comment 2 Document in Chart     Assessment: 78 year old female s/p fall of unknown etiology  Injuries: Left distal femur fracture s/p ORIF  Weightbearing: TDWB LLE x 2 weeks  Insicional and dressing care: Dressing c/d/i. Will change tomorrow  Orthopedic device(s): none  CV/Blood loss: Acute blood loss anemia, Hgb 10.8 this AM. Hemodynamically stable  Pain management:  1. Tylenol 650 mg q 6 hours scheduled 2. Robaxin 500 mg q 6 hours PRN 3. Oxycodone 5-10 mg q 4 hours PRN 4. Neurontin 100 mg TID 5. Dilaudid 1 mg q 3 hours PRN 6. Toradol 15 mg q 6 hours x 5 doses  VTE prophylaxis: Lovenox  ID:  Ancef 2gm post op completed  Foley/Lines: No foley, KVO IVFs  Medical co-morbidities: history of coronary vascular disease, diabetes type 2, atrial fibrillation, history of stroke, and hypertension   Impediments to Fracture Healing: Vitamin  D deficiency, increased D3 supplement to 2000 IU daily  Dispo: PT eval, dispo pending. Okay to discharge from ortho standpoint once cleared by medicine team and therapy  Follow - up plan: 2 weeks for suture removal and repeat x-rays   Ivianna Notch A. Carmie [facility] ?[phone]? (phone)
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
## 2018-11-06 NOTE — Care Management (Signed)
CM acknowledges consult for assistance with medicaid application - pt informed that she will need to contact DSS directly

## 2018-11-06 NOTE — Plan of Care (Signed)
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
## 2018-11-06 NOTE — Progress Notes (Signed)
OT evaluation:  PT admitted after fall with ORIF to L femur.  She is limited by pain, impaired balance, decreased activity tolerance, decreased safety awareness/problem solving.  Educated on precautions, safety and ADls. She requires setup for UB ADLs seated, max-total assist for LB ADLs and +2 min-mod assist for transfers using RW.  Based on performance today, she will need continued OT services while admitted and SNF rehab at dc in order to improved mobility, self care and balance prior to dc home.    11/06/18 0800  OT Visit Information  Last OT Received On 11/06/18  Assistance Needed +2  History of Present Illness 78 yo admitted after fall with left femur fx s/p ORIF on 6/13. PMhx: PAF, hypertension, history of CVA, mild asthma, chronic back pain, type 2 diabetes, GERD, glaucoma, macular degeneration, hyperlipidemia  Precautions  Precautions Fall  Restrictions  Weight Bearing Restrictions Yes  LLE Weight Bearing TWB  Home Living  Family/patient expects to be discharged to: Private residence  Living Arrangements Spouse/significant other  Available Help at Discharge Family;Available 24 hours/day  Type of Home House  Home Access Stairs to enter  Entrance Stairs-Number of Steps 3  Entrance Stairs-Rails Left  Home Layout One level  Bathroom Therapist, music Grab bars - tub/shower;Walker - 2 wheels;BSC  Additional Comments pt reports plan to dc to her daughter home   Prior Function  Level of Independence Independent  Comments independent, driving  Communication  Communication No difficulties  Pain Assessment  Pain Assessment 0-10  Pain Score 7  Pain Location left leg  Pain Descriptors / Indicators Aching;Discomfort;Operative site guarding  Pain Intervention(s) Monitored during session;Repositioned;Limited activity within patient's tolerance  Cognition  Arousal/Alertness Awake/alert  Behavior During Therapy WFL for tasks  assessed/performed  Overall Cognitive Status Impaired/Different from baseline  Area of Impairment Following commands;Safety/judgement;Awareness;Problem solving  Following Commands Follows one step commands with increased time  Safety/Judgement Decreased awareness of safety;Decreased awareness of deficits  Awareness Emergent  Problem Solving Slow processing;Decreased initiation;Requires verbal cues;Requires tactile cues  General Comments pt with decreased awareness of deficits and need for assistance  Upper Extremity Assessment  Upper Extremity Assessment Overall WFL for tasks assessed  Lower Extremity Assessment  Lower Extremity Assessment Defer to PT evaluation  Cervical / Trunk Assessment  Cervical / Trunk Assessment Normal  ADL  Overall ADL's  Needs assistance/impaired  Grooming Set up;Sitting  Upper Body Bathing Set up;Sitting  Lower Body Bathing Moderate assistance;+2 for physical assistance;Sit to/from stand  Lower Body Bathing Details (indicate cue type and reason) +2 sit <>stand, relaint on B UE support and unable to reach L foot  Upper Body Dressing  Set up;Sitting  Lower Body Dressing Total assistance;+2 for physical assistance;Sit to/from stand  Lower Body Dressing Details (indicate cue type and reason) +2 sit <>stand, relaint on B UE support and unable to reach L foot  Toilet Transfer Moderate assistance;Stand-pivot;+2 for physical assistance;RW  Toilet Transfer Details (indicate cue type and reason) simulated to recliner   Toileting- Clothing Manipulation and Hygiene Total assistance;+2 for physical assistance;Sit to/from stand  Toileting - Clothing Manipulation Details (indicate cue type and reason) relaint on B UE support in standing   Functional mobility during ADLs Minimal assistance;Moderate assistance;+2 for physical assistance;Rolling walker;Cueing for safety;Cueing for sequencing  General ADL Comments pt limited by TWB L LE, impaired balance, generalized weakness   Bed Mobility  Overal bed mobility Needs Assistance  Bed Mobility Supine to Sit  Supine to sit  Mod assist;+2 for safety/equipment;HOB elevated  General bed mobility comments requires assist to guide L LE towards EOB; use of bed rails and bed pad to turn hips at EOB  Transfers  Overall transfer level Needs assistance  Equipment used Rolling walker (2 wheeled)  Transfers Sit to/from [employer] Transfers  Sit to Stand Min assist;+2 safety/equipment;From elevated surface  Stand pivot transfers Mod assist;+2 physical assistance  General transfer comment Min A to stand at bedside with PT foot under patient L LE to ensure she maintains WB status; cueing for hand placement and sequencing; most difficulty with pivoting to chair with patient unable to hop - requires Mod A +2 to complete safely and manage RW  Balance  Overall balance assessment Needs assistance  Sitting-balance support Bilateral upper extremity supported;Feet supported  Sitting balance-Leahy Scale Fair  Standing balance support Bilateral upper extremity supported;During functional activity  Standing balance-Leahy Scale Poor  Standing balance comment bil UE support with TDWB status  OT - End of Session  Equipment Utilized During Treatment Gait belt;Rolling walker  Activity Tolerance Patient tolerated treatment well  Patient left in chair;with call bell/phone within reach;with chair alarm set  Nurse Communication Mobility status  OT Assessment  OT Recommendation/Assessment Patient needs continued OT Services  OT Visit Diagnosis Other abnormalities of gait and mobility (R26.89);Muscle weakness (generalized) (M62.81);Pain  Pain - Right/Left Left  Pain - part of body Leg  OT Problem List Decreased strength;Decreased activity tolerance;Impaired balance (sitting and/or standing);Decreased cognition;Decreased safety awareness;Decreased knowledge of use of DME or AE;Decreased knowledge of precautions;Pain  Barriers to Discharge  Inaccessible home environment  OT Plan  OT Frequency (ACUTE ONLY) Min 3X/week  OT Treatment/Interventions (ACUTE ONLY) Self-care/ADL training;Therapeutic exercise;DME and/or AE instruction;Therapeutic activities;Patient/family education;Balance training  AM-PAC OT "6 Clicks" Daily Activity Outcome Measure (Version 2)  Help from another person eating meals? 4  Help from another person taking care of personal grooming? 4 (seated)  Help from another person toileting, which includes using toliet, bedpan, or urinal? 1  Help from another person bathing (including washing, rinsing, drying)? 2  Help from another person to put on and taking off regular upper body clothing? 3  Help from another person to put on and taking off regular lower body clothing? 1  6 Click Score 15  OT Recommendation  Follow Up Recommendations SNF;Supervision/Assistance - 24 hour  OT Equipment 3 in 1 bedside commode  Individuals Consulted  Consulted and Agree with Results and Recommendations Patient  Acute Rehab OT Goals  Patient Stated Goal return home  OT Goal Formulation With patient  Time For Goal Achievement 11/20/18  Potential to Achieve Goals Good  OT Time Calculation  OT Start Time (ACUTE ONLY) 7782  OT Stop Time (ACUTE ONLY) 0849  OT Time Calculation (min) 28 min  OT General Charges  $OT Visit 1 Visit  OT Evaluation  $OT Eval Moderate Complexity 1 Mod  Written Expression  Dominant Hand Right   Delight Stare, OT Acute Rehabilitation Services Pager [phone] Office [phone]

## 2018-11-06 NOTE — Progress Notes (Addendum)
Progress Note  Patient Name: ALEAN KROMER Date of Encounter: 11/06/2018  Primary Cardiologist: Virl Axe, MD  Subjective   No CP  Breathing is OK    Inpatient Medications    Scheduled Meds: . acetaminophen  650 mg Oral Q6H  . brimonidine  1 drop Both Eyes TID  . cholecalciferol  2,000 Units Oral q morning - 10a  . enoxaparin (LOVENOX) injection  40 mg Subcutaneous Q24H  . feeding supplement (ENSURE ENLIVE)  237 mL Oral BID BM  . flecainide  100 mg Oral BID  . gabapentin  100 mg Oral TID  . insulin aspart  0-9 Units Subcutaneous TID WC  . insulin glargine  9 Units Subcutaneous Daily  . metoprolol tartrate  25 mg Oral BID  . multivitamin with minerals  1 tablet Oral Daily  . mupirocin ointment  1 application Nasal BID  . polyethylene glycol  17 g Oral Daily  . polyvinyl alcohol  1 drop Both Eyes BID  . senna-docusate  1 tablet Oral BID   Continuous Infusions: . sodium chloride Stopped (11/04/18 0246)  . lactated ringers 10 mL/hr at 11/04/18 1603  . methocarbamol (ROBAXIN) IV 500 mg (11/04/18 1623)   PRN Meds: bisacodyl, HYDROmorphone (DILAUDID) injection, magnesium citrate, methocarbamol **OR** methocarbamol (ROBAXIN) IV, nitroGLYCERIN, oxyCODONE, prochlorperazine   Vital Signs    Vitals:   11/05/18 1104 11/05/18 2010 11/06/18 0300 11/06/18 0330  BP: (!) 110/45 106/70 127/78   Pulse: 73  77 70  Resp: 12  12   Temp: 98.3 F (36.8 C) 98.2 F (36.8 C) 97.9 F (36.6 C)   TempSrc: Oral Oral Oral   SpO2: 98% 100% 99%   Weight:      Height:        Intake/Output Summary (Last 24 hours) at 11/06/2018 0719 Last data filed at 11/05/2018 1900 Gross per 24 hour  Intake 480 ml  Output 950 ml  Net -470 ml   Last 3 Weights 11/03/2018 11/03/2018 05/28/2018  Weight (lbs) 134 lb 7.7 oz 130 lb 132 lb 7.9 oz  Weight (kg) 61 kg 58.968 kg 60.1 kg      Telemetry    SR - Personally Reviewed  ECG      Physical Exam   GEN: No acute distress.   Neck: No JVD  Cardiac: RRR, no murmurs, rubs, or gallops.  Respiratory: Clear to auscultation bilaterally. GI: Soft, nontender, non-distended  MS: No edema; L leg bandaged Neuro:  Nonfocal  Psych: Normal affect   Labs    Chemistry Recent Labs  Lab 11/03/18 1723  NA 137  K 3.7  CL 101  CO2 23  GLUCOSE 83  BUN 16  CREATININE 0.94  CALCIUM 9.0  PROT 6.9  ALBUMIN 4.0  AST 28  ALT 17  ALKPHOS 61  BILITOT 0.5  GFRNONAA 59*  GFRAA >60  ANIONGAP 13     Hematology Recent Labs  Lab 11/03/18 1723 11/05/18 0318 11/06/18 0215  WBC 13.8* 11.9* 11.1*  RBC 4.28 3.60* 3.26*  HGB 12.7 10.8* 9.8*  HCT 40.0 33.5* 30.5*  MCV 93.5 93.1 93.6  MCH 29.7 30.0 30.1  MCHC 31.8 32.2 32.1  RDW 12.8 12.9 12.6  PLT 254 180 168    Cardiac Enzymes Recent Labs  Lab 11/03/18 1723 11/03/18 1832 11/04/18 1433 11/05/18 0925  TROPONINI 0.46* 0.45* 0.85* 0.68*   No results for input(s): TROPIPOC in the last 168 hours.   BNPNo results for input(s): BNP, PROBNP in the last 168 hours.  DDimer No results for input(s): DDIMER in the last 168 hours.   Radiology    Dg C-arm 1-60 Min  Result Date: 11/04/2018 CLINICAL DATA:  ORIF distal femur fracture EXAM: DG C-ARM 61-120 MIN; LEFT FEMUR 2 VIEWS COMPARISON:  11/03/2018 FINDINGS: Multiple intraoperative spot images demonstrate plate and screw fixation across the distal femoral fracture. Near anatomic alignment. No hardware complicating feature. IMPRESSION: Internal fixation across the distal left femoral fracture with near anatomic alignment. Electronically Signed   By: Rolm Baptise M.D.   On: 11/04/2018 19:10   Dg Femur Min 2 Views Left  Result Date: 11/04/2018 CLINICAL DATA:  ORIF distal femur fracture EXAM: DG C-ARM 61-120 MIN; LEFT FEMUR 2 VIEWS COMPARISON:  11/03/2018 FINDINGS: Multiple intraoperative spot images demonstrate plate and screw fixation across the distal femoral fracture. Near anatomic alignment. No hardware complicating feature.  IMPRESSION: Internal fixation across the distal left femoral fracture with near anatomic alignment. Electronically Signed   By: Rolm Baptise M.D.   On: 11/04/2018 19:10   Dg Femur Port Min 2 Views Left  Result Date: 11/04/2018 CLINICAL DATA:  Postop EXAM: LEFT FEMUR PORTABLE 2 VIEWS COMPARISON:  11/03/2018 FINDINGS: Plate and screw fixation device noted across the distal femoral fracture. No hardware complicating feature. Near anatomic alignment. IMPRESSION: Internal fixation of the distal femoral fracture with near anatomic alignment. Electronically Signed   By: Rolm Baptise M.D.   On: 11/04/2018 20:40    Cardiac Studies   Echo IMPRESSIONS    1. The left ventricle has normal systolic function with an ejection fraction of 60-65%. The cavity size was normal. Left ventricular diastolic Doppler parameters are consistent with pseudonormalization. No evidence of left ventricular regional wall  motion abnormalities.  2. The right ventricle has normal systolic function. The cavity was normal. There is no increase in right ventricular wall thickness.  3. Left atrial size was mildly dilated.  4. Right atrial size was mildly dilated.  5. Trivial pericardial effusion is present.  6. There is mild mitral annular calcification present. No evidence of mitral valve stenosis. Trivial mitral regurgitation.  7. The aortic valve is tricuspid. Mild calcification of the aortic valve. No stenosis of the aortic valve.  8. The aortic root is normal in size and structure.  9. Normal IVC size. PA systolic pressure 36 mmHg.  FINDINGS  Left Ventricle: The left ventricle has normal systolic function, with an ejection fraction of 60-65%. The cavity size was normal. There is no increase in left ventricular wall thickness. Left ventricular diastolic Doppler parameters are consistent with  pseudonormalization. No evidence of left ventricular regional wall motion abnormalities..  Right Ventricle: The right ventricle  has normal systolic function. The cavity was normal. There is no increase in right ventricular wall thickness.  Left Atrium: Left atrial size was mildly dilated.  Right Atrium: Right atrial size was mildly dilated. Right atrial pressure is estimated at 10 mmHg.  Interatrial Septum: No atrial level shunt detected by color flow Doppler.  Pericardium: Trivial pericardial effusion is present.  Mitral Valve: The mitral valve is normal in structure. There is mild mitral annular calcification present. Mitral valve regurgitation is trivial by color flow Doppler. No evidence of mitral valve stenosis.  Tricuspid Valve: The tricuspid valve is normal in structure. Tricuspid valve regurgitation is trivial by color flow Doppler.  Aortic Valve: The aortic valve is tricuspid Mild calcification of the aortic valve. Aortic valve regurgitation was not visualized by color flow Doppler. There is No stenosis of the aortic  valve, with a calculated valve area of 2.32 cm.  Pulmonic Valve: The pulmonic valve was normal in structure. Pulmonic valve regurgitation is trivial by color flow Doppler.  Aorta: The aortic root is normal in size and structure.  Venous: The inferior vena cava is normal in size with greater than 50% respiratory variability.    +--------------+--------++ LEFT VENTRICLE              +----------------+---------++ +--------------+--------++      Diastology                PLAX 2D                     +----------------+---------++ +--------------+--------++      LV e' lateral:  9.46 cm/s LVIDd:        4.30 cm       +----------------+---------++ +--------------+--------++      LV E/e' lateral:9.3       LVIDs:        3.30 cm       +----------------+---------++ +--------------+--------++      LV e' medial:   5.22 cm/s LV PW:        0.60 cm       +----------------+---------++ +--------------+--------++      LV E/e' medial: 16.9      LV IVS:        1.00 cm       +----------------+---------++ +--------------+--------++ LVOT diam:    2.00 cm  +--------------+--------++ LV SV:        39 ml    +--------------+--------++ LV SV Index:  23.37    +--------------+--------++ LVOT Area:    3.14 cm +--------------+--------++                        +--------------+--------++   +------------------+---------++ LV Volumes (MOD)            +------------------+---------++ LV area d, A2C:   27.40 cm +------------------+---------++ LV area d, A4C:   27.50 cm +------------------+---------++ LV area s, A2C:   14.40 cm +------------------+---------++ LV area s, A4C:   16.10 cm +------------------+---------++ LV major d, A2C:  7.34 cm   +------------------+---------++ LV major d, A4C:  7.32 cm   +------------------+---------++ LV major s, A2C:  6.41 cm   +------------------+---------++ LV major s, A4C:  6.36 cm   +------------------+---------++ LV vol d, MOD A2C:85.4 ml   +------------------+---------++ LV vol d, MOD A4C:84.6 ml   +------------------+---------++ LV vol s, MOD A2C:28.2 ml   +------------------+---------++ LV vol s, MOD A4C:34.2 ml   +------------------+---------++ LV SV MOD A2C:    57.2 ml   +------------------+---------++ LV SV MOD A4C:    84.6 ml   +------------------+---------++ LV SV MOD BP:     54.1 ml   +------------------+---------++  +---------------+----------++ RIGHT VENTRICLE           +---------------+----------++ RV Basal diam: 3.10 cm    +---------------+----------++ RV Mid diam:   2.80 cm    +---------------+----------++ RV S prime:    12.75 cm/s +---------------+----------++ TAPSE (M-mode):2.1 cm     +---------------+----------++ RVSP:          36.4 mmHg  +---------------+----------++  +-------------+-------++-----------++ LEFT ATRIUM         Index        +-------------+-------++-----------++ LA diam:     3.10 cm1.88 cm/m  +-------------+-------++-----------++ LA Vol (A2C):25.2 ml15.25 ml/m +-------------+-------++-----------++ LA Vol (A4C):58.7 ml35.52 ml/m +-------------+-------++-----------++ +------------+---------++-----------++ RIGHT ATRIUM         Index       +------------+---------++-----------++  RA Pressure:3.00 mmHg            +------------+---------++-----------++ RA Area:    21.20 cm            +------------+---------++-----------++ RA Volume:  64.90 ml 39.27 ml/m +------------+---------++-----------++  +------------------+-----------++ +--------------+--------++ AORTIC VALVE                  PULMONIC VALVE         +------------------+-----------++ +--------------+--------++ AV Area (Vmax):   2.37 cm    PV Vmax:      0.67 m/s +------------------+-----------++ +--------------+--------++ AV Area (Vmean):  2.42 cm    PV Peak grad: 1.8 mmHg +------------------+-----------++ +--------------+--------++ AV Area (VTI):    2.32 cm    +------------------+-----------++ AV Vmax:          115.00 cm/s +------------------+-----------++ AV Vmean:         85.200 cm/s +------------------+-----------++ AV VTI:           0.299 m     +------------------+-----------++ AV Peak Grad:     5.3 mmHg    +------------------+-----------++ AV Mean Grad:     3.0 mmHg    +------------------+-----------++ LVOT Vmax:        86.80 cm/s  +------------------+-----------++ LVOT Vmean:       65.700 cm/s +------------------+-----------++ LVOT VTI:         0.221 m     +------------------+-----------++ LVOT/AV VTI ratio:0.74        +------------------+-----------++   +-------------+-------++ AORTA                +-------------+-------++ Ao Root diam:2.80 cm +-------------+-------++ Ao Asc diam: 3.40 cm  +-------------+-------++  +--------------+----------++ +---------------+-----------++ MITRAL VALVE             TRICUSPID VALVE            +--------------+----------++ +---------------+-----------++ MV Area (PHT):5.02 cm   TR Peak grad:  33.4 mmHg   +--------------+----------++ +---------------+-----------++ MV PHT:       43.79 msec TR Vmax:       289.00 cm/s +--------------+----------++ +---------------+-----------++ MV Decel Time:151 msec   Estimated RAP: 3.00 mmHg   +--------------+----------++ +---------------+-----------++ +--------------+----------++ RVSP:          36.4 mmHg   MV E velocity:88.30 cm/s +---------------+-----------++ +--------------+----------++ MV A velocity:60.80 cm/s +--------------+-------+ +--------------+----------++ SHUNTS                MV E/A ratio: 1.45       +--------------+-------+ +--------------+----------++ Systemic VTI: 0.22 m                               +--------------+-------+                              Systemic Diam:2.00 cm                              +--------------+-------+    Loralie Champagne MD Electronically signed by Loralie Champagne MD Signature Date/Time: 11/04/2018/2:29:48 PM      Patient Profile     78 y.o. female WINDSOR ZIRKELBACH is a 78 y.o. 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 Lonny Prude.   Assessment & Plan    1  Preop risk stratifications  Pt being evaluated for femur Fx   No hx of angina  SR   Echo with normal LVEF    Has had minimal elevation of trop   Has been up in past.   From cardiac standpoint at rel low risk for major cardiac event  No evid  of ischemia    2  CAD   Nonobstructive by cath in 2001.   Low risk NST in Jan 2015 and 2018  No CP   Trop 0.46    As above  No further testing    2   PAF  Remains in SR   3  HTN  BP is OK      For questions or updates, please contact [facility] Please consult [email or web address] for contact info under        Signed, Dorris Carnes, MD  11/06/2018, 7:19 AM
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
## 2018-11-07 ENCOUNTER — Encounter (HOSPITAL_COMMUNITY): Payer: Self-pay | Admitting: Student

## 2018-11-07 LAB — GLUCOSE, CAPILLARY
Glucose-Capillary: 439 mg/dL — ABNORMAL HIGH (ref 70–99)
Glucose-Capillary: 401 mg/dL — ABNORMAL HIGH (ref 70–99)
Glucose-Capillary: 199 mg/dL — ABNORMAL HIGH (ref 70–99)
Glucose-Capillary: 127 mg/dL — ABNORMAL HIGH (ref 70–99)
Glucose-Capillary: 155 mg/dL — ABNORMAL HIGH (ref 70–99)
Glucose-Capillary: 243 mg/dL — ABNORMAL HIGH (ref 70–99)
Glucose-Capillary: 143 mg/dL — ABNORMAL HIGH (ref 70–99)

## 2018-11-07 LAB — CBC
HCT: 25.5 % — ABNORMAL LOW (ref 36.0–46.0)
Hemoglobin: 8.4 g/dL — ABNORMAL LOW (ref 12.0–15.0)
MCH: 30.1 pg (ref 26.0–34.0)
MCHC: 32.9 g/dL (ref 30.0–36.0)
MCV: 91.4 fL (ref 80.0–100.0)
Platelets: 162 10*3/uL (ref 150–400)
RBC: 2.79 MIL/uL — ABNORMAL LOW (ref 3.87–5.11)
RDW: 12.4 % (ref 11.5–15.5)
WBC: 6.7 10*3/uL (ref 4.0–10.5)
nRBC: 0 % (ref 0.0–0.2)

## 2018-11-07 MED ORDER — INSULIN ASPART 100 UNIT/ML ~~LOC~~ SOLN
10.0000 [IU] | Freq: Once | SUBCUTANEOUS | Status: AC
  Administered 2018-11-07: 10 [IU] via SUBCUTANEOUS

## 2018-11-07 MED ORDER — INSULIN ASPART 100 UNIT/ML ~~LOC~~ SOLN
0.0000 [IU] | SUBCUTANEOUS | Status: DC
  Administered 2018-11-07: 3 [IU] via SUBCUTANEOUS
  Administered 2018-11-07: 9 [IU] via SUBCUTANEOUS
  Administered 2018-11-07: 2 [IU] via SUBCUTANEOUS
  Administered 2018-11-08: 1 [IU] via SUBCUTANEOUS
  Administered 2018-11-08: 2 [IU] via SUBCUTANEOUS
  Administered 2018-11-08: 1 [IU] via SUBCUTANEOUS
  Administered 2018-11-08: 5 [IU] via SUBCUTANEOUS

## 2018-11-07 MED ORDER — INSULIN GLARGINE 100 UNIT/ML ~~LOC~~ SOLN
5.0000 [IU] | Freq: Once | SUBCUTANEOUS | Status: AC
  Administered 2018-11-07: 5 [IU] via SUBCUTANEOUS
  Filled 2018-11-07 (×2): qty 0.05

## 2018-11-07 MED ORDER — TRAMADOL HCL 50 MG PO TABS: 50.0000 mg | ORAL_TABLET | Freq: Four times a day (QID) | ORAL | 0 refills | Status: DC | PRN

## 2018-11-07 MED ORDER — INSULIN ASPART 100 UNIT/ML ~~LOC~~ SOLN
3.0000 [IU] | Freq: Three times a day (TID) | SUBCUTANEOUS | Status: DC
  Administered 2018-11-07 – 2018-11-08 (×5): 3 [IU] via SUBCUTANEOUS

## 2018-11-07 MED ORDER — ENOXAPARIN SODIUM 40 MG/0.4ML ~~LOC~~ SOLN: 40.0000 mg | SUBCUTANEOUS | 0 refills | Status: DC

## 2018-11-07 NOTE — Care Management Important Message (Signed)
Important Message  Patient Details  Name: Barbara [name] MRN: [id number] Date of Birth: 07-30-1940   Medicare Important Message Given:       Memory Argue 11/07/2018, 1:34 PM

## 2018-11-07 NOTE — Progress Notes (Signed)
Physical Therapy Treatment Patient Details Name: Barbara [name] MRN: [id number] DOB: [birth year] Today's Date: 11/07/2018    History of Present Illness 78 yo admitted after fall with left femur fx s/p ORIF on 6/13. PMhx: PAF, hypertension, history of CVA, mild asthma, chronic back pain, type 2 diabetes, GERD, glaucoma, macular degeneration, hyperlipidemia    PT Comments    Pt admitted with above diagnosis. Pt currently with functional limitations due to balance and endurance deficits. Pt was able to pivot x 2  with RW with min guard assist to min assist.  Maintains TDWB left LE with cues.    Pt will benefit from skilled PT to increase their independence and safety with mobility to allow discharge to the venue listed below.     Follow Up Recommendations  SNF;Supervision/Assistance - 24 hour     Equipment Recommendations  Wheelchair (measurements PT)    Recommendations for Other Services OT consult     Precautions / Restrictions Precautions Precautions: Fall Restrictions Weight Bearing Restrictions: Yes LLE Weight Bearing: Touchdown weight bearing    Mobility  Bed Mobility Overal bed mobility: Needs Assistance Bed Mobility: Supine to Sit     Supine to sit: HOB elevated;Min assist     General bed mobility comments: requires assist to guide L LE towards EOB; use of bed rails and bed pad to turn hips at EOB  Transfers Overall transfer level: Needs assistance Equipment used: Rolling walker (2 wheeled) Transfers: Sit to/from [facility] Sit to Stand: Min assist;Min guard Stand pivot transfers: Min assist;Min guard       General transfer comment: Min guard A to stand at bedside and pivot to 3N1.  Able to maintain TDWB with cues.    cueing for hand placement and sequencing; Was able to pivot to recliner after gettting on 3N1 with min to min guard assist to take pivotal steps to chair. Needed assist to move the RW.   Ambulation/Gait             General  Gait Details: TBA   Stairs             Wheelchair Mobility    Modified Rankin (Stroke Patients Only)       Balance Overall balance assessment: Needs assistance Sitting-balance support: Bilateral upper extremity supported;Feet supported Sitting balance-Leahy Scale: Fair     Standing balance support: Bilateral upper extremity supported;During functional activity Standing balance-Leahy Scale: Poor Standing balance comment: bil UE support with TDWB status                            Cognition Arousal/Alertness: Awake/alert Behavior During Therapy: WFL for tasks assessed/performed Overall Cognitive Status: Impaired/Different from baseline Area of Impairment: Following commands;Safety/judgement;Problem solving                 Orientation Level: Disoriented to;Time   Memory: Decreased short-term memory Following Commands: Follows one step commands with increased time Safety/Judgement: Decreased awareness of safety;Decreased awareness of deficits Awareness: Emergent Problem Solving: Slow processing;Decreased initiation;Requires verbal cues;Requires tactile cues        Exercises General Exercises - Lower Extremity Ankle Circles/Pumps: AAROM;Left;10 reps;Supine Quad Sets: AROM;Both;10 reps Long Arc Quad: AAROM;Left;Seated;10 reps Heel Slides: AAROM;AROM;Both;5 reps(AAROM on LLE)    General Comments        Pertinent Vitals/Pain Pain Assessment: Faces Faces Pain Scale: Hurts whole lot Pain Location: left leg Pain Descriptors / Indicators: Aching;Discomfort;Operative site guarding Pain Intervention(s): Limited activity within patient's tolerance;Monitored  during session;Repositioned    Home Living                      Prior Function            PT Goals (current goals can now be found in the care plan section) Acute Rehab PT Goals Patient Stated Goal: return home Progress towards PT goals: Progressing toward goals     Frequency    Min 4X/week      PT Plan Current plan remains appropriate    Co-evaluation              AM-PAC PT "6 Clicks" Mobility   Outcome Measure  Help needed turning from your back to your side while in a flat bed without using bedrails?: A Little Help needed moving from lying on your back to sitting on the side of a flat bed without using bedrails?: A Little Help needed moving to and from a bed to a chair (including a wheelchair)?: A Little Help needed standing up from a chair using your arms (e.g., wheelchair or bedside chair)?: A Little Help needed to walk in hospital room?: Total Help needed climbing 3-5 steps with a railing? : Total 6 Click Score: 14    End of Session Equipment Utilized During Treatment: Gait belt Activity Tolerance: Patient tolerated treatment well Patient left: in chair;with call bell/phone within reach;with chair alarm set Nurse Communication: Mobility status;Weight bearing status PT Visit Diagnosis: Other abnormalities of gait and mobility (R26.89);Muscle weakness (generalized) (M62.81);Difficulty in walking, not elsewhere classified (R26.2);History of falling (Z91.81)     Time: 2458-0998 PT Time Calculation (min) (ACUTE ONLY): 15 min  Charges:  $Therapeutic Activity: 8-22 mins                     [place] Pager:  [phone]  Office:  [place] 11/07/2018, 12:03 PM

## 2018-11-07 NOTE — Progress Notes (Signed)
Orthopedic Tech Progress Note Patient Details:  [name] [birth year] [id number]   Called in order to [facility] for ANKLE FOOT ORTHOSIS bilateral for patient.  Patient ID: Barbara [name], female   DOB: January 03, 1941, 77 y.o.   MRN: [id number]   Janit Pagan 11/07/2018, 1:45 PM

## 2018-11-07 NOTE — TOC Progression Note (Signed)
Transition of Care [facility]) - Progression Note    Patient Details  Name: BRAYLI KLINGBEIL MRN: [id number] Date of Birth: 1941-01-11  Transition of Care [facility]) CM/SW [place], [place] Phone Number: 11/07/2018, 11:28 AM  Clinical Narrative:     CSW visit with the patient at bedside. Patient refused SNF and states she wants to go home. CSW spoke with her daughter, Manuela Schwartz and she agrees with the patient returning home. Patient's daughter states her daughter and son in law are ICU travel nurses and they will be able to assist and care for the patient at home.   Thurmond Butts, MSW, [facility] Clinical Social Worker [phone]   Expected Discharge Plan: Skilled Nursing Facility Barriers to Discharge: Insurance Authorization, Continued Medical Work up  Expected Discharge Plan and Services Expected Discharge Plan: [place] In-house Referral: Clinical Social Work Discharge Planning Services: NA Post Acute Care Choice: [place] Living arrangements for the past 2 months: Single Family Home                                       Social Determinants of Health (SDOH) Interventions    Readmission Risk Interventions No flowsheet data found.
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
## 2018-11-07 NOTE — Progress Notes (Signed)
Pt comfortable   Remains in SR    Cardiac status has remained stable    WIll sign off.   Dorris Carnes MD

## 2018-11-07 NOTE — Progress Notes (Signed)
Orthopaedic Trauma Progress Note  S: Patient doing well today,pain much improved from yesterday. Working well with therapy.  Patient holding both feet in quite a bit of plantarflexion, have ordered bilateral PRAFO boots to help prevent equinus contracture while laying in bed.   O:  Vitals:   11/07/18 0300 11/07/18 0808  BP: (!) 128/36 (!) 110/48  Pulse: (!) 59 61  Resp: 12 12  Temp: 98 F (36.7 C) 98.3 F (36.8 C)  SpO2: 100% 97%    General - Sitting up in bed. NAD. Alert and oriented. Pleasant and cooperative Cardiac - Heart RRR Respiratory - No increased work of breathing, CTA anterior lung fields bilaterally Left Lower Extremity - Dressing removed, incisions c/d/i. Some swelling of the knee noted. Holding her foot in a bit of plantarflexion. Moderately tender to palpation of knee and lateral thigh. Non-tender in left hip or in lower leg. Compartments soft and compressible. Extremity warm. Dorsiflexion/plantarflexion intact. Sensory and motor function intact distally. +DP pulse  Imaging: Stable post op imaging.   Labs:  Results for orders placed or performed during the hospital encounter of 11/03/18 (from the past 24 hour(s))  Glucose, capillary     Status: Abnormal   Collection Time: 11/06/18 11:36 AM  Result Value Ref Range   Glucose-Capillary 188 (H) 70 - 99 mg/dL   Comment 1 Notify RN    Comment 2 Document in Chart   Glucose, capillary     Status: Abnormal   Collection Time: 11/06/18  4:42 PM  Result Value Ref Range   Glucose-Capillary 161 (H) 70 - 99 mg/dL   Comment 1 Notify RN    Comment 2 Document in Chart   Glucose, capillary     Status: Abnormal   Collection Time: 11/06/18  8:59 PM  Result Value Ref Range   Glucose-Capillary 312 (H) 70 - 99 mg/dL  CBC     Status: Abnormal   Collection Time: 11/07/18  3:03 AM  Result Value Ref Range   WBC 6.7 4.0 - 10.5 K/uL   RBC 2.79 (L) 3.87 - 5.11 MIL/uL   Hemoglobin 8.4 (L) 12.0 - 15.0 g/dL   HCT 25.5 (L) 36.0 - 46.0 %    MCV 91.4 80.0 - 100.0 fL   MCH 30.1 26.0 - 34.0 pg   MCHC 32.9 30.0 - 36.0 g/dL   RDW 12.4 11.5 - 15.5 %   Platelets 162 150 - 400 K/uL   nRBC 0.0 0.0 - 0.2 %  Glucose, capillary     Status: Abnormal   Collection Time: 11/07/18  6:16 AM  Result Value Ref Range   Glucose-Capillary 439 (H) 70 - 99 mg/dL    Assessment: 78 year old female s/p fall of unknown etiology  Injuries: Left distal femur fracture s/p ORIF  Weightbearing: TDWB LLE x 2 weeks  Insicional and dressing care: Dressing c/d/i.   Orthopedic device(s): none right now. Ordered bilateral PRAFO boots this AM  CV/Blood loss: Acute blood loss anemia, Hgb 8.4 this AM. Continuing to drop. Continue to monitor.   Pain management:  1. Tylenol 650 mg q 6 hours scheduled 2. Robaxin 500 mg q 6 hours PRN 3. Oxycodone 5-10 mg q 4 hours PRN 4. Neurontin 100 mg TID 5. Dilaudid 1 mg q 3 hours PRN 6. Toradol 15 mg q 6 hours x 5 doses  VTE prophylaxis: Lovenox  ID:  Ancef 2gm post op completed  Foley/Lines: No foley, KVO IVFs  Medical co-morbidities: history of coronary vascular disease, diabetes type  2, atrial fibrillation, history of stroke, and hypertension   Impediments to Fracture Healing: Vitamin D deficiency, increased D3 supplement to 2000 IU daily  Dispo: PT/OT eval, recommending SNF. Would continue to monitor CBC from hgb drop. Okay to discharge from ortho standpoint once cleared by medicine team and therapy.  Follow - up plan: 2 weeks for suture removal and repeat x-rays   Jisella Ashenfelter A. [facility] ?[phone]? (phone)

## 2018-11-07 NOTE — Progress Notes (Signed)
Inpatient Diabetes Program Recommendations  AACE/ADA: New Consensus Statement on Inpatient Glycemic Control (2015)  Target Ranges:  Prepandial:   less than 140 mg/dL      Peak postprandial:   less than 180 mg/dL (1-2 hours)      Critically ill patients:  140 - 180 mg/dL   Lab Results  Component Value Date   GLUCAP 199 (H) 11/07/2018   HGBA1C 6.4 (H) 09/27/2017    Review of Glycemic Control Results for Barbara [name], Barbara [name] (MRN [id number]) as of 11/07/2018 15:22  Ref. Range 11/06/2018 20:59 11/07/2018 06:16 11/07/2018 08:06 11/07/2018 11:48  Glucose-Capillary Latest Ref Range: 70 - 99 mg/dL 312 (H) 439 (H) 401 (H) 199 (H)   Diabetes history: DM 2 Outpatient Diabetes medications:  Lantus 14 units daily, Novolin R 6 units tid with meals Current orders for Inpatient glycemic control:  Novolog sensitive q 4 hours Lantus 14 units daily Novolog 3 units tid with meals Inpatient Diabetes Program Recommendations:    Agree with current orders. Patient did not get ordered dose of Lantus on 6/15.  Blood sugar improved at lunchtime.  Will follow.   Thanks  Adah Perl, RN, BC-ADM Inpatient Diabetes Coordinator Pager [phone] (8a-5p)

## 2018-11-08 DIAGNOSIS — I10 Essential (primary) hypertension: Secondary | ICD-10-CM

## 2018-11-08 LAB — GLUCOSE, CAPILLARY
Glucose-Capillary: 146 mg/dL — ABNORMAL HIGH (ref 70–99)
Glucose-Capillary: 199 mg/dL — ABNORMAL HIGH (ref 70–99)
Glucose-Capillary: 268 mg/dL — ABNORMAL HIGH (ref 70–99)

## 2018-11-08 LAB — CBC
HCT: 25.9 % — ABNORMAL LOW (ref 36.0–46.0)
Hemoglobin: 8.6 g/dL — ABNORMAL LOW (ref 12.0–15.0)
MCH: 30.1 pg (ref 26.0–34.0)
MCHC: 33.2 g/dL (ref 30.0–36.0)
MCV: 90.6 fL (ref 80.0–100.0)
Platelets: 181 10*3/uL (ref 150–400)
RBC: 2.86 MIL/uL — ABNORMAL LOW (ref 3.87–5.11)
RDW: 12.4 % (ref 11.5–15.5)
WBC: 8.6 10*3/uL (ref 4.0–10.5)
nRBC: 0 % (ref 0.0–0.2)

## 2018-11-08 MED ORDER — ASPIRIN 325 MG PO TABS: 162.5000 mg | ORAL_TABLET | Freq: Every day | ORAL | Status: AC

## 2018-11-08 NOTE — Plan of Care (Signed)

## 2018-11-08 NOTE — TOC Transition Note (Addendum)
Transition of Care [facility]) - CM/SW Discharge Note   Patient Details  Name: Barbara [name] MRN: [id number] Date of Birth: 05-11-1941  [facility] CM/SW Contact:  Maryclare Labrador, RN Phone Number: 11/08/2018, 10:43 AM   Clinical Narrative:   Pt will discharge home today in care of daughter.  Pts daughter will provide recommended supervision.  Pt in agreement with both DME recommended and HH as recommended - CM provided medicare.gov HH and DME list verbally - pt chose [facility] for DME and [facility] for [facility] - both agencies accepted pt.      Final next level of care: [place] Barriers to Discharge: Barriers Resolved   Patient Goals and CMS Choice Patient states their goals for this hospitalization and ongoing recovery are:: Pt is eager to get to her daughters house so she can heal in a calming envrionment CMS Medicare.gov Compare Post Acute Care list provided to:: Patient Choice offered to / list presented to : Patient  Discharge Placement                       Discharge Plan and Services In-house Referral: Clinical Social Work Discharge Planning Services: NA Post Acute Care Choice: [place]          DME Arranged: Youth worker wheelchair with seat cushion, Environmental consultant, 3:1 DME Agency: [facility] Date DME Agency Contacted: 11/08/18 Time DME Agency Contacted: 52 Representative spoke with at DME Agency: [place]: PT, RN, OT, Nurse's Aide, Social Work [employer] Agency: [place] Date [place]: 11/08/18 Time [place]: 1042 Representative spoke with at [place]: [place] Interventions     Readmission Risk Interventions No flowsheet data found.

## 2018-11-08 NOTE — Progress Notes (Signed)
    Durable Medical Equipment  (From admission, onward)         Start     Ordered   11/08/18 1010  For home use only DME lightweight manual wheelchair with seat cushion  Once    Comments: Patient suffers from left femur fracture which impairs their ability to perform daily activities like bathing, dressing and toileting in the home.  A cane, crutch or walker will not resolve issue with performing activities of daily living. A wheelchair will allow patient to safely perform daily activities. Patient can safely propel the wheelchair in the home or has a caregiver who can provide assistance. Length of need 6 months . Accessories: elevating leg rests (ELRs), wheel locks, extensions and anti-tippers.   11/08/18 1010         Cordelia Poche, MD [facility] 11/08/2018, 10:10 AM

## 2018-11-08 NOTE — Care Management (Signed)
Patient suffers from left femur fracture which impairs their ability to perform daily activities like bathing, dressing and toileting in the home. A cane, crutch or walker will not resolve issue with performing activities of daily living. A wheelchair will allow patient to safely perform daily activities. Patient can not safely propel in a standard wheelchair in the home due to weakness. Pt can self propel in a lightweight wheelchair. Length of need 6 months .  Accessories: elevating leg rests (ELRs), wheel locks, extensions and anti-tippers.

## 2018-11-08 NOTE — Progress Notes (Signed)
Pt's daughter phoned unit @2pm  stating her mother was "screaming with pain" and that the Rx for Tramadol was not sufficent RN told daughter that Pt received 10 mg oxycodone at 12 noon prior to d/c. RN will contact Dr. Lonny Prude to see if further pain management can be arranged for pt.

## 2018-11-08 NOTE — Progress Notes (Signed)
Occupational Therapy Treatment Patient Details Name: Barbara [name] MRN: [id number] DOB: [birth year] Today's Date: 11/08/2018    History of present illness 78 yo admitted after fall with left femur fx s/p ORIF on 6/13. PMhx: PAF, hypertension, history of CVA, mild asthma, chronic back pain, type 2 diabetes, GERD, glaucoma, macular degeneration, hyperlipidemia   OT comments  Patient agreeable to OT, reports dcing home today with 24/7 support. Completing bed mobility with min assist, toilet transfers with min assist using 3:1 and RW, and toileting with mod assist.  Patient continues to require cueing for techniques, safety, adherence to TWB L LE, but improved awareness of need for assistance due to safety and deficits.  With 24/7 support, dc recommendations changed as below as pt is declining SNF. Patient agreeable to assist during all self care/mobility tasks.    Follow Up Recommendations  Home health OT;Supervision/Assistance - 24 hour;Other (comment)(HH aide--Pt declining SNF )    Equipment Recommendations  3 in 1 bedside commode    Recommendations for Other Services      Precautions / Restrictions Precautions Precautions: Fall Restrictions Weight Bearing Restrictions: Yes LLE Weight Bearing: Touchdown weight bearing       Mobility Bed Mobility Overal bed mobility: Needs Assistance Bed Mobility: Supine to Sit     Supine to sit: HOB elevated;Min assist     General bed mobility comments: used of rail, requires min assist to guide L LE and min guard for safety   Transfers Overall transfer level: Needs assistance Equipment used: Rolling walker (2 wheeled) Transfers: Sit to/from [facility] Sit to Stand: Min guard Stand pivot transfers: Min assist       General transfer comment: min guard for sit to stand, min assist for pivotal steps to [facility] and recliner; therapist placing foot under  LLE to ensure TWB and requires cueing to maintain; assist to manage RW and  cueing for hand placement     Balance Overall balance assessment: Needs assistance Sitting-balance support: Feet supported;No upper extremity supported Sitting balance-Leahy Scale: Fair     Standing balance support: Bilateral upper extremity supported;During functional activity Standing balance-Leahy Scale: Poor Standing balance comment: relaint on B UE support in standing with TWB                            ADL either performed or assessed with clinical judgement   ADL Overall ADL's : Needs assistance/impaired                     Lower Body Dressing: Total assistance;Sit to/from stand Lower Body Dressing Details (indicate cue type and reason): sit to stand min assist, reliant on BUE support; total assist for socks Toilet Transfer: Minimal assistance;Stand-pivot;BSC;RW   Toileting- Clothing Manipulation and Hygiene: Moderate assistance;Sit to/from stand Toileting - Clothing Manipulation Details (indicate cue type and reason): assist for clothing mgmt, pt squatting for hygiene with cueing to ensure TWB L LE      Functional mobility during ADLs: Minimal assistance;Rolling walker;Cueing for safety;Cueing for sequencing General ADL Comments: continues to be limited by pain, adherance to TWB L LE     Vision       Perception     Praxis      Cognition Arousal/Alertness: Awake/alert Behavior During Therapy: WFL for tasks assessed/performed Overall Cognitive Status: Impaired/Different from baseline Area of Impairment: Following commands;Safety/judgement;Problem solving  Following Commands: Follows one step commands consistently;Follows one step commands with increased time Safety/Judgement: Decreased awareness of safety   Problem Solving: Slow processing;Decreased initiation;Requires verbal cues;Requires tactile cues;Difficulty sequencing General Comments: pt requires increased time to follow commands, awareness to deficits  improving- she knows she needs assistance for all ADLs/mobility        Exercises     Shoulder Instructions       General Comments      Pertinent Vitals/ Pain       Pain Assessment: Faces Faces Pain Scale: Hurts even more Pain Location: left leg Pain Descriptors / Indicators: Aching;Discomfort;Operative site guarding Pain Intervention(s): Monitored during session;Repositioned;Limited activity within patient's tolerance  Home Living                                          Prior Functioning/Environment              Frequency  Min 3X/week        Progress Toward Goals  OT Goals(current goals can now be found in the care plan section)  Progress towards OT goals: Progressing toward goals  Acute Rehab OT Goals Patient Stated Goal: return home OT Goal Formulation: With patient  Plan Discharge plan needs to be updated;Frequency remains appropriate    Co-evaluation                 AM-PAC OT "6 Clicks" Daily Activity     Outcome Measure   Help from another person eating meals?: None Help from another person taking care of personal grooming?: None(seated) Help from another person toileting, which includes using toliet, bedpan, or urinal?: A Lot Help from another person bathing (including washing, rinsing, drying)?: A Lot Help from another person to put on and taking off regular upper body clothing?: None Help from another person to put on and taking off regular lower body clothing?: Total 6 Click Score: 17    End of Session Equipment Utilized During Treatment: Gait belt;Rolling walker  OT Visit Diagnosis: Other abnormalities of gait and mobility (R26.89);Muscle weakness (generalized) (M62.81);Pain Pain - Right/Left: Left Pain - part of body: Leg   Activity Tolerance Patient tolerated treatment well   Patient Left in chair;with call bell/phone within reach;with chair alarm set;with nursing/sitter in room   Nurse Communication Mobility  status        Time: 7579-7282 OT Time Calculation (min): 26 min  Charges: OT General Charges $OT Visit: 1 Visit OT Treatments $Self Care/Home Management : 23-37 mins  Delight Stare, [place] Pager [phone] Office [phone]    Delight Stare 11/08/2018, 9:14 AM

## 2018-11-08 NOTE — Progress Notes (Signed)
Pt left unit @1251  today . Pain meds were called in to orthopedic surgeon  Haddix who said he will call in pain meds to pharmacy, Daughter Manuela Schwartz called and was updated.

## 2018-11-08 NOTE — Care Management (Signed)
Patient suffers from left femur fracture which impairs their ability to perform daily activities like bathing, dressing and toileting in the home. A cane, crutch or walker will not resolve issue with performing activities of daily living. A wheelchair will allow patient to safely perform daily activities. Patient can safely propel the wheelchair in the home or has a caregiver who can provide assistance. Length of need 6 months .  Accessories: elevating leg rests (ELRs), wheel locks, extensions and anti-tippers.

## 2018-11-08 NOTE — Discharge Instructions (Signed)
Barbara [name],  You were in the hospital because of a broken femur. This was fixed in the operating room. You will go home with home health. Please follow-up with the orthopedic surgeon.    Orthopaedic Trauma Service Discharge Instructions   General Discharge Instructions  WEIGHT BEARING STATUS: touchdown weightbearing left leg  RANGE OF MOTION/ACTIVITY: Unrestricted range of motion of the left knee  Wound Care: Incisions can be left open to air if there is no drainage. If incision continues to have drainage, follow wound care instructions below. Okay to shower if no drainage from incisions.  DVT/PE prophylaxis: Lovenox  Diet: as you were eating previously.  Can use over the counter stool softeners and bowel preparations, such as Miralax, to help with bowel movements.  Narcotics can be constipating.  Be sure to drink plenty of fluids  PAIN MEDICATION USE AND EXPECTATIONS  You have likely been given narcotic medications to help control your pain.  After a traumatic event that results in an fracture (broken bone) with or without surgery, it is ok to use narcotic pain medications to help control one's pain.  We understand that everyone responds to pain differently and each individual patient will be evaluated on a regular basis for the continued need for narcotic medications. Ideally, narcotic medication use should last no more than 6-8 weeks (coinciding with fracture healing).   As a patient it is your responsibility as well to monitor narcotic medication use and report the amount and frequency you use these medications when you come to your office visit.   We would also advise that if you are using narcotic medications, you should take a dose prior to therapy to maximize you participation.  IF YOU ARE ON NARCOTIC MEDICATIONS IT IS NOT PERMISSIBLE TO OPERATE A MOTOR VEHICLE (MOTORCYCLE/CAR/TRUCK/MOPED) OR HEAVY MACHINERY DO NOT MIX NARCOTICS WITH OTHER CNS (CENTRAL NERVOUS SYSTEM)  DEPRESSANTS SUCH AS ALCOHOL   STOP SMOKING OR USING NICOTINE PRODUCTS!!!!  As discussed nicotine severely impairs your body's ability to heal surgical and traumatic wounds but also impairs bone healing.  Wounds and bone heal by forming microscopic blood vessels (angiogenesis) and nicotine is a vasoconstrictor (essentially, shrinks blood vessels).  Therefore, if vasoconstriction occurs to these microscopic blood vessels they essentially disappear and are unable to deliver necessary nutrients to the healing tissue.  This is one modifiable factor that you can do to dramatically increase your chances of healing your injury.    (This means no smoking, no nicotine gum, patches, etc)  DO NOT USE NONSTEROIDAL ANTI-INFLAMMATORY DRUGS (NSAID'S)  Using products such as Advil (ibuprofen), Aleve (naproxen), Motrin (ibuprofen) for additional pain control during fracture healing can delay and/or prevent the healing response.  If you would like to take over the counter (OTC) medication, Tylenol (acetaminophen) is ok.  However, some narcotic medications that are given for pain control contain acetaminophen as well. Therefore, you should not exceed more than 4000 mg of tylenol in a day if you do not have liver disease.  Also note that there are may OTC medicines, such as cold medicines and allergy medicines that my contain tylenol as well.  If you have any questions about medications and/or interactions please ask your doctor/PA or your pharmacist.      ICE AND ELEVATE INJURED/OPERATIVE EXTREMITY  Using ice and elevating the injured extremity above your heart can help with swelling and pain control.  Icing in a pulsatile fashion, such as 20 minutes on and 20 minutes off, can be followed.  Do not place ice directly on skin. Make sure there is a barrier between to skin and the ice pack.    Using frozen items such as frozen peas works well as the conform nicely to the are that needs to be iced.  USE AN ACE WRAP OR TED  HOSE FOR SWELLING CONTROL  In addition to icing and elevation, Ace wraps or TED hose are used to help limit and resolve swelling.  It is recommended to use Ace wraps or TED hose until you are informed to stop.    When using Ace Wraps start the wrapping distally (farthest away from the body) and wrap proximally (closer to the body)   Example: If you had surgery on your leg or thing and you do not have a splint on, start the ace wrap at the toes and work your way up to the thigh        If you had surgery on your upper extremity and do not have a splint on, start the ace wrap at your fingers and work your way up to the upper arm  [place]: [phone]   VISIT OUR WEBSITE FOR ADDITIONAL INFORMATION: [email or web address]    Discharge Wound Care Instructions  Do NOT apply any ointments, solutions or lotions to pin sites or surgical wounds.  These prevent needed drainage and even though solutions like hydrogen peroxide kill bacteria, they also damage cells lining the pin sites that help fight infection.  Applying lotions or ointments can keep the wounds moist and can cause them to breakdown and open up as well. This can increase the risk for infection. When in doubt call the office.  Surgical incisions should be dressed daily.  If any drainage is noted, use one layer of adaptic, then gauze, Kerlix, and an ace wrap.  Once the incision is completely dry and without drainage, it may be left open to air out.  Showering may begin 36-48 hours later.  Cleaning gently with soap and water.  Traumatic wounds should be dressed daily as well.    One layer of adaptic, gauze, Kerlix, then ace wrap.  The adaptic can be discontinued once the draining has ceased    If you have a wet to dry dressing: wet the gauze with saline the squeeze as much saline out so the gauze is moist (not soaking wet), place moistened gauze over wound, then place a dry gauze over the moist one, followed  by Kerlix wrap, then ace wrap.

## 2018-11-08 NOTE — Discharge Summary (Signed)
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 detect is 250  copies / mL. A negative result does not preclude SARS-CoV-2 infection  and should not be used  as the sole basis for treatment or other  patient management decisions.  A negative result may occur with  improper specimen collection / handling, submission of specimen other  than nasopharyngeal swab, presence of viral mutation(s) within the  areas targeted by this assay, and inadequate number of viral copies  (<250 copies / mL). A negative result must be combined with clinical  observations, patient history, and epidemiological information. If result is POSITIVE SARS-CoV-2 target nucleic acids are DETECTED. The SARS-CoV-2  RNA is generally detectable in upper and lower  respiratory specimens dur ing the acute phase of infection.  Positive  results are indicative of active infection with SARS-CoV-2.  Clinical  correlation with patient history and other diagnostic information is  necessary to determine patient infection status.  Positive results do  not rule out bacterial infection or co-infection with other viruses. If result is PRESUMPTIVE POSTIVE SARS-CoV-2 nucleic acids MAY BE PRESENT.   A presumptive positive result was obtained on the submitted specimen  and confirmed on repeat testing.  While 2019 novel coronavirus  (SARS-CoV-2) nucleic acids may be present in the submitted sample  additional confirmatory testing may be necessary for epidemiological  and / or clinical management purposes  to differentiate between  SARS-CoV-2 and other Sarbecovirus currently known to infect humans.  If clinically indicated additional testing with an alternate test  methodology [phone]) is advised. The SARS-CoV-2 RNA is generally  detectable in upper and lower respiratory sp ecimens during the acute  phase of infection. The expected result is Negative. Fact Sheet for Patients:  StrictlyIdeas.no Fact Sheet for Healthcare Providers: BankingDealers.co.za This test is not yet approved or cleared by the [place] FDA and has been authorized for detection and/or diagnosis of SARS-CoV-2 by FDA under an Emergency Use Authorization (EUA).  This EUA will remain in effect (meaning this test can be used) for the duration of the COVID-19 declaration under Section 564(b)(1) of the Act, 21 U.S.C. section 360bbb-3(b)(1), unless the authorization is terminated or revoked sooner. Performed at [facility], [address] [place], [place] [address]   Surgical PCR screen     Status: None   Collection Time: 11/04/18  7:04 AM   Specimen: Nasal Mucosa; Nasal Swab  Result Value Ref  Range Status   MRSA, PCR NEGATIVE NEGATIVE Final   Staphylococcus aureus NEGATIVE NEGATIVE Final    Comment: (NOTE) The Xpert SA Assay (FDA approved for NASAL specimens in patients 13 years of age and older), is one component of a comprehensive surveillance program. It is not intended to diagnose infection nor to guide or monitor treatment. Performed at [facility], [place] [address], [place], [place] [address]     SIGNED:   Cordelia Poche, MD [facility] 11/08/2018, 8:27 AM

## 2018-11-10 DIAGNOSIS — H409 Unspecified glaucoma: Secondary | ICD-10-CM | POA: Diagnosis not present

## 2018-11-10 DIAGNOSIS — F17210 Nicotine dependence, cigarettes, uncomplicated: Secondary | ICD-10-CM | POA: Diagnosis not present

## 2018-11-10 DIAGNOSIS — I2510 Atherosclerotic heart disease of native coronary artery without angina pectoris: Secondary | ICD-10-CM | POA: Diagnosis not present

## 2018-11-10 DIAGNOSIS — I69311 Memory deficit following cerebral infarction: Secondary | ICD-10-CM | POA: Diagnosis not present

## 2018-11-10 DIAGNOSIS — K219 Gastro-esophageal reflux disease without esophagitis: Secondary | ICD-10-CM | POA: Diagnosis not present

## 2018-11-10 DIAGNOSIS — I480 Paroxysmal atrial fibrillation: Secondary | ICD-10-CM | POA: Diagnosis not present

## 2018-11-10 DIAGNOSIS — M549 Dorsalgia, unspecified: Secondary | ICD-10-CM | POA: Diagnosis not present

## 2018-11-10 DIAGNOSIS — S72422D Displaced fracture of lateral condyle of left femur, subsequent encounter for closed fracture with routine healing: Secondary | ICD-10-CM | POA: Diagnosis not present

## 2018-11-10 DIAGNOSIS — E785 Hyperlipidemia, unspecified: Secondary | ICD-10-CM | POA: Diagnosis not present

## 2018-11-10 DIAGNOSIS — G8929 Other chronic pain: Secondary | ICD-10-CM | POA: Diagnosis not present

## 2018-11-10 DIAGNOSIS — E119 Type 2 diabetes mellitus without complications: Secondary | ICD-10-CM | POA: Diagnosis not present

## 2018-11-10 DIAGNOSIS — T7611XD Adult physical abuse, suspected, subsequent encounter: Secondary | ICD-10-CM | POA: Diagnosis not present

## 2018-11-10 DIAGNOSIS — H3530 Unspecified macular degeneration: Secondary | ICD-10-CM | POA: Diagnosis not present

## 2018-11-10 DIAGNOSIS — J45909 Unspecified asthma, uncomplicated: Secondary | ICD-10-CM | POA: Diagnosis not present

## 2018-11-10 DIAGNOSIS — I10 Essential (primary) hypertension: Secondary | ICD-10-CM | POA: Diagnosis not present

## 2018-11-10 DIAGNOSIS — D62 Acute posthemorrhagic anemia: Secondary | ICD-10-CM | POA: Diagnosis not present

## 2018-11-13 DIAGNOSIS — Z8673 Personal history of transient ischemic attack (TIA), and cerebral infarction without residual deficits: Secondary | ICD-10-CM | POA: Diagnosis not present

## 2018-11-13 DIAGNOSIS — Z9851 Tubal ligation status: Secondary | ICD-10-CM | POA: Diagnosis not present

## 2018-11-13 DIAGNOSIS — I480 Paroxysmal atrial fibrillation: Secondary | ICD-10-CM | POA: Diagnosis not present

## 2018-11-13 DIAGNOSIS — I6782 Cerebral ischemia: Secondary | ICD-10-CM | POA: Diagnosis not present

## 2018-11-13 DIAGNOSIS — Z7982 Long term (current) use of aspirin: Secondary | ICD-10-CM | POA: Diagnosis not present

## 2018-11-13 DIAGNOSIS — Z794 Long term (current) use of insulin: Secondary | ICD-10-CM | POA: Diagnosis not present

## 2018-11-13 DIAGNOSIS — D649 Anemia, unspecified: Secondary | ICD-10-CM | POA: Diagnosis not present

## 2018-11-13 DIAGNOSIS — Z79899 Other long term (current) drug therapy: Secondary | ICD-10-CM | POA: Diagnosis not present

## 2018-11-13 DIAGNOSIS — Z1159 Encounter for screening for other viral diseases: Secondary | ICD-10-CM | POA: Diagnosis not present

## 2018-11-13 DIAGNOSIS — R4182 Altered mental status, unspecified: Secondary | ICD-10-CM | POA: Diagnosis not present

## 2018-11-13 DIAGNOSIS — Z880 Allergy status to penicillin: Secondary | ICD-10-CM | POA: Diagnosis not present

## 2018-11-13 DIAGNOSIS — E119 Type 2 diabetes mellitus without complications: Secondary | ICD-10-CM | POA: Diagnosis not present

## 2018-11-13 DIAGNOSIS — N3000 Acute cystitis without hematuria: Secondary | ICD-10-CM | POA: Diagnosis not present

## 2018-11-13 DIAGNOSIS — M6281 Muscle weakness (generalized): Secondary | ICD-10-CM | POA: Diagnosis not present

## 2018-11-13 DIAGNOSIS — S7292XD Unspecified fracture of left femur, subsequent encounter for closed fracture with routine healing: Secondary | ICD-10-CM | POA: Diagnosis not present

## 2018-11-13 DIAGNOSIS — F17200 Nicotine dependence, unspecified, uncomplicated: Secondary | ICD-10-CM | POA: Diagnosis not present

## 2018-11-13 DIAGNOSIS — R262 Difficulty in walking, not elsewhere classified: Secondary | ICD-10-CM | POA: Diagnosis not present

## 2018-11-13 DIAGNOSIS — R443 Hallucinations, unspecified: Secondary | ICD-10-CM | POA: Diagnosis not present

## 2018-11-13 DIAGNOSIS — N390 Urinary tract infection, site not specified: Secondary | ICD-10-CM | POA: Diagnosis not present

## 2018-11-14 ENCOUNTER — Encounter: Payer: Self-pay | Admitting: Student

## 2018-11-14 DIAGNOSIS — N390 Urinary tract infection, site not specified: Secondary | ICD-10-CM | POA: Diagnosis not present

## 2018-11-14 DIAGNOSIS — R4182 Altered mental status, unspecified: Secondary | ICD-10-CM | POA: Diagnosis not present

## 2018-11-14 DIAGNOSIS — D649 Anemia, unspecified: Secondary | ICD-10-CM | POA: Diagnosis not present

## 2018-11-15 DIAGNOSIS — D649 Anemia, unspecified: Secondary | ICD-10-CM | POA: Diagnosis not present

## 2018-11-16 DIAGNOSIS — S7292XD Unspecified fracture of left femur, subsequent encounter for closed fracture with routine healing: Secondary | ICD-10-CM | POA: Diagnosis not present

## 2018-11-16 DIAGNOSIS — D649 Anemia, unspecified: Secondary | ICD-10-CM | POA: Diagnosis not present

## 2018-11-16 DIAGNOSIS — R4182 Altered mental status, unspecified: Secondary | ICD-10-CM | POA: Diagnosis not present

## 2018-11-16 DIAGNOSIS — N390 Urinary tract infection, site not specified: Secondary | ICD-10-CM | POA: Diagnosis not present

## 2018-11-17 DIAGNOSIS — E119 Type 2 diabetes mellitus without complications: Secondary | ICD-10-CM | POA: Diagnosis not present

## 2018-11-17 DIAGNOSIS — D649 Anemia, unspecified: Secondary | ICD-10-CM | POA: Diagnosis not present

## 2018-11-17 DIAGNOSIS — S7292XD Unspecified fracture of left femur, subsequent encounter for closed fracture with routine healing: Secondary | ICD-10-CM | POA: Diagnosis not present

## 2018-11-17 DIAGNOSIS — Z87440 Personal history of urinary (tract) infections: Secondary | ICD-10-CM | POA: Diagnosis not present

## 2018-11-17 DIAGNOSIS — Z794 Long term (current) use of insulin: Secondary | ICD-10-CM | POA: Diagnosis not present

## 2018-11-17 DIAGNOSIS — I480 Paroxysmal atrial fibrillation: Secondary | ICD-10-CM | POA: Diagnosis not present

## 2018-11-23 DIAGNOSIS — S72422D Displaced fracture of lateral condyle of left femur, subsequent encounter for closed fracture with routine healing: Secondary | ICD-10-CM | POA: Diagnosis not present

## 2018-11-23 DIAGNOSIS — I480 Paroxysmal atrial fibrillation: Secondary | ICD-10-CM | POA: Diagnosis not present

## 2018-11-23 DIAGNOSIS — E119 Type 2 diabetes mellitus without complications: Secondary | ICD-10-CM | POA: Diagnosis not present

## 2018-11-24 DIAGNOSIS — Z79899 Other long term (current) drug therapy: Secondary | ICD-10-CM | POA: Diagnosis not present

## 2018-11-24 DIAGNOSIS — S72402A Unspecified fracture of lower end of left femur, initial encounter for closed fracture: Secondary | ICD-10-CM | POA: Diagnosis not present

## 2018-11-24 DIAGNOSIS — M545 Low back pain: Secondary | ICD-10-CM | POA: Diagnosis not present

## 2018-11-24 DIAGNOSIS — I509 Heart failure, unspecified: Secondary | ICD-10-CM | POA: Diagnosis not present

## 2018-11-24 DIAGNOSIS — M79605 Pain in left leg: Secondary | ICD-10-CM | POA: Diagnosis not present

## 2018-11-24 DIAGNOSIS — I4891 Unspecified atrial fibrillation: Secondary | ICD-10-CM | POA: Diagnosis not present

## 2018-11-24 DIAGNOSIS — S72402D Unspecified fracture of lower end of left femur, subsequent encounter for closed fracture with routine healing: Secondary | ICD-10-CM | POA: Diagnosis not present

## 2018-11-24 DIAGNOSIS — Z794 Long term (current) use of insulin: Secondary | ICD-10-CM | POA: Diagnosis not present

## 2018-11-24 DIAGNOSIS — Z4802 Encounter for removal of sutures: Secondary | ICD-10-CM | POA: Diagnosis not present

## 2018-11-24 DIAGNOSIS — R2242 Localized swelling, mass and lump, left lower limb: Secondary | ICD-10-CM | POA: Diagnosis not present

## 2018-11-24 DIAGNOSIS — Z87891 Personal history of nicotine dependence: Secondary | ICD-10-CM | POA: Diagnosis not present

## 2018-11-28 ENCOUNTER — Encounter (INDEPENDENT_AMBULATORY_CARE_PROVIDER_SITE_OTHER): Payer: Self-pay | Admitting: *Deleted

## 2018-12-08 DIAGNOSIS — S7292XA Unspecified fracture of left femur, initial encounter for closed fracture: Secondary | ICD-10-CM | POA: Diagnosis not present

## 2018-12-21 DIAGNOSIS — R3915 Urgency of urination: Secondary | ICD-10-CM | POA: Diagnosis not present

## 2018-12-25 DIAGNOSIS — Z794 Long term (current) use of insulin: Secondary | ICD-10-CM | POA: Diagnosis not present

## 2018-12-25 DIAGNOSIS — Z87440 Personal history of urinary (tract) infections: Secondary | ICD-10-CM | POA: Diagnosis not present

## 2018-12-25 DIAGNOSIS — E119 Type 2 diabetes mellitus without complications: Secondary | ICD-10-CM | POA: Diagnosis not present

## 2018-12-25 DIAGNOSIS — D649 Anemia, unspecified: Secondary | ICD-10-CM | POA: Diagnosis not present

## 2018-12-25 DIAGNOSIS — I480 Paroxysmal atrial fibrillation: Secondary | ICD-10-CM | POA: Diagnosis not present

## 2018-12-25 DIAGNOSIS — S7292XD Unspecified fracture of left femur, subsequent encounter for closed fracture with routine healing: Secondary | ICD-10-CM | POA: Diagnosis not present

## 2018-12-25 DIAGNOSIS — E785 Hyperlipidemia, unspecified: Secondary | ICD-10-CM | POA: Diagnosis not present

## 2018-12-26 DIAGNOSIS — S72462D Displaced supracondylar fracture with intracondylar extension of lower end of left femur, subsequent encounter for closed fracture with routine healing: Secondary | ICD-10-CM | POA: Diagnosis not present

## 2018-12-28 DIAGNOSIS — I10 Essential (primary) hypertension: Secondary | ICD-10-CM | POA: Diagnosis not present

## 2018-12-28 DIAGNOSIS — G459 Transient cerebral ischemic attack, unspecified: Secondary | ICD-10-CM | POA: Diagnosis not present

## 2018-12-28 DIAGNOSIS — Z79899 Other long term (current) drug therapy: Secondary | ICD-10-CM | POA: Diagnosis not present

## 2018-12-28 DIAGNOSIS — E785 Hyperlipidemia, unspecified: Secondary | ICD-10-CM | POA: Diagnosis not present

## 2018-12-28 DIAGNOSIS — H353231 Exudative age-related macular degeneration, bilateral, with active choroidal neovascularization: Secondary | ICD-10-CM | POA: Diagnosis not present

## 2019-01-01 DIAGNOSIS — E1140 Type 2 diabetes mellitus with diabetic neuropathy, unspecified: Secondary | ICD-10-CM | POA: Diagnosis not present

## 2019-01-01 DIAGNOSIS — D649 Anemia, unspecified: Secondary | ICD-10-CM | POA: Diagnosis not present

## 2019-01-01 DIAGNOSIS — I480 Paroxysmal atrial fibrillation: Secondary | ICD-10-CM | POA: Diagnosis not present

## 2019-01-01 DIAGNOSIS — N3281 Overactive bladder: Secondary | ICD-10-CM | POA: Diagnosis not present

## 2019-01-08 DIAGNOSIS — S7292XA Unspecified fracture of left femur, initial encounter for closed fracture: Secondary | ICD-10-CM | POA: Diagnosis not present

## 2019-01-30 DIAGNOSIS — E1139 Type 2 diabetes mellitus with other diabetic ophthalmic complication: Secondary | ICD-10-CM | POA: Diagnosis not present

## 2019-02-05 ENCOUNTER — Other Ambulatory Visit (HOSPITAL_COMMUNITY): Payer: Self-pay | Admitting: Internal Medicine

## 2019-02-05 DIAGNOSIS — E1139 Type 2 diabetes mellitus with other diabetic ophthalmic complication: Secondary | ICD-10-CM | POA: Diagnosis not present

## 2019-02-05 DIAGNOSIS — I480 Paroxysmal atrial fibrillation: Secondary | ICD-10-CM | POA: Diagnosis not present

## 2019-02-05 DIAGNOSIS — E785 Hyperlipidemia, unspecified: Secondary | ICD-10-CM | POA: Diagnosis not present

## 2019-02-05 DIAGNOSIS — Z780 Asymptomatic menopausal state: Secondary | ICD-10-CM

## 2019-02-05 DIAGNOSIS — S7292XA Unspecified fracture of left femur, initial encounter for closed fracture: Secondary | ICD-10-CM | POA: Diagnosis not present

## 2019-02-13 DIAGNOSIS — S72462D Displaced supracondylar fracture with intracondylar extension of lower end of left femur, subsequent encounter for closed fracture with routine healing: Secondary | ICD-10-CM | POA: Diagnosis not present

## 2019-02-19 ENCOUNTER — Ambulatory Visit (HOSPITAL_COMMUNITY)
Admission: RE | Admit: 2019-02-19 | Discharge: 2019-02-19 | Disposition: A | Discharge status: Home / Self Care | Payer: Medicare Other | Source: Ambulatory Visit | Attending: Internal Medicine | Admitting: Internal Medicine

## 2019-02-19 ENCOUNTER — Other Ambulatory Visit: Payer: Self-pay

## 2019-02-19 DIAGNOSIS — Z780 Asymptomatic menopausal state: Secondary | ICD-10-CM | POA: Diagnosis not present

## 2019-02-19 DIAGNOSIS — M8588 Other specified disorders of bone density and structure, other site: Secondary | ICD-10-CM | POA: Diagnosis not present

## 2019-02-19 DIAGNOSIS — M810 Age-related osteoporosis without current pathological fracture: Secondary | ICD-10-CM | POA: Diagnosis not present

## 2019-02-22 DIAGNOSIS — H353231 Exudative age-related macular degeneration, bilateral, with active choroidal neovascularization: Secondary | ICD-10-CM | POA: Diagnosis not present

## 2019-03-02 DIAGNOSIS — L309 Dermatitis, unspecified: Secondary | ICD-10-CM | POA: Diagnosis not present

## 2019-03-02 DIAGNOSIS — I87332 Chronic venous hypertension (idiopathic) with ulcer and inflammation of left lower extremity: Secondary | ICD-10-CM | POA: Diagnosis not present

## 2019-03-02 DIAGNOSIS — K5900 Constipation, unspecified: Secondary | ICD-10-CM | POA: Diagnosis not present

## 2019-03-07 ENCOUNTER — Other Ambulatory Visit: Payer: Self-pay

## 2019-03-07 DIAGNOSIS — I739 Peripheral vascular disease, unspecified: Secondary | ICD-10-CM

## 2019-03-08 ENCOUNTER — Other Ambulatory Visit: Payer: Self-pay | Admitting: Internal Medicine

## 2019-03-09 ENCOUNTER — Ambulatory Visit: Payer: Medicare Other | Admitting: Physician Assistant

## 2019-03-09 ENCOUNTER — Other Ambulatory Visit: Payer: Self-pay

## 2019-03-09 ENCOUNTER — Ambulatory Visit (HOSPITAL_COMMUNITY)
Admission: RE | Admit: 2019-03-09 | Discharge: 2019-03-09 | Disposition: A | Discharge status: Home / Self Care | Payer: Medicare Other | Source: Ambulatory Visit | Attending: Family | Admitting: Family

## 2019-03-09 VITALS — BP 130/57 | HR 59 | Temp 97.1°F | Resp 14 | Ht 62.0 in | Wt 124.0 lb

## 2019-03-09 DIAGNOSIS — I739 Peripheral vascular disease, unspecified: Secondary | ICD-10-CM

## 2019-03-09 NOTE — Progress Notes (Signed)
[facility] [place] HISTORY AND PHYSICAL   HPI: Barbara [name] is a 78 y.o. female, who is here today for evaluation of lower extremity arterial insufficiency.   She denies any rest pain in her feet and no calf claudication symptoms.  She is here today for follow up and repeat ABI's.      On 11/04/2018 she had a fall and fractured her distal femur.  Dr. Doreatha Martin performed ORIF.      Past Medical History:  Diagnosis Date  . Arteriosclerotic cardiovascular disease (ASCVD)    non-obstructive; negative stress nuclear and normal echo in 08/[facility] in 03/2000-20%  main 20% LAD, 30% circumflex, 30% RCA, hyperdynamic LV  . Asthma    Mild  . Chest pain    none recent  . Chronic back pain   . Diabetes mellitus    type 2  . GERD (gastroesophageal reflux disease)   . Glaucoma    both eyes  . Hyperlipidemia    total cholesterol of 271 and LDL of 187 in 07/2010  . Hypertension   . Macular degeneration   . PAF (paroxysmal atrial fibrillation) ([place])   . Palpitations   . Stroke [facility]) 1993 2008   short term memory loss  . Tobacco abuse     Past Surgical History:  Procedure Laterality Date  . APPENDECTOMY    . CATARACT EXTRACTION     left  . COLECTOMY WITH COLOSTOMY CREATION/HARTMANN PROCEDURE Left 09/29/2017   Procedure: EXTENDED LEFT HEMI COLECTOMY WITH COLOSTOMY CREATION (HARTMANN PROCEDURE);  Surgeon: Greer Pickerel, MD;  Location: [place];  Service: General;  Laterality: Left;  . COLONOSCOPY  2009  . COLONOSCOPY  03/30/2012   Procedure: COLONOSCOPY;  Surgeon: Rogene Houston, MD;  [facility];  Service: Endoscopy;  Laterality: N/A;  730  . COLONOSCOPY N/A 12/13/2013   Procedure: COLONOSCOPY;  Surgeon: Rogene Houston, MD;  [facility];  Service: Endoscopy;  Laterality: N/A;  1200  . COLONOSCOPY N/A 05/25/2018   Procedure: COLONOSCOPY THROUGH OSTOMY;  Surgeon: Leighton Ruff, MD;  [facility];  Service:  Endoscopy;  Laterality: N/A;  . COLOSTOMY TAKEDOWN N/A 05/26/2018   Procedure: LAPAROSCOPIC COLOSTOMY REVERSAL ERAS PATHWAY;  Surgeon: Leighton Ruff, MD;  [facility];  Service: General;  Laterality: N/A;  . ESOPHAGOGASTRODUODENOSCOPY  10/21/2011   Procedure: ESOPHAGOGASTRODUODENOSCOPY (EGD);  Surgeon: Rogene Houston, MD;  [facility];  Service: Endoscopy;  Laterality: N/A;  1200  . ESOPHAGOGASTRODUODENOSCOPY N/A 12/13/2013   Procedure: ESOPHAGOGASTRODUODENOSCOPY (EGD);  Surgeon: Rogene Houston, MD;  [facility];  Service: Endoscopy;  Laterality: N/A;  . LAPAROTOMY N/A 09/29/2017   Procedure: EXPLORATORY LAPAROTOMY;  Surgeon: Greer Pickerel, MD;  Location: [facility];  Service: General;  Laterality: N/A;  . ORIF FEMUR FRACTURE Left 11/04/2018   Procedure: OPEN REDUCTION INTERNAL FIXATION (ORIF) DISTAL FEMUR FRACTURE;  Surgeon: Shona Needles, MD;  Location: [place] [facility];  Service: Orthopedics;  Laterality: Left;  . POLYPECTOMY  05/25/2018   Procedure: POLYPECTOMY;  Surgeon: Leighton Ruff, MD;  [facility];  Service: Endoscopy;;  . TUBAL LIGATION      ROS:   General:  No weight loss, Fever, chills  HEENT: No recent headaches, no nasal bleeding, no visual changes, no sore throat  Neurologic: No dizziness, blackouts, seizures. No recent symptoms of stroke or mini- stroke. No recent episodes of slurred speech, or temporary blindness.  Cardiac: No recent episodes of chest pain/pressure,  no shortness of breath at rest.  No shortness of breath with exertion.  Denies history of atrial fibrillation or irregular heartbeat  Vascular: No history of rest pain in feet.  No history of claudication.  No history of non-healing ulcer, No history of DVT   Pulmonary: No home oxygen, no productive cough, no hemoptysis,  No asthma or wheezing  Musculoskeletal:  [ ]  Arthritis, [ ]  Low back pain,  [ ]  Joint pain  Hematologic:No history of hypercoagulable state.  No history of easy  bleeding.  No history of anemia  Gastrointestinal: No hematochezia or melena,  No gastroesophageal reflux, no trouble swallowing  Urinary: [ ]  chronic Kidney disease, [ ]  on HD - [ ]  MWF or [ ]  TTHS, [ ]  Burning with urination, [ ]  Frequent urination, [ ]  Difficulty urinating;   Skin: No rashes  Psychological: No history of anxiety,  No history of depression  Social History Social History   Tobacco Use  . Smoking status: Current Every Day Smoker    Packs/day: 0.50    Years: 50.00    Pack years: 25.00    Types: Cigarettes    Last attempt to quit: 10/06/2017    Years since quitting: 1.4  . Smokeless tobacco: Never Used  Substance Use Topics  . Alcohol use: No  . Drug use: No    Family History Family History  Problem Relation Age of Onset  . Heart attack Mother   . Cancer Father   . Cancer Sister   . Heart attack Brother   . Cancer Brother   . Cancer Sister   . Cancer Sister     Allergies  Allergies  Allergen Reactions  . Bee Venom Anaphylaxis  . Neomycin Other (See Comments)    Patient states her legs swell up and has a burn look to them. It also causes itching and burning   . Latex   . Penicillins Itching, Rash and Other (See Comments)    Has patient had a PCN reaction causing immediate rash, facial/tongue/throat swelling, SOB or lightheadedness with hypotension: no Has patient had a PCN reaction causing severe rash involving mucus membranes or skin necrosis: yes a rash Has patient had a PCN reaction that required hospitalization no Has patient had a PCN reaction occurring within the last 10 years: no If all of the above answers are "NO", then may proceed with Cephalo  . Sulfamethoxazole Nausea And Vomiting  . Sulfonamide Derivatives Nausea And Vomiting     Current Outpatient Medications  Medication Sig Dispense Refill  . Ascorbic Acid (VITAMIN C) 1000 MG tablet Take 1,000 mg by mouth daily.     Marland Kitchen aspirin 325 MG tablet Take 0.5 tablets (162.5 mg total) by  mouth at bedtime. Restart after completing Lovenox    . Biotin 1000 MCG tablet Take 1,000 mcg by mouth daily.     . brimonidine (ALPHAGAN) 0.2 % ophthalmic solution Place 1 drop into both eyes 3 (three) times daily.   1  . Cholecalciferol (VITAMIN D) 1000 UNITS capsule Take 1,000 Units by mouth every morning.     . Cyanocobalamin (VITAMIN B-12) 2500 MCG SUBL Place 2,500 mcg under the tongue daily.    Marland Kitchen enoxaparin (LOVENOX) 40 MG/0.4ML injection Inject 0.4 mLs (40 mg total) into the skin daily. 30 Syringe 0  . flecainide (TAMBOCOR) 100 MG tablet Take 1 tablet by mouth twice daily (Patient taking differently: Take 100 mg by mouth 2 (two) times daily. ) 180 tablet 1  . Insulin  Glargine (LANTUS) 100 UNIT/ML Solostar Pen Inject 14 Units into the skin daily at 10 pm. (Patient taking differently: Inject 9 Units into the skin daily. ) 15 mL 11  . insulin regular (NOVOLIN R,HUMULIN R) 100 units/mL injection Inject 6 Units into the skin 3 (three) times daily before meals. As directed per sliding scale    . methocarbamol (ROBAXIN) 500 MG tablet Take 1 tablet (500 mg total) by mouth every 8 (eight) hours as needed for muscle spasms. (Patient taking differently: Take 500 mg by mouth at bedtime. ) 60 tablet 0  . metoprolol tartrate (LOPRESSOR) 25 MG tablet Take 0.5 tablets (12.5 mg total) by mouth 2 (two) times daily. 60 tablet 0  . Multiple Vitamins-Minerals (PRESERVISION AREDS 2) CAPS Take 1 capsule by mouth 2 (two) times daily.    . nitroGLYCERIN (NITROSTAT) 0.4 MG SL tablet Place 1 tablet (0.4 mg total) under the tongue every 5 (five) minutes x 3 doses as needed for chest pain. 30 tablet 0  . Polyethyl Glycol-Propyl Glycol (SYSTANE) 0.4-0.3 % SOLN Place 1 drop into both eyes 2 (two) times daily.     . polyethylene glycol (MIRALAX / GLYCOLAX) packet Take 17 g by mouth daily. 14 each 0  . senna-docusate (SENOKOT-S) 8.6-50 MG tablet Take 1 tablet by mouth daily.    . traMADol (ULTRAM) 50 MG tablet Take 1-2  tablets (50-100 mg total) by mouth every 6 (six) hours as needed for moderate pain or severe pain. 28 tablet 0   No current facility-administered medications for this visit.     Physical Examination  Vitals:   03/09/19 0944  BP: (!) 130/57  Pulse: (!) 59  Resp: 14  Temp: (!) 97.1 F (36.2 C)  TempSrc: Temporal  SpO2: 99%  Weight: 124 lb (56.2 kg)  Height: 5\' 2"  (1.575 m)    Body mass index is 22.68 kg/m.  General:  Alert and oriented, no acute distress HEENT: Normal Neck: No bruit or JVD Pulmonary: Clear to auscultation right , wheezing Left Cardiac: Regular Rate and Rhythm without murmur Abdomen: Soft, non-tender, non-distended, no mass, no scars Skin: No rash Extremity Pulses:   radial, brachial, femoral, right dorsalis pedis, B posterior tibial pulses. Musculoskeletal: No deformity or edema, well healed scars left thigh/hip  Neurologic: Upper and lower extremity motor 5/5 and symmetric  DATA:    ABI Findings: +---------+------------------+-----+--------+---------------+ Right    Rt Pressure (mmHg)IndexWaveformComment         +---------+------------------+-----+--------+---------------+ Brachial 141                                            +---------+------------------+-----+--------+---------------+ PTA      79                0.56 biphasic                +---------+------------------+-----+--------+---------------+ DP       68                0.48 biphasic                +---------+------------------+-----+--------+---------------+ Great Toe                       Absent  Not discernable +---------+------------------+-----+--------+---------------+  +---------+------------------+-----+----------+---------------+ Left     Lt Pressure (mmHg)IndexWaveform  Comment         +---------+------------------+-----+----------+---------------+ Brachial  136                                               +---------+------------------+-----+----------+---------------+ PTA      63                0.45 biphasic                  +---------+------------------+-----+----------+---------------+ DP       62                0.44 monophasic                +---------+------------------+-----+----------+---------------+ Great Toe                       Absent    not discernable +---------+------------------+-----+----------+---------------+     Right ABIs appear increased compared to prior study on 10/07/2017. Left ABIs appear essentially unchanged.   Summary: Right: Resting right ankle-brachial index indicates moderate right lower extremity arterial disease.  Left: Resting left ankle-brachial index indicates severe left lower extremity arterial disease.   ASSESSMENT:  Asymptomatic PAD B LE  PLAN:  She will continue acitvies as tolerates.  I she develops symptoms of claudication, non healing wound or rest pain she will call.  F/U for repeat ABI's in 1 year.  Roxy Horseman PA-C [facility] [place] Office: [phone]  MD in [place]

## 2019-03-10 DIAGNOSIS — S7292XA Unspecified fracture of left femur, initial encounter for closed fracture: Secondary | ICD-10-CM | POA: Diagnosis not present

## 2019-03-14 ENCOUNTER — Other Ambulatory Visit: Payer: Self-pay | Admitting: Internal Medicine

## 2019-03-14 NOTE — Discharge Instructions (Signed)
Denosumab injection °What is this medicine? °DENOSUMAB (den oh sue mab) slows bone breakdown. Prolia is used to treat osteoporosis in women after menopause and in men, and in people who are taking corticosteroids for 6 months or more. Xgeva is used to treat a high calcium level due to cancer and to prevent bone fractures and other bone problems caused by multiple myeloma or cancer bone metastases. Xgeva is also used to treat giant cell tumor of the bone. °This medicine may be used for other purposes; ask your health care provider or pharmacist if you have questions. °COMMON BRAND NAME(S): Prolia, XGEVA °What should I tell my health care provider before I take this medicine? °They need to know if you have any of these conditions: °· dental disease °· having surgery or tooth extraction °· infection °· kidney disease °· low levels of calcium or Vitamin D in the blood °· malnutrition °· on hemodialysis °· skin conditions or sensitivity °· thyroid or parathyroid disease °· an unusual reaction to denosumab, other medicines, foods, dyes, or preservatives °· pregnant or trying to get pregnant °· breast-feeding °How should I use this medicine? °This medicine is for injection under the skin. It is given by a health care professional in a hospital or clinic setting. °A special MedGuide will be given to you before each treatment. Be sure to read this information carefully each time. °For Prolia, talk to your pediatrician regarding the use of this medicine in children. Special care may be needed. For Xgeva, talk to your pediatrician regarding the use of this medicine in children. While this drug may be prescribed for children as young as 13 years for selected conditions, precautions do apply. °Overdosage: If you think you have taken too much of this medicine contact a poison control center or emergency room at once. °NOTE: This medicine is only for you. Do not share this medicine with others. °What if I miss a dose? °It is  important not to miss your dose. Call your doctor or health care professional if you are unable to keep an appointment. °What may interact with this medicine? °Do not take this medicine with any of the following medications: °· other medicines containing denosumab °This medicine may also interact with the following medications: °· medicines that lower your chance of fighting infection °· steroid medicines like prednisone or cortisone °This list may not describe all possible interactions. Give your health care provider a list of all the medicines, herbs, non-prescription drugs, or dietary supplements you use. Also tell them if you smoke, drink alcohol, or use illegal drugs. Some items may interact with your medicine. °What should I watch for while using this medicine? °Visit your doctor or health care professional for regular checks on your progress. Your doctor or health care professional may order blood tests and other tests to see how you are doing. °Call your doctor or health care professional for advice if you get a fever, chills or sore throat, or other symptoms of a cold or flu. Do not treat yourself. This drug may decrease your body's ability to fight infection. Try to avoid being around people who are sick. °You should make sure you get enough calcium and vitamin D while you are taking this medicine, unless your doctor tells you not to. Discuss the foods you eat and the vitamins you take with your health care professional. °See your dentist regularly. Brush and floss your teeth as directed. Before you have any dental work done, tell your dentist you are   receiving this medicine. Do not become pregnant while taking this medicine or for 5 months after stopping it. Talk with your doctor or health care professional about your birth control options while taking this medicine. Women should inform their doctor if they wish to become pregnant or think they might be pregnant. There is a potential for serious side  effects to an unborn child. Talk to your health care professional or pharmacist for more information. What side effects may I notice from receiving this medicine? Side effects that you should report to your doctor or health care professional as soon as possible:  allergic reactions like skin rash, itching or hives, swelling of the face, lips, or tongue  bone pain  breathing problems  dizziness  jaw pain, especially after dental work  redness, blistering, peeling of the skin  signs and symptoms of infection like fever or chills; cough; sore throat; pain or trouble passing urine  signs of low calcium like fast heartbeat, muscle cramps or muscle pain; pain, tingling, numbness in the hands or feet; seizures  unusual bleeding or bruising  unusually weak or tired Side effects that usually do not require medical attention (report to your doctor or health care professional if they continue or are bothersome):  constipation  diarrhea  headache  joint pain  loss of appetite  muscle pain  runny nose  tiredness  upset stomach This list may not describe all possible side effects. Call your doctor for medical advice about side effects. You may report side effects to FDA at 1-800-FDA-1088. Where should I keep my medicine? This medicine is only given in a clinic, doctor's office, or other health care setting and will not be stored at home. NOTE: This sheet is a summary. It may not cover all possible information. If you have questions about this medicine, talk to your doctor, pharmacist, or health care provider.  2020 Elsevier/Gold Standard (2017-09-16 16:10:44)

## 2019-03-19 ENCOUNTER — Encounter (HOSPITAL_COMMUNITY): Payer: Self-pay

## 2019-03-19 ENCOUNTER — Encounter (HOSPITAL_COMMUNITY)
Admission: RE | Admit: 2019-03-19 | Discharge: 2019-03-19 | Disposition: A | Discharge status: Home / Self Care | Payer: Medicare Other | Source: Ambulatory Visit | Attending: Internal Medicine | Admitting: Internal Medicine

## 2019-03-19 NOTE — Progress Notes (Signed)
Pt called and talked to Day Surgery secretary. She is unable to come for appt today. She stated she broke her leg and is unable to walk. I attempted to call pt and reschedule her appt. No answer. Message left on answer machine to return call.

## 2019-03-20 DIAGNOSIS — S72462D Displaced supracondylar fracture with intracondylar extension of lower end of left femur, subsequent encounter for closed fracture with routine healing: Secondary | ICD-10-CM | POA: Diagnosis not present

## 2019-03-30 DIAGNOSIS — E1139 Type 2 diabetes mellitus with other diabetic ophthalmic complication: Secondary | ICD-10-CM | POA: Diagnosis not present

## 2019-04-03 ENCOUNTER — Encounter (HOSPITAL_COMMUNITY): Payer: Medicare Other

## 2019-04-03 ENCOUNTER — Ambulatory Visit: Payer: Medicare Other | Admitting: Vascular Surgery

## 2019-04-03 DIAGNOSIS — S72462K Displaced supracondylar fracture with intracondylar extension of lower end of left femur, subsequent encounter for closed fracture with nonunion: Secondary | ICD-10-CM | POA: Diagnosis not present

## 2019-04-04 DIAGNOSIS — I480 Paroxysmal atrial fibrillation: Secondary | ICD-10-CM | POA: Diagnosis not present

## 2019-04-04 DIAGNOSIS — E1139 Type 2 diabetes mellitus with other diabetic ophthalmic complication: Secondary | ICD-10-CM | POA: Diagnosis not present

## 2019-04-10 DIAGNOSIS — S7292XA Unspecified fracture of left femur, initial encounter for closed fracture: Secondary | ICD-10-CM | POA: Diagnosis not present

## 2019-04-12 DIAGNOSIS — H353231 Exudative age-related macular degeneration, bilateral, with active choroidal neovascularization: Secondary | ICD-10-CM | POA: Diagnosis not present

## 2019-04-30 ENCOUNTER — Telehealth: Payer: Self-pay | Admitting: Internal Medicine

## 2019-04-30 MED ORDER — FLECAINIDE ACETATE 100 MG PO TABS: 100.0000 mg | ORAL_TABLET | Freq: Two times a day (BID) | ORAL | 0 refills | Status: DC

## 2019-04-30 NOTE — Telephone Encounter (Signed)
New message   *STAT* If patient is at the pharmacy, call can be transferred to refill team.   1. Which medications need to be refilled? (please list name of each medication and dose if known) flecainide (TAMBOCOR) 100 MG tablet  2. Which pharmacy/location (including street and city if local pharmacy) is medication to be sent to? [place], [place] [id number] [place] [address]  3. Do they need a 30 day or 90 day supply? 90 day

## 2019-04-30 NOTE — Telephone Encounter (Signed)
Pt's medication was sent to pt's pharmacy as requested. Confirmation received.  °

## 2019-05-03 NOTE — Progress Notes (Signed)
PCP:  Asencion Noble, MD Primary Cardiologist: Virl Axe, MD Electrophysiologist: Dr. Daphene Calamity CABRINA DAHLGREN is a 78 y.o. female with past medical history of paroxysmal atrial fibrillation, nonsustained atrial tachycardia, h/o GI bleeding, HTN, and carotid stenosis who presents today for routine electrophysiology followup. They are seen for Dr. Caryl Comes.   Since last being seen in our clinic, the patient reports doing well overall. Unfortunately she lost her husband to a sudden heart attack in July.  He was cared for by EMS at the house, and it was a very traumatic experience.  She has had sleep disturbance and daytime fatigue since this. She denies symptoms of palpitations, chest pain, shortness of breath, orthopnea, PND, lower extremity edema, claudication, dizziness, presyncope, syncope, bleeding, or neurologic sequela. The patient is tolerating medications without difficulties.    The patient feels that she is tolerating medications without difficulties and is otherwise without complaint today.    Past Medical History:  Diagnosis Date  . Arteriosclerotic cardiovascular disease (ASCVD)    non-obstructive; negative stress nuclear and normal echo in 08/[facility] in 03/2000-20%  main 20% LAD, 30% circumflex, 30% RCA, hyperdynamic LV  . Asthma    Mild  . Chest pain    none recent  . Chronic back pain   . Diabetes mellitus    type 2  . GERD (gastroesophageal reflux disease)   . Glaucoma    both eyes  . Hyperlipidemia    total cholesterol of 271 and LDL of 187 in 07/2010  . Hypertension   . Macular degeneration   . PAF (paroxysmal atrial fibrillation) ([place])   . Palpitations   . Stroke [facility]) 1993 2008   short term memory loss  . Tobacco abuse    Past Surgical History:  Procedure Laterality Date  . APPENDECTOMY    . CATARACT EXTRACTION     left  . COLECTOMY WITH COLOSTOMY CREATION/HARTMANN PROCEDURE Left 09/29/2017   Procedure: EXTENDED LEFT HEMI  COLECTOMY WITH COLOSTOMY CREATION (HARTMANN PROCEDURE);  Surgeon: Greer Pickerel, MD;  Location: [place];  Service: General;  Laterality: Left;  . COLONOSCOPY  2009  . COLONOSCOPY  03/30/2012   Procedure: COLONOSCOPY;  Surgeon: Rogene Houston, MD;  [facility];  Service: Endoscopy;  Laterality: N/A;  730  . COLONOSCOPY N/A 12/13/2013   Procedure: COLONOSCOPY;  Surgeon: Rogene Houston, MD;  [facility];  Service: Endoscopy;  Laterality: N/A;  1200  . COLONOSCOPY N/A 05/25/2018   Procedure: COLONOSCOPY THROUGH OSTOMY;  Surgeon: Leighton Ruff, MD;  Location: [facility];  Service: Endoscopy;  Laterality: N/A;  . COLOSTOMY TAKEDOWN N/A 05/26/2018   Procedure: LAPAROSCOPIC COLOSTOMY REVERSAL ERAS PATHWAY;  Surgeon: Leighton Ruff, MD;  Location: [facility];  Service: General;  Laterality: N/A;  . ESOPHAGOGASTRODUODENOSCOPY  10/21/2011   Procedure: ESOPHAGOGASTRODUODENOSCOPY (EGD);  Surgeon: Rogene Houston, MD;  [facility];  Service: Endoscopy;  Laterality: N/A;  1200  . ESOPHAGOGASTRODUODENOSCOPY N/A 12/13/2013   Procedure: ESOPHAGOGASTRODUODENOSCOPY (EGD);  Surgeon: Rogene Houston, MD;  [facility];  Service: Endoscopy;  Laterality: N/A;  . LAPAROTOMY N/A 09/29/2017   Procedure: EXPLORATORY LAPAROTOMY;  Surgeon: Greer Pickerel, MD;  Location: [place];  Service: General;  Laterality: N/A;  . ORIF FEMUR FRACTURE Left 11/04/2018   Procedure: OPEN REDUCTION INTERNAL FIXATION (ORIF) DISTAL FEMUR FRACTURE;  Surgeon: Shona Needles, MD;  Location: [place];  Service: Orthopedics;  Laterality: Left;  . POLYPECTOMY  05/25/2018  Procedure: POLYPECTOMY;  Surgeon: Leighton Ruff, MD;  [facility];  Service: Endoscopy;;  . TUBAL LIGATION      Current Outpatient Medications  Medication Sig Dispense Refill  . Ascorbic Acid (VITAMIN C) 1000 MG tablet Take 1,000 mg by mouth daily.     Marland Kitchen aspirin 325 MG tablet Take 0.5 tablets (162.5 mg total) by mouth at bedtime. Restart  after completing Lovenox    . baclofen (LIORESAL) 10 MG tablet SMARTSIG:1-2.5 Tablet(s) By Mouth Twice Daily PRN    . Biotin 1000 MCG tablet Take 1,000 mcg by mouth daily.     . brimonidine (ALPHAGAN) 0.2 % ophthalmic solution Place 1 drop into both eyes 3 (three) times daily.   1  . Cyanocobalamin (VITAMIN B-12) 2500 MCG SUBL Place 2,500 mcg under the tongue daily.    Marland Kitchen ezetimibe (ZETIA) 10 MG tablet Take 10 mg by mouth daily.    . flecainide (TAMBOCOR) 100 MG tablet Take 1 tablet (100 mg total) by mouth 2 (two) times daily. Please keep upcoming appt for future refill. Thank you 180 tablet 0  . HM VITAMIN D3 100 MCG (4000 UT) CAPS Take 1 capsule by mouth daily.    . Insulin Glargine (LANTUS) 100 UNIT/ML Solostar Pen Inject 14 Units into the skin daily at 10 pm. 15 mL 11  . insulin regular (NOVOLIN R,HUMULIN R) 100 units/mL injection Inject 6 Units into the skin 3 (three) times daily before meals. As directed per sliding scale    . metoprolol tartrate (LOPRESSOR) 25 MG tablet Take 0.5 tablets (12.5 mg total) by mouth 2 (two) times daily. 60 tablet 0  . Multiple Vitamins-Minerals (PRESERVISION AREDS 2) CAPS Take 1 capsule by mouth 2 (two) times daily.    Vladimir Faster Glycol-Propyl Glycol (SYSTANE) 0.4-0.3 % SOLN Place 1 drop into both eyes 2 (two) times daily.     . rosuvastatin (CRESTOR) 10 MG tablet Take 10 mg by mouth once a week.    . senna-docusate (SENOKOT-S) 8.6-50 MG tablet Take 1 tablet by mouth daily.    . nitroGLYCERIN (NITROSTAT) 0.4 MG SL tablet Place 1 tablet (0.4 mg total) under the tongue every 5 (five) minutes x 3 doses as needed for chest pain. 25 tablet 3   No current facility-administered medications for this visit.    Allergies  Allergen Reactions  . Bee Venom Anaphylaxis  . Neomycin Other (See Comments)    Patient states her legs swell up and has a burn look to them. It also causes itching and burning   . Latex   . Penicillins Itching, Rash and Other (See Comments)     Has patient had a PCN reaction causing immediate rash, facial/tongue/throat swelling, SOB or lightheadedness with hypotension: no Has patient had a PCN reaction causing severe rash involving mucus membranes or skin necrosis: yes a rash Has patient had a PCN reaction that required hospitalization no Has patient had a PCN reaction occurring within the last 10 years: no If all of the above answers are "NO", then may proceed with Cephalo  . Sulfamethoxazole Nausea And Vomiting  . Sulfonamide Derivatives Nausea And Vomiting    Social History   Socioeconomic History  . Marital status: Married    Spouse name: Not on file  . Number of children: 1  . Years of education: Not on file  . Highest education level: Not on file  Occupational History  . Occupation: retired    Comment: hairdresser  Tobacco Use  . Smoking status: Current Every  Day Smoker    Packs/day: 0.50    Years: 50.00    Pack years: 25.00    Types: Cigarettes    Last attempt to quit: 10/06/2017    Years since quitting: 1.5  . Smokeless tobacco: Never Used  Substance and Sexual Activity  . Alcohol use: No  . Drug use: No  . Sexual activity: Not Currently  Other Topics Concern  . Not on file  Social History Narrative  . Not on file   Social Determinants of Health   Financial Resource Strain:   . Difficulty of Paying Living Expenses: Not on file  Food Insecurity:   . Worried About Charity fundraiser in the Last Year: Not on file  . Ran Out of Food in the Last Year: Not on file  Transportation Needs:   . Lack of Transportation (Medical): Not on file  . Lack of Transportation (Non-Medical): Not on file  Physical Activity:   . Days of Exercise per Week: Not on file  . Minutes of Exercise per Session: Not on file  Stress:   . Feeling of Stress : Not on file  Social Connections:   . Frequency of Communication with Friends and Family: Not on file  . Frequency of Social Gatherings with Friends and Family: Not on file    . Attends Religious Services: Not on file  . Active Member of Clubs or Organizations: Not on file  . Attends Archivist Meetings: Not on file  . Marital Status: Not on file  Intimate Partner Violence:   . Fear of Current or Ex-Partner: Not on file  . Emotionally Abused: Not on file  . Physically Abused: Not on file  . Sexually Abused: Not on file     Review of Systems: General: No chills, fever, night sweats or weight changes  Cardiovascular:  No chest pain, dyspnea on exertion, edema, orthopnea, palpitations, paroxysmal nocturnal dyspnea Dermatological: No rash, lesions or masses Respiratory: No cough, dyspnea Urologic: No hematuria, dysuria Abdominal: No nausea, vomiting, diarrhea, bright red blood per rectum, melena, or hematemesis Neurologic: No visual changes, weakness, changes in mental status All other systems reviewed and are otherwise negative except as noted above.  Physical Exam: Vitals:   05/04/19 0959  BP: 110/60  Pulse: 60  SpO2: 97%  Weight: 126 lb (57.2 kg)  Height: 5\' 2"  (1.575 m)    GEN- The patient is well appearing, alert and oriented x 3 today.   HEENT: normocephalic, atraumatic; sclera clear, conjunctiva pink; hearing intact; oropharynx clear; neck supple, no JVP Lymph- no cervical lymphadenopathy Lungs- Clear to ausculation bilaterally, normal work of breathing.  No wheezes, rales, rhonchi Heart- Regular rate and rhythm, no murmurs, rubs or gallops, PMI not laterally displaced GI- soft, non-tender, non-distended, bowel sounds present, no hepatosplenomegaly Extremities- no clubbing, cyanosis, or edema; DP/PT/radial pulses 2+ bilaterally MS- no significant deformity or atrophy Skin- warm and dry, no rash or lesion Psych- euthymic mood, full affect Neuro- strength and sensation are intact  EKG is ordered. Personal review of EKG from today shows sinus bradycardia at 59 bpm with PR interval 176 ms, QRS 84 ms  Assessment and Plan:  1.  Paroxysmal atrial fibrillation, with rapid rates Rates overall well controlled and asymptomatic.  Continue flecainide and lopressor. EKG stable.  She is not on [place] due to h/o recurrent GI bleeding on "every blood thinner I have ever tried".  2. Sinus bradycardia Stable  3. HTN Continue current regimen  4. H/o of GI  bleeding Poor OAC candidate. CHA2DS2VASC of 8    5. Daytime fatigue Very likely due to grief response. Encouraged regular follow up with PCP to follow mood.  She does not snore heavily, nor does she have a body habitus concerning for sleep apnea. Central sleep apnea is less likely, as this change has been directly related to her husbands passing.  She would not want to consider a sleep study at this time.   Shirley Friar, PA-C  05/04/19 10:34 AM

## 2019-05-04 ENCOUNTER — Other Ambulatory Visit: Payer: Self-pay

## 2019-05-04 ENCOUNTER — Ambulatory Visit: Payer: Medicare Other | Admitting: Student

## 2019-05-04 ENCOUNTER — Encounter: Payer: Self-pay | Admitting: Student

## 2019-05-04 VITALS — BP 110/60 | HR 60 | Ht 62.0 in | Wt 126.0 lb

## 2019-05-04 DIAGNOSIS — I10 Essential (primary) hypertension: Secondary | ICD-10-CM | POA: Diagnosis not present

## 2019-05-04 DIAGNOSIS — Z79899 Other long term (current) drug therapy: Secondary | ICD-10-CM | POA: Diagnosis not present

## 2019-05-04 DIAGNOSIS — I480 Paroxysmal atrial fibrillation: Secondary | ICD-10-CM | POA: Diagnosis not present

## 2019-05-04 LAB — BASIC METABOLIC PANEL
BUN/Creatinine Ratio: 16 (ref 12–28)
BUN: 16 mg/dL (ref 8–27)
CO2: 25 mmol/L (ref 20–29)
Calcium: 9.3 mg/dL (ref 8.7–10.3)
Chloride: 102 mmol/L (ref 96–106)
Creatinine, Ser: 1.03 mg/dL — ABNORMAL HIGH (ref 0.57–1.00)
GFR calc Af Amer: 61 mL/min/{1.73_m2} (ref >59)
GFR calc non Af Amer: 53 mL/min/{1.73_m2} — ABNORMAL LOW (ref >59)
Glucose: 106 mg/dL — ABNORMAL HIGH (ref 65–99)
Potassium: 4 mmol/L (ref 3.5–5.2)
Sodium: 140 mmol/L (ref 134–144)

## 2019-05-04 LAB — CBC
Hematocrit: 34.3 % (ref 34.0–46.6)
Hemoglobin: 11.6 g/dL (ref 11.1–15.9)
MCH: 30.6 pg (ref 26.6–33.0)
MCHC: 33.8 g/dL (ref 31.5–35.7)
MCV: 91 fL (ref 79–97)
Platelets: 231 10*3/uL (ref 150–450)
RBC: 3.79 x10E6/uL (ref 3.77–5.28)
RDW: 12.3 % (ref 11.7–15.4)
WBC: 8.5 10*3/uL (ref 3.4–10.8)

## 2019-05-04 MED ORDER — NITROGLYCERIN 0.4 MG SL SUBL: 0.4000 mg | SUBLINGUAL_TABLET | SUBLINGUAL | 3 refills | Status: AC | PRN

## 2019-05-04 MED ORDER — NITROGLYCERIN 0.4 MG SL SUBL: 0.4000 mg | SUBLINGUAL_TABLET | SUBLINGUAL | 3 refills | Status: DC | PRN

## 2019-05-04 NOTE — Patient Instructions (Signed)
Medication Instructions:  none *If you need a refill on your cardiac medications before your next appointment, please call your pharmacy*   Lab Work:  TODAY BMET CBC If you have labs (blood work) drawn today and your tests are completely normal, you will receive your results only by: . MyChart Message (if you have MyChart) OR . A paper copy in the mail If you have any lab test that is abnormal or we need to change your treatment, we will call you to review the results.   Testing/Procedures: none   Follow-Up: At [facility], you and your health needs are our priority.  As part of our continuing mission to provide you with exceptional heart care, we have created designated Provider Care Teams.  These Care Teams include your primary Cardiologist (physician) and Advanced Practice Providers (APPs -  Physician Assistants and Nurse Practitioners) who all work together to provide you with the care you need, when you need it.  Your next appointment:   6 month(s)  The format for your next appointment:   Either In Person or Virtual  Provider:   Dr Klein   Other Instructions   

## 2019-05-10 DIAGNOSIS — S7292XA Unspecified fracture of left femur, initial encounter for closed fracture: Secondary | ICD-10-CM | POA: Diagnosis not present

## 2019-05-29 DIAGNOSIS — S72462D Displaced supracondylar fracture with intracondylar extension of lower end of left femur, subsequent encounter for closed fracture with routine healing: Secondary | ICD-10-CM | POA: Diagnosis not present

## 2019-05-31 DIAGNOSIS — H353231 Exudative age-related macular degeneration, bilateral, with active choroidal neovascularization: Secondary | ICD-10-CM | POA: Diagnosis not present

## 2019-06-05 ENCOUNTER — Telehealth: Payer: Self-pay

## 2019-06-05 NOTE — Progress Notes (Signed)
HISTORY AND PHYSICAL     CC:  follow up. Requesting Provider:  Asencion Noble, MD  HPI: This is a 79 y.o. female who is here today for follow up.  She has been seen by Vascular Surgery in the past for evaluation of lower extremity arterial insufficiency.  She did not have any rest pain or claudication sx.    On 11/04/2018, she had fallen and had a fx of her distal femur on the left and underwent ORIF.    She does have cardiac hx of CAD that was non obstructive in 2001 by catheterization.  She is followed by [facility].  She has hx of PAF and HTN.    The pt returns today for continued complaints of left lateral thigh pain radiating to her left knee that she describes as shooting and stabbing.  Somewhat alleviated with Tylenol and or BC powder.  The patient states again that this pain occurred after using an electrical stimulator that was prescribed for her after ORIF of the left femur.  She also states she sees changes in color described as a dark purple to "black" color.  She denies numbness or inability to flex or extend her foot.  She denies lower extremity pain when walking or rest pain  The pt is on a statin for cholesterol management.    The pt is on an aspirin.    Other AC: None The pt is on BB for hypertension.  The pt does have diabetes. Tobacco hx: Continues to smoke cigarettes   Past Medical History:  Diagnosis Date  . Arteriosclerotic cardiovascular disease (ASCVD)    non-obstructive; negative stress nuclear and normal echo in 08/[facility] in 03/2000-20%  main 20% LAD, 30% circumflex, 30% RCA, hyperdynamic LV  . Asthma    Mild  . Chest pain    none recent  . Chronic back pain   . Diabetes mellitus    type 2  . GERD (gastroesophageal reflux disease)   . Glaucoma    both eyes  . Hyperlipidemia    total cholesterol of 271 and LDL of 187 in 07/2010  . Hypertension   . Macular degeneration   . PAF (paroxysmal atrial fibrillation) ([place])    . Palpitations   . Stroke [facility]) 1993 2008   short term memory loss  . Tobacco abuse     Past Surgical History:  Procedure Laterality Date  . APPENDECTOMY    . CATARACT EXTRACTION     left  . COLECTOMY WITH COLOSTOMY CREATION/HARTMANN PROCEDURE Left 09/29/2017   Procedure: EXTENDED LEFT HEMI COLECTOMY WITH COLOSTOMY CREATION (HARTMANN PROCEDURE);  Surgeon: Greer Pickerel, MD;  Location: [place];  Service: General;  Laterality: Left;  . COLONOSCOPY  2009  . COLONOSCOPY  03/30/2012   Procedure: COLONOSCOPY;  Surgeon: Rogene Houston, MD;  [facility];  Service: Endoscopy;  Laterality: N/A;  730  . COLONOSCOPY N/A 12/13/2013   Procedure: COLONOSCOPY;  Surgeon: Rogene Houston, MD;  [facility];  Service: Endoscopy;  Laterality: N/A;  1200  . COLONOSCOPY N/A 05/25/2018   Procedure: COLONOSCOPY THROUGH OSTOMY;  Surgeon: Leighton Ruff, MD;  [facility];  Service: Endoscopy;  Laterality: N/A;  . COLOSTOMY TAKEDOWN N/A 05/26/2018   Procedure: LAPAROSCOPIC COLOSTOMY REVERSAL ERAS PATHWAY;  Surgeon: Leighton Ruff, MD;  [facility];  Service: General;  Laterality: N/A;  . ESOPHAGOGASTRODUODENOSCOPY  10/21/2011   Procedure: ESOPHAGOGASTRODUODENOSCOPY (EGD);  Surgeon: Rogene Houston, MD;  [facility];  Service: Endoscopy;  Laterality: N/A;  1200  . ESOPHAGOGASTRODUODENOSCOPY N/A 12/13/2013   Procedure: ESOPHAGOGASTRODUODENOSCOPY (EGD);  Surgeon: Rogene Houston, MD;  [facility];  Service: Endoscopy;  Laterality: N/A;  . LAPAROTOMY N/A 09/29/2017   Procedure: EXPLORATORY LAPAROTOMY;  Surgeon: Greer Pickerel, MD;  Location: [place];  Service: General;  Laterality: N/A;  . ORIF FEMUR FRACTURE Left 11/04/2018   Procedure: OPEN REDUCTION INTERNAL FIXATION (ORIF) DISTAL FEMUR FRACTURE;  Surgeon: Shona Needles, MD;  Location: [place];  Service: Orthopedics;  Laterality: Left;  . POLYPECTOMY  05/25/2018   Procedure: POLYPECTOMY;  Surgeon: Leighton Ruff, MD;   [facility];  Service: Endoscopy;;  . TUBAL LIGATION      Allergies  Allergen Reactions  . Bee Venom Anaphylaxis  . Neomycin Other (See Comments)    Patient states her legs swell up and has a burn look to them. It also causes itching and burning   . Latex   . Penicillins Itching, Rash and Other (See Comments)    Has patient had a PCN reaction causing immediate rash, facial/tongue/throat swelling, SOB or lightheadedness with hypotension: no Has patient had a PCN reaction causing severe rash involving mucus membranes or skin necrosis: yes a rash Has patient had a PCN reaction that required hospitalization no Has patient had a PCN reaction occurring within the last 10 years: no If all of the above answers are "NO", then may proceed with Cephalo  . Sulfamethoxazole Nausea And Vomiting  . Sulfonamide Derivatives Nausea And Vomiting    Current Outpatient Medications  Medication Sig Dispense Refill  . Ascorbic Acid (VITAMIN C) 1000 MG tablet Take 1,000 mg by mouth daily.     Marland Kitchen aspirin 325 MG tablet Take 0.5 tablets (162.5 mg total) by mouth at bedtime. Restart after completing Lovenox    . baclofen (LIORESAL) 10 MG tablet SMARTSIG:1-2.5 Tablet(s) By Mouth Twice Daily PRN    . Biotin 1000 MCG tablet Take 1,000 mcg by mouth daily.     . brimonidine (ALPHAGAN) 0.2 % ophthalmic solution Place 1 drop into both eyes 3 (three) times daily.   1  . Cyanocobalamin (VITAMIN B-12) 2500 MCG SUBL Place 2,500 mcg under the tongue daily.    Marland Kitchen ezetimibe (ZETIA) 10 MG tablet Take 10 mg by mouth daily.    . flecainide (TAMBOCOR) 100 MG tablet Take 1 tablet (100 mg total) by mouth 2 (two) times daily. Please keep upcoming appt for future refill. Thank you 180 tablet 0  . HM VITAMIN D3 100 MCG (4000 UT) CAPS Take 1 capsule by mouth daily.    . Insulin Glargine (LANTUS) 100 UNIT/ML Solostar Pen Inject 14 Units into the skin daily at 10 pm. 15 mL 11  . insulin regular (NOVOLIN R,HUMULIN R) 100 units/mL  injection Inject 6 Units into the skin 3 (three) times daily before meals. As directed per sliding scale    . metoprolol tartrate (LOPRESSOR) 25 MG tablet Take 0.5 tablets (12.5 mg total) by mouth 2 (two) times daily. 60 tablet 0  . Multiple Vitamins-Minerals (PRESERVISION AREDS 2) CAPS Take 1 capsule by mouth 2 (two) times daily.    . nitroGLYCERIN (NITROSTAT) 0.4 MG SL tablet Place 1 tablet (0.4 mg total) under the tongue every 5 (five) minutes x 3 doses as needed for chest pain. 25 tablet 3  . Polyethyl Glycol-Propyl Glycol (SYSTANE) 0.4-0.3 % SOLN Place 1 drop into both eyes 2 (two) times daily.     . rosuvastatin (  CRESTOR) 10 MG tablet Take 10 mg by mouth once a week.    . senna-docusate (SENOKOT-S) 8.6-50 MG tablet Take 1 tablet by mouth daily.     No current facility-administered medications for this visit.    Family History  Problem Relation Age of Onset  . Heart attack Mother   . Cancer Father   . Cancer Sister   . Heart attack Brother   . Cancer Brother   . Cancer Sister   . Cancer Sister     Social History   Socioeconomic History  . Marital status: Married    Spouse name: Not on file  . Number of children: 1  . Years of education: Not on file  . Highest education level: Not on file  Occupational History  . Occupation: retired    Comment: hairdresser  Tobacco Use  . Smoking status: Current Every Day Smoker    Packs/day: 0.50    Years: 50.00    Pack years: 25.00    Types: Cigarettes    Last attempt to quit: 10/06/2017    Years since quitting: 1.6  . Smokeless tobacco: Never Used  Substance and Sexual Activity  . Alcohol use: No  . Drug use: No  . Sexual activity: Not Currently  Other Topics Concern  . Not on file  Social History Narrative  . Not on file   Social Determinants of Health   Financial Resource Strain:   . Difficulty of Paying Living Expenses: Not on file  Food Insecurity:   . Worried About Charity fundraiser in the Last Year: Not on file   . Ran Out of Food in the Last Year: Not on file  Transportation Needs:   . Lack of Transportation (Medical): Not on file  . Lack of Transportation (Non-Medical): Not on file  Physical Activity:   . Days of Exercise per Week: Not on file  . Minutes of Exercise per Session: Not on file  Stress:   . Feeling of Stress : Not on file  Social Connections:   . Frequency of Communication with Friends and Family: Not on file  . Frequency of Social Gatherings with Friends and Family: Not on file  . Attends Religious Services: Not on file  . Active Member of Clubs or Organizations: Not on file  . Attends Archivist Meetings: Not on file  . Marital Status: Not on file  Intimate Partner Violence:   . Fear of Current or Ex-Partner: Not on file  . Emotionally Abused: Not on file  . Physically Abused: Not on file  . Sexually Abused: Not on file     REVIEW OF SYSTEMS:   [X]  denotes positive finding, [ ]  denotes negative finding Cardiac  Comments:  Chest pain or chest pressure:    Shortness of breath upon exertion:    Short of breath when lying flat:    Irregular heart rhythm:        Vascular    Pain in calf, thigh, or hip brought on by ambulation:    Pain in feet at night that wakes you up from your sleep:     Blood clot in your veins:    Leg swelling:         Pulmonary    Oxygen at home:    Productive cough:     Wheezing:         Neurologic    Sudden weakness in arms or legs:     Sudden numbness in arms or  legs:     Sudden onset of difficulty speaking or slurred speech:    Temporary loss of vision in one eye:     Problems with dizziness:         Gastrointestinal    Blood in stool:     Vomited blood:         Genitourinary    Burning when urinating:     Blood in urine:        Psychiatric    Major depression:         Hematologic    Bleeding problems:    Problems with blood clotting too easily:        Skin    Rashes or ulcers:        Constitutional     Fever or chills:      PHYSICAL EXAMINATION:  Vitals:   06/06/19 1304  Weight: 123 lb (55.8 kg)  Height: 5\' 2"  (1.575 m)    General:  WDWN in NAD; vital signs documented above Gait: Not observed HENT: WNL, normocephalic Pulmonary: normal non-labored breathing , without Rales, rhonchi,  wheezing Cardiac: regular HR, without  Murmurs; without carotid bruits Abdomen: soft, NT, no masses Skin: without rashes Vascular Exam/Pulses:  Right Left  Femoral 2+ (normal) 2+ (normal)  Popliteal Not palpable Not palpable  DP  biphasic Doppler  phasic Doppler  PT  phasic Doppler  biphasic Doppler   Extremities: without ischemic changes, without Gangrene , without cellulitis; without open wounds; she has an element of dependent rubor left greater than right.  Her skin and nails are intact.  She has active range of motion of both feet. Musculoskeletal: no muscle wasting or atrophy  Neurologic: A&O X 3;  No focal weakness or paresthesias are detected Psychiatric:  The pt has Normal affect.    Previous ABI's/TBI's on 03/09/2019: Right:  0.56 Left:  0.45  ABI Findings: +---------+------------------+-----+--------+---------------+ Right    Rt Pressure (mmHg)IndexWaveformComment         +---------+------------------+-----+--------+---------------+ Brachial 141                                            +---------+------------------+-----+--------+---------------+ PTA      79                0.56 biphasic                +---------+------------------+-----+--------+---------------+ DP       68                0.48 biphasic                +---------+------------------+-----+--------+---------------+ Great Toe                       Absent  Not discernable +---------+------------------+-----+--------+---------------+  +---------+------------------+-----+----------+---------------+ Left     Lt Pressure (mmHg)IndexWaveform  Comment          +---------+------------------+-----+----------+---------------+ Brachial 136                                              +---------+------------------+-----+----------+---------------+ PTA      63                0.45 biphasic                  +---------+------------------+-----+----------+---------------+  DP       62                0.44 monophasic                +---------+------------------+-----+----------+---------------+ Great Toe                       Absent    not discernable +---------+------------------+-----+----------+---------------+  Right ABIs appear increased compared to prior study on 10/07/2017. Left ABIs appear essentially unchanged.   Summary: Right: Resting right ankle-brachial index indicates moderate right lower extremity arterial disease.  Left: Resting left ankle-brachial index indicates severe left lower extremity arterial disease.  ABI 10/07/2017: Right:  0.45 Left:  0.33   ASSESSMENT/PLAN:: 79 y.o. female here for follow up for PAD now with chronic left thigh radiating into knee which started after ORIF of left femur fracture and use of electric stimulator.  I do not see any acute changes in her vascular status.  I encouraged her to quit smoking, continue exercise and to follow-up with her orthopedic surgeon.  -Follow-up as instructed in October 2021 for repeat [place], PA-C [facility] [phone]  Clinic MD:   Scot Dock

## 2019-06-05 NOTE — Telephone Encounter (Signed)
Pt called c/o left foot "turning black" and occasional "shooting pain". She had seen her PCP 3 weeks ago and her orthopedic dr, as she was using a "machine that puts pressure on her leg". She had stopped using this machine 2 weeks ago and her leg concerns are not resolving and would like to be seen in office. Scheduled her an appt with PA tomorrow.

## 2019-06-06 ENCOUNTER — Ambulatory Visit (INDEPENDENT_AMBULATORY_CARE_PROVIDER_SITE_OTHER): Payer: Medicare Other | Admitting: Physician Assistant

## 2019-06-06 ENCOUNTER — Other Ambulatory Visit: Payer: Self-pay

## 2019-06-06 VITALS — BP 141/64 | HR 67 | Temp 97.3°F | Resp 16 | Ht 62.0 in | Wt 123.0 lb

## 2019-06-06 DIAGNOSIS — I739 Peripheral vascular disease, unspecified: Secondary | ICD-10-CM | POA: Diagnosis not present

## 2019-06-07 ENCOUNTER — Other Ambulatory Visit: Payer: Self-pay | Admitting: *Deleted

## 2019-06-07 DIAGNOSIS — I739 Peripheral vascular disease, unspecified: Secondary | ICD-10-CM

## 2019-06-10 DIAGNOSIS — S7292XA Unspecified fracture of left femur, initial encounter for closed fracture: Secondary | ICD-10-CM | POA: Diagnosis not present

## 2019-07-11 DIAGNOSIS — S7292XA Unspecified fracture of left femur, initial encounter for closed fracture: Secondary | ICD-10-CM | POA: Diagnosis not present

## 2019-07-19 DIAGNOSIS — I480 Paroxysmal atrial fibrillation: Secondary | ICD-10-CM | POA: Diagnosis not present

## 2019-07-19 DIAGNOSIS — E1139 Type 2 diabetes mellitus with other diabetic ophthalmic complication: Secondary | ICD-10-CM | POA: Diagnosis not present

## 2019-07-19 DIAGNOSIS — Z79899 Other long term (current) drug therapy: Secondary | ICD-10-CM | POA: Diagnosis not present

## 2019-07-19 DIAGNOSIS — E785 Hyperlipidemia, unspecified: Secondary | ICD-10-CM | POA: Diagnosis not present

## 2019-08-02 DIAGNOSIS — H40003 Preglaucoma, unspecified, bilateral: Secondary | ICD-10-CM | POA: Diagnosis not present

## 2019-08-02 DIAGNOSIS — Z961 Presence of intraocular lens: Secondary | ICD-10-CM | POA: Diagnosis not present

## 2019-08-02 DIAGNOSIS — H353231 Exudative age-related macular degeneration, bilateral, with active choroidal neovascularization: Secondary | ICD-10-CM | POA: Diagnosis not present

## 2019-08-02 DIAGNOSIS — E1139 Type 2 diabetes mellitus with other diabetic ophthalmic complication: Secondary | ICD-10-CM | POA: Diagnosis not present

## 2019-08-09 DIAGNOSIS — E1139 Type 2 diabetes mellitus with other diabetic ophthalmic complication: Secondary | ICD-10-CM | POA: Diagnosis not present

## 2019-08-09 DIAGNOSIS — E785 Hyperlipidemia, unspecified: Secondary | ICD-10-CM | POA: Diagnosis not present

## 2019-08-21 ENCOUNTER — Other Ambulatory Visit: Payer: Self-pay | Admitting: Internal Medicine

## 2019-08-24 ENCOUNTER — Telehealth: Payer: Self-pay | Admitting: Internal Medicine

## 2019-08-24 MED ORDER — FLECAINIDE ACETATE 100 MG PO TABS: 100.0000 mg | ORAL_TABLET | Freq: Two times a day (BID) | ORAL | 2 refills | Status: DC

## 2019-08-24 NOTE — Telephone Encounter (Signed)
Pt's medication was sent to pt's pharmacy as requested. Confirmation received.  °

## 2019-08-24 NOTE — Telephone Encounter (Signed)
*  STAT* If patient is at the pharmacy, call can be transferred to refill team.   1. Which medications need to be refilled? (please list name of each medication and dose if known)  flecainide (TAMBOCOR) 100 MG tablet  2. Which pharmacy/location (including street and city if local pharmacy) is medication to be sent to? [facility] in [place]   3. Do they need a 30 day or 90 day supply? 90 day supply

## 2019-09-01 DIAGNOSIS — E1139 Type 2 diabetes mellitus with other diabetic ophthalmic complication: Secondary | ICD-10-CM | POA: Diagnosis not present

## 2019-09-20 DIAGNOSIS — H353231 Exudative age-related macular degeneration, bilateral, with active choroidal neovascularization: Secondary | ICD-10-CM | POA: Diagnosis not present

## 2019-09-20 DIAGNOSIS — Z961 Presence of intraocular lens: Secondary | ICD-10-CM | POA: Diagnosis not present

## 2019-09-20 DIAGNOSIS — H40003 Preglaucoma, unspecified, bilateral: Secondary | ICD-10-CM | POA: Diagnosis not present

## 2019-10-01 DIAGNOSIS — E1139 Type 2 diabetes mellitus with other diabetic ophthalmic complication: Secondary | ICD-10-CM | POA: Diagnosis not present

## 2019-10-29 DIAGNOSIS — I480 Paroxysmal atrial fibrillation: Secondary | ICD-10-CM | POA: Diagnosis not present

## 2019-10-29 DIAGNOSIS — E785 Hyperlipidemia, unspecified: Secondary | ICD-10-CM | POA: Diagnosis not present

## 2019-10-29 DIAGNOSIS — E1139 Type 2 diabetes mellitus with other diabetic ophthalmic complication: Secondary | ICD-10-CM | POA: Diagnosis not present

## 2019-10-29 DIAGNOSIS — Z79899 Other long term (current) drug therapy: Secondary | ICD-10-CM | POA: Diagnosis not present

## 2019-11-05 DIAGNOSIS — E1139 Type 2 diabetes mellitus with other diabetic ophthalmic complication: Secondary | ICD-10-CM | POA: Diagnosis not present

## 2019-11-05 DIAGNOSIS — M79605 Pain in left leg: Secondary | ICD-10-CM | POA: Diagnosis not present

## 2019-11-05 DIAGNOSIS — Z6823 Body mass index (BMI) 23.0-23.9, adult: Secondary | ICD-10-CM | POA: Diagnosis not present

## 2019-11-05 DIAGNOSIS — E785 Hyperlipidemia, unspecified: Secondary | ICD-10-CM | POA: Diagnosis not present

## 2019-11-06 DIAGNOSIS — S72462D Displaced supracondylar fracture with intracondylar extension of lower end of left femur, subsequent encounter for closed fracture with routine healing: Secondary | ICD-10-CM | POA: Diagnosis not present

## 2019-11-08 DIAGNOSIS — H353231 Exudative age-related macular degeneration, bilateral, with active choroidal neovascularization: Secondary | ICD-10-CM | POA: Diagnosis not present

## 2019-11-08 DIAGNOSIS — Z961 Presence of intraocular lens: Secondary | ICD-10-CM | POA: Diagnosis not present

## 2019-11-09 ENCOUNTER — Ambulatory Visit: Payer: Self-pay | Admitting: Student

## 2019-11-09 DIAGNOSIS — E1139 Type 2 diabetes mellitus with other diabetic ophthalmic complication: Secondary | ICD-10-CM | POA: Diagnosis not present

## 2019-11-09 DIAGNOSIS — S72402D Unspecified fracture of lower end of left femur, subsequent encounter for closed fracture with routine healing: Secondary | ICD-10-CM | POA: Insufficient documentation

## 2019-11-09 DIAGNOSIS — S72492D Other fracture of lower end of left femur, subsequent encounter for closed fracture with routine healing: Secondary | ICD-10-CM

## 2019-12-09 DIAGNOSIS — E1139 Type 2 diabetes mellitus with other diabetic ophthalmic complication: Secondary | ICD-10-CM | POA: Diagnosis not present

## 2019-12-11 ENCOUNTER — Encounter (INDEPENDENT_AMBULATORY_CARE_PROVIDER_SITE_OTHER): Payer: Self-pay | Admitting: *Deleted

## 2019-12-20 DIAGNOSIS — Z961 Presence of intraocular lens: Secondary | ICD-10-CM | POA: Diagnosis not present

## 2019-12-20 DIAGNOSIS — H353231 Exudative age-related macular degeneration, bilateral, with active choroidal neovascularization: Secondary | ICD-10-CM | POA: Diagnosis not present

## 2019-12-28 ENCOUNTER — Ambulatory Visit: Admit: 2019-12-28 | Payer: Medicare Other | Admitting: Student

## 2019-12-28 SURGERY — REMOVAL, HARDWARE
Anesthesia: General | Laterality: Left

## 2020-01-03 IMAGING — DX LEFT FEMUR PORTABLE 2 VIEWS
4 series · 4 of 4 positions shown · non-contrast
Comparison: 11/03/2018

CLINICAL DATA: Postop

EXAM:
LEFT FEMUR PORTABLE 2 VIEWS

[femur ap (1 of 2)]
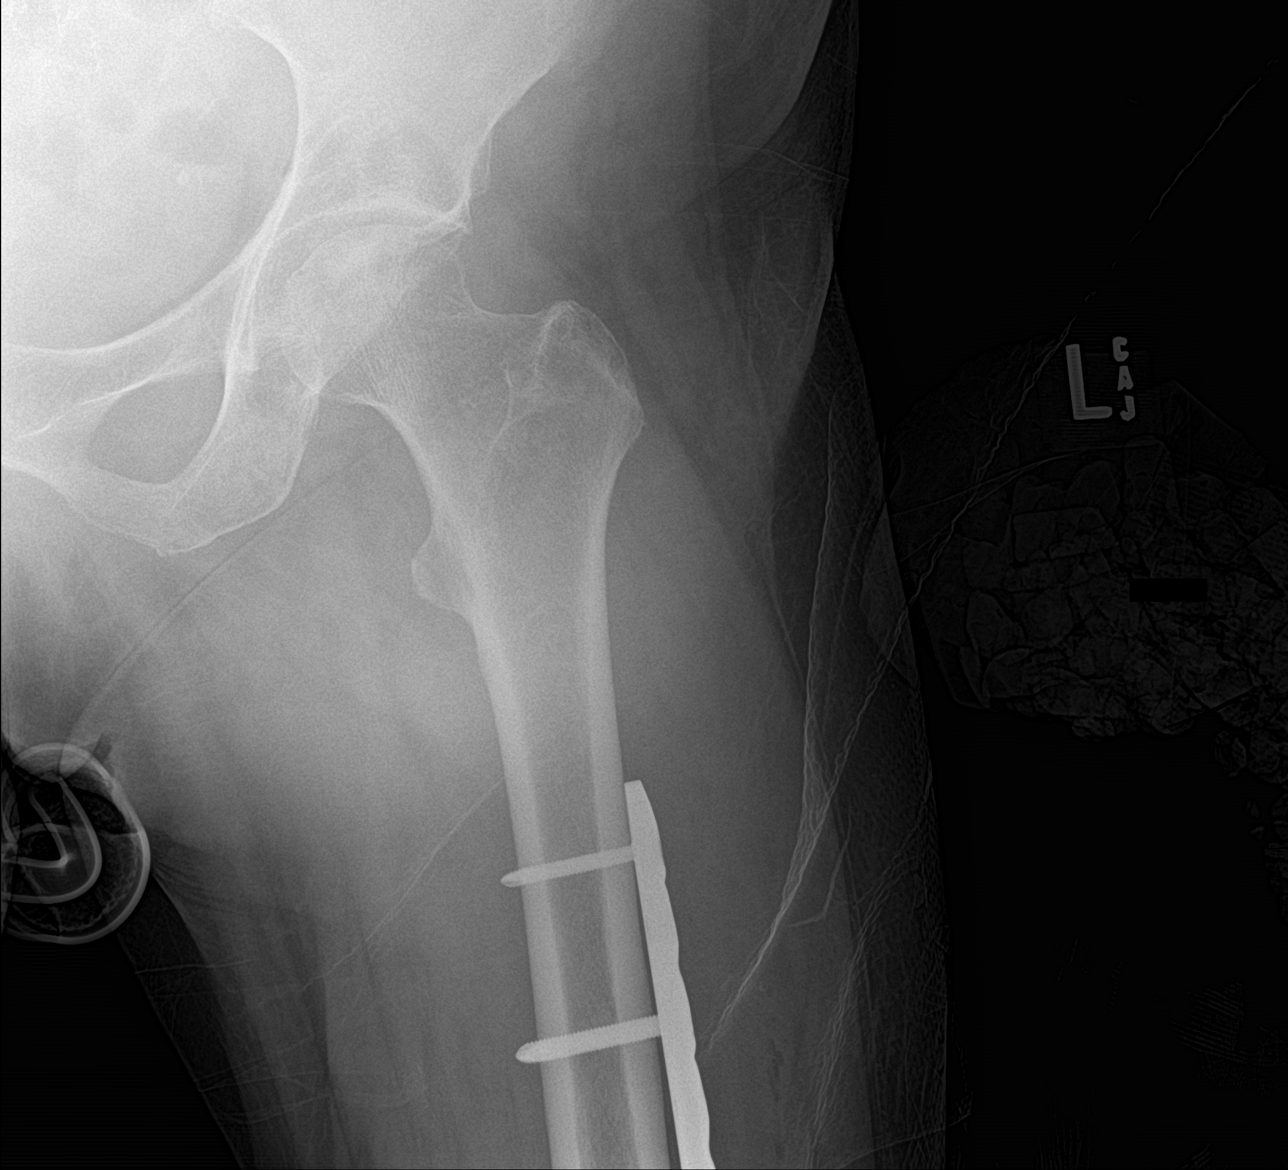

[femur ap (2 of 2)]
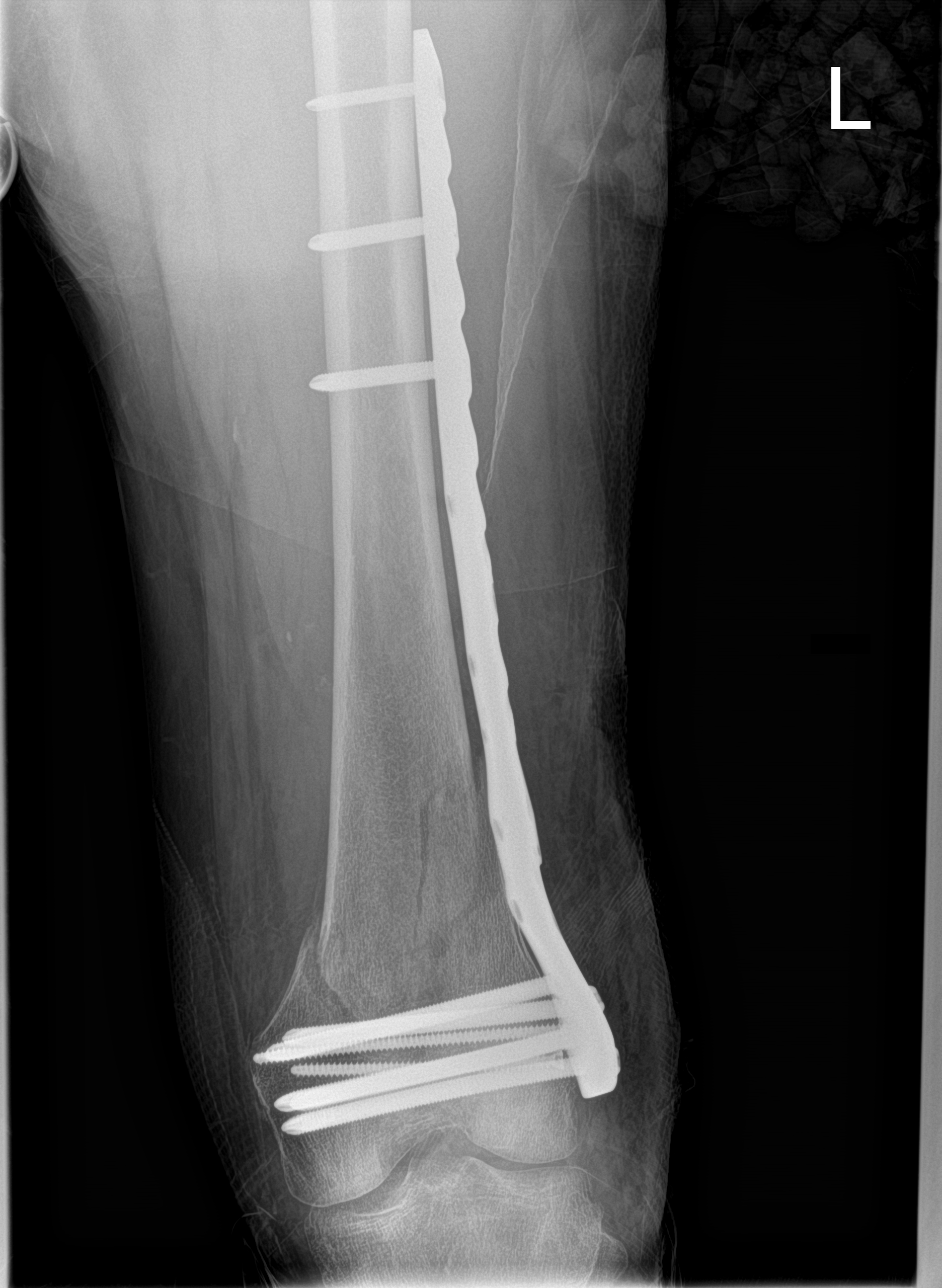

[femur lat (1 of 2)]
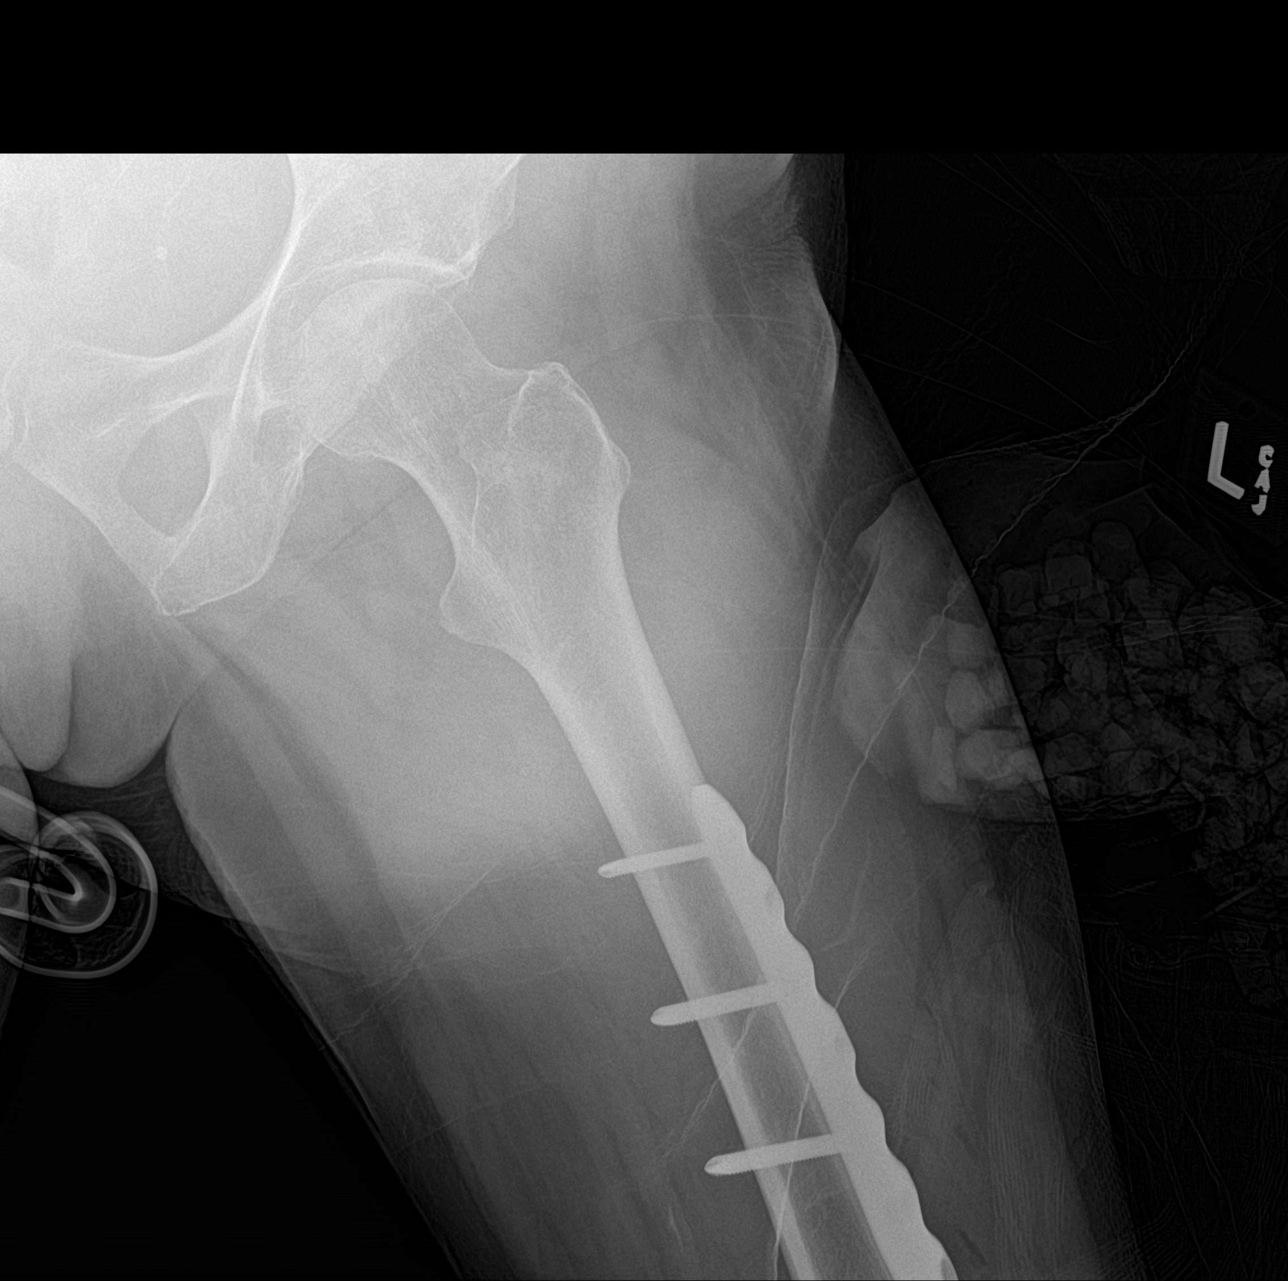

[femur lat (2 of 2)]
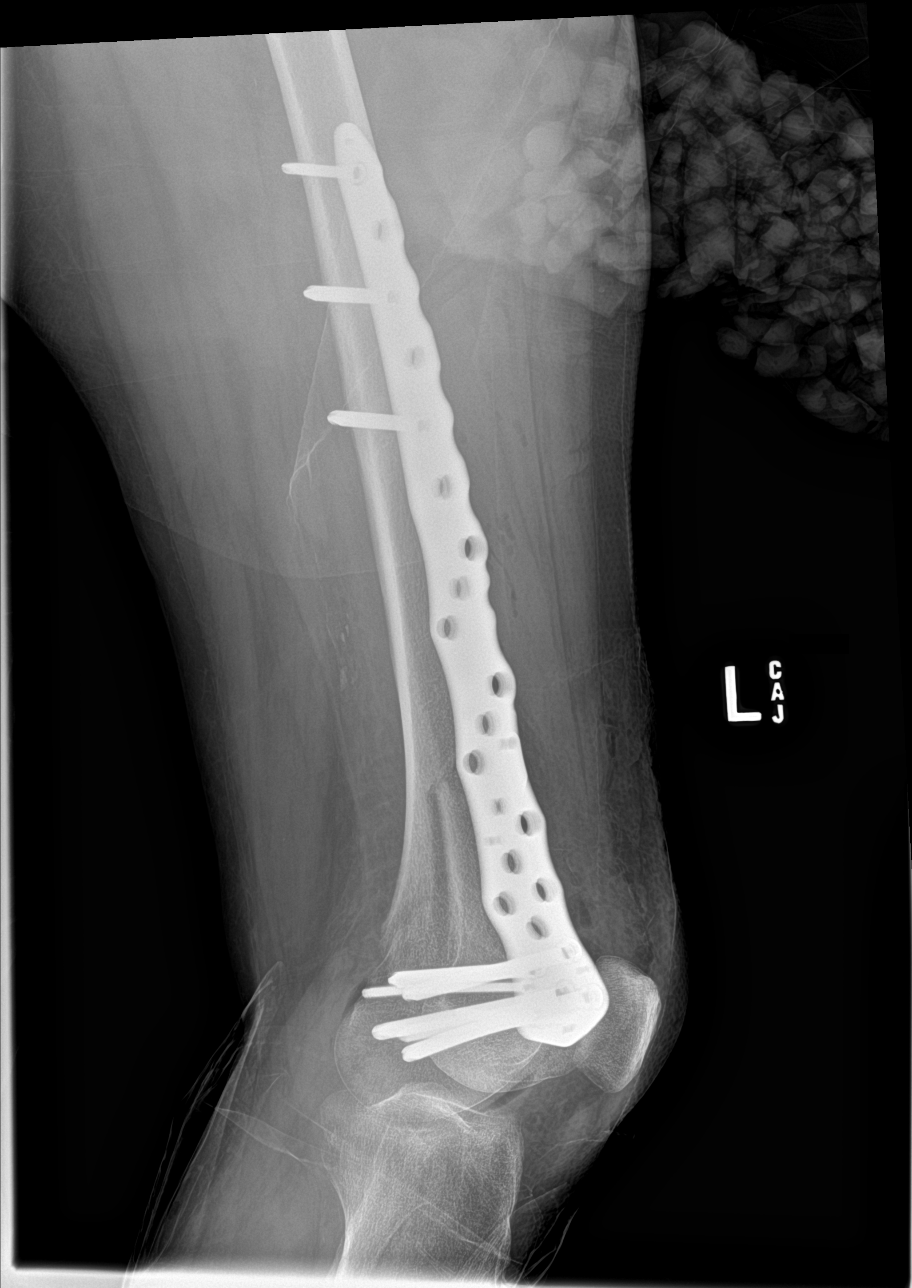

[4 of 4 positions shown; findings below may reference images not displayed]

FINDINGS: Plate and screw fixation device noted across the distal femoral
fracture. No hardware complicating feature. Near anatomic alignment.
IMPRESSION: Internal fixation of the distal femoral fracture with near anatomic
alignment.

## 2020-01-03 IMAGING — RF LEFT FEMUR 2 VIEWS
1 series · 8 of 8 positions shown · non-contrast
Comparison: 11/03/2018

CLINICAL DATA: ORIF distal femur fracture

EXAM:
DG C-ARM 61-120 MIN; LEFT FEMUR 2 VIEWS

[Series 1: run · 8 of 8 slices shown]
[im 1/8]
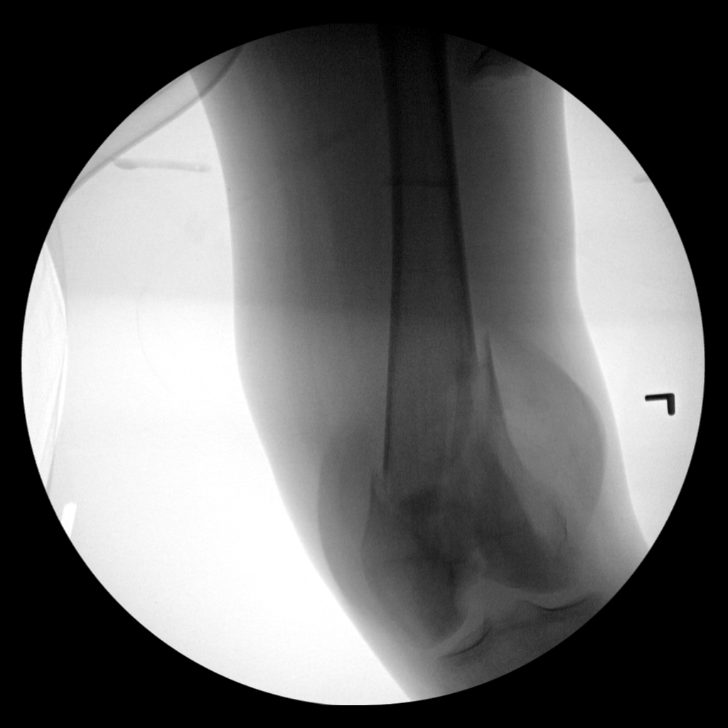
[im 2/8]
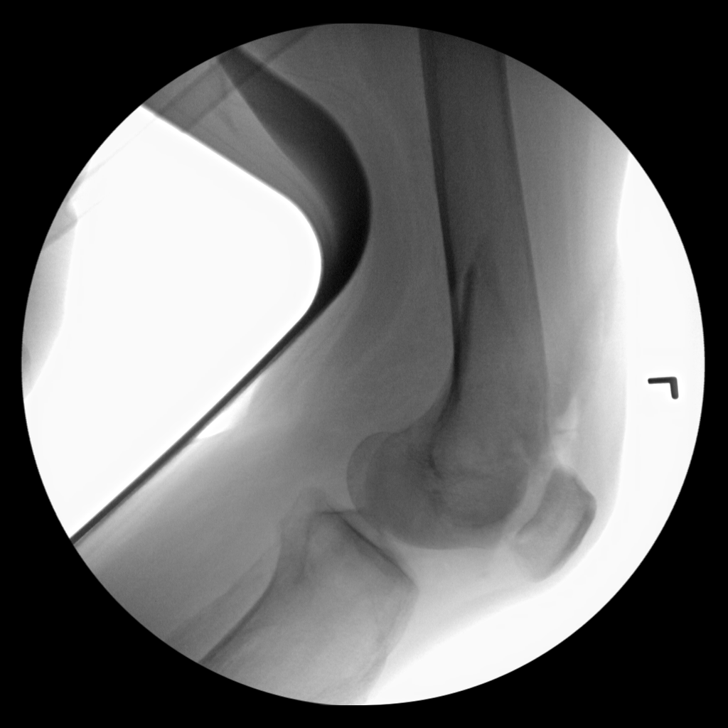
[im 3/8]
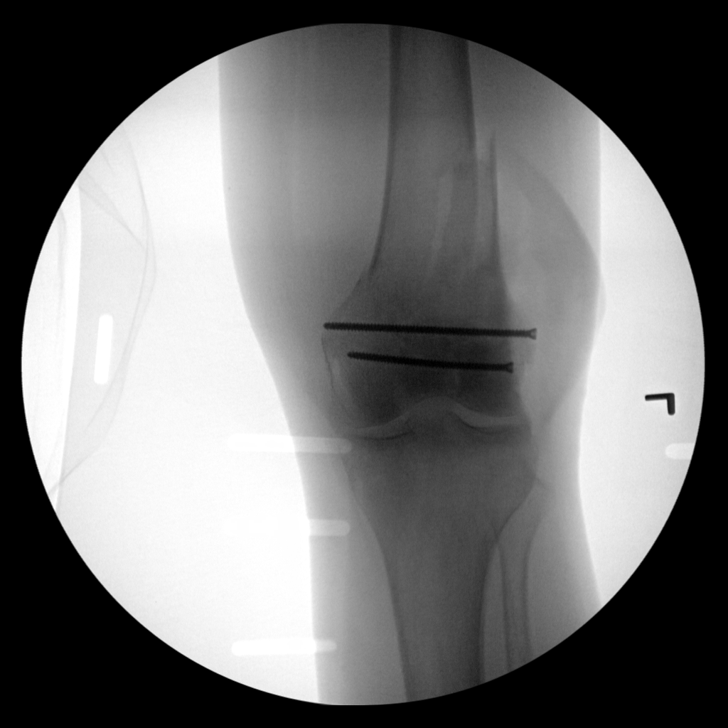
[im 4/8]
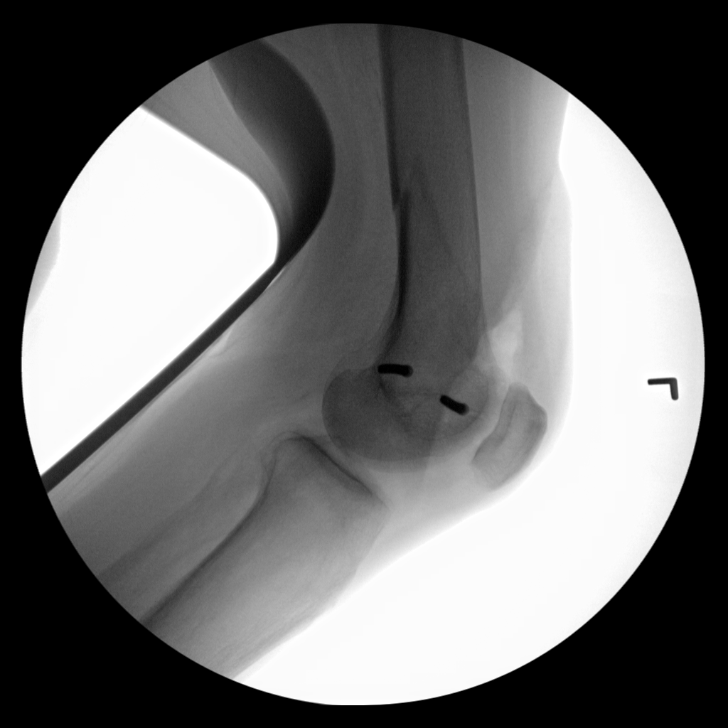
[im 5/8]
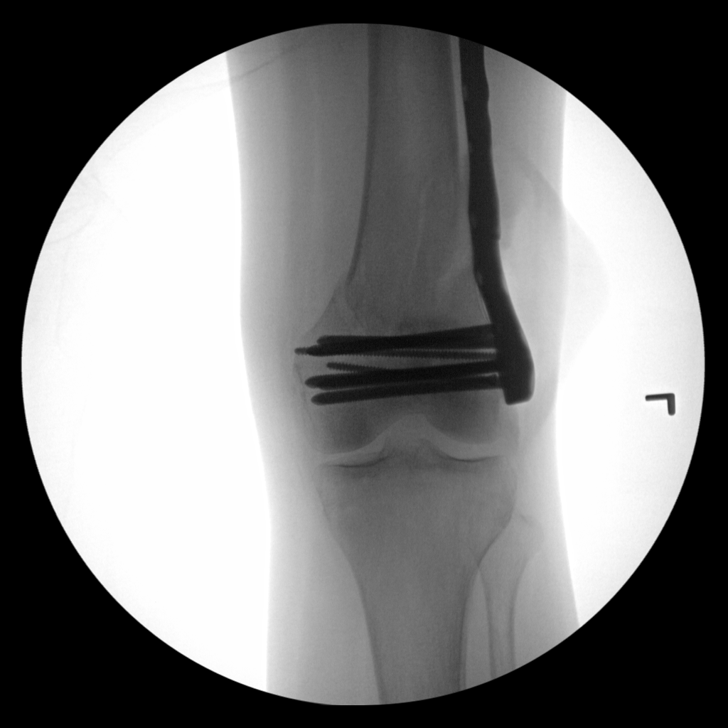
[im 6/8]
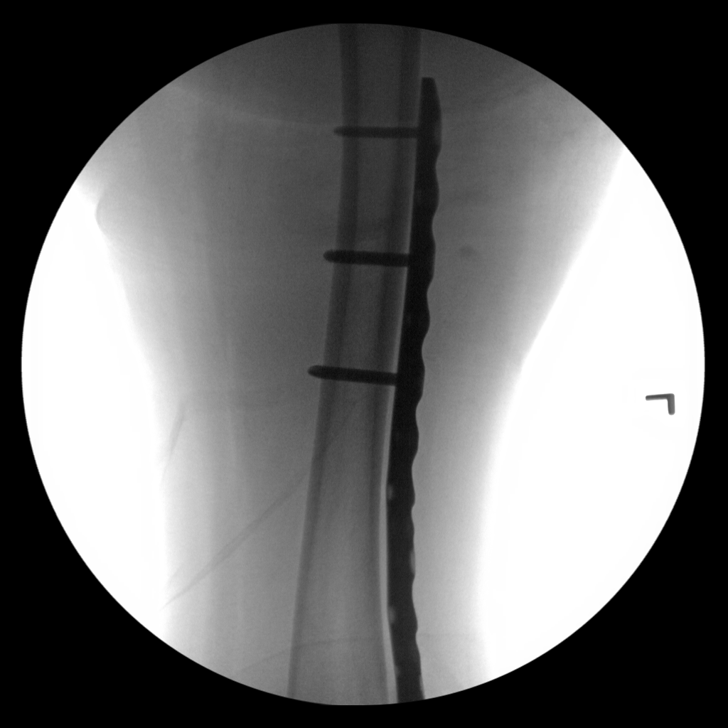
[im 7/8]
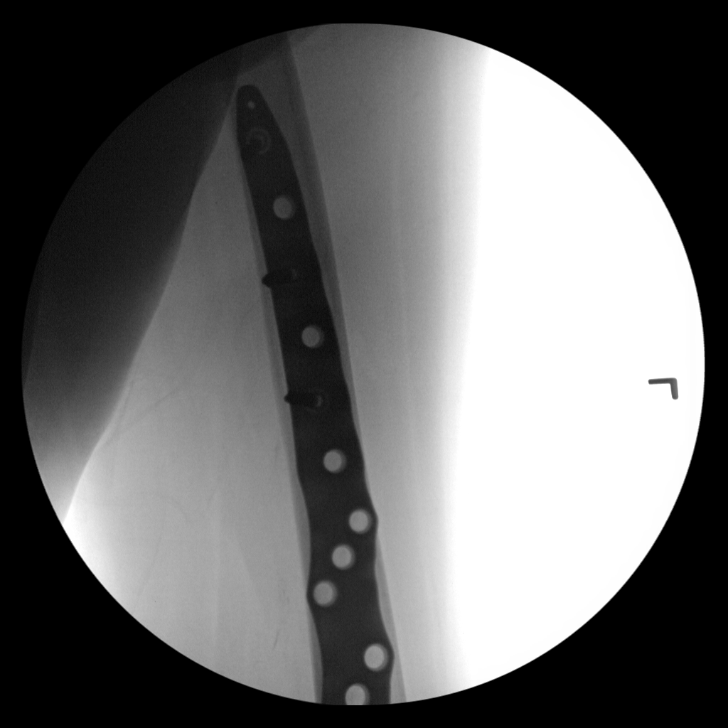
[im 8/8]
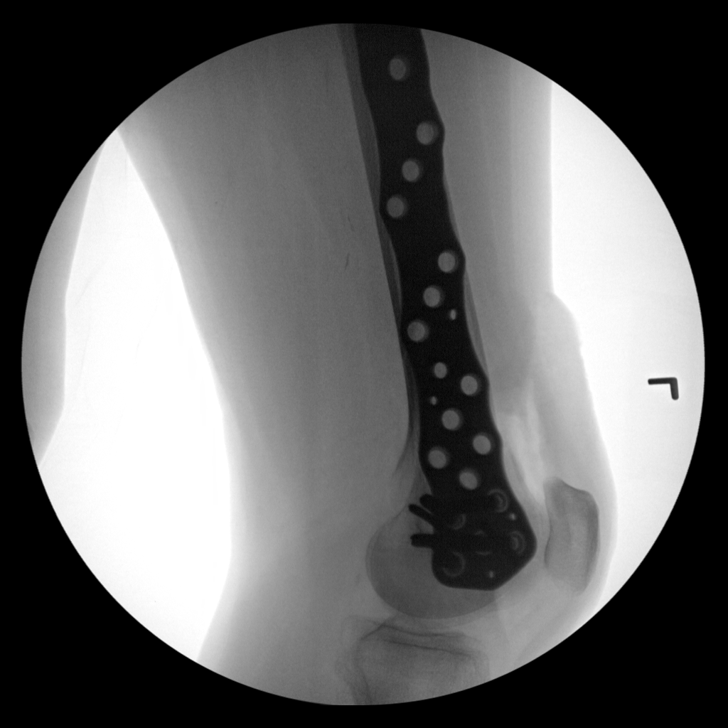

[8 of 8 positions shown; findings below may reference images not displayed]

FINDINGS: Multiple intraoperative spot images demonstrate plate and screw
fixation across the distal femoral fracture. Near anatomic
alignment. No hardware complicating feature.
IMPRESSION: Internal fixation across the distal left femoral fracture with near
anatomic alignment.

## 2020-01-08 DIAGNOSIS — E1139 Type 2 diabetes mellitus with other diabetic ophthalmic complication: Secondary | ICD-10-CM | POA: Diagnosis not present

## 2020-01-22 DIAGNOSIS — Z0120 Encounter for dental examination and cleaning without abnormal findings: Secondary | ICD-10-CM | POA: Diagnosis not present

## 2020-01-25 ENCOUNTER — Encounter: Payer: Self-pay | Admitting: Internal Medicine

## 2020-01-25 ENCOUNTER — Telehealth: Payer: Self-pay | Admitting: Internal Medicine

## 2020-01-25 ENCOUNTER — Other Ambulatory Visit: Payer: Self-pay

## 2020-01-25 ENCOUNTER — Ambulatory Visit: Payer: Medicare Other | Admitting: Internal Medicine

## 2020-01-25 VITALS — BP 160/78 | HR 59 | Ht 63.0 in | Wt 128.4 lb

## 2020-01-25 DIAGNOSIS — Z79899 Other long term (current) drug therapy: Secondary | ICD-10-CM | POA: Diagnosis not present

## 2020-01-25 DIAGNOSIS — I739 Peripheral vascular disease, unspecified: Secondary | ICD-10-CM | POA: Diagnosis not present

## 2020-01-25 DIAGNOSIS — I480 Paroxysmal atrial fibrillation: Secondary | ICD-10-CM

## 2020-01-25 NOTE — Telephone Encounter (Signed)
Patient states she is returning two calls from today. I did not see a note.

## 2020-01-25 NOTE — Patient Instructions (Signed)
Medication Instructions:  Your physician recommends that you continue on your current medications as directed. Please refer to the Current Medication list given to you today.  *If you need a refill on your cardiac medications before your next appointment, please call your pharmacy*   Lab Work: CRP and ESR today   If you have labs (blood work) drawn today and your tests are completely normal, you will receive your results only by: Marland Kitchen MyChart Message (if you have MyChart) OR . A paper copy in the mail If you have any lab test that is abnormal or we need to change your treatment, we will call you to review the results.   Testing/Procedures: none   Follow-Up: At [facility], you and your health needs are our priority.  As part of our continuing mission to provide you with exceptional heart care, we have created designated Provider Care Teams.  These Care Teams include your primary Cardiologist (physician) and Advanced Practice Providers (APPs -  Physician Assistants and Nurse Practitioners) who all work together to provide you with the care you need, when you need it.  We recommend signing up for the patient portal called "MyChart".  Sign up information is provided on this After Visit Summary.  MyChart is used to connect with patients for Virtual Visits (Telemedicine).  Patients are able to view lab/test results, encounter notes, upcoming appointments, etc.  Non-urgent messages can be sent to your provider as well.   To learn more about what you can do with MyChart, go to [email or web address].    Your next appointment:   12 month(s)  The format for your next appointment:   In Person  Provider:   Virl Axe, MD   Other Instructions

## 2020-01-25 NOTE — Telephone Encounter (Signed)
Spoke with the patient and let her know that I did not see any documentation where she was called by our office so I was unable to route her call to anyone at this time. She verbalized understanding.

## 2020-01-25 NOTE — Progress Notes (Signed)
Patient Care Team: Asencion Noble, MD as PCP - General (Internal Medicine) Deboraha Sprang, MD as PCP - Cardiology (Cardiology) Donia Guiles Lavon Paganini, PA-C as Physician Assistant Deboraha Sprang, MD as Consulting Physician (Cardiology)   HPI  Barbara [name] is a 79 y.o. female Seen in follow-up for atrial fibrillation.   Treated with flecainide.  My notes from 1 year ago has suggested that we would be stopping flecainide because of lack of efficacy and (amiodarone.  But never started.  5/19 developed wide-complex tachycardia that was thought to be SVT with aberration again  amiodarone started and then discontinued  She has had recurrent bleeding on anticoagulation.  Not anticoagulated presently.  No intercurrent palps on flecainide  No chest pain or shortness of breath, no edema    DATE TEST EF   3/15 Echo   65-70 %   10/15 Myoview    65 % No ischemia  5/19 Echo 65-70%   6/20 Echo  60-65%       Date Cr K TSH LFTs LDL  2/19 0.85 4.8   177   5/19 0.82 4.2 7.4 13   12/20 1.03 4.0  17    DATE PR interval QRSduration Flecainide  3/17  144 070 0  6/19 200 94 100  9/21 196 88 100        Past Medical History:  Diagnosis Date  . Arteriosclerotic cardiovascular disease (ASCVD)    non-obstructive; negative stress nuclear and normal [facility] in 03/2000-20%  main 20% LAD, 30% circumflex, 30% RCA, hyperdynamic LV  . Asthma    Mild  . Chest pain    none recent  . Chronic back pain   . Diabetes mellitus    type 2  . GERD (gastroesophageal reflux disease)   . Glaucoma    both eyes  . Hyperlipidemia    total cholesterol of 271 and LDL of 187 in 07/2010  . Hypertension   . Macular degeneration   . PAF (paroxysmal atrial fibrillation) ([place])   . Palpitations   . Stroke [facility]) 1993 2008   short term memory loss  . Tobacco abuse     Past Surgical History:  Procedure Laterality Date  . APPENDECTOMY    . CATARACT EXTRACTION      left  . COLECTOMY WITH COLOSTOMY CREATION/HARTMANN PROCEDURE Left 09/29/2017   Procedure: EXTENDED LEFT HEMI COLECTOMY WITH COLOSTOMY CREATION (HARTMANN PROCEDURE);  Surgeon: Greer Pickerel, MD;  Location: [place];  Service: General;  Laterality: Left;  . COLONOSCOPY  2009  . COLONOSCOPY  03/30/2012   Procedure: COLONOSCOPY;  Surgeon: Rogene Houston, MD;  [facility];  Service: Endoscopy;  Laterality: N/A;  730  . COLONOSCOPY N/A 12/13/2013   Procedure: COLONOSCOPY;  Surgeon: Rogene Houston, MD;  [facility];  Service: Endoscopy;  Laterality: N/A;  1200  . COLONOSCOPY N/A 05/25/2018   Procedure: COLONOSCOPY THROUGH OSTOMY;  Surgeon: Leighton Ruff, MD;  [facility];  Service: Endoscopy;  Laterality: N/A;  . COLOSTOMY TAKEDOWN N/A 05/26/2018   Procedure: LAPAROSCOPIC COLOSTOMY REVERSAL ERAS PATHWAY;  Surgeon: Leighton Ruff, MD;  [facility];  Service: General;  Laterality: N/A;  . ESOPHAGOGASTRODUODENOSCOPY  10/21/2011   Procedure: ESOPHAGOGASTRODUODENOSCOPY (EGD);  Surgeon: Rogene Houston, MD;  [facility];  Service: Endoscopy;  Laterality: N/A;  1200  . ESOPHAGOGASTRODUODENOSCOPY N/A 12/13/2013   Procedure: ESOPHAGOGASTRODUODENOSCOPY (EGD);  Surgeon: Rogene Houston, MD;  [facility];  Service: Endoscopy;  Laterality: N/A;  . LAPAROTOMY N/A 09/29/2017   Procedure: EXPLORATORY LAPAROTOMY;  Surgeon: Gaynelle Adu, MD;  [facility];  Service: General;  Laterality: N/A;  . ORIF FEMUR FRACTURE Left 11/04/2018   Procedure: OPEN REDUCTION INTERNAL FIXATION (ORIF) DISTAL FEMUR FRACTURE;  Surgeon: Roby Lofts, MD;  [facility];  Service: Orthopedics;  Laterality: Left;  . POLYPECTOMY  05/25/2018   Procedure: POLYPECTOMY;  Surgeon: Romie Levee, MD;  [facility];  Service: Endoscopy;;  . TUBAL LIGATION      Current Outpatient Medications  Medication Sig Dispense Refill  . Ascorbic Acid (VITAMIN C) 1000 MG tablet Take 1,000 mg  by mouth daily.     Marland Kitchen aspirin 325 MG tablet Take 0.5 tablets (162.5 mg total) by mouth at bedtime. Restart after completing Lovenox    . baclofen (LIORESAL) 10 MG tablet SMARTSIG:1-2.5 Tablet(s) By Mouth Twice Daily PRN    . Biotin 1000 MCG tablet Take 1,000 mcg by mouth daily.     . brimonidine (ALPHAGAN) 0.2 % ophthalmic solution Place 1 drop into both eyes 3 (three) times daily.   1  . Cyanocobalamin (VITAMIN B-12) 2500 MCG SUBL Place 2,500 mcg under the tongue daily.    . flecainide (TAMBOCOR) 100 MG tablet Take 1 tablet (100 mg total) by mouth 2 (two) times daily. 180 tablet 2  . HM VITAMIN D3 100 MCG (4000 UT) CAPS Take 1 capsule by mouth daily.    . Insulin Glargine (LANTUS) 100 UNIT/ML Solostar Pen Inject 14 Units into the skin daily at 10 pm. 15 mL 11  . insulin regular (NOVOLIN R,HUMULIN R) 100 units/mL injection Inject 6 Units into the skin 3 (three) times daily before meals. As directed per sliding scale    . metoprolol tartrate (LOPRESSOR) 25 MG tablet Take 0.5 tablets (12.5 mg total) by mouth 2 (two) times daily. 60 tablet 0  . Multiple Vitamins-Minerals (PRESERVISION AREDS 2) CAPS Take 1 capsule by mouth 2 (two) times daily.    . nitroGLYCERIN (NITROSTAT) 0.4 MG SL tablet Place 1 tablet (0.4 mg total) under the tongue every 5 (five) minutes x 3 doses as needed for chest pain. 25 tablet 3  . Polyethyl Glycol-Propyl Glycol (SYSTANE) 0.4-0.3 % SOLN Place 1 drop into both eyes 2 (two) times daily.     Marland Kitchen senna-docusate (SENOKOT-S) 8.6-50 MG tablet Take 1 tablet by mouth daily.     No current facility-administered medications for this visit.    Allergies  Allergen Reactions  . Bee Venom Anaphylaxis  . Sulfamethoxazole Nausea And Vomiting  . Penicillins Itching and Rash       . Sulfonamide Derivatives Nausea And Vomiting    Review of Systems negative except from HPI and PMH  Physical Exam BP (!) 160/78   Pulse (!) 59   Ht 5\' 3"  (1.6 m)   Wt 128 lb 6.4 oz (58.2 kg)   SpO2  97%   BMI 22.75 kg/m  Well developed and nourished in no acute distress HENT normal Neck supple with JVP-  Flat  Clear Regular rate and rhythm, no murmurs or gallops Abd-soft with active BS No Clubbing cyanosis edema Skin-warm and dry A & Oriented  Grossly normal sensory and motor function  ECG  Sinus @ 58 20/09/47    Assessment and  Plan  Atrial tachycardia-nonsustained complex atrial ectopy  Atrial fibrillation with a rapid rate-paroxysmal  Hypertension  Dyslipidemia   Sinus bradycardia   History of GI  bleeding recurrent on apixoban  eval neg  Tobacco abuse  Myalgias proximal shoulder and leg  Carotid stenosis 50-69 1/18    She is very pleased on the flecainide with scant palps  She in not on anticoagulation 2/2 bleeding but is on ASA 162 but primarily for her vascular disease   BP elevated today but says mostly at home is about 120  With her muscle aches, will get CRP and ESR to exclude polymyalgia

## 2020-01-26 LAB — SEDIMENTATION RATE: Sed Rate: 4 mm/hr (ref 0–40)

## 2020-01-26 LAB — C-REACTIVE PROTEIN: CRP: 3 mg/L (ref 0–10)

## 2020-01-29 ENCOUNTER — Telehealth: Payer: Self-pay | Admitting: Internal Medicine

## 2020-01-29 NOTE — Telephone Encounter (Signed)
    Pt is calling about lab results

## 2020-01-30 NOTE — Telephone Encounter (Signed)
Spoke with Barbara [name] and advised Dr Caryl Comes has not yet reviewed lab results but both labs appear to be within normal limits.  Once Dr Caryl Comes has reviewed and signed off on the labs Barbara [name] will be contacted if any further direction is indicated.  Barbara [name] thanked Therapist, sports for call.

## 2020-01-30 NOTE — Telephone Encounter (Signed)
Barbara [name] is calling back due to not hearing back in regards to her results. Please advise.

## 2020-01-30 NOTE — Telephone Encounter (Signed)
Patient calling for lab results. She states she can speak with anyone.

## 2020-02-01 DIAGNOSIS — E1139 Type 2 diabetes mellitus with other diabetic ophthalmic complication: Secondary | ICD-10-CM | POA: Diagnosis not present

## 2020-02-07 DIAGNOSIS — H353231 Exudative age-related macular degeneration, bilateral, with active choroidal neovascularization: Secondary | ICD-10-CM | POA: Diagnosis not present

## 2020-02-07 DIAGNOSIS — Z961 Presence of intraocular lens: Secondary | ICD-10-CM | POA: Diagnosis not present

## 2020-02-08 ENCOUNTER — Telehealth: Payer: Self-pay

## 2020-02-08 DIAGNOSIS — M150 Primary generalized (osteo)arthritis: Secondary | ICD-10-CM | POA: Diagnosis not present

## 2020-02-08 DIAGNOSIS — E1139 Type 2 diabetes mellitus with other diabetic ophthalmic complication: Secondary | ICD-10-CM | POA: Diagnosis not present

## 2020-02-08 DIAGNOSIS — I482 Chronic atrial fibrillation, unspecified: Secondary | ICD-10-CM | POA: Diagnosis not present

## 2020-02-08 DIAGNOSIS — R739 Hyperglycemia, unspecified: Secondary | ICD-10-CM | POA: Diagnosis not present

## 2020-02-08 NOTE — Telephone Encounter (Signed)
Received phone call from Kathlee Nations at Dr Knute Neu office requesting Sed rate and CRP lab results.  Pt gave verbal permission to share results.  CRP - 3 and Sed rate - 4.  Dr Ria Comment office thanked RN for information.

## 2020-02-16 DIAGNOSIS — E1139 Type 2 diabetes mellitus with other diabetic ophthalmic complication: Secondary | ICD-10-CM | POA: Diagnosis not present

## 2020-03-13 DIAGNOSIS — H353231 Exudative age-related macular degeneration, bilateral, with active choroidal neovascularization: Secondary | ICD-10-CM | POA: Diagnosis not present

## 2020-03-13 DIAGNOSIS — Z961 Presence of intraocular lens: Secondary | ICD-10-CM | POA: Diagnosis not present

## 2020-03-17 DIAGNOSIS — E1139 Type 2 diabetes mellitus with other diabetic ophthalmic complication: Secondary | ICD-10-CM | POA: Diagnosis not present

## 2020-04-16 DIAGNOSIS — E1139 Type 2 diabetes mellitus with other diabetic ophthalmic complication: Secondary | ICD-10-CM | POA: Diagnosis not present

## 2020-05-01 DIAGNOSIS — Z961 Presence of intraocular lens: Secondary | ICD-10-CM | POA: Diagnosis not present

## 2020-05-01 DIAGNOSIS — H353231 Exudative age-related macular degeneration, bilateral, with active choroidal neovascularization: Secondary | ICD-10-CM | POA: Diagnosis not present

## 2020-05-12 DIAGNOSIS — E1139 Type 2 diabetes mellitus with other diabetic ophthalmic complication: Secondary | ICD-10-CM | POA: Diagnosis not present

## 2020-05-12 DIAGNOSIS — M1990 Unspecified osteoarthritis, unspecified site: Secondary | ICD-10-CM | POA: Diagnosis not present

## 2020-05-12 DIAGNOSIS — Z79899 Other long term (current) drug therapy: Secondary | ICD-10-CM | POA: Diagnosis not present

## 2020-05-12 DIAGNOSIS — I480 Paroxysmal atrial fibrillation: Secondary | ICD-10-CM | POA: Diagnosis not present

## 2020-05-23 DIAGNOSIS — I10 Essential (primary) hypertension: Secondary | ICD-10-CM | POA: Diagnosis not present

## 2020-05-23 DIAGNOSIS — I482 Chronic atrial fibrillation, unspecified: Secondary | ICD-10-CM | POA: Diagnosis not present

## 2020-05-23 DIAGNOSIS — E1139 Type 2 diabetes mellitus with other diabetic ophthalmic complication: Secondary | ICD-10-CM | POA: Diagnosis not present

## 2020-05-23 DIAGNOSIS — R7309 Other abnormal glucose: Secondary | ICD-10-CM | POA: Diagnosis not present

## 2020-05-24 DIAGNOSIS — E1139 Type 2 diabetes mellitus with other diabetic ophthalmic complication: Secondary | ICD-10-CM | POA: Diagnosis not present

## 2020-06-23 DIAGNOSIS — E1139 Type 2 diabetes mellitus with other diabetic ophthalmic complication: Secondary | ICD-10-CM | POA: Diagnosis not present

## 2020-06-26 DIAGNOSIS — H353231 Exudative age-related macular degeneration, bilateral, with active choroidal neovascularization: Secondary | ICD-10-CM | POA: Diagnosis not present

## 2020-06-26 DIAGNOSIS — Z961 Presence of intraocular lens: Secondary | ICD-10-CM | POA: Diagnosis not present

## 2020-07-14 DIAGNOSIS — H401134 Primary open-angle glaucoma, bilateral, indeterminate stage: Secondary | ICD-10-CM | POA: Diagnosis not present

## 2020-07-22 DIAGNOSIS — M79641 Pain in right hand: Secondary | ICD-10-CM | POA: Diagnosis not present

## 2020-07-22 DIAGNOSIS — M79605 Pain in left leg: Secondary | ICD-10-CM | POA: Diagnosis not present

## 2020-07-22 DIAGNOSIS — I10 Essential (primary) hypertension: Secondary | ICD-10-CM | POA: Diagnosis not present

## 2020-07-22 DIAGNOSIS — M65352 Trigger finger, left little finger: Secondary | ICD-10-CM | POA: Diagnosis not present

## 2020-07-23 DIAGNOSIS — E1139 Type 2 diabetes mellitus with other diabetic ophthalmic complication: Secondary | ICD-10-CM | POA: Diagnosis not present

## 2020-08-15 ENCOUNTER — Other Ambulatory Visit: Payer: Self-pay | Admitting: Internal Medicine

## 2020-08-21 DIAGNOSIS — Z961 Presence of intraocular lens: Secondary | ICD-10-CM | POA: Diagnosis not present

## 2020-08-21 DIAGNOSIS — H353231 Exudative age-related macular degeneration, bilateral, with active choroidal neovascularization: Secondary | ICD-10-CM | POA: Diagnosis not present

## 2020-08-28 ENCOUNTER — Other Ambulatory Visit: Payer: Self-pay | Admitting: Internal Medicine

## 2020-08-28 DIAGNOSIS — Z1231 Encounter for screening mammogram for malignant neoplasm of breast: Secondary | ICD-10-CM

## 2020-08-30 DIAGNOSIS — E1139 Type 2 diabetes mellitus with other diabetic ophthalmic complication: Secondary | ICD-10-CM | POA: Diagnosis not present

## 2020-09-02 DIAGNOSIS — M79605 Pain in left leg: Secondary | ICD-10-CM | POA: Diagnosis not present

## 2020-09-02 DIAGNOSIS — K219 Gastro-esophageal reflux disease without esophagitis: Secondary | ICD-10-CM | POA: Diagnosis not present

## 2020-09-02 DIAGNOSIS — R7309 Other abnormal glucose: Secondary | ICD-10-CM | POA: Diagnosis not present

## 2020-09-02 DIAGNOSIS — E1122 Type 2 diabetes mellitus with diabetic chronic kidney disease: Secondary | ICD-10-CM | POA: Diagnosis not present

## 2020-09-11 ENCOUNTER — Encounter (INDEPENDENT_AMBULATORY_CARE_PROVIDER_SITE_OTHER): Payer: Self-pay | Admitting: *Deleted

## 2020-09-26 ENCOUNTER — Ambulatory Visit: Payer: Medicare Other

## 2020-09-29 DIAGNOSIS — E1139 Type 2 diabetes mellitus with other diabetic ophthalmic complication: Secondary | ICD-10-CM | POA: Diagnosis not present

## 2020-10-27 ENCOUNTER — Ambulatory Visit
Admission: RE | Admit: 2020-10-27 | Discharge: 2020-10-27 | Disposition: A | Discharge status: Home / Self Care | Payer: Medicare Other | Source: Ambulatory Visit | Attending: Internal Medicine | Admitting: Internal Medicine

## 2020-10-27 ENCOUNTER — Other Ambulatory Visit: Payer: Self-pay

## 2020-10-27 DIAGNOSIS — Z1231 Encounter for screening mammogram for malignant neoplasm of breast: Secondary | ICD-10-CM | POA: Diagnosis not present

## 2020-10-29 DIAGNOSIS — E1139 Type 2 diabetes mellitus with other diabetic ophthalmic complication: Secondary | ICD-10-CM | POA: Diagnosis not present

## 2020-10-30 DIAGNOSIS — Z961 Presence of intraocular lens: Secondary | ICD-10-CM | POA: Diagnosis not present

## 2020-10-30 DIAGNOSIS — H353231 Exudative age-related macular degeneration, bilateral, with active choroidal neovascularization: Secondary | ICD-10-CM | POA: Diagnosis not present

## 2020-11-20 DIAGNOSIS — I10 Essential (primary) hypertension: Secondary | ICD-10-CM | POA: Diagnosis not present

## 2020-11-20 DIAGNOSIS — E785 Hyperlipidemia, unspecified: Secondary | ICD-10-CM | POA: Diagnosis not present

## 2020-11-26 DIAGNOSIS — I10 Essential (primary) hypertension: Secondary | ICD-10-CM | POA: Diagnosis not present

## 2020-11-26 DIAGNOSIS — K219 Gastro-esophageal reflux disease without esophagitis: Secondary | ICD-10-CM | POA: Diagnosis not present

## 2020-11-26 DIAGNOSIS — E1129 Type 2 diabetes mellitus with other diabetic kidney complication: Secondary | ICD-10-CM | POA: Diagnosis not present

## 2020-11-26 DIAGNOSIS — Z79899 Other long term (current) drug therapy: Secondary | ICD-10-CM | POA: Diagnosis not present

## 2020-11-28 ENCOUNTER — Telehealth: Payer: Self-pay | Admitting: Internal Medicine

## 2020-11-28 MED ORDER — FLECAINIDE ACETATE 100 MG PO TABS: 100.0000 mg | ORAL_TABLET | Freq: Two times a day (BID) | ORAL | 1 refills | Status: AC

## 2020-11-28 NOTE — Telephone Encounter (Signed)
*  STAT* If patient is at the pharmacy, call can be transferred to refill team.   1. Which medications need to be refilled? (please list name of each medication and dose if known) new prescription- changing pharmacy- Fleccainide  2. Which pharmacy/location (including street and city if local pharmacy) is medication to be sent to? [facility]  3. Do they need a 30 day or 90 day supply? 90 days and refills

## 2020-12-02 DIAGNOSIS — N1831 Chronic kidney disease, stage 3a: Secondary | ICD-10-CM | POA: Diagnosis not present

## 2020-12-02 DIAGNOSIS — R7309 Other abnormal glucose: Secondary | ICD-10-CM | POA: Diagnosis not present

## 2020-12-02 DIAGNOSIS — E785 Hyperlipidemia, unspecified: Secondary | ICD-10-CM | POA: Diagnosis not present

## 2020-12-02 DIAGNOSIS — E1122 Type 2 diabetes mellitus with diabetic chronic kidney disease: Secondary | ICD-10-CM | POA: Diagnosis not present

## 2020-12-08 DIAGNOSIS — E1139 Type 2 diabetes mellitus with other diabetic ophthalmic complication: Secondary | ICD-10-CM | POA: Diagnosis not present

## 2020-12-15 DIAGNOSIS — M8588 Other specified disorders of bone density and structure, other site: Secondary | ICD-10-CM | POA: Diagnosis not present

## 2020-12-15 DIAGNOSIS — M48061 Spinal stenosis, lumbar region without neurogenic claudication: Secondary | ICD-10-CM | POA: Diagnosis not present

## 2020-12-15 DIAGNOSIS — M47817 Spondylosis without myelopathy or radiculopathy, lumbosacral region: Secondary | ICD-10-CM | POA: Diagnosis not present

## 2020-12-15 DIAGNOSIS — M8008XA Age-related osteoporosis with current pathological fracture, vertebra(e), initial encounter for fracture: Secondary | ICD-10-CM | POA: Diagnosis not present

## 2020-12-15 DIAGNOSIS — M545 Low back pain, unspecified: Secondary | ICD-10-CM | POA: Diagnosis not present

## 2020-12-15 DIAGNOSIS — S32048A Other fracture of fourth lumbar vertebra, initial encounter for closed fracture: Secondary | ICD-10-CM | POA: Diagnosis not present

## 2020-12-15 DIAGNOSIS — S32049A Unspecified fracture of fourth lumbar vertebra, initial encounter for closed fracture: Secondary | ICD-10-CM | POA: Diagnosis not present

## 2020-12-15 DIAGNOSIS — X500XXA Overexertion from strenuous movement or load, initial encounter: Secondary | ICD-10-CM | POA: Diagnosis not present

## 2020-12-16 ENCOUNTER — Ambulatory Visit: Payer: Self-pay | Admitting: *Deleted

## 2020-12-16 NOTE — Telephone Encounter (Signed)
See triage notes for details.

## 2020-12-16 NOTE — Telephone Encounter (Signed)
Daughter calling in for mother.  Barbara [name].   The ambulance took her mother to the ED at [facility].   It used to be [facility] in [place], [place].   "That place is a joke".   "The nurses were playing on their phones and the dr kept talking about what he was going to do when he retires".   "Here's my mother in terrible back pain and no one seemed to care".   They Finally got an x-ray.  The Dr. Michela Pitcher she broke her back.   She was sent to CT. Her Mother was given Toradol prior to going for the CT scan.    She has T4 ,T5 compression fracture.   Her bones are hollow and brittle according to the ED dr.  He sent her home on '5mg'$  hydrocodone.    Today she is still in terrible pain and needs to be at [facility].   The person in the radiology dept at the [facility] even told me last night she needs to be at [facility].    We are in [place].   It's a long ride to [place] with a broken back.    If I call EMS they will take her back to [place].   I don't know what to do.   The ED  gave me a number to call.   I called and they told me they don't treat broken backs there.  "I can't believe the poor care she has received at that hospital".    Daughter spoke with Dr. Willey Blade mother's PCP, Dr. Willey Blade.  He can't over ride EMS and where they will take her.    I'm in [place] and she needs to be transported to [place].    She's in a lot of pain with her back.     She has bad osteoporosis.   She moved a piece of furniture and now has a compression fracture.  I called your line (community line) because I don't know what to do.   She needs to be brought to [facility] but she's in so much pain trying to get her in the car and the ride to [place] from [place] is going to be tough on her.   I don't think she can do it.      I recommended she contact Dr. Willey Blade and see if he could at least order something for pain for the trip to make it easier for her mother.   As far as the EMS system I suggested  she call their main number and see what their protocol is for transporting to another area.    She was agreeable to doing these 2 interventions.  "If I can just get her to [facility] where they have the neurological services I know she would get good care".   "We've always gotten good care from [facility]".     She thanked me very much for letting her talk and listening and for my suggestions.      Reason for Disposition  Health Information question, no triage required and triager able to answer question    Gave her 2 suggestions.    Call mother's PCP for pain medication for the trip to [place],   Call EMS main number and discuss what their protocol is for transporting out of their local area.   [place] to [place], [place]  Answer Assessment - Initial Assessment Questions 1. REASON FOR CALL or QUESTION: "What is your reason  for calling today?" or "How can I best help you?" or "What question do you have that I can help answer?"     See documentation notes. Daughter Barbara [name] called in in regards to her mother.   She was with her mother.    Seeking advice.  Protocols used: Information Only Call - No Triage-A-AH

## 2020-12-17 DIAGNOSIS — M549 Dorsalgia, unspecified: Secondary | ICD-10-CM | POA: Diagnosis not present

## 2020-12-17 DIAGNOSIS — E119 Type 2 diabetes mellitus without complications: Secondary | ICD-10-CM | POA: Diagnosis not present

## 2020-12-17 DIAGNOSIS — M545 Low back pain, unspecified: Secondary | ICD-10-CM | POA: Diagnosis not present

## 2020-12-17 DIAGNOSIS — R102 Pelvic and perineal pain: Secondary | ICD-10-CM | POA: Diagnosis not present

## 2020-12-17 DIAGNOSIS — F419 Anxiety disorder, unspecified: Secondary | ICD-10-CM | POA: Diagnosis not present

## 2020-12-17 DIAGNOSIS — S32048A Other fracture of fourth lumbar vertebra, initial encounter for closed fracture: Secondary | ICD-10-CM | POA: Diagnosis not present

## 2020-12-17 DIAGNOSIS — R9431 Abnormal electrocardiogram [ECG] [EKG]: Secondary | ICD-10-CM | POA: Diagnosis not present

## 2020-12-17 DIAGNOSIS — I959 Hypotension, unspecified: Secondary | ICD-10-CM | POA: Diagnosis not present

## 2020-12-17 DIAGNOSIS — Z20822 Contact with and (suspected) exposure to covid-19: Secondary | ICD-10-CM | POA: Diagnosis not present

## 2020-12-17 DIAGNOSIS — I4891 Unspecified atrial fibrillation: Secondary | ICD-10-CM | POA: Diagnosis not present

## 2020-12-17 DIAGNOSIS — R32 Unspecified urinary incontinence: Secondary | ICD-10-CM | POA: Diagnosis not present

## 2020-12-17 DIAGNOSIS — X500XXS Overexertion from strenuous movement or load, sequela: Secondary | ICD-10-CM | POA: Diagnosis not present

## 2020-12-17 DIAGNOSIS — I10 Essential (primary) hypertension: Secondary | ICD-10-CM | POA: Diagnosis not present

## 2020-12-17 DIAGNOSIS — R200 Anesthesia of skin: Secondary | ICD-10-CM | POA: Diagnosis not present

## 2020-12-17 DIAGNOSIS — Z794 Long term (current) use of insulin: Secondary | ICD-10-CM | POA: Diagnosis not present

## 2020-12-17 DIAGNOSIS — E1165 Type 2 diabetes mellitus with hyperglycemia: Secondary | ICD-10-CM | POA: Diagnosis not present

## 2020-12-17 DIAGNOSIS — R2689 Other abnormalities of gait and mobility: Secondary | ICD-10-CM | POA: Diagnosis not present

## 2020-12-17 DIAGNOSIS — S32040S Wedge compression fracture of fourth lumbar vertebra, sequela: Secondary | ICD-10-CM | POA: Diagnosis not present

## 2020-12-17 DIAGNOSIS — S32040A Wedge compression fracture of fourth lumbar vertebra, initial encounter for closed fracture: Secondary | ICD-10-CM | POA: Diagnosis not present

## 2020-12-17 DIAGNOSIS — M4807 Spinal stenosis, lumbosacral region: Secondary | ICD-10-CM | POA: Diagnosis not present

## 2020-12-17 DIAGNOSIS — F17200 Nicotine dependence, unspecified, uncomplicated: Secondary | ICD-10-CM | POA: Diagnosis not present

## 2020-12-17 DIAGNOSIS — M48061 Spinal stenosis, lumbar region without neurogenic claudication: Secondary | ICD-10-CM | POA: Diagnosis not present

## 2020-12-17 DIAGNOSIS — Z79899 Other long term (current) drug therapy: Secondary | ICD-10-CM | POA: Diagnosis not present

## 2020-12-17 DIAGNOSIS — I700 Atherosclerosis of aorta: Secondary | ICD-10-CM | POA: Diagnosis not present

## 2020-12-17 DIAGNOSIS — Z7982 Long term (current) use of aspirin: Secondary | ICD-10-CM | POA: Diagnosis not present

## 2020-12-17 DIAGNOSIS — S32048S Other fracture of fourth lumbar vertebra, sequela: Secondary | ICD-10-CM | POA: Diagnosis not present

## 2020-12-17 DIAGNOSIS — F17210 Nicotine dependence, cigarettes, uncomplicated: Secondary | ICD-10-CM | POA: Diagnosis not present

## 2020-12-17 DIAGNOSIS — M8008XA Age-related osteoporosis with current pathological fracture, vertebra(e), initial encounter for fracture: Secondary | ICD-10-CM | POA: Diagnosis not present

## 2020-12-18 DIAGNOSIS — I4891 Unspecified atrial fibrillation: Secondary | ICD-10-CM | POA: Diagnosis not present

## 2020-12-18 DIAGNOSIS — F419 Anxiety disorder, unspecified: Secondary | ICD-10-CM | POA: Diagnosis not present

## 2020-12-18 DIAGNOSIS — R52 Pain, unspecified: Secondary | ICD-10-CM | POA: Diagnosis not present

## 2020-12-18 DIAGNOSIS — R2689 Other abnormalities of gait and mobility: Secondary | ICD-10-CM | POA: Diagnosis not present

## 2020-12-18 DIAGNOSIS — I10 Essential (primary) hypertension: Secondary | ICD-10-CM | POA: Diagnosis not present

## 2020-12-18 DIAGNOSIS — M48061 Spinal stenosis, lumbar region without neurogenic claudication: Secondary | ICD-10-CM | POA: Diagnosis not present

## 2020-12-18 DIAGNOSIS — M545 Low back pain, unspecified: Secondary | ICD-10-CM | POA: Diagnosis not present

## 2020-12-18 DIAGNOSIS — Z7982 Long term (current) use of aspirin: Secondary | ICD-10-CM | POA: Diagnosis not present

## 2020-12-18 DIAGNOSIS — Z794 Long term (current) use of insulin: Secondary | ICD-10-CM | POA: Diagnosis not present

## 2020-12-18 DIAGNOSIS — Z743 Need for continuous supervision: Secondary | ICD-10-CM | POA: Diagnosis not present

## 2020-12-18 DIAGNOSIS — R200 Anesthesia of skin: Secondary | ICD-10-CM | POA: Diagnosis not present

## 2020-12-18 DIAGNOSIS — E1165 Type 2 diabetes mellitus with hyperglycemia: Secondary | ICD-10-CM | POA: Diagnosis not present

## 2020-12-18 DIAGNOSIS — S32048A Other fracture of fourth lumbar vertebra, initial encounter for closed fracture: Secondary | ICD-10-CM | POA: Diagnosis not present

## 2020-12-18 DIAGNOSIS — F17210 Nicotine dependence, cigarettes, uncomplicated: Secondary | ICD-10-CM | POA: Diagnosis not present

## 2020-12-18 DIAGNOSIS — M6250 Muscle wasting and atrophy, not elsewhere classified, unspecified site: Secondary | ICD-10-CM | POA: Diagnosis not present

## 2020-12-18 DIAGNOSIS — Z79899 Other long term (current) drug therapy: Secondary | ICD-10-CM | POA: Diagnosis not present

## 2020-12-18 DIAGNOSIS — M8008XA Age-related osteoporosis with current pathological fracture, vertebra(e), initial encounter for fracture: Secondary | ICD-10-CM | POA: Diagnosis not present

## 2020-12-19 DIAGNOSIS — S32011A Stable burst fracture of first lumbar vertebra, initial encounter for closed fracture: Secondary | ICD-10-CM | POA: Diagnosis not present

## 2020-12-23 ENCOUNTER — Telehealth: Payer: Self-pay | Admitting: Radiology

## 2020-12-23 NOTE — Telephone Encounter (Signed)
Daughter called, and stated patient has an L4 fracture and Dr Ria Comment office is going to refer her to Dr Aline Brochure.  Patient was seen in [facility] ED and evaluated/xrayed.  I have advised daughter there is nothing we can do on an orthopedic level, that maybe she can call Dr Ria Comment office and ask for a referral to [facility] who was on call when seen in ED.  Or an urgent referral to [facility] in [place].  Daughter understands and will call Dr Ria Comment office back.

## 2020-12-26 DIAGNOSIS — Z4682 Encounter for fitting and adjustment of non-vascular catheter: Secondary | ICD-10-CM | POA: Diagnosis not present

## 2020-12-26 DIAGNOSIS — F17200 Nicotine dependence, unspecified, uncomplicated: Secondary | ICD-10-CM | POA: Diagnosis not present

## 2020-12-26 DIAGNOSIS — Z452 Encounter for adjustment and management of vascular access device: Secondary | ICD-10-CM | POA: Diagnosis not present

## 2020-12-26 DIAGNOSIS — Z20822 Contact with and (suspected) exposure to covid-19: Secondary | ICD-10-CM | POA: Diagnosis not present

## 2020-12-26 DIAGNOSIS — K922 Gastrointestinal hemorrhage, unspecified: Secondary | ICD-10-CM | POA: Diagnosis not present

## 2020-12-26 DIAGNOSIS — R9082 White matter disease, unspecified: Secondary | ICD-10-CM | POA: Diagnosis not present

## 2020-12-26 DIAGNOSIS — G319 Degenerative disease of nervous system, unspecified: Secondary | ICD-10-CM | POA: Diagnosis not present

## 2020-12-26 DIAGNOSIS — J90 Pleural effusion, not elsewhere classified: Secondary | ICD-10-CM | POA: Diagnosis not present

## 2020-12-26 DIAGNOSIS — A419 Sepsis, unspecified organism: Secondary | ICD-10-CM | POA: Diagnosis not present

## 2020-12-26 DIAGNOSIS — I480 Paroxysmal atrial fibrillation: Secondary | ICD-10-CM | POA: Diagnosis not present

## 2020-12-26 DIAGNOSIS — R739 Hyperglycemia, unspecified: Secondary | ICD-10-CM | POA: Diagnosis not present

## 2020-12-26 DIAGNOSIS — R4182 Altered mental status, unspecified: Secondary | ICD-10-CM | POA: Diagnosis not present

## 2020-12-26 DIAGNOSIS — R404 Transient alteration of awareness: Secondary | ICD-10-CM | POA: Diagnosis not present

## 2020-12-26 DIAGNOSIS — R1110 Vomiting, unspecified: Secondary | ICD-10-CM | POA: Diagnosis not present

## 2020-12-26 DIAGNOSIS — J449 Chronic obstructive pulmonary disease, unspecified: Secondary | ICD-10-CM | POA: Diagnosis not present

## 2020-12-26 DIAGNOSIS — N179 Acute kidney failure, unspecified: Secondary | ICD-10-CM | POA: Diagnosis not present

## 2020-12-26 DIAGNOSIS — I700 Atherosclerosis of aorta: Secondary | ICD-10-CM | POA: Diagnosis not present

## 2020-12-26 DIAGNOSIS — I2510 Atherosclerotic heart disease of native coronary artery without angina pectoris: Secondary | ICD-10-CM | POA: Diagnosis not present

## 2020-12-26 DIAGNOSIS — I471 Supraventricular tachycardia: Secondary | ICD-10-CM | POA: Diagnosis not present

## 2020-12-26 DIAGNOSIS — R110 Nausea: Secondary | ICD-10-CM | POA: Diagnosis not present

## 2020-12-26 DIAGNOSIS — M4856XA Collapsed vertebra, not elsewhere classified, lumbar region, initial encounter for fracture: Secondary | ICD-10-CM | POA: Diagnosis not present

## 2020-12-26 DIAGNOSIS — K8020 Calculus of gallbladder without cholecystitis without obstruction: Secondary | ICD-10-CM | POA: Diagnosis not present

## 2020-12-26 DIAGNOSIS — R4020 Unspecified coma: Secondary | ICD-10-CM | POA: Diagnosis not present

## 2020-12-26 DIAGNOSIS — R778 Other specified abnormalities of plasma proteins: Secondary | ICD-10-CM | POA: Diagnosis not present

## 2020-12-26 DIAGNOSIS — R109 Unspecified abdominal pain: Secondary | ICD-10-CM | POA: Diagnosis not present

## 2020-12-26 DIAGNOSIS — Z794 Long term (current) use of insulin: Secondary | ICD-10-CM | POA: Diagnosis not present

## 2020-12-26 DIAGNOSIS — E1110 Type 2 diabetes mellitus with ketoacidosis without coma: Secondary | ICD-10-CM | POA: Diagnosis not present

## 2020-12-28 DIAGNOSIS — F32A Depression, unspecified: Secondary | ICD-10-CM | POA: Diagnosis not present

## 2020-12-28 DIAGNOSIS — B3781 Candidal esophagitis: Secondary | ICD-10-CM | POA: Diagnosis not present

## 2020-12-28 DIAGNOSIS — E1110 Type 2 diabetes mellitus with ketoacidosis without coma: Secondary | ICD-10-CM | POA: Diagnosis not present

## 2020-12-28 DIAGNOSIS — E1111 Type 2 diabetes mellitus with ketoacidosis with coma: Secondary | ICD-10-CM | POA: Diagnosis not present

## 2020-12-28 DIAGNOSIS — I700 Atherosclerosis of aorta: Secondary | ICD-10-CM | POA: Diagnosis not present

## 2020-12-28 DIAGNOSIS — K259 Gastric ulcer, unspecified as acute or chronic, without hemorrhage or perforation: Secondary | ICD-10-CM | POA: Diagnosis not present

## 2020-12-28 DIAGNOSIS — S32040A Wedge compression fracture of fourth lumbar vertebra, initial encounter for closed fracture: Secondary | ICD-10-CM | POA: Diagnosis not present

## 2020-12-28 DIAGNOSIS — Z4682 Encounter for fitting and adjustment of non-vascular catheter: Secondary | ICD-10-CM | POA: Diagnosis not present

## 2020-12-28 DIAGNOSIS — N179 Acute kidney failure, unspecified: Secondary | ICD-10-CM | POA: Diagnosis not present

## 2020-12-28 DIAGNOSIS — Z794 Long term (current) use of insulin: Secondary | ICD-10-CM | POA: Diagnosis not present

## 2020-12-28 DIAGNOSIS — R778 Other specified abnormalities of plasma proteins: Secondary | ICD-10-CM | POA: Diagnosis not present

## 2020-12-28 DIAGNOSIS — R29818 Other symptoms and signs involving the nervous system: Secondary | ICD-10-CM | POA: Diagnosis not present

## 2020-12-28 DIAGNOSIS — D509 Iron deficiency anemia, unspecified: Secondary | ICD-10-CM | POA: Diagnosis not present

## 2020-12-28 DIAGNOSIS — R279 Unspecified lack of coordination: Secondary | ICD-10-CM | POA: Diagnosis not present

## 2020-12-28 DIAGNOSIS — J9601 Acute respiratory failure with hypoxia: Secondary | ICD-10-CM | POA: Diagnosis not present

## 2020-12-28 DIAGNOSIS — Z66 Do not resuscitate: Secondary | ICD-10-CM | POA: Diagnosis not present

## 2020-12-28 DIAGNOSIS — J449 Chronic obstructive pulmonary disease, unspecified: Secondary | ICD-10-CM | POA: Diagnosis not present

## 2020-12-28 DIAGNOSIS — M4856XA Collapsed vertebra, not elsewhere classified, lumbar region, initial encounter for fracture: Secondary | ICD-10-CM | POA: Diagnosis not present

## 2020-12-28 DIAGNOSIS — J90 Pleural effusion, not elsewhere classified: Secondary | ICD-10-CM | POA: Diagnosis not present

## 2020-12-28 DIAGNOSIS — M5117 Intervertebral disc disorders with radiculopathy, lumbosacral region: Secondary | ICD-10-CM | POA: Diagnosis not present

## 2020-12-28 DIAGNOSIS — E222 Syndrome of inappropriate secretion of antidiuretic hormone: Secondary | ICD-10-CM | POA: Diagnosis not present

## 2020-12-28 DIAGNOSIS — J984 Other disorders of lung: Secondary | ICD-10-CM | POA: Diagnosis not present

## 2020-12-28 DIAGNOSIS — K8020 Calculus of gallbladder without cholecystitis without obstruction: Secondary | ICD-10-CM | POA: Diagnosis not present

## 2020-12-28 DIAGNOSIS — E1122 Type 2 diabetes mellitus with diabetic chronic kidney disease: Secondary | ICD-10-CM | POA: Diagnosis not present

## 2020-12-28 DIAGNOSIS — Z4659 Encounter for fitting and adjustment of other gastrointestinal appliance and device: Secondary | ICD-10-CM | POA: Diagnosis not present

## 2020-12-28 DIAGNOSIS — G319 Degenerative disease of nervous system, unspecified: Secondary | ICD-10-CM | POA: Diagnosis not present

## 2020-12-28 DIAGNOSIS — I480 Paroxysmal atrial fibrillation: Secondary | ICD-10-CM | POA: Diagnosis not present

## 2020-12-28 DIAGNOSIS — J9602 Acute respiratory failure with hypercapnia: Secondary | ICD-10-CM | POA: Diagnosis not present

## 2020-12-28 DIAGNOSIS — N181 Chronic kidney disease, stage 1: Secondary | ICD-10-CM | POA: Diagnosis not present

## 2020-12-28 DIAGNOSIS — G9341 Metabolic encephalopathy: Secondary | ICD-10-CM | POA: Diagnosis not present

## 2020-12-28 DIAGNOSIS — M6281 Muscle weakness (generalized): Secondary | ICD-10-CM | POA: Diagnosis not present

## 2020-12-28 DIAGNOSIS — K209 Esophagitis, unspecified without bleeding: Secondary | ICD-10-CM | POA: Diagnosis not present

## 2020-12-28 DIAGNOSIS — E119 Type 2 diabetes mellitus without complications: Secondary | ICD-10-CM | POA: Diagnosis not present

## 2020-12-28 DIAGNOSIS — F17200 Nicotine dependence, unspecified, uncomplicated: Secondary | ICD-10-CM | POA: Diagnosis not present

## 2020-12-28 DIAGNOSIS — E11649 Type 2 diabetes mellitus with hypoglycemia without coma: Secondary | ICD-10-CM | POA: Diagnosis not present

## 2020-12-28 DIAGNOSIS — R918 Other nonspecific abnormal finding of lung field: Secondary | ICD-10-CM | POA: Diagnosis not present

## 2020-12-28 DIAGNOSIS — G928 Other toxic encephalopathy: Secondary | ICD-10-CM | POA: Diagnosis not present

## 2020-12-28 DIAGNOSIS — I2510 Atherosclerotic heart disease of native coronary artery without angina pectoris: Secondary | ICD-10-CM | POA: Diagnosis not present

## 2020-12-28 DIAGNOSIS — K229 Disease of esophagus, unspecified: Secondary | ICD-10-CM | POA: Diagnosis not present

## 2020-12-28 DIAGNOSIS — I10 Essential (primary) hypertension: Secondary | ICD-10-CM | POA: Diagnosis not present

## 2020-12-28 DIAGNOSIS — M48061 Spinal stenosis, lumbar region without neurogenic claudication: Secondary | ICD-10-CM | POA: Diagnosis not present

## 2020-12-28 DIAGNOSIS — K922 Gastrointestinal hemorrhage, unspecified: Secondary | ICD-10-CM | POA: Diagnosis not present

## 2020-12-28 DIAGNOSIS — R579 Shock, unspecified: Secondary | ICD-10-CM | POA: Diagnosis not present

## 2020-12-28 DIAGNOSIS — D62 Acute posthemorrhagic anemia: Secondary | ICD-10-CM | POA: Diagnosis not present

## 2020-12-28 DIAGNOSIS — I4891 Unspecified atrial fibrillation: Secondary | ICD-10-CM | POA: Diagnosis not present

## 2020-12-28 DIAGNOSIS — K2950 Unspecified chronic gastritis without bleeding: Secondary | ICD-10-CM | POA: Diagnosis not present

## 2020-12-28 DIAGNOSIS — J45909 Unspecified asthma, uncomplicated: Secondary | ICD-10-CM | POA: Diagnosis not present

## 2020-12-28 DIAGNOSIS — K254 Chronic or unspecified gastric ulcer with hemorrhage: Secondary | ICD-10-CM | POA: Diagnosis not present

## 2020-12-28 DIAGNOSIS — A419 Sepsis, unspecified organism: Secondary | ICD-10-CM | POA: Diagnosis not present

## 2020-12-28 DIAGNOSIS — Z9911 Dependence on respirator [ventilator] status: Secondary | ICD-10-CM | POA: Diagnosis not present

## 2020-12-28 DIAGNOSIS — S32049A Unspecified fracture of fourth lumbar vertebra, initial encounter for closed fracture: Secondary | ICD-10-CM | POA: Diagnosis not present

## 2020-12-28 DIAGNOSIS — Y33XXXA Other specified events, undetermined intent, initial encounter: Secondary | ICD-10-CM | POA: Diagnosis not present

## 2020-12-28 DIAGNOSIS — R9082 White matter disease, unspecified: Secondary | ICD-10-CM | POA: Diagnosis not present

## 2020-12-28 DIAGNOSIS — S32040D Wedge compression fracture of fourth lumbar vertebra, subsequent encounter for fracture with routine healing: Secondary | ICD-10-CM | POA: Diagnosis not present

## 2020-12-28 DIAGNOSIS — E1140 Type 2 diabetes mellitus with diabetic neuropathy, unspecified: Secondary | ICD-10-CM | POA: Diagnosis not present

## 2020-12-28 DIAGNOSIS — Z20822 Contact with and (suspected) exposure to covid-19: Secondary | ICD-10-CM | POA: Diagnosis not present

## 2020-12-28 DIAGNOSIS — R5381 Other malaise: Secondary | ICD-10-CM | POA: Diagnosis not present

## 2020-12-28 DIAGNOSIS — R2689 Other abnormalities of gait and mobility: Secondary | ICD-10-CM | POA: Diagnosis not present

## 2020-12-28 DIAGNOSIS — E1139 Type 2 diabetes mellitus with other diabetic ophthalmic complication: Secondary | ICD-10-CM | POA: Diagnosis not present

## 2021-01-08 ENCOUNTER — Ambulatory Visit (INDEPENDENT_AMBULATORY_CARE_PROVIDER_SITE_OTHER): Payer: Medicare Other | Admitting: Gastroenterology

## 2021-01-13 DIAGNOSIS — R5381 Other malaise: Secondary | ICD-10-CM | POA: Diagnosis not present

## 2021-01-13 DIAGNOSIS — K5909 Other constipation: Secondary | ICD-10-CM | POA: Diagnosis not present

## 2021-01-13 DIAGNOSIS — M6281 Muscle weakness (generalized): Secondary | ICD-10-CM | POA: Diagnosis not present

## 2021-01-13 DIAGNOSIS — E118 Type 2 diabetes mellitus with unspecified complications: Secondary | ICD-10-CM | POA: Diagnosis not present

## 2021-01-13 DIAGNOSIS — S32040A Wedge compression fracture of fourth lumbar vertebra, initial encounter for closed fracture: Secondary | ICD-10-CM | POA: Diagnosis not present

## 2021-01-13 DIAGNOSIS — I4891 Unspecified atrial fibrillation: Secondary | ICD-10-CM | POA: Diagnosis not present

## 2021-01-13 DIAGNOSIS — J449 Chronic obstructive pulmonary disease, unspecified: Secondary | ICD-10-CM | POA: Diagnosis not present

## 2021-01-13 DIAGNOSIS — J9601 Acute respiratory failure with hypoxia: Secondary | ICD-10-CM | POA: Diagnosis not present

## 2021-01-13 DIAGNOSIS — K922 Gastrointestinal hemorrhage, unspecified: Secondary | ICD-10-CM | POA: Diagnosis not present

## 2021-01-13 DIAGNOSIS — D509 Iron deficiency anemia, unspecified: Secondary | ICD-10-CM | POA: Diagnosis not present

## 2021-01-13 DIAGNOSIS — S32040D Wedge compression fracture of fourth lumbar vertebra, subsequent encounter for fracture with routine healing: Secondary | ICD-10-CM | POA: Diagnosis not present

## 2021-01-13 DIAGNOSIS — B3781 Candidal esophagitis: Secondary | ICD-10-CM | POA: Diagnosis not present

## 2021-01-13 DIAGNOSIS — R2689 Other abnormalities of gait and mobility: Secondary | ICD-10-CM | POA: Diagnosis not present

## 2021-01-13 DIAGNOSIS — Z794 Long term (current) use of insulin: Secondary | ICD-10-CM | POA: Diagnosis not present

## 2021-01-13 DIAGNOSIS — E1140 Type 2 diabetes mellitus with diabetic neuropathy, unspecified: Secondary | ICD-10-CM | POA: Diagnosis not present

## 2021-01-13 DIAGNOSIS — R279 Unspecified lack of coordination: Secondary | ICD-10-CM | POA: Diagnosis not present

## 2021-01-13 DIAGNOSIS — E871 Hypo-osmolality and hyponatremia: Secondary | ICD-10-CM | POA: Diagnosis not present

## 2021-01-14 DIAGNOSIS — K5909 Other constipation: Secondary | ICD-10-CM | POA: Diagnosis not present

## 2021-01-14 DIAGNOSIS — J449 Chronic obstructive pulmonary disease, unspecified: Secondary | ICD-10-CM | POA: Diagnosis not present

## 2021-01-14 DIAGNOSIS — E118 Type 2 diabetes mellitus with unspecified complications: Secondary | ICD-10-CM | POA: Diagnosis not present

## 2021-01-14 DIAGNOSIS — E871 Hypo-osmolality and hyponatremia: Secondary | ICD-10-CM | POA: Diagnosis not present

## 2021-01-22 DIAGNOSIS — I480 Paroxysmal atrial fibrillation: Secondary | ICD-10-CM | POA: Diagnosis not present

## 2021-01-22 DIAGNOSIS — A419 Sepsis, unspecified organism: Secondary | ICD-10-CM | POA: Diagnosis not present

## 2021-01-22 DIAGNOSIS — K259 Gastric ulcer, unspecified as acute or chronic, without hemorrhage or perforation: Secondary | ICD-10-CM | POA: Diagnosis not present

## 2021-01-22 DIAGNOSIS — E1110 Type 2 diabetes mellitus with ketoacidosis without coma: Secondary | ICD-10-CM | POA: Diagnosis not present

## 2021-01-29 DIAGNOSIS — E43 Unspecified severe protein-calorie malnutrition: Secondary | ICD-10-CM | POA: Diagnosis not present

## 2021-01-29 DIAGNOSIS — D649 Anemia, unspecified: Secondary | ICD-10-CM | POA: Diagnosis not present

## 2021-01-29 DIAGNOSIS — Z79891 Long term (current) use of opiate analgesic: Secondary | ICD-10-CM | POA: Diagnosis not present

## 2021-01-29 DIAGNOSIS — K7460 Unspecified cirrhosis of liver: Secondary | ICD-10-CM | POA: Diagnosis not present

## 2021-01-29 DIAGNOSIS — I480 Paroxysmal atrial fibrillation: Secondary | ICD-10-CM | POA: Diagnosis not present

## 2021-01-29 DIAGNOSIS — E11621 Type 2 diabetes mellitus with foot ulcer: Secondary | ICD-10-CM | POA: Diagnosis not present

## 2021-01-29 DIAGNOSIS — N390 Urinary tract infection, site not specified: Secondary | ICD-10-CM | POA: Diagnosis not present

## 2021-01-29 DIAGNOSIS — K259 Gastric ulcer, unspecified as acute or chronic, without hemorrhage or perforation: Secondary | ICD-10-CM | POA: Diagnosis not present

## 2021-02-06 DIAGNOSIS — E1139 Type 2 diabetes mellitus with other diabetic ophthalmic complication: Secondary | ICD-10-CM | POA: Diagnosis not present

## 2021-02-16 ENCOUNTER — Ambulatory Visit (INDEPENDENT_AMBULATORY_CARE_PROVIDER_SITE_OTHER): Payer: Medicare Other | Admitting: Gastroenterology

## 2021-02-26 DIAGNOSIS — D649 Anemia, unspecified: Secondary | ICD-10-CM | POA: Diagnosis not present

## 2021-02-26 DIAGNOSIS — F0390 Unspecified dementia without behavioral disturbance: Secondary | ICD-10-CM | POA: Diagnosis not present

## 2021-02-26 DIAGNOSIS — I480 Paroxysmal atrial fibrillation: Secondary | ICD-10-CM | POA: Diagnosis not present

## 2021-02-26 DIAGNOSIS — R609 Edema, unspecified: Secondary | ICD-10-CM | POA: Diagnosis not present

## 2021-03-03 DIAGNOSIS — L89892 Pressure ulcer of other site, stage 2: Secondary | ICD-10-CM | POA: Diagnosis not present

## 2021-03-03 DIAGNOSIS — M79674 Pain in right toe(s): Secondary | ICD-10-CM | POA: Diagnosis not present

## 2021-03-03 DIAGNOSIS — M79671 Pain in right foot: Secondary | ICD-10-CM | POA: Diagnosis not present

## 2021-03-03 DIAGNOSIS — M79672 Pain in left foot: Secondary | ICD-10-CM | POA: Diagnosis not present

## 2021-03-10 DIAGNOSIS — E1140 Type 2 diabetes mellitus with diabetic neuropathy, unspecified: Secondary | ICD-10-CM | POA: Diagnosis not present

## 2021-03-10 DIAGNOSIS — N390 Urinary tract infection, site not specified: Secondary | ICD-10-CM | POA: Diagnosis not present

## 2021-03-10 DIAGNOSIS — I4891 Unspecified atrial fibrillation: Secondary | ICD-10-CM | POA: Diagnosis not present

## 2021-03-10 DIAGNOSIS — I2510 Atherosclerotic heart disease of native coronary artery without angina pectoris: Secondary | ICD-10-CM | POA: Diagnosis not present

## 2021-03-12 DIAGNOSIS — Z961 Presence of intraocular lens: Secondary | ICD-10-CM | POA: Diagnosis not present

## 2021-03-12 DIAGNOSIS — H353231 Exudative age-related macular degeneration, bilateral, with active choroidal neovascularization: Secondary | ICD-10-CM | POA: Diagnosis not present

## 2021-03-17 ENCOUNTER — Other Ambulatory Visit (HOSPITAL_COMMUNITY): Payer: Self-pay | Admitting: Internal Medicine

## 2021-03-17 DIAGNOSIS — N390 Urinary tract infection, site not specified: Secondary | ICD-10-CM | POA: Diagnosis not present

## 2021-03-17 DIAGNOSIS — R609 Edema, unspecified: Secondary | ICD-10-CM

## 2021-03-17 DIAGNOSIS — F0390 Unspecified dementia without behavioral disturbance: Secondary | ICD-10-CM | POA: Diagnosis not present

## 2021-03-17 DIAGNOSIS — I739 Peripheral vascular disease, unspecified: Secondary | ICD-10-CM | POA: Diagnosis not present

## 2021-03-17 DIAGNOSIS — E1139 Type 2 diabetes mellitus with other diabetic ophthalmic complication: Secondary | ICD-10-CM | POA: Diagnosis not present

## 2021-03-19 ENCOUNTER — Encounter (HOSPITAL_COMMUNITY): Payer: Self-pay

## 2021-03-19 ENCOUNTER — Ambulatory Visit (HOSPITAL_COMMUNITY): Admission: RE | Admit: 2021-03-19 | Payer: Medicare Other | Source: Ambulatory Visit

## 2021-03-24 DIAGNOSIS — L89892 Pressure ulcer of other site, stage 2: Secondary | ICD-10-CM | POA: Diagnosis not present

## 2021-03-24 DIAGNOSIS — M79672 Pain in left foot: Secondary | ICD-10-CM | POA: Diagnosis not present

## 2021-03-24 DIAGNOSIS — E1151 Type 2 diabetes mellitus with diabetic peripheral angiopathy without gangrene: Secondary | ICD-10-CM | POA: Diagnosis not present

## 2021-03-24 DIAGNOSIS — M79671 Pain in right foot: Secondary | ICD-10-CM | POA: Diagnosis not present

## 2021-03-26 ENCOUNTER — Encounter (HOSPITAL_COMMUNITY): Payer: Self-pay | Admitting: *Deleted

## 2021-03-26 ENCOUNTER — Emergency Department (HOSPITAL_COMMUNITY): Payer: Medicare Other

## 2021-03-26 ENCOUNTER — Emergency Department (HOSPITAL_COMMUNITY)
Admission: EM | Admit: 2021-03-26 | Discharge: 2021-03-27 | Disposition: A | Discharge status: Home / Self Care | Payer: Medicare Other | Attending: Emergency Medicine | Admitting: Emergency Medicine

## 2021-03-26 ENCOUNTER — Ambulatory Visit (HOSPITAL_COMMUNITY): Admission: RE | Admit: 2021-03-26 | Payer: Medicare Other | Source: Ambulatory Visit

## 2021-03-26 DIAGNOSIS — W1839XA Other fall on same level, initial encounter: Secondary | ICD-10-CM | POA: Insufficient documentation

## 2021-03-26 DIAGNOSIS — F17210 Nicotine dependence, cigarettes, uncomplicated: Secondary | ICD-10-CM | POA: Diagnosis not present

## 2021-03-26 DIAGNOSIS — S32020A Wedge compression fracture of second lumbar vertebra, initial encounter for closed fracture: Secondary | ICD-10-CM | POA: Insufficient documentation

## 2021-03-26 DIAGNOSIS — M545 Low back pain, unspecified: Secondary | ICD-10-CM | POA: Diagnosis not present

## 2021-03-26 DIAGNOSIS — E1169 Type 2 diabetes mellitus with other specified complication: Secondary | ICD-10-CM | POA: Insufficient documentation

## 2021-03-26 DIAGNOSIS — Z79899 Other long term (current) drug therapy: Secondary | ICD-10-CM | POA: Diagnosis not present

## 2021-03-26 DIAGNOSIS — J45909 Unspecified asthma, uncomplicated: Secondary | ICD-10-CM | POA: Diagnosis not present

## 2021-03-26 DIAGNOSIS — Z794 Long term (current) use of insulin: Secondary | ICD-10-CM | POA: Insufficient documentation

## 2021-03-26 DIAGNOSIS — E785 Hyperlipidemia, unspecified: Secondary | ICD-10-CM | POA: Diagnosis not present

## 2021-03-26 DIAGNOSIS — W19XXXA Unspecified fall, initial encounter: Secondary | ICD-10-CM

## 2021-03-26 DIAGNOSIS — I10 Essential (primary) hypertension: Secondary | ICD-10-CM | POA: Diagnosis not present

## 2021-03-26 DIAGNOSIS — I700 Atherosclerosis of aorta: Secondary | ICD-10-CM | POA: Diagnosis not present

## 2021-03-26 DIAGNOSIS — Z7982 Long term (current) use of aspirin: Secondary | ICD-10-CM | POA: Insufficient documentation

## 2021-03-26 DIAGNOSIS — M47814 Spondylosis without myelopathy or radiculopathy, thoracic region: Secondary | ICD-10-CM | POA: Diagnosis not present

## 2021-03-26 DIAGNOSIS — S34102A Unspecified injury to L2 level of lumbar spinal cord, initial encounter: Secondary | ICD-10-CM | POA: Diagnosis present

## 2021-03-26 DIAGNOSIS — M549 Dorsalgia, unspecified: Secondary | ICD-10-CM | POA: Diagnosis not present

## 2021-03-26 DIAGNOSIS — E1165 Type 2 diabetes mellitus with hyperglycemia: Secondary | ICD-10-CM | POA: Diagnosis not present

## 2021-03-26 DIAGNOSIS — E162 Hypoglycemia, unspecified: Secondary | ICD-10-CM

## 2021-03-26 LAB — COMPREHENSIVE METABOLIC PANEL
ALT: 14 U/L (ref 0–44)
AST: 20 U/L (ref 15–41)
Albumin: 3.5 g/dL (ref 3.5–5.0)
Alkaline Phosphatase: 84 U/L (ref 38–126)
Anion gap: 9 (ref 5–15)
BUN: 16 mg/dL (ref 8–23)
CO2: 26 mmol/L (ref 22–32)
Calcium: 8.9 mg/dL (ref 8.9–10.3)
Chloride: 100 mmol/L (ref 98–111)
Creatinine, Ser: 1.06 mg/dL — ABNORMAL HIGH (ref 0.44–1.00)
GFR, Estimated: 54 mL/min — ABNORMAL LOW (ref >60)
Glucose, Bld: 183 mg/dL — ABNORMAL HIGH (ref 70–99)
Potassium: 5.1 mmol/L (ref 3.5–5.1)
Sodium: 135 mmol/L (ref 135–145)
Total Bilirubin: 0.6 mg/dL (ref 0.3–1.2)
Total Protein: 6.8 g/dL (ref 6.5–8.1)

## 2021-03-26 LAB — CBC WITH DIFFERENTIAL/PLATELET
Abs Immature Granulocytes: 0.05 10*3/uL (ref 0.00–0.07)
Basophils Absolute: 0.1 10*3/uL (ref 0.0–0.1)
Basophils Relative: 1 %
Eosinophils Absolute: 1.5 10*3/uL — ABNORMAL HIGH (ref 0.0–0.5)
Eosinophils Relative: 14 %
HCT: 41.6 % (ref 36.0–46.0)
Hemoglobin: 13.4 g/dL (ref 12.0–15.0)
Immature Granulocytes: 1 %
Lymphocytes Relative: 14 %
Lymphs Abs: 1.5 10*3/uL (ref 0.7–4.0)
MCH: 29.6 pg (ref 26.0–34.0)
MCHC: 32.2 g/dL (ref 30.0–36.0)
MCV: 92 fL (ref 80.0–100.0)
Monocytes Absolute: 0.8 10*3/uL (ref 0.1–1.0)
Monocytes Relative: 8 %
Neutro Abs: 6.8 10*3/uL (ref 1.7–7.7)
Neutrophils Relative %: 62 %
Platelets: 314 10*3/uL (ref 150–400)
RBC: 4.52 MIL/uL (ref 3.87–5.11)
RDW: 13.3 % (ref 11.5–15.5)
WBC: 10.8 10*3/uL — ABNORMAL HIGH (ref 4.0–10.5)
nRBC: 0 % (ref 0.0–0.2)

## 2021-03-26 LAB — URINALYSIS, ROUTINE W REFLEX MICROSCOPIC
Bilirubin Urine: NEGATIVE
Glucose, UA: NEGATIVE mg/dL
Hgb urine dipstick: NEGATIVE
Ketones, ur: 5 mg/dL — AB
Leukocytes,Ua: NEGATIVE
Nitrite: NEGATIVE
Protein, ur: NEGATIVE mg/dL
Specific Gravity, Urine: 1.019 (ref 1.005–1.030)
pH: 5 (ref 5.0–8.0)

## 2021-03-26 LAB — CBG MONITORING, ED
Glucose-Capillary: 158 mg/dL — ABNORMAL HIGH (ref 70–99)
Glucose-Capillary: 130 mg/dL — ABNORMAL HIGH (ref 70–99)

## 2021-03-26 MED ORDER — FENTANYL CITRATE PF 50 MCG/ML IJ SOSY
50.0000 ug | PREFILLED_SYRINGE | Freq: Once | INTRAMUSCULAR | Status: AC
  Administered 2021-03-26: 50 ug via INTRAVENOUS
  Filled 2021-03-26: qty 1

## 2021-03-26 MED ORDER — METHOCARBAMOL 500 MG PO TABS: 500.0000 mg | ORAL_TABLET | Freq: Two times a day (BID) | ORAL | 0 refills | Status: AC | PRN

## 2021-03-26 MED ORDER — NAPROXEN 500 MG PO TABS: 500.0000 mg | ORAL_TABLET | Freq: Two times a day (BID) | ORAL | 0 refills | Status: AC

## 2021-03-26 NOTE — ED Provider Notes (Signed)
[facility] Provider Note   CSN: [id number] Arrival date & time: 03/26/21  1551     History Chief Complaint  Patient presents with   Lytle Michaels    Barbara [name] is a 80 y.o. female.   Fall  This patient is a 80 year old female presenting to the hospital with significant and worsening back pain which has become quite immobilizing for the patient.  According to the family member at the bedside who is the primary historian due to the patient's memory loss and possible dementia the patient has had multiple falls over time she has had prior fractures of her hip because of a fall and has had a admission to the hospital in the last year for diabetic ketoacidosis during which time she spent a lot of time on a ventilator.  She reports that there was another recent fall, she has had difficulty ambulating and could not get out of bed this morning because of severe back pain.  She denies any numbness or weakness of the legs, she has had very poor blood flow to her legs and is supposed to be being worked up with ultrasounds of her legs for blood flow characterization thinking that she had peripheral vascular disease.  She does not have any leg pain at this time.  The patient is somewhat confused regarding history, level 5 caveat applies secondary to chronic confusion    Past Medical History:  Diagnosis Date   Arteriosclerotic cardiovascular disease (ASCVD)    non-obstructive; negative stress nuclear and normal echo in 08/[facility] in 03/2000-20%  main 20% LAD, 30% circumflex, 30% RCA, hyperdynamic LV   Asthma    Mild   Chest pain    none recent   Chronic back pain    Diabetes mellitus    type 2   GERD (gastroesophageal reflux disease)    Glaucoma    both eyes   Hyperlipidemia    total cholesterol of 271 and LDL of 187 in 07/2010   Hypertension    Macular degeneration    PAF (paroxysmal atrial fibrillation) ([place])    Palpitations    Stroke  [facility]) 1993 2008   short term memory loss   Tobacco abuse     Patient Active Problem List   Diagnosis Date Noted   Closed fracture of lower end of left femur with routine healing 11/09/2019   Closed displaced supracondylar fracture of distal end of left femur with intracondylar extension ([place]) 11/03/2018   Elevated troponin 11/03/2018   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   Debility 10/10/2017   Postoperative pain    PAF (paroxysmal atrial fibrillation) (HCC)    Diabetes mellitus type 2 in nonobese [facility])    History of GI bleed    Diabetic acidosis without coma (HCC)    Wide Complex Tachycardia/H/o SVT and H/o Afib 09/29/2017   Sepsis ([place]) 09/29/2017   Colonic diverticular abscess 09/27/2017   Diverticulitis of both large and small intestine with perforation and abscess without bleeding 09/24/2017   Unspecified atrial fibrillation ([place]) 09/23/2017   Dizziness 07/28/2015   Orthostatic hypotension 07/28/2015   Rectal bleeding 12/05/2013   Melena 12/05/2013   Personal history of colonic polyps 12/05/2013   Rapid atrial fibrillation ([place]) 08/15/2013   Atrial fibrillation with RVR ([place]) 08/15/2013   Right upper quadrant pain 11/16/2011   Abdominal pain, acute, left lower quadrant 09/01/2011   Cerebrovascular disease 10/09/2010   Hypertension    Hyperlipidemia    Diabetes mellitus ([place])  Arteriosclerotic cardiovascular disease (ASCVD)    Macular degeneration    Tobacco abuse    Asthma    Palpitations    Chest pain     Past Surgical History:  Procedure Laterality Date   APPENDECTOMY     CATARACT EXTRACTION     left   COLECTOMY WITH COLOSTOMY CREATION/HARTMANN PROCEDURE Left 09/29/2017   Procedure: EXTENDED LEFT HEMI COLECTOMY WITH COLOSTOMY CREATION (HARTMANN PROCEDURE);  Surgeon: Greer Pickerel, MD;  Location: [facility];  Service: General;  Laterality: Left;   COLONOSCOPY  2009   COLONOSCOPY  03/30/2012   Procedure: COLONOSCOPY;  Surgeon: Rogene Houston, MD;  [facility];  Service: Endoscopy;  Laterality: N/A;  730   COLONOSCOPY N/A 12/13/2013   Procedure: COLONOSCOPY;  Surgeon: Rogene Houston, MD;  [facility];  Service: Endoscopy;  Laterality: N/A;  1200   COLONOSCOPY N/A 05/25/2018   Procedure: COLONOSCOPY THROUGH OSTOMY;  Surgeon: Leighton Ruff, MD;  [facility];  Service: Endoscopy;  Laterality: N/A;   COLOSTOMY TAKEDOWN N/A 05/26/2018   Procedure: LAPAROSCOPIC COLOSTOMY REVERSAL ERAS PATHWAY;  Surgeon: Leighton Ruff, MD;  Location[facility];  Service: General;  Laterality: N/A;   ESOPHAGOGASTRODUODENOSCOPY  10/21/2011   Procedure: ESOPHAGOGASTRODUODENOSCOPY (EGD);  Surgeon: Rogene Houston, MD;  [facility];  Service: Endoscopy;  Laterality: N/A;  1200   ESOPHAGOGASTRODUODENOSCOPY N/A 12/13/2013   Procedure: ESOPHAGOGASTRODUODENOSCOPY (EGD);  Surgeon: Rogene Houston, MD;  [facility];  Service: Endoscopy;  Laterality: N/A;   LAPAROTOMY N/A 09/29/2017   Procedure: EXPLORATORY LAPAROTOMY;  Surgeon: Greer Pickerel, MD;  Location: [facility];  Service: General;  Laterality: N/A;   ORIF FEMUR FRACTURE Left 11/04/2018   Procedure: OPEN REDUCTION INTERNAL FIXATION (ORIF) DISTAL FEMUR FRACTURE;  Surgeon: Shona Needles, MD;  Location: [place];  Service: Orthopedics;  Laterality: Left;   POLYPECTOMY  05/25/2018   Procedure: POLYPECTOMY;  Surgeon: Leighton Ruff, MD;  [facility];  Service: Endoscopy;;   TUBAL LIGATION       OB History   No obstetric history on file.     Family History  Problem Relation Age of Onset   Heart attack Mother    Cancer Father    Cancer Sister    Breast cancer Sister    Heart attack Brother    Cancer Brother    Cancer Sister    Breast cancer Sister    Cancer Sister     Social History   Tobacco Use   Smoking status: Every Day    Packs/day: 0.50    Years: 50.00    Pack years: 25.00    Types: Cigarettes    Last attempt to quit: 10/06/2017    Years since quitting: 3.4    Smokeless tobacco: Never  Vaping Use   Vaping Use: Never used  Substance Use Topics   Alcohol use: No   Drug use: No    Home Medications Prior to Admission medications   Medication Sig Start Date End Date Taking? Authorizing Provider  alendronate (FOSAMAX) 70 MG tablet Take 70 mg by mouth once a week. 03/23/21  Yes [provider]  amiodarone (PACERONE) 200 MG tablet Take 200 mg by mouth daily. 03/18/21  Yes [provider]  Ascorbic Acid (VITAMIN C) 1000 MG tablet Take 1,000 mg by mouth daily.    Yes [provider]  baclofen (LIORESAL) 10 MG tablet Take 5 mg by mouth 2 (two) times daily. 03/05/19  Yes [provider]  Biotin 1000 MCG tablet Take 1,000 mcg by mouth daily.    Yes [provider]  brimonidine (ALPHAGAN) 0.2 % ophthalmic solution Place 1 drop into both eyes 3 (three) times daily.  01/31/15  Yes [provider]  Cyanocobalamin (VITAMIN B-12) 2500 MCG SUBL Place 2,500 mcg under the tongue daily.   Yes [provider]  DULoxetine (CYMBALTA) 20 MG capsule Take 20 mg by mouth daily.   Yes [provider]  furosemide (LASIX) 20 MG tablet Take by mouth.   Yes [provider]  gabapentin (NEURONTIN) 600 MG tablet Take 300 mg by mouth at bedtime.   Yes [provider]  HM VITAMIN D3 100 MCG (4000 UT) CAPS Take 1 capsule by mouth daily. 12/26/18  Yes [provider]  Insulin Glargine (LANTUS) 100 UNIT/ML Solostar Pen Inject 14 Units into the skin daily at 10 pm. 10/20/17  Yes Angiulli, Lavon Paganini, PA-C  insulin regular (NOVOLIN R,HUMULIN R) 100 units/mL injection Inject 6 Units into the skin 3 (three) times daily before meals. As directed per sliding scale   Yes [provider]  methocarbamol (ROBAXIN) 500 MG tablet Take 1 tablet (500 mg total) by mouth 2 (two) times daily as needed for muscle spasms. 03/26/21  Yes Noemi Chapel, MD  metoprolol tartrate (LOPRESSOR) 25 MG tablet Take 0.5  tablets (12.5 mg total) by mouth 2 (two) times daily. 10/20/17  Yes Angiulli, Lavon Paganini, PA-C  Multiple Vitamins-Minerals (PRESERVISION AREDS 2) CAPS Take 1 capsule by mouth 2 (two) times daily.   Yes [provider]  naproxen (NAPROSYN) 500 MG tablet Take 1 tablet (500 mg total) by mouth 2 (two) times daily with a meal. 03/26/21  Yes Noemi Chapel, MD  nitroGLYCERIN (NITROSTAT) 0.4 MG SL tablet Place 1 tablet (0.4 mg total) under the tongue every 5 (five) minutes x 3 doses as needed for chest pain. 05/04/19  Yes Shirley Friar, PA-C  pantoprazole (PROTONIX) 40 MG tablet Take by mouth. 01/13/21  Yes [provider]  Polyethyl Glycol-Propyl Glycol 0.4-0.3 % SOLN Place 1 drop into both eyes 2 (two) times daily.    Yes [provider]  potassium chloride (KLOR-CON) 20 MEQ packet Take 20 mEq by mouth daily.   Yes [provider]  senna-docusate (SENOKOT-S) 8.6-50 MG tablet Take 1 tablet by mouth daily.   Yes [provider]  traMADol (ULTRAM) 50 MG tablet Take 50 mg by mouth every 6 (six) hours as needed for moderate pain. 02/27/21  Yes [provider]  aspirin 325 MG tablet Take 0.5 tablets (162.5 mg total) by mouth at bedtime. Restart after completing Lovenox Patient not taking: No sig reported 11/08/18   Mariel Aloe, MD  flecainide (TAMBOCOR) 100 MG tablet Take 1 tablet (100 mg total) by mouth 2 (two) times daily. Patient not taking: No sig reported 11/28/20   Deboraha Sprang, MD    Allergies    Bee venom, Hydroxyzine, Neomycin, Latex, Penicillins, Sulfamethoxazole, and Sulfonamide derivatives  Review of Systems   Review of Systems  Unable to perform ROS: Mental status change   Physical Exam Updated Vital Signs BP (!) 143/55   Pulse (!) 47   Temp 98.3 F (36.8 C) (Oral)   Resp 19   SpO2 95%   Physical Exam Vitals and nursing note reviewed.  Constitutional:      General: She is not in acute distress.    Appearance: She is  well-developed.  HENT:  Head: Normocephalic and atraumatic.     Mouth/Throat:     Pharynx: No oropharyngeal exudate.  Eyes:     General: No scleral icterus.       Right eye: No discharge.        Left eye: No discharge.     Conjunctiva/sclera: Conjunctivae normal.     Pupils: Pupils are equal, round, and reactive to light.  Neck:     Thyroid: No thyromegaly.     Vascular: No JVD.  Cardiovascular:     Rate and Rhythm: Normal rate and regular rhythm.     Heart sounds: Normal heart sounds. No murmur heard.   No friction rub. No gallop.  Pulmonary:     Effort: Pulmonary effort is normal. No respiratory distress.     Breath sounds: Normal breath sounds. No wheezing or rales.  Abdominal:     General: Bowel sounds are normal. There is no distension.     Palpations: Abdomen is soft. There is no mass.     Tenderness: There is no abdominal tenderness.  Musculoskeletal:        General: Tenderness present. Normal range of motion.     Cervical back: Normal range of motion and neck supple.     Comments: No deformity of the arms or the legs, the legs have very poor blood flow based on their slightly mottled appearance, capillary refill is about 5 seconds, pulses are nonpalpable.  There is tenderness to palpation over the lower thoracic mid thoracic and to a lesser degree the lumbar spine.  There is no paraspinal tenderness and no rib tenderness  Lymphadenopathy:     Cervical: No cervical adenopathy.  Skin:    General: Skin is warm and dry.     Findings: No erythema or rash.  Neurological:     Mental Status: She is alert.     Coordination: Coordination normal.     Comments: The patient is slightly confused but able to follow all my commands.  She has significant difficulty even getting from the wheelchair to the bed without 3 people helping lift her.  She cannot stand by herself  Psychiatric:        Behavior: Behavior normal.    ED Results / Procedures / Treatments   Labs (all labs  ordered are listed, but only abnormal results are displayed) Labs Reviewed  CBC WITH DIFFERENTIAL/PLATELET - Abnormal; Notable for the following components:      Result Value   WBC 10.8 (*)    Eosinophils Absolute 1.5 (*)    All other components within normal limits  COMPREHENSIVE METABOLIC PANEL - Abnormal; Notable for the following components:   Glucose, Bld 183 (*)    Creatinine, Ser 1.06 (*)    GFR, Estimated 54 (*)    All other components within normal limits  URINALYSIS, ROUTINE W REFLEX MICROSCOPIC - Abnormal; Notable for the following components:   Ketones, ur 5 (*)    All other components within normal limits  CBG MONITORING, ED - Abnormal; Notable for the following components:   Glucose-Capillary 158 (*)    All other components within normal limits  CBG MONITORING, ED - Abnormal; Notable for the following components:   Glucose-Capillary 130 (*)    All other components within normal limits    EKG EKG Interpretation  Date/Time:  Thursday March 26 2021 16:24:56 EDT Ventricular Rate:  51 PR Interval:    QRS Duration: 79 QT Interval:  608 QTC Calculation: 561 R Axis:   74 Text  Interpretation: Junctional rhythm Borderline repolarization abnormality Prolonged QT interval Since last tracing QT prolonged Confirmed by Noemi Chapel [phone]) on 03/26/2021 5:33:13 PM  Radiology DG Chest 1 View  Result Date: 03/26/2021 CLINICAL DATA:  Fall, generalized back pain EXAM: CHEST  1 VIEW COMPARISON:  12/28/2020 FINDINGS: Heart is upper limits normal in size. Aortic calcifications. No confluent airspace opacities, effusions or edema. No acute bony abnormality. IMPRESSION: No active disease. Electronically Signed   By: Rolm Baptise M.D.   On: 03/26/2021 17:45   DG Thoracic Spine W/Swimmers  Result Date: 03/26/2021 CLINICAL DATA:  Fall, back pain EXAM: THORACIC SPINE - 3 VIEWS COMPARISON:  Chest x-ray today.  Thoracic spine images [id number] FINDINGS: No fracture or focal bone lesion.  Degenerative changes throughout the thoracic spine. Aortic atherosclerosis. IMPRESSION: No acute bony abnormality. Electronically Signed   By: Rolm Baptise M.D.   On: 03/26/2021 17:46   DG Lumbar Spine Complete  Result Date: 03/26/2021 CLINICAL DATA:  Fall, generalized back pain EXAM: LUMBAR SPINE - COMPLETE 4+ VIEW COMPARISON:  12/15/2020. FINDINGS: New compression fracture through the superior endplate of L2. Mild compression fracture through the inferior endplate of L4 appears progressed. No malalignment. Diffuse degenerative disc disease and facet disease. Diffuse osteopenia. IMPRESSION: New mild to moderate L2 compression fracture through the superior endplate. Slight progression of the compression fracture through the L4 inferior endplate. Electronically Signed   By: Rolm Baptise M.D.   On: 03/26/2021 17:36    Procedures Procedures   Medications Ordered in ED Medications  fentaNYL (SUBLIMAZE) injection 50 mcg (50 mcg Intravenous Given 03/26/21 1625)    ED Course  I have reviewed the triage vital signs and the nursing notes.  Pertinent labs & imaging results that were available during my care of the patient were reviewed by me and considered in my medical decision making (see chart for details).    MDM Rules/Calculators/A&P                           This patient presents to the ED for concern of possible new back fractures, this involves an extensive number of treatment options, and is a complaint that carries with it a high risk of complications and morbidity.  The differential diagnosis includes back fractures, electrolyte disturbance, glucose abnormalities, diabetic disturbances, urinary tract infection   Lab Tests:  I Ordered, reviewed, and interpreted labs, which included CBC, metabolic panel, urinalysis, CBG  Medicines ordered:  I ordered medication fentanyl for back pain  Imaging Studies ordered:  I ordered imaging studies which included thoracic and lumbar x-rays  and I independently visualized and interpreted imaging which showed an L2 compression fracture  Additional history obtained:  Additional history obtained from family members in the medical record Previous records obtained and reviewed at length  Consultations Obtained:  I consulted with the family and the patient and discussed lab and imaging findings  Reevaluation:  After the interventions stated above, I reevaluated the patient and found her to have increasing pain control, no neurologic symptoms in the legs  Critical Interventions:  TLSO brace was ordered, I discussed the case with the family members, the patient wants to go home, she has her medical decision-making capacity at this time, she is willing to try to walk with a walker which she has at home, she has family members that will continue to come and help her.  The family members will try to get her into her doctor to work  on assisted living placement if the patient is willing to do that but at this time she has not.  Final Clinical Impression(s) / ED Diagnoses Final diagnoses:  Closed compression fracture of L2 lumbar vertebra, initial encounter [facility])    Rx / DC Orders ED Discharge Orders          Ordered    naproxen (NAPROSYN) 500 MG tablet  2 times daily with meals        03/26/21 2315    methocarbamol (ROBAXIN) 500 MG tablet  2 times daily PRN        03/26/21 2315             Noemi Chapel, MD 03/26/21 2316

## 2021-03-26 NOTE — ED Notes (Signed)
Patient transported to X-ray 

## 2021-03-26 NOTE — ED Notes (Signed)
ED Provider at bedside. 

## 2021-03-26 NOTE — Discharge Instructions (Signed)
You should wear your brace at all times if possible but it is most important if you are up and trying to walk around or move around.  Your x-rays do show that you have a broken bone of your L2 vertebrae in your back.  I would encourage you to take the pain medication which I have given you called Naprosyn, you may take this twice a day for pain, I have also given you a muscle relaxer in case you are having muscle spasms in your lower back.  I would like for you to return to the emergency department for severe worsening symptoms however the most important thing you can do is to talk to Dr. Willey Blade about helping you to get placed into a long-term care facility for a rehabilitation with your back fracture.  Dr. Willey Blade may also need to refer you to orthopedics if your pain is continuing as sometimes they can do a procedure to help the bone get back to its normal size by injecting some cement into it.  This is called kyphoplasty.

## 2021-03-26 NOTE — ED Notes (Signed)
Pt transported to Xray, pt ended up removing the device.

## 2021-03-26 NOTE — ED Triage Notes (Signed)
Patient has multiple falls, fell several times in the last few days

## 2021-03-26 NOTE — ED Notes (Signed)
Pedal pulses found by portable doppler

## 2021-03-26 NOTE — Progress Notes (Signed)
Orthopedic Tech Progress Note Patient Details:  Barbara [name] 1940-06-11 [id number] Ordered TLSO from hanger Patient ID: Ranae Palms, female   DOB: 1940/09/01, 80 y.o.   MRN: [id number]  [name] 03/26/2021, 11:55 PM

## 2021-03-26 NOTE — ED Notes (Signed)
Xrays not done due to pt refused to remove diabetic device.

## 2021-03-27 LAB — CBG MONITORING, ED
Glucose-Capillary: 231 mg/dL — ABNORMAL HIGH (ref 70–99)
Glucose-Capillary: 25 mg/dL — CL (ref 70–99)
Glucose-Capillary: 30 mg/dL — CL (ref 70–99)

## 2021-03-27 MED ORDER — GLUCAGON HCL RDNA (DIAGNOSTIC) 1 MG IJ SOLR
INTRAMUSCULAR | Status: AC
  Filled 2021-03-27: qty 1

## 2021-03-27 MED ORDER — DEXTROSE 50 % IV SOLN
50.0000 mL | Freq: Once | INTRAVENOUS | Status: AC
  Administered 2021-03-27: 50 mL via INTRAVENOUS

## 2021-03-27 MED ORDER — HYDROCODONE-ACETAMINOPHEN 5-325 MG PO TABS
1.0000 | ORAL_TABLET | Freq: Once | ORAL | Status: AC
  Administered 2021-03-27: 1 via ORAL
  Filled 2021-03-27: qty 1

## 2021-03-27 MED ORDER — STERILE WATER FOR INJECTION IJ SOLN
INTRAMUSCULAR | Status: AC
  Filled 2021-03-27: qty 10

## 2021-03-27 MED ORDER — DEXTROSE 50 % IV SOLN
INTRAVENOUS | Status: AC
  Filled 2021-03-27: qty 50

## 2021-03-27 NOTE — ED Provider Notes (Addendum)
Patient was getting ready for discharge when she is felt like her blood sugar was low.  Capillary blood glucose was 25.  After giving her a snack, glucose had only come up to 30.  She is given an amp of dextrose.   Delora Fuel, MD 11/21/14 0108  Following dextrose, glucose is up to 231.  She will be discharged and is encouraged to eat another snack at home before going to sleep.   Delora Fuel, MD 06/01/30 [phone]

## 2021-03-27 NOTE — ED Notes (Signed)
Equipment rep at bedside to apply TLSO brace.

## 2021-03-27 NOTE — ED Notes (Signed)
Pt asking for pain medication, MD notified.

## 2021-03-27 NOTE — ED Notes (Signed)
TLSO brace applied by equipment rep.  Pt tolerated well, states it improves her pain.  Pt states she feels like her blood sugar is low, FSBS checked, 25.  Pt given boost and graham crackers. FSBS checked approximately 56min later, 30.  Dr. Roxanne Mins notified and orders obtained.  Pt given D50, fsbs improved to 231.  Other VSS. Pt approved for discharge, family aware of need to check FSBS and eat a meal.  Nonadherent dressings and gauze applied to pt's bilateral heel wounds.  Pt assisted into wheelchair by staff x 2 with difficulty.  Discharge instructions discussed via phone by Dr. Sabra Heck and daughter in law previously.  Discharge instructions discussed with pt and son, both verbalized understanding.  Pt assisted to vehicle.

## 2021-04-15 DIAGNOSIS — I679 Cerebrovascular disease, unspecified: Secondary | ICD-10-CM | POA: Diagnosis not present

## 2021-04-15 DIAGNOSIS — Z8673 Personal history of transient ischemic attack (TIA), and cerebral infarction without residual deficits: Secondary | ICD-10-CM | POA: Diagnosis not present

## 2021-04-15 DIAGNOSIS — I739 Peripheral vascular disease, unspecified: Secondary | ICD-10-CM | POA: Diagnosis not present

## 2021-04-15 DIAGNOSIS — F0390 Unspecified dementia without behavioral disturbance: Secondary | ICD-10-CM | POA: Diagnosis not present

## 2021-04-23 DEATH — deceased

## 2021-04-27 ENCOUNTER — Ambulatory Visit: Payer: Medicare Other | Admitting: Internal Medicine
